# Patient Record
Sex: Male | Born: 1960 | Race: White | Hispanic: No | Smoking: Former smoker
Health system: Southern US, Community
[De-identification: ages and names within clinical notes are randomized; demographics above are authoritative.]

## PROBLEM LIST (undated history)

## (undated) DIAGNOSIS — G35 Multiple sclerosis: Secondary | ICD-10-CM

## (undated) DIAGNOSIS — G8250 Quadriplegia, unspecified: Secondary | ICD-10-CM

## (undated) DIAGNOSIS — F32A Depression, unspecified: Secondary | ICD-10-CM

## (undated) DIAGNOSIS — J189 Pneumonia, unspecified organism: Secondary | ICD-10-CM

## (undated) DIAGNOSIS — R339 Retention of urine, unspecified: Secondary | ICD-10-CM

## (undated) DIAGNOSIS — M549 Dorsalgia, unspecified: Secondary | ICD-10-CM

## (undated) DIAGNOSIS — R32 Unspecified urinary incontinence: Secondary | ICD-10-CM

## (undated) DIAGNOSIS — F329 Major depressive disorder, single episode, unspecified: Secondary | ICD-10-CM

## (undated) DIAGNOSIS — Z960 Presence of urogenital implants: Secondary | ICD-10-CM

## (undated) DIAGNOSIS — E785 Hyperlipidemia, unspecified: Secondary | ICD-10-CM

## (undated) DIAGNOSIS — Z87442 Personal history of urinary calculi: Secondary | ICD-10-CM

## (undated) DIAGNOSIS — R5382 Chronic fatigue, unspecified: Secondary | ICD-10-CM

## (undated) DIAGNOSIS — K592 Neurogenic bowel, not elsewhere classified: Secondary | ICD-10-CM

## (undated) DIAGNOSIS — B379 Candidiasis, unspecified: Secondary | ICD-10-CM

## (undated) DIAGNOSIS — F419 Anxiety disorder, unspecified: Secondary | ICD-10-CM

## (undated) DIAGNOSIS — E039 Hypothyroidism, unspecified: Secondary | ICD-10-CM

## (undated) DIAGNOSIS — I10 Essential (primary) hypertension: Secondary | ICD-10-CM

## (undated) DIAGNOSIS — Z978 Presence of other specified devices: Secondary | ICD-10-CM

## (undated) DIAGNOSIS — N210 Calculus in bladder: Secondary | ICD-10-CM

## (undated) DIAGNOSIS — R269 Unspecified abnormalities of gait and mobility: Secondary | ICD-10-CM

## (undated) DIAGNOSIS — K219 Gastro-esophageal reflux disease without esophagitis: Secondary | ICD-10-CM

## (undated) DIAGNOSIS — N319 Neuromuscular dysfunction of bladder, unspecified: Secondary | ICD-10-CM

## (undated) DIAGNOSIS — G8929 Other chronic pain: Secondary | ICD-10-CM

## (undated) HISTORY — PX: APPENDECTOMY: SHX54

## (undated) HISTORY — DX: Unspecified abnormalities of gait and mobility: R26.9

## (undated) HISTORY — DX: Gastro-esophageal reflux disease without esophagitis: K21.9

## (undated) HISTORY — DX: Multiple sclerosis: G35

## (undated) HISTORY — DX: Essential (primary) hypertension: I10

## (undated) HISTORY — DX: Quadriplegia, unspecified: G82.50

## (undated) HISTORY — DX: Hypothyroidism, unspecified: E03.9

## (undated) HISTORY — DX: Unspecified urinary incontinence: R32

---

## 1994-09-02 HISTORY — PX: NASAL SEPTUM SURGERY: SHX37

## 1994-09-02 HISTORY — PX: OTHER SURGICAL HISTORY: SHX169

## 2003-06-08 ENCOUNTER — Encounter: Payer: Self-pay | Admitting: Cardiology

## 2003-06-08 ENCOUNTER — Ambulatory Visit: Admission: RE | Admit: 2003-06-08 | Discharge: 2003-06-08 | Payer: Self-pay | Admitting: Neurology

## 2003-06-27 ENCOUNTER — Ambulatory Visit (HOSPITAL_COMMUNITY): Admission: RE | Admit: 2003-06-27 | Discharge: 2003-06-27 | Payer: Self-pay | Admitting: Neurology

## 2003-06-28 ENCOUNTER — Ambulatory Visit (HOSPITAL_COMMUNITY): Admission: RE | Admit: 2003-06-28 | Discharge: 2003-06-28 | Payer: Self-pay | Admitting: Neurology

## 2003-09-29 ENCOUNTER — Ambulatory Visit (HOSPITAL_COMMUNITY): Admission: RE | Admit: 2003-09-29 | Discharge: 2003-09-29 | Payer: Self-pay | Admitting: Neurology

## 2003-12-22 ENCOUNTER — Encounter (HOSPITAL_COMMUNITY): Admission: RE | Admit: 2003-12-22 | Discharge: 2004-03-21 | Payer: Self-pay | Admitting: Neurology

## 2004-01-19 ENCOUNTER — Encounter: Admission: RE | Admit: 2004-01-19 | Discharge: 2004-04-18 | Payer: Self-pay | Admitting: Neurology

## 2004-02-07 ENCOUNTER — Ambulatory Visit: Admission: RE | Admit: 2004-02-07 | Discharge: 2004-02-07 | Payer: Self-pay | Admitting: Neurology

## 2004-02-07 ENCOUNTER — Encounter (INDEPENDENT_AMBULATORY_CARE_PROVIDER_SITE_OTHER): Payer: Self-pay | Admitting: Cardiology

## 2004-02-07 HISTORY — PX: TRANSTHORACIC ECHOCARDIOGRAM: SHX275

## 2006-06-20 ENCOUNTER — Encounter: Admission: RE | Admit: 2006-06-20 | Discharge: 2006-06-20 | Payer: Self-pay | Admitting: Neurology

## 2006-06-21 ENCOUNTER — Encounter: Admission: RE | Admit: 2006-06-21 | Discharge: 2006-06-21 | Payer: Self-pay | Admitting: Neurology

## 2006-06-30 ENCOUNTER — Encounter (HOSPITAL_COMMUNITY): Admission: RE | Admit: 2006-06-30 | Discharge: 2006-09-28 | Payer: Self-pay | Admitting: Neurology

## 2006-10-16 ENCOUNTER — Encounter (HOSPITAL_COMMUNITY): Admission: RE | Admit: 2006-10-16 | Discharge: 2007-01-14 | Payer: Self-pay | Admitting: Neurology

## 2007-02-06 ENCOUNTER — Encounter (HOSPITAL_COMMUNITY): Admission: RE | Admit: 2007-02-06 | Discharge: 2007-05-07 | Payer: Self-pay | Admitting: Neurology

## 2007-06-02 ENCOUNTER — Encounter (HOSPITAL_COMMUNITY): Admission: RE | Admit: 2007-06-02 | Discharge: 2007-08-31 | Payer: Self-pay | Admitting: Neurology

## 2007-09-24 ENCOUNTER — Encounter (HOSPITAL_COMMUNITY): Admission: RE | Admit: 2007-09-24 | Discharge: 2007-12-23 | Payer: Self-pay | Admitting: Neurology

## 2008-01-15 ENCOUNTER — Encounter (HOSPITAL_COMMUNITY): Admission: RE | Admit: 2008-01-15 | Discharge: 2008-04-14 | Payer: Self-pay | Admitting: Neurology

## 2008-05-10 ENCOUNTER — Encounter (HOSPITAL_COMMUNITY): Admission: RE | Admit: 2008-05-10 | Discharge: 2008-08-08 | Payer: Self-pay | Admitting: Neurology

## 2008-08-09 ENCOUNTER — Encounter (HOSPITAL_COMMUNITY): Admission: RE | Admit: 2008-08-09 | Discharge: 2008-11-07 | Payer: Self-pay | Admitting: Neurology

## 2008-11-09 ENCOUNTER — Encounter (HOSPITAL_COMMUNITY): Admission: RE | Admit: 2008-11-09 | Discharge: 2009-02-07 | Payer: Self-pay | Admitting: Neurology

## 2008-11-16 ENCOUNTER — Encounter: Admission: RE | Admit: 2008-11-16 | Discharge: 2008-11-16 | Payer: Self-pay | Admitting: Neurology

## 2009-02-28 ENCOUNTER — Encounter (HOSPITAL_COMMUNITY): Admission: RE | Admit: 2009-02-28 | Discharge: 2009-05-29 | Payer: Self-pay | Admitting: Neurology

## 2009-06-20 ENCOUNTER — Encounter (HOSPITAL_COMMUNITY): Admission: RE | Admit: 2009-06-20 | Discharge: 2009-09-01 | Payer: Self-pay | Admitting: Neurology

## 2009-09-12 ENCOUNTER — Encounter (HOSPITAL_COMMUNITY): Admission: RE | Admit: 2009-09-12 | Discharge: 2009-12-11 | Payer: Self-pay | Admitting: Neurology

## 2009-11-20 ENCOUNTER — Encounter: Admission: RE | Admit: 2009-11-20 | Discharge: 2009-11-20 | Payer: Self-pay | Admitting: Neurology

## 2010-01-02 ENCOUNTER — Encounter (HOSPITAL_COMMUNITY): Admission: RE | Admit: 2010-01-02 | Discharge: 2010-03-27 | Payer: Self-pay | Admitting: Neurology

## 2010-03-27 ENCOUNTER — Encounter (HOSPITAL_COMMUNITY): Admission: RE | Admit: 2010-03-27 | Discharge: 2010-06-25 | Payer: Self-pay | Admitting: Neurology

## 2010-06-26 ENCOUNTER — Encounter (HOSPITAL_COMMUNITY)
Admission: RE | Admit: 2010-06-26 | Discharge: 2010-09-24 | Payer: Self-pay | Source: Home / Self Care | Attending: Neurology | Admitting: Neurology

## 2010-07-11 ENCOUNTER — Encounter: Admission: RE | Admit: 2010-07-11 | Discharge: 2010-07-11 | Payer: Self-pay | Admitting: Neurology

## 2010-10-17 ENCOUNTER — Ambulatory Visit (HOSPITAL_COMMUNITY)
Admission: RE | Admit: 2010-10-17 | Discharge: 2010-10-17 | Disposition: A | Discharge status: Home / Self Care | Payer: Medicare Other | Source: Ambulatory Visit | Attending: Neurology | Admitting: Neurology

## 2010-10-17 DIAGNOSIS — G35 Multiple sclerosis: Secondary | ICD-10-CM | POA: Insufficient documentation

## 2010-11-14 ENCOUNTER — Encounter (HOSPITAL_COMMUNITY)
Admission: RE | Admit: 2010-11-14 | Discharge: 2010-11-14 | Disposition: A | Discharge status: Home / Self Care | Payer: Medicare Other | Source: Ambulatory Visit | Attending: Neurology | Admitting: Neurology

## 2010-11-14 DIAGNOSIS — G35 Multiple sclerosis: Secondary | ICD-10-CM | POA: Insufficient documentation

## 2010-12-12 ENCOUNTER — Encounter (HOSPITAL_COMMUNITY): Payer: Medicare Other | Attending: Neurology

## 2010-12-12 DIAGNOSIS — G35 Multiple sclerosis: Secondary | ICD-10-CM | POA: Insufficient documentation

## 2011-01-09 ENCOUNTER — Encounter (HOSPITAL_COMMUNITY)
Admission: RE | Admit: 2011-01-09 | Discharge: 2011-01-09 | Disposition: A | Discharge status: Home / Self Care | Payer: Medicare Other | Source: Ambulatory Visit | Attending: Neurology | Admitting: Neurology

## 2011-01-09 DIAGNOSIS — G35 Multiple sclerosis: Secondary | ICD-10-CM | POA: Insufficient documentation

## 2011-02-06 ENCOUNTER — Encounter (HOSPITAL_COMMUNITY)
Admission: RE | Admit: 2011-02-06 | Discharge: 2011-02-06 | Disposition: A | Discharge status: Home / Self Care | Payer: Medicare Other | Source: Ambulatory Visit | Attending: Neurology | Admitting: Neurology

## 2011-02-06 DIAGNOSIS — G35 Multiple sclerosis: Secondary | ICD-10-CM | POA: Insufficient documentation

## 2011-03-07 ENCOUNTER — Encounter (HOSPITAL_COMMUNITY)
Admission: RE | Admit: 2011-03-07 | Discharge: 2011-03-07 | Disposition: A | Discharge status: Home / Self Care | Payer: Medicare Other | Source: Ambulatory Visit | Attending: Neurology | Admitting: Neurology

## 2011-03-07 DIAGNOSIS — G35 Multiple sclerosis: Secondary | ICD-10-CM | POA: Insufficient documentation

## 2011-04-10 ENCOUNTER — Encounter (HOSPITAL_COMMUNITY)
Admission: RE | Admit: 2011-04-10 | Discharge: 2011-04-10 | Disposition: A | Discharge status: Home / Self Care | Payer: Medicare Other | Source: Ambulatory Visit | Attending: Neurology | Admitting: Neurology

## 2011-04-10 DIAGNOSIS — G35 Multiple sclerosis: Secondary | ICD-10-CM | POA: Insufficient documentation

## 2011-05-08 ENCOUNTER — Encounter (HOSPITAL_COMMUNITY)
Admission: RE | Admit: 2011-05-08 | Discharge: 2011-05-08 | Disposition: A | Discharge status: Home / Self Care | Payer: Medicare Other | Source: Ambulatory Visit | Attending: Neurology | Admitting: Neurology

## 2011-05-08 DIAGNOSIS — G35 Multiple sclerosis: Secondary | ICD-10-CM | POA: Insufficient documentation

## 2011-06-05 ENCOUNTER — Encounter (HOSPITAL_COMMUNITY)
Admission: RE | Admit: 2011-06-05 | Discharge: 2011-06-05 | Disposition: A | Discharge status: Home / Self Care | Payer: Medicare Other | Source: Ambulatory Visit | Attending: Neurology | Admitting: Neurology

## 2011-06-05 DIAGNOSIS — G35 Multiple sclerosis: Secondary | ICD-10-CM | POA: Insufficient documentation

## 2011-07-03 ENCOUNTER — Encounter (HOSPITAL_COMMUNITY)
Admission: RE | Admit: 2011-07-03 | Discharge: 2011-07-03 | Payer: Medicare Other | Source: Ambulatory Visit | Attending: Neurology | Admitting: Neurology

## 2011-07-26 ENCOUNTER — Other Ambulatory Visit (HOSPITAL_COMMUNITY): Payer: Self-pay | Admitting: *Deleted

## 2011-07-26 MED ORDER — SODIUM CHLORIDE 0.9 % IV SOLN: INTRAVENOUS | Status: DC

## 2011-07-29 ENCOUNTER — Other Ambulatory Visit: Payer: Self-pay | Admitting: Neurology

## 2011-07-30 ENCOUNTER — Other Ambulatory Visit (HOSPITAL_COMMUNITY): Payer: Self-pay | Admitting: *Deleted

## 2011-07-30 ENCOUNTER — Encounter (HOSPITAL_COMMUNITY): Payer: Self-pay | Admitting: *Deleted

## 2011-07-30 MED ORDER — SODIUM CHLORIDE 0.9 % IV SOLN: 300.0000 mg | INTRAVENOUS | Status: DC

## 2011-07-30 MED ORDER — EPINEPHRINE 0.3 MG/0.3ML IJ DEVI: 0.3000 mg | INTRAMUSCULAR | Status: DC

## 2011-07-30 MED ORDER — ACETAMINOPHEN 500 MG PO TABS: 1000.0000 mg | ORAL_TABLET | ORAL | Status: DC

## 2011-07-30 MED ORDER — LORATADINE 10 MG PO TABS: 10.0000 mg | ORAL_TABLET | ORAL | Status: DC

## 2011-07-30 MED ORDER — SODIUM CHLORIDE 0.9 % IV SOLN: INTRAVENOUS | Status: DC

## 2011-07-30 MED ORDER — SODIUM CHLORIDE 0.9 % IV SOLN: 500.0000 mg | INTRAVENOUS | Status: DC

## 2011-07-31 ENCOUNTER — Encounter (HOSPITAL_COMMUNITY): Payer: Self-pay

## 2011-07-31 ENCOUNTER — Encounter (HOSPITAL_COMMUNITY)
Admission: RE | Admit: 2011-07-31 | Discharge: 2011-07-31 | Disposition: A | Discharge status: Home / Self Care | Payer: Medicare Other | Source: Ambulatory Visit | Attending: Neurology | Admitting: Neurology

## 2011-07-31 DIAGNOSIS — G35 Multiple sclerosis: Secondary | ICD-10-CM | POA: Insufficient documentation

## 2011-07-31 MED ORDER — ACETAMINOPHEN 500 MG PO TABS
1000.0000 mg | ORAL_TABLET | Freq: Once | ORAL | Status: AC
Start: 2011-07-31 — End: 2011-07-31
  Administered 2011-07-31: 1000 mg via ORAL

## 2011-07-31 MED ORDER — SODIUM CHLORIDE 0.9 % IV SOLN
INTRAVENOUS | Status: DC
  Administered 2011-07-31: 10:00:00 via INTRAVENOUS

## 2011-07-31 MED ORDER — SODIUM CHLORIDE 0.9 % IV SOLN
300.0000 mg | INTRAVENOUS | Status: DC
  Administered 2011-07-31: 300 mg via INTRAVENOUS
  Filled 2011-07-31: qty 15

## 2011-07-31 MED ORDER — LORATADINE 10 MG PO TABS
10.0000 mg | ORAL_TABLET | Freq: Once | ORAL | Status: AC
  Administered 2011-07-31: 10 mg via ORAL

## 2011-08-28 ENCOUNTER — Encounter (HOSPITAL_COMMUNITY): Admission: RE | Admit: 2011-08-28 | Payer: Medicare Other | Source: Ambulatory Visit

## 2011-08-30 ENCOUNTER — Encounter (HOSPITAL_COMMUNITY): Payer: Self-pay | Admitting: *Deleted

## 2011-08-30 ENCOUNTER — Other Ambulatory Visit (HOSPITAL_COMMUNITY): Payer: Self-pay | Admitting: *Deleted

## 2011-09-04 ENCOUNTER — Encounter (HOSPITAL_COMMUNITY)
Admission: RE | Admit: 2011-09-04 | Discharge: 2011-09-04 | Disposition: A | Discharge status: Home / Self Care | Payer: Medicare Other | Source: Ambulatory Visit | Attending: Neurology | Admitting: Neurology

## 2011-09-04 ENCOUNTER — Encounter (HOSPITAL_COMMUNITY): Payer: Self-pay

## 2011-09-04 DIAGNOSIS — G35 Multiple sclerosis: Secondary | ICD-10-CM | POA: Insufficient documentation

## 2011-09-04 MED ORDER — SODIUM CHLORIDE 0.9 % IV SOLN
300.0000 mg | INTRAVENOUS | Status: DC
  Administered 2011-09-04: 300 mg via INTRAVENOUS
  Filled 2011-09-04: qty 15

## 2011-09-04 MED ORDER — SODIUM CHLORIDE 0.9 % IV SOLN: INTRAVENOUS | Status: DC

## 2011-09-04 MED ORDER — LORATADINE 10 MG PO TABS
10.0000 mg | ORAL_TABLET | Freq: Once | ORAL | Status: AC
  Administered 2011-09-04: 10 mg via ORAL

## 2011-09-04 MED ORDER — ACETAMINOPHEN 500 MG PO TABS
1000.0000 mg | ORAL_TABLET | Freq: Once | ORAL | Status: AC
  Administered 2011-09-04: 1000 mg via ORAL
  Filled 2011-09-04: qty 2

## 2011-09-25 ENCOUNTER — Encounter (HOSPITAL_COMMUNITY): Payer: Medicare Other

## 2011-10-02 ENCOUNTER — Other Ambulatory Visit: Payer: Self-pay | Admitting: Neurology

## 2011-10-02 ENCOUNTER — Encounter (HOSPITAL_COMMUNITY)
Admission: RE | Admit: 2011-10-02 | Discharge: 2011-10-02 | Disposition: A | Discharge status: Home / Self Care | Payer: Medicare Other | Source: Ambulatory Visit | Attending: Neurology | Admitting: Neurology

## 2011-10-02 ENCOUNTER — Encounter (HOSPITAL_COMMUNITY): Payer: Self-pay

## 2011-10-02 MED ORDER — ACETAMINOPHEN 500 MG PO TABS: 1000.0000 mg | ORAL_TABLET | Freq: Once | ORAL | Status: AC

## 2011-10-02 MED ORDER — SODIUM CHLORIDE 0.9 % IV SOLN
300.0000 mg | INTRAVENOUS | Status: DC
  Administered 2011-10-02: 300 mg via INTRAVENOUS
  Filled 2011-10-02: qty 15

## 2011-10-02 MED ORDER — LORATADINE 10 MG PO TABS: 10.0000 mg | ORAL_TABLET | Freq: Once | ORAL | Status: DC

## 2011-10-02 MED ORDER — SODIUM CHLORIDE 0.9 % IV SOLN
INTRAVENOUS | Status: DC
  Administered 2011-10-02: 11:00:00 via INTRAVENOUS

## 2011-10-02 MED ORDER — LORATADINE 10 MG PO TABS
10.0000 mg | ORAL_TABLET | ORAL | Status: AC
  Administered 2011-10-02: 10 mg via ORAL

## 2011-10-02 MED ORDER — ACETAMINOPHEN 500 MG PO TABS
1000.0000 mg | ORAL_TABLET | ORAL | Status: AC
Start: 2011-10-02 — End: 2011-10-02
  Administered 2011-10-02: 1000 mg via ORAL

## 2011-10-23 ENCOUNTER — Encounter (HOSPITAL_COMMUNITY): Payer: Medicare Other

## 2011-10-28 ENCOUNTER — Other Ambulatory Visit: Payer: Self-pay | Admitting: Neurology

## 2011-10-28 MED ORDER — SODIUM CHLORIDE 0.9 % IV SOLN: 500.0000 mg | Freq: Once | INTRAVENOUS | Status: DC

## 2011-10-28 MED ORDER — EPINEPHRINE 0.3 MG/0.3ML IJ DEVI: 0.3000 mg | Freq: Once | INTRAMUSCULAR | Status: DC | PRN

## 2011-10-30 ENCOUNTER — Encounter (HOSPITAL_COMMUNITY)
Admission: RE | Admit: 2011-10-30 | Discharge: 2011-10-30 | Disposition: A | Discharge status: Home / Self Care | Payer: Medicare Other | Source: Ambulatory Visit | Attending: Neurology | Admitting: Neurology

## 2011-10-30 ENCOUNTER — Encounter (HOSPITAL_COMMUNITY): Payer: Self-pay

## 2011-10-30 DIAGNOSIS — G35 Multiple sclerosis: Secondary | ICD-10-CM | POA: Insufficient documentation

## 2011-10-30 MED ORDER — LORATADINE 10 MG PO TABS
10.0000 mg | ORAL_TABLET | ORAL | Status: DC
  Administered 2011-10-30: 10 mg via ORAL

## 2011-10-30 MED ORDER — ACETAMINOPHEN 500 MG PO TABS
1000.0000 mg | ORAL_TABLET | ORAL | Status: DC
  Administered 2011-10-30: 1000 mg via ORAL

## 2011-10-30 MED ORDER — SODIUM CHLORIDE 0.9 % IV SOLN
INTRAVENOUS | Status: DC
  Administered 2011-10-30: 250 mL via INTRAVENOUS

## 2011-10-30 MED ORDER — SODIUM CHLORIDE 0.9 % IV SOLN
300.0000 mg | INTRAVENOUS | Status: DC
  Administered 2011-10-30: 300 mg via INTRAVENOUS
  Filled 2011-10-30: qty 15

## 2011-11-20 ENCOUNTER — Encounter (HOSPITAL_COMMUNITY): Payer: Medicare Other

## 2011-11-26 ENCOUNTER — Other Ambulatory Visit: Payer: Self-pay | Admitting: Neurology

## 2011-11-27 ENCOUNTER — Encounter (HOSPITAL_COMMUNITY): Payer: Self-pay

## 2011-11-27 ENCOUNTER — Encounter (HOSPITAL_COMMUNITY)
Admission: RE | Admit: 2011-11-27 | Discharge: 2011-11-27 | Disposition: A | Discharge status: Home / Self Care | Payer: Medicare Other | Source: Ambulatory Visit | Attending: Neurology | Admitting: Neurology

## 2011-11-27 DIAGNOSIS — G35 Multiple sclerosis: Secondary | ICD-10-CM | POA: Insufficient documentation

## 2011-11-27 MED ORDER — LORATADINE 10 MG PO TABS
10.0000 mg | ORAL_TABLET | ORAL | Status: DC
  Administered 2011-11-27: 10 mg via ORAL

## 2011-11-27 MED ORDER — SODIUM CHLORIDE 0.9 % IV SOLN
INTRAVENOUS | Status: DC
  Administered 2011-11-27: 250 mL via INTRAVENOUS

## 2011-11-27 MED ORDER — ACETAMINOPHEN 500 MG PO TABS
1000.0000 mg | ORAL_TABLET | ORAL | Status: DC
  Administered 2011-11-27: 1000 mg via ORAL

## 2011-11-27 MED ORDER — SODIUM CHLORIDE 0.9 % IV SOLN
300.0000 mg | INTRAVENOUS | Status: DC
  Administered 2011-11-27: 300 mg via INTRAVENOUS
  Filled 2011-11-27: qty 15

## 2011-11-27 NOTE — Discharge Instructions (Signed)
Refer to printed paper for your next appointment. Short Stay Phone # [phone] °Refer to your Tysabri handbook and your MD for any questions or concerns °

## 2011-12-18 ENCOUNTER — Encounter (HOSPITAL_COMMUNITY): Payer: Medicare Other

## 2011-12-25 ENCOUNTER — Encounter (HOSPITAL_COMMUNITY): Payer: Self-pay

## 2011-12-25 ENCOUNTER — Encounter (HOSPITAL_COMMUNITY)
Admission: RE | Admit: 2011-12-25 | Discharge: 2011-12-25 | Disposition: A | Discharge status: Home / Self Care | Payer: Medicare Other | Source: Ambulatory Visit | Attending: Neurology | Admitting: Neurology

## 2011-12-25 DIAGNOSIS — G35 Multiple sclerosis: Secondary | ICD-10-CM | POA: Insufficient documentation

## 2011-12-25 MED ORDER — SODIUM CHLORIDE 0.9 % IV SOLN
INTRAVENOUS | Status: DC
  Administered 2011-12-25: 09:00:00 via INTRAVENOUS

## 2011-12-25 MED ORDER — ACETAMINOPHEN 500 MG PO TABS
1000.0000 mg | ORAL_TABLET | ORAL | Status: DC
  Administered 2011-12-25: 1000 mg via ORAL

## 2011-12-25 MED ORDER — LORATADINE 10 MG PO TABS
10.0000 mg | ORAL_TABLET | ORAL | Status: DC
Start: 2011-12-25 — End: 2011-12-26
  Administered 2011-12-25: 10 mg via ORAL

## 2011-12-25 MED ORDER — SODIUM CHLORIDE 0.9 % IV SOLN
300.0000 mg | INTRAVENOUS | Status: DC
  Administered 2011-12-25: 300 mg via INTRAVENOUS
  Filled 2011-12-25: qty 15

## 2011-12-25 MED ORDER — ACETAMINOPHEN 500 MG PO TABS
ORAL_TABLET | ORAL | Status: AC
  Administered 2011-12-25: 1000 mg via ORAL
  Filled 2011-12-25: qty 2

## 2011-12-25 MED ORDER — LORATADINE 10 MG PO TABS
ORAL_TABLET | ORAL | Status: AC
  Administered 2011-12-25: 10 mg via ORAL
  Filled 2011-12-25: qty 1

## 2011-12-25 NOTE — Discharge Instructions (Signed)
Natalizumab injection What is this medicine? NATALIZUMAB (na ta LIZ you mab) is used to treat relapsing multiple sclerosis. This drug is not a cure. It is also used to treat Crohn's disease. This medicine may be used for other purposes; ask your health care provider or pharmacist if you have questions. What should I tell my health care provider before I take this medicine? They need to know if you have any of these conditions: -immune system problems -progressive multifocal leukoencephalopathy (PML) -an unusual or allergic reaction to natalizumab, other medicines, foods, dyes, or preservatives -pregnant or trying to get pregnant -breast-feeding How should I use this medicine? This medicine is for infusion into a vein. It is given by a health care professional in a hospital or clinic setting. A special MedGuide will be given to you by the pharmacist with each prescription and refill. Be sure to read this information carefully each time. Talk to your pediatrician regarding the use of this medicine in children. This medicine is not approved for use in children. Overdosage: If you think you have taken too much of this medicine contact a poison control center or emergency room at once. NOTE: This medicine is only for you. Do not share this medicine with others. What if I miss a dose? It is important not to miss your dose. Call your doctor or health care professional if you are unable to keep an appointment. What may interact with this medicine? -azathioprine -cyclosporine -interferon -6-mercaptopurine -methotrexate -steroid medicines like prednisone or cortisone -TNF-alpha inhibitors like adalimumab, etanercept, and infliximab -vaccines This list may not describe all possible interactions. Give your health care provider a list of all the medicines, herbs, non-prescription drugs, or dietary supplements you use. Also tell them if you smoke, drink alcohol, or use illegal drugs. Some items may  interact with your medicine. What should I watch for while using this medicine? Your condition will be monitored carefully while you are receiving this medicine. Visit your doctor for regular check ups. Tell your doctor or healthcare professional if your symptoms do not start to get better or if they get worse. Stay away from people who are sick. Call your doctor or health care professional for advice if you get a fever, chills or sore throat, or other symptoms of a cold or flu. Do not treat yourself. In some patients, this medicine may cause a serious brain infection that may cause death. If you have any problems seeing, thinking, speaking, walking, or standing, tell your doctor right away. If you cannot reach your doctor, get urgent medical care. What side effects may I notice from receiving this medicine? Side effects that you should report to your doctor or health care professional as soon as possible: -allergic reactions like skin rash, itching or hives, swelling of the face, lips, or tongue -breathing problems -changes in vision -chest pain -dark urine -depression, feelings of sadness -dizziness -general ill feeling or flu-like symptoms -irregular, missed, or painful menstrual periods -light-colored stools -loss of appetite, nausea -muscle weakness -problems with balance, talking, or walking -right upper belly pain -unusually weak or tired -yellowing of the eyes or skin Side effects that usually do not require medical attention (report to your doctor or health care professional if they continue or are bothersome): -aches, pains -headache -stomach upset -tiredness This list may not describe all possible side effects. Call your doctor for medical advice about side effects. You may report side effects to FDA at 1-800-FDA-1088. Where should I keep my medicine? This drug   is given in a hospital or clinic and will not be stored at home. NOTE: This sheet is a summary. It may not cover  all possible information. If you have questions about this medicine, talk to your doctor, pharmacist, or health care provider.  2012, Elsevier/Gold Standard. (10/08/2008 1:33:21 PM) 

## 2012-01-22 ENCOUNTER — Encounter (HOSPITAL_COMMUNITY)
Admission: RE | Admit: 2012-01-22 | Discharge: 2012-01-22 | Disposition: A | Discharge status: Home / Self Care | Payer: Medicare Other | Source: Ambulatory Visit | Attending: Neurology | Admitting: Neurology

## 2012-01-22 ENCOUNTER — Encounter (HOSPITAL_COMMUNITY): Payer: Self-pay

## 2012-01-22 DIAGNOSIS — G35 Multiple sclerosis: Secondary | ICD-10-CM | POA: Insufficient documentation

## 2012-01-22 MED ORDER — LORATADINE 10 MG PO TABS
10.0000 mg | ORAL_TABLET | ORAL | Status: DC
  Administered 2012-01-22: 10 mg via ORAL

## 2012-01-22 MED ORDER — LORATADINE 10 MG PO TABS
ORAL_TABLET | ORAL | Status: AC
  Administered 2012-01-22: 10 mg via ORAL
  Filled 2012-01-22: qty 1

## 2012-01-22 MED ORDER — ACETAMINOPHEN 500 MG PO TABS
1000.0000 mg | ORAL_TABLET | ORAL | Status: DC
Start: 2012-01-22 — End: 2012-01-23
  Administered 2012-01-22: 975 mg via ORAL

## 2012-01-22 MED ORDER — ACETAMINOPHEN 325 MG PO TABS
ORAL_TABLET | ORAL | Status: AC
  Administered 2012-01-22: 975 mg via ORAL
  Filled 2012-01-22: qty 3

## 2012-01-22 MED ORDER — SODIUM CHLORIDE 0.9 % IV SOLN
INTRAVENOUS | Status: DC
  Administered 2012-01-22: 09:00:00 via INTRAVENOUS

## 2012-01-22 MED ORDER — SODIUM CHLORIDE 0.9 % IV SOLN
300.0000 mg | INTRAVENOUS | Status: DC
  Administered 2012-01-22: 300 mg via INTRAVENOUS
  Filled 2012-01-22: qty 15

## 2012-01-22 NOTE — Discharge Instructions (Signed)
Natalizumab injection What is this medicine? NATALIZUMAB (na ta LIZ you mab) is used to treat relapsing multiple sclerosis. This drug is not a cure. It is also used to treat Crohn's disease. This medicine may be used for other purposes; ask your health care provider or pharmacist if you have questions. What should I tell my health care provider before I take this medicine? They need to know if you have any of these conditions: -immune system problems -progressive multifocal leukoencephalopathy (PML) -an unusual or allergic reaction to natalizumab, other medicines, foods, dyes, or preservatives -pregnant or trying to get pregnant -breast-feeding How should I use this medicine? This medicine is for infusion into a vein. It is given by a health care professional in a hospital or clinic setting. A special MedGuide will be given to you by the pharmacist with each prescription and refill. Be sure to read this information carefully each time. Talk to your pediatrician regarding the use of this medicine in children. This medicine is not approved for use in children. Overdosage: If you think you have taken too much of this medicine contact a poison control center or emergency room at once. NOTE: This medicine is only for you. Do not share this medicine with others. What if I miss a dose? It is important not to miss your dose. Call your doctor or health care professional if you are unable to keep an appointment. What may interact with this medicine? -azathioprine -cyclosporine -interferon -6-mercaptopurine -methotrexate -steroid medicines like prednisone or cortisone -TNF-alpha inhibitors like adalimumab, etanercept, and infliximab -vaccines This list may not describe all possible interactions. Give your health care provider a list of all the medicines, herbs, non-prescription drugs, or dietary supplements you use. Also tell them if you smoke, drink alcohol, or use illegal drugs. Some items may  interact with your medicine. What should I watch for while using this medicine? Your condition will be monitored carefully while you are receiving this medicine. Visit your doctor for regular check ups. Tell your doctor or healthcare professional if your symptoms do not start to get better or if they get worse. Stay away from people who are sick. Call your doctor or health care professional for advice if you get a fever, chills or sore throat, or other symptoms of a cold or flu. Do not treat yourself. In some patients, this medicine may cause a serious brain infection that may cause death. If you have any problems seeing, thinking, speaking, walking, or standing, tell your doctor right away. If you cannot reach your doctor, get urgent medical care. What side effects may I notice from receiving this medicine? Side effects that you should report to your doctor or health care professional as soon as possible: -allergic reactions like skin rash, itching or hives, swelling of the face, lips, or tongue -breathing problems -changes in vision -chest pain -dark urine -depression, feelings of sadness -dizziness -general ill feeling or flu-like symptoms -irregular, missed, or painful menstrual periods -light-colored stools -loss of appetite, nausea -muscle weakness -problems with balance, talking, or walking -right upper belly pain -unusually weak or tired -yellowing of the eyes or skin Side effects that usually do not require medical attention (report to your doctor or health care professional if they continue or are bothersome): -aches, pains -headache -stomach upset -tiredness This list may not describe all possible side effects. Call your doctor for medical advice about side effects. You may report side effects to FDA at 1-800-FDA-1088. Where should I keep my medicine? This drug   is given in a hospital or clinic and will not be stored at home. NOTE: This sheet is a summary. It may not cover  all possible information. If you have questions about this medicine, talk to your doctor, pharmacist, or health care provider.  2012, Elsevier/Gold Standard. (10/08/2008 1:33:21 PM) 

## 2012-02-18 ENCOUNTER — Other Ambulatory Visit: Payer: Self-pay | Admitting: Neurology

## 2012-02-18 MED ORDER — EPINEPHRINE 0.3 MG/0.3ML IJ DEVI
0.3000 mg | Freq: Once | INTRAMUSCULAR | Status: DC | PRN
  Filled 2012-02-18: qty 0.6

## 2012-02-18 MED ORDER — SODIUM CHLORIDE 0.9 % IV SOLN: 500.0000 mg | Freq: Once | INTRAVENOUS | Status: DC | PRN

## 2012-02-19 ENCOUNTER — Encounter (HOSPITAL_COMMUNITY): Payer: Self-pay

## 2012-02-19 ENCOUNTER — Encounter (HOSPITAL_COMMUNITY)
Admission: RE | Admit: 2012-02-19 | Discharge: 2012-02-19 | Disposition: A | Discharge status: Home / Self Care | Payer: Medicare Other | Source: Ambulatory Visit | Attending: Neurology | Admitting: Neurology

## 2012-02-19 DIAGNOSIS — G35 Multiple sclerosis: Secondary | ICD-10-CM | POA: Insufficient documentation

## 2012-02-19 HISTORY — DX: Dorsalgia, unspecified: M54.9

## 2012-02-19 HISTORY — DX: Other chronic pain: G89.29

## 2012-02-19 MED ORDER — LORATADINE 10 MG PO TABS
10.0000 mg | ORAL_TABLET | ORAL | Status: DC
  Administered 2012-02-19: 10 mg via ORAL

## 2012-02-19 MED ORDER — NATALIZUMAB 300 MG/15ML IV CONC
300.0000 mg | INTRAVENOUS | Status: DC
  Administered 2012-02-19: 300 mg via INTRAVENOUS
  Filled 2012-02-19: qty 15

## 2012-02-19 MED ORDER — LORATADINE 10 MG PO TABS
ORAL_TABLET | ORAL | Status: AC
  Administered 2012-02-19: 10 mg via ORAL
  Filled 2012-02-19: qty 1

## 2012-02-19 MED ORDER — ACETAMINOPHEN 500 MG PO TABS
1000.0000 mg | ORAL_TABLET | ORAL | Status: DC
  Administered 2012-02-19: 1000 mg via ORAL

## 2012-02-19 MED ORDER — SODIUM CHLORIDE 0.9 % IV SOLN
INTRAVENOUS | Status: DC
  Administered 2012-02-19: 09:00:00 via INTRAVENOUS

## 2012-02-19 MED ORDER — ACETAMINOPHEN 500 MG PO TABS
ORAL_TABLET | ORAL | Status: AC
  Administered 2012-02-19: 1000 mg via ORAL
  Filled 2012-02-19: qty 2

## 2012-02-19 NOTE — Discharge Instructions (Signed)
Call MD for any problems 

## 2012-03-18 ENCOUNTER — Encounter (HOSPITAL_COMMUNITY): Payer: Self-pay

## 2012-03-18 ENCOUNTER — Encounter (HOSPITAL_COMMUNITY)
Admission: RE | Admit: 2012-03-18 | Discharge: 2012-03-18 | Disposition: A | Discharge status: Home / Self Care | Payer: Medicare Other | Source: Ambulatory Visit | Attending: Neurology | Admitting: Neurology

## 2012-03-18 DIAGNOSIS — G35 Multiple sclerosis: Secondary | ICD-10-CM | POA: Insufficient documentation

## 2012-03-18 MED ORDER — ACETAMINOPHEN 500 MG PO TABS
1000.0000 mg | ORAL_TABLET | ORAL | Status: DC
  Administered 2012-03-18: 1000 mg via ORAL

## 2012-03-18 MED ORDER — SODIUM CHLORIDE 0.9 % IV SOLN
INTRAVENOUS | Status: DC
  Administered 2012-03-18: 09:00:00 via INTRAVENOUS

## 2012-03-18 MED ORDER — ACETAMINOPHEN 500 MG PO TABS
ORAL_TABLET | ORAL | Status: AC
  Administered 2012-03-18: 1000 mg via ORAL
  Filled 2012-03-18: qty 2

## 2012-03-18 MED ORDER — LORATADINE 10 MG PO TABS
10.0000 mg | ORAL_TABLET | ORAL | Status: DC
  Administered 2012-03-18: 10 mg via ORAL

## 2012-03-18 MED ORDER — NATALIZUMAB 300 MG/15ML IV CONC
300.0000 mg | INTRAVENOUS | Status: DC
  Administered 2012-03-18: 300 mg via INTRAVENOUS
  Filled 2012-03-18: qty 15

## 2012-03-18 MED ORDER — LORATADINE 10 MG PO TABS
ORAL_TABLET | ORAL | Status: AC
  Administered 2012-03-18: 10 mg via ORAL
  Filled 2012-03-18: qty 1

## 2012-04-15 ENCOUNTER — Encounter (HOSPITAL_COMMUNITY): Payer: Self-pay

## 2012-04-15 ENCOUNTER — Encounter (HOSPITAL_COMMUNITY)
Admission: RE | Admit: 2012-04-15 | Discharge: 2012-04-15 | Disposition: A | Discharge status: Home / Self Care | Payer: Medicare Other | Source: Ambulatory Visit | Attending: Neurology | Admitting: Neurology

## 2012-04-15 DIAGNOSIS — G35 Multiple sclerosis: Secondary | ICD-10-CM | POA: Insufficient documentation

## 2012-04-15 MED ORDER — SODIUM CHLORIDE 0.9 % IV SOLN
300.0000 mg | INTRAVENOUS | Status: DC
  Administered 2012-04-15: 300 mg via INTRAVENOUS
  Filled 2012-04-15: qty 15

## 2012-04-15 MED ORDER — LORATADINE 10 MG PO TABS
10.0000 mg | ORAL_TABLET | ORAL | Status: AC
  Administered 2012-04-15: 10 mg via ORAL
  Filled 2012-04-15: qty 1

## 2012-04-15 MED ORDER — ACETAMINOPHEN 500 MG PO TABS
1000.0000 mg | ORAL_TABLET | ORAL | Status: AC
  Administered 2012-04-15: 1000 mg via ORAL
  Filled 2012-04-15: qty 2

## 2012-04-15 MED ORDER — SODIUM CHLORIDE 0.9 % IV SOLN
INTRAVENOUS | Status: DC
  Administered 2012-04-15: 250 mL via INTRAVENOUS

## 2012-05-13 ENCOUNTER — Encounter (HOSPITAL_COMMUNITY): Admission: RE | Admit: 2012-05-13 | Payer: Medicare Other | Source: Ambulatory Visit

## 2012-05-20 ENCOUNTER — Encounter (HOSPITAL_COMMUNITY)
Admission: RE | Admit: 2012-05-20 | Discharge: 2012-05-20 | Disposition: A | Discharge status: Home / Self Care | Payer: Medicare Other | Source: Ambulatory Visit | Attending: Neurology | Admitting: Neurology

## 2012-05-20 ENCOUNTER — Encounter (HOSPITAL_COMMUNITY): Payer: Self-pay

## 2012-05-20 DIAGNOSIS — G35 Multiple sclerosis: Secondary | ICD-10-CM | POA: Insufficient documentation

## 2012-05-20 MED ORDER — SODIUM CHLORIDE 0.9 % IV SOLN
INTRAVENOUS | Status: DC
  Administered 2012-05-20: 09:00:00 via INTRAVENOUS

## 2012-05-20 MED ORDER — LORATADINE 10 MG PO TABS
10.0000 mg | ORAL_TABLET | ORAL | Status: AC
  Administered 2012-05-20: 10 mg via ORAL
  Filled 2012-05-20: qty 1

## 2012-05-20 MED ORDER — SODIUM CHLORIDE 0.9 % IV SOLN
300.0000 mg | INTRAVENOUS | Status: DC
  Administered 2012-05-20: 300 mg via INTRAVENOUS
  Filled 2012-05-20: qty 15

## 2012-05-20 MED ORDER — ACETAMINOPHEN 500 MG PO TABS
1000.0000 mg | ORAL_TABLET | ORAL | Status: AC
  Administered 2012-05-20: 1000 mg via ORAL
  Filled 2012-05-20: qty 2

## 2012-06-10 ENCOUNTER — Encounter (HOSPITAL_COMMUNITY): Payer: Medicare Other

## 2012-06-16 ENCOUNTER — Other Ambulatory Visit: Payer: Self-pay | Admitting: Neurology

## 2012-06-17 ENCOUNTER — Encounter (HOSPITAL_COMMUNITY): Payer: PRIVATE HEALTH INSURANCE

## 2012-06-17 ENCOUNTER — Encounter (HOSPITAL_COMMUNITY)
Admission: RE | Admit: 2012-06-17 | Discharge: 2012-06-17 | Disposition: A | Discharge status: Home / Self Care | Payer: Medicare Other | Source: Ambulatory Visit | Attending: Neurology | Admitting: Neurology

## 2012-06-17 ENCOUNTER — Encounter (HOSPITAL_COMMUNITY): Payer: Self-pay

## 2012-06-17 DIAGNOSIS — G35 Multiple sclerosis: Secondary | ICD-10-CM | POA: Insufficient documentation

## 2012-06-17 MED ORDER — ACETAMINOPHEN 325 MG PO TABS
650.0000 mg | ORAL_TABLET | ORAL | Status: DC
  Administered 2012-06-17: 650 mg via ORAL
  Filled 2012-06-17: qty 2

## 2012-06-17 MED ORDER — SODIUM CHLORIDE 0.9 % IV SOLN
300.0000 mg | INTRAVENOUS | Status: DC
  Administered 2012-06-17: 300 mg via INTRAVENOUS
  Filled 2012-06-17: qty 15

## 2012-06-17 MED ORDER — SODIUM CHLORIDE 0.9 % IV SOLN
INTRAVENOUS | Status: DC
  Administered 2012-06-17: 09:00:00 via INTRAVENOUS

## 2012-06-17 MED ORDER — LORATADINE 10 MG PO TABS
10.0000 mg | ORAL_TABLET | ORAL | Status: DC
  Administered 2012-06-17: 10 mg via ORAL
  Filled 2012-06-17: qty 1

## 2012-07-14 ENCOUNTER — Other Ambulatory Visit: Payer: Self-pay | Admitting: Neurology

## 2012-07-15 ENCOUNTER — Encounter (HOSPITAL_COMMUNITY)
Admission: RE | Admit: 2012-07-15 | Discharge: 2012-07-15 | Disposition: A | Discharge status: Home / Self Care | Payer: Medicare Other | Source: Ambulatory Visit | Attending: Neurology | Admitting: Neurology

## 2012-07-15 DIAGNOSIS — G35 Multiple sclerosis: Secondary | ICD-10-CM | POA: Insufficient documentation

## 2012-07-15 MED ORDER — SODIUM CHLORIDE 0.9 % IV SOLN
INTRAVENOUS | Status: DC
  Administered 2012-07-15: 09:00:00 via INTRAVENOUS

## 2012-07-15 MED ORDER — SODIUM CHLORIDE 0.9 % IV SOLN
300.0000 mg | INTRAVENOUS | Status: DC
  Administered 2012-07-15: 300 mg via INTRAVENOUS
  Filled 2012-07-15: qty 15

## 2012-07-15 MED ORDER — ACETAMINOPHEN 325 MG PO TABS
650.0000 mg | ORAL_TABLET | ORAL | Status: DC
  Administered 2012-07-15: 650 mg via ORAL
  Filled 2012-07-15: qty 2

## 2012-07-15 MED ORDER — LORATADINE 10 MG PO TABS
10.0000 mg | ORAL_TABLET | ORAL | Status: DC
  Administered 2012-07-15: 10 mg via ORAL
  Filled 2012-07-15: qty 1

## 2012-08-12 ENCOUNTER — Encounter (HOSPITAL_COMMUNITY)
Admission: RE | Admit: 2012-08-12 | Discharge: 2012-08-12 | Disposition: A | Discharge status: Home / Self Care | Payer: Medicare Other | Source: Ambulatory Visit | Attending: Neurology | Admitting: Neurology

## 2012-08-12 ENCOUNTER — Encounter (HOSPITAL_COMMUNITY): Payer: Self-pay

## 2012-08-12 DIAGNOSIS — G35 Multiple sclerosis: Secondary | ICD-10-CM | POA: Insufficient documentation

## 2012-08-12 MED ORDER — ACETAMINOPHEN 325 MG PO TABS
650.0000 mg | ORAL_TABLET | ORAL | Status: DC
  Administered 2012-08-12: 650 mg via ORAL
  Filled 2012-08-12: qty 2

## 2012-08-12 MED ORDER — LORATADINE 10 MG PO TABS
10.0000 mg | ORAL_TABLET | ORAL | Status: DC
  Administered 2012-08-12: 10 mg via ORAL
  Filled 2012-08-12: qty 1

## 2012-08-12 MED ORDER — SODIUM CHLORIDE 0.9 % IV SOLN
300.0000 mg | INTRAVENOUS | Status: DC
  Administered 2012-08-12: 300 mg via INTRAVENOUS
  Filled 2012-08-12: qty 15

## 2012-08-12 MED ORDER — SODIUM CHLORIDE 0.9 % IV SOLN
INTRAVENOUS | Status: DC
  Administered 2012-08-12: 250 mL via INTRAVENOUS

## 2012-09-09 ENCOUNTER — Encounter (HOSPITAL_COMMUNITY)
Admission: RE | Admit: 2012-09-09 | Discharge: 2012-09-09 | Disposition: A | Discharge status: Home / Self Care | Payer: Medicare Other | Source: Ambulatory Visit | Attending: Neurology | Admitting: Neurology

## 2012-09-09 ENCOUNTER — Encounter (HOSPITAL_COMMUNITY): Payer: Self-pay

## 2012-09-09 DIAGNOSIS — G35 Multiple sclerosis: Secondary | ICD-10-CM | POA: Insufficient documentation

## 2012-09-09 MED ORDER — SODIUM CHLORIDE 0.9 % IV SOLN
300.0000 mg | INTRAVENOUS | Status: DC
  Administered 2012-09-09: 300 mg via INTRAVENOUS
  Filled 2012-09-09: qty 15

## 2012-09-09 MED ORDER — ACETAMINOPHEN 325 MG PO TABS
650.0000 mg | ORAL_TABLET | ORAL | Status: AC
  Administered 2012-09-09: 650 mg via ORAL
  Filled 2012-09-09: qty 2

## 2012-09-09 MED ORDER — SODIUM CHLORIDE 0.9 % IV SOLN
INTRAVENOUS | Status: DC
  Administered 2012-09-09: 250 mL via INTRAVENOUS

## 2012-09-09 MED ORDER — LORATADINE 10 MG PO TABS
10.0000 mg | ORAL_TABLET | ORAL | Status: AC
  Administered 2012-09-09: 10 mg via ORAL
  Filled 2012-09-09: qty 1

## 2012-10-07 ENCOUNTER — Encounter (HOSPITAL_COMMUNITY): Admission: RE | Admit: 2012-10-07 | Payer: Medicare Other | Source: Ambulatory Visit

## 2012-10-20 ENCOUNTER — Other Ambulatory Visit: Payer: Self-pay | Admitting: Neurology

## 2012-10-22 ENCOUNTER — Encounter (HOSPITAL_COMMUNITY)
Admission: RE | Admit: 2012-10-22 | Discharge: 2012-10-22 | Disposition: A | Discharge status: Home / Self Care | Payer: Medicare Other | Source: Ambulatory Visit | Attending: Neurology | Admitting: Neurology

## 2012-10-22 ENCOUNTER — Encounter (HOSPITAL_COMMUNITY): Payer: Self-pay

## 2012-10-22 DIAGNOSIS — G35 Multiple sclerosis: Secondary | ICD-10-CM | POA: Insufficient documentation

## 2012-10-22 MED ORDER — LORATADINE 10 MG PO TABS
10.0000 mg | ORAL_TABLET | ORAL | Status: DC
  Administered 2012-10-22: 10 mg via ORAL
  Filled 2012-10-22 (×2): qty 1

## 2012-10-22 MED ORDER — SODIUM CHLORIDE 0.9 % IV SOLN
INTRAVENOUS | Status: DC
  Administered 2012-10-22: 13:00:00 via INTRAVENOUS

## 2012-10-22 MED ORDER — SODIUM CHLORIDE 0.9 % IV SOLN
300.0000 mg | INTRAVENOUS | Status: DC
  Administered 2012-10-22: 300 mg via INTRAVENOUS
  Filled 2012-10-22: qty 15

## 2012-10-22 MED ORDER — ACETAMINOPHEN 325 MG PO TABS
650.0000 mg | ORAL_TABLET | ORAL | Status: DC
  Administered 2012-10-22: 650 mg via ORAL
  Filled 2012-10-22: qty 2

## 2012-10-22 NOTE — Progress Notes (Signed)
[name] called about pt's viral illness two weeks ago. Pt is afebrile. He just feels weaker now. Verbal order received to proceed with tysabri.

## 2012-11-04 ENCOUNTER — Encounter (HOSPITAL_COMMUNITY): Payer: Medicare Other

## 2012-11-19 ENCOUNTER — Telehealth: Payer: Self-pay | Admitting: *Deleted

## 2012-11-19 ENCOUNTER — Encounter (HOSPITAL_COMMUNITY): Admission: RE | Admit: 2012-11-19 | Payer: Medicare Other | Source: Ambulatory Visit

## 2012-11-19 DIAGNOSIS — R197 Diarrhea, unspecified: Secondary | ICD-10-CM

## 2012-11-19 MED ORDER — DIPHENOXYLATE-ATROPINE 2.5-0.025 MG PO TABS: 1.0000 | ORAL_TABLET | Freq: Four times a day (QID) | ORAL | Status: DC | PRN

## 2012-11-19 NOTE — Telephone Encounter (Signed)
Patient called stating he has been having a trouble problem with diarrhea and he can't take the medication that was prescribed because it causes reflux to the point he's vomiting. Patient stated he had to postpone his infusion today.

## 2012-11-19 NOTE — Telephone Encounter (Signed)
I Called the patient. He is unable to tolerate the Questran for the diarrhea. I'll try Lomotil to see if this works better. If not, other medication such as Bentyl might help.

## 2012-11-19 NOTE — Addendum Note (Signed)
Addended by: Stephanie Acre on: 11/19/2012 04:29 PM   Modules accepted: Orders

## 2012-11-27 ENCOUNTER — Encounter (HOSPITAL_COMMUNITY)
Admission: RE | Admit: 2012-11-27 | Discharge: 2012-11-27 | Disposition: A | Discharge status: Home / Self Care | Payer: Medicare Other | Source: Ambulatory Visit | Attending: Neurology | Admitting: Neurology

## 2012-11-27 ENCOUNTER — Encounter (HOSPITAL_COMMUNITY): Payer: Self-pay

## 2012-11-27 DIAGNOSIS — G35 Multiple sclerosis: Secondary | ICD-10-CM | POA: Insufficient documentation

## 2012-11-27 MED ORDER — SODIUM CHLORIDE 0.9 % IV SOLN
300.0000 mg | INTRAVENOUS | Status: DC
  Administered 2012-11-27: 300 mg via INTRAVENOUS
  Filled 2012-11-27: qty 15

## 2012-11-27 MED ORDER — ACETAMINOPHEN 325 MG PO TABS
650.0000 mg | ORAL_TABLET | ORAL | Status: DC
  Administered 2012-11-27: 650 mg via ORAL
  Filled 2012-11-27: qty 2

## 2012-11-27 MED ORDER — SODIUM CHLORIDE 0.9 % IV SOLN
INTRAVENOUS | Status: DC
  Administered 2012-11-27: 14:00:00 via INTRAVENOUS

## 2012-11-27 MED ORDER — LORATADINE 10 MG PO TABS
10.0000 mg | ORAL_TABLET | ORAL | Status: DC
  Administered 2012-11-27: 10 mg via ORAL
  Filled 2012-11-27: qty 1

## 2012-12-17 ENCOUNTER — Encounter (HOSPITAL_COMMUNITY): Payer: Medicare Other

## 2012-12-25 ENCOUNTER — Encounter (HOSPITAL_COMMUNITY)
Admission: RE | Admit: 2012-12-25 | Discharge: 2012-12-25 | Disposition: A | Discharge status: Home / Self Care | Payer: Medicare Other | Source: Ambulatory Visit | Attending: Neurology | Admitting: Neurology

## 2012-12-25 ENCOUNTER — Encounter (HOSPITAL_COMMUNITY): Payer: Self-pay

## 2012-12-25 DIAGNOSIS — G35 Multiple sclerosis: Secondary | ICD-10-CM | POA: Insufficient documentation

## 2012-12-25 MED ORDER — SODIUM CHLORIDE 0.9 % IV SOLN
INTRAVENOUS | Status: DC
  Administered 2012-12-25: 14:00:00 via INTRAVENOUS

## 2012-12-25 MED ORDER — LORATADINE 10 MG PO TABS
10.0000 mg | ORAL_TABLET | ORAL | Status: AC
  Administered 2012-12-25: 10 mg via ORAL
  Filled 2012-12-25: qty 1

## 2012-12-25 MED ORDER — SODIUM CHLORIDE 0.9 % IV SOLN
300.0000 mg | INTRAVENOUS | Status: DC
  Administered 2012-12-25: 300 mg via INTRAVENOUS
  Filled 2012-12-25: qty 15

## 2012-12-25 MED ORDER — ACETAMINOPHEN 325 MG PO TABS
650.0000 mg | ORAL_TABLET | ORAL | Status: AC
  Administered 2012-12-25: 650 mg via ORAL
  Filled 2012-12-25: qty 2

## 2013-01-04 ENCOUNTER — Other Ambulatory Visit: Payer: Self-pay | Admitting: Neurology

## 2013-01-20 ENCOUNTER — Other Ambulatory Visit: Payer: Self-pay | Admitting: Neurology

## 2013-01-20 ENCOUNTER — Telehealth: Payer: Self-pay | Admitting: *Deleted

## 2013-01-20 DIAGNOSIS — G35 Multiple sclerosis: Secondary | ICD-10-CM

## 2013-01-20 NOTE — Telephone Encounter (Signed)
I will place the orders

## 2013-01-20 NOTE — Telephone Encounter (Signed)
Pt will be in on the 01-22-13 for tysabri infusion.   Orders needed please. Thanks

## 2013-01-22 ENCOUNTER — Other Ambulatory Visit (HOSPITAL_COMMUNITY): Payer: Self-pay | Admitting: Neurology

## 2013-01-22 ENCOUNTER — Encounter (HOSPITAL_COMMUNITY)
Admission: RE | Admit: 2013-01-22 | Discharge: 2013-01-22 | Disposition: A | Discharge status: Home / Self Care | Payer: Medicare Other | Source: Ambulatory Visit | Attending: Neurology | Admitting: Neurology

## 2013-01-22 ENCOUNTER — Encounter (HOSPITAL_COMMUNITY): Payer: Self-pay

## 2013-01-22 VITALS — BP 68/47 | HR 86 | Temp 97.0°F | Resp 16

## 2013-01-22 DIAGNOSIS — G35 Multiple sclerosis: Secondary | ICD-10-CM | POA: Insufficient documentation

## 2013-01-22 MED ORDER — SODIUM CHLORIDE 0.9 % IV SOLN
300.0000 mg | INTRAVENOUS | Status: DC
  Administered 2013-01-22: 300 mg via INTRAVENOUS
  Filled 2013-01-22: qty 15

## 2013-01-22 MED ORDER — ACETAMINOPHEN 325 MG PO TABS
650.0000 mg | ORAL_TABLET | ORAL | Status: DC
  Administered 2013-01-22: 650 mg via ORAL
  Filled 2013-01-22: qty 2

## 2013-01-22 MED ORDER — LORATADINE 10 MG PO TABS
10.0000 mg | ORAL_TABLET | ORAL | Status: DC
  Administered 2013-01-22: 10 mg via ORAL
  Filled 2013-01-22: qty 1

## 2013-01-22 MED ORDER — SODIUM CHLORIDE 0.9 % IV SOLN
INTRAVENOUS | Status: DC
  Administered 2013-01-22: 15:00:00 via INTRAVENOUS

## 2013-01-22 NOTE — Progress Notes (Signed)
Patient states he took his 30 mg of valium  on his own for leg spasms. BP low but alert . VSS otherwise

## 2013-02-08 ENCOUNTER — Emergency Department (HOSPITAL_COMMUNITY)
Admission: EM | Admit: 2013-02-08 | Discharge: 2013-02-08 | Disposition: A | Discharge status: Home / Self Care | Payer: Medicare Other | Attending: Emergency Medicine | Admitting: Emergency Medicine

## 2013-02-08 ENCOUNTER — Emergency Department (HOSPITAL_COMMUNITY): Payer: Medicare Other

## 2013-02-08 ENCOUNTER — Encounter (HOSPITAL_COMMUNITY): Payer: Self-pay

## 2013-02-08 ENCOUNTER — Telehealth: Payer: Self-pay | Admitting: Neurology

## 2013-02-08 DIAGNOSIS — G35 Multiple sclerosis: Secondary | ICD-10-CM | POA: Insufficient documentation

## 2013-02-08 DIAGNOSIS — F411 Generalized anxiety disorder: Secondary | ICD-10-CM | POA: Insufficient documentation

## 2013-02-08 DIAGNOSIS — G8929 Other chronic pain: Secondary | ICD-10-CM | POA: Insufficient documentation

## 2013-02-08 DIAGNOSIS — I10 Essential (primary) hypertension: Secondary | ICD-10-CM | POA: Insufficient documentation

## 2013-02-08 DIAGNOSIS — K219 Gastro-esophageal reflux disease without esophagitis: Secondary | ICD-10-CM | POA: Insufficient documentation

## 2013-02-08 DIAGNOSIS — R32 Unspecified urinary incontinence: Secondary | ICD-10-CM | POA: Insufficient documentation

## 2013-02-08 DIAGNOSIS — E7800 Pure hypercholesterolemia, unspecified: Secondary | ICD-10-CM | POA: Insufficient documentation

## 2013-02-08 DIAGNOSIS — E559 Vitamin D deficiency, unspecified: Secondary | ICD-10-CM | POA: Insufficient documentation

## 2013-02-08 DIAGNOSIS — R209 Unspecified disturbances of skin sensation: Secondary | ICD-10-CM | POA: Insufficient documentation

## 2013-02-08 DIAGNOSIS — R4182 Altered mental status, unspecified: Secondary | ICD-10-CM | POA: Insufficient documentation

## 2013-02-08 DIAGNOSIS — R531 Weakness: Secondary | ICD-10-CM

## 2013-02-08 DIAGNOSIS — M549 Dorsalgia, unspecified: Secondary | ICD-10-CM | POA: Insufficient documentation

## 2013-02-08 DIAGNOSIS — Z87891 Personal history of nicotine dependence: Secondary | ICD-10-CM | POA: Insufficient documentation

## 2013-02-08 DIAGNOSIS — E039 Hypothyroidism, unspecified: Secondary | ICD-10-CM | POA: Insufficient documentation

## 2013-02-08 DIAGNOSIS — R630 Anorexia: Secondary | ICD-10-CM | POA: Insufficient documentation

## 2013-02-08 DIAGNOSIS — R4789 Other speech disturbances: Secondary | ICD-10-CM | POA: Insufficient documentation

## 2013-02-08 DIAGNOSIS — Z79899 Other long term (current) drug therapy: Secondary | ICD-10-CM | POA: Insufficient documentation

## 2013-02-08 LAB — CBC
HCT: 32.6 % — ABNORMAL LOW (ref 39.0–52.0)
Hemoglobin: 11.4 g/dL — ABNORMAL LOW (ref 13.0–17.0)
MCH: 30 pg (ref 26.0–34.0)
MCHC: 35 g/dL (ref 30.0–36.0)
MCV: 85.8 fL (ref 78.0–100.0)
Platelets: 193 10*3/uL (ref 150–400)
RBC: 3.8 MIL/uL — ABNORMAL LOW (ref 4.22–5.81)
RDW: 13.6 % (ref 11.5–15.5)
WBC: 10.8 10*3/uL — ABNORMAL HIGH (ref 4.0–10.5)

## 2013-02-08 LAB — COMPREHENSIVE METABOLIC PANEL
ALT: 25 U/L (ref 0–53)
AST: 37 U/L (ref 0–37)
Albumin: 3.9 g/dL (ref 3.5–5.2)
Alkaline Phosphatase: 97 U/L (ref 39–117)
BUN: 54 mg/dL — ABNORMAL HIGH (ref 6–23)
CO2: 25 mEq/L (ref 19–32)
Calcium: 9.7 mg/dL (ref 8.4–10.5)
Chloride: 95 mEq/L — ABNORMAL LOW (ref 96–112)
Creatinine, Ser: 1.88 mg/dL — ABNORMAL HIGH (ref 0.50–1.35)
GFR calc Af Amer: 46 mL/min — ABNORMAL LOW (ref >90)
GFR calc non Af Amer: 39 mL/min — ABNORMAL LOW (ref >90)
Glucose, Bld: 92 mg/dL (ref 70–99)
Potassium: 3.8 mEq/L (ref 3.5–5.1)
Sodium: 133 mEq/L — ABNORMAL LOW (ref 135–145)
Total Bilirubin: 0.3 mg/dL (ref 0.3–1.2)
Total Protein: 7.2 g/dL (ref 6.0–8.3)

## 2013-02-08 LAB — URINE MICROSCOPIC-ADD ON

## 2013-02-08 LAB — URINALYSIS, ROUTINE W REFLEX MICROSCOPIC
Bilirubin Urine: NEGATIVE
Glucose, UA: 250 mg/dL — AB
Ketones, ur: NEGATIVE mg/dL
Leukocytes, UA: NEGATIVE
Nitrite: NEGATIVE
Protein, ur: NEGATIVE mg/dL
Specific Gravity, Urine: 1.02 (ref 1.005–1.030)
Urobilinogen, UA: 0.2 mg/dL (ref 0.0–1.0)
pH: 6.5 (ref 5.0–8.0)

## 2013-02-08 MED ORDER — GADOBENATE DIMEGLUMINE 529 MG/ML IV SOLN
10.0000 mL | Freq: Once | INTRAVENOUS | Status: AC | PRN
  Administered 2013-02-08: 10 mL via INTRAVENOUS

## 2013-02-08 NOTE — ED Provider Notes (Addendum)
History     CSN: [id number]  Arrival date & time 02/08/13  1355   First MD Initiated Contact with Patient 02/08/13 1504      Chief Complaint  Patient presents with  . Weakness  . Anxiety    (Consider location/radiation/quality/duration/timing/severity/associated sxs/prior treatment) HPI Comments: 52 y/o male with a PMHx of MS, HTN, GERD, chronic back pain, hypercholesterolemia and hypothyroidism presents to the ED with his sister and mother with a list of 52 complaints that have been occuring over the past week and a half. Patient tried calling his neurologist today Dr. Anne Hahn, however he was unable to get in touch with him. Complaints include slurred speech, confusion and losing train of thought, unable to straighten his right arm, generalized weakness, decreased appetite, not sleeping well, increased anxiety, and leaving he went who fell even though he hasn't, worsening leg weakness, and sensation of his brain "sloshing around in his head" and "driving his family crazy". He has been unable to walk for the past 5 years. Family states he seems very anxious. If he goes. 4 hours without taking his "cramping pills" his legs curl to the left. States his last head scan was about one year ago, and last visit with the neurologist was in February.  Patient is a 52 y.o. male presenting with weakness and anxiety. The history is provided by the patient, a relative and a parent.  Weakness Associated symptoms include arthralgias, fatigue, headaches, myalgias, nausea and weakness. Pertinent negatives include no chest pain.  Anxiety Associated symptoms include arthralgias, fatigue, headaches, myalgias, nausea and weakness. Pertinent negatives include no chest pain.    Past Medical History  Diagnosis Date  . Multiple sclerosis     1993  . Hypertension   . GERD (gastroesophageal reflux disease)   . Hypercholesteremia   . Incontinence of urine   . Hypothyroidism   . Vitamin D deficiency   . Chronic  back pain   . Neuromuscular disorder     Multiple Scerlosis    Past Surgical History  Procedure Laterality Date  . Appendectomy      age 53    History reviewed. No pertinent family history.  History  Substance Use Topics  . Smoking status: Former Smoker -- 2.00 packs/day    Types: Cigarettes    Quit date: 06/17/1985  . Smokeless tobacco: Former Neurosurgeon    Quit date: 10/01/1984  . Alcohol Use: No      Review of Systems  Constitutional: Positive for appetite change and fatigue.  Respiratory: Negative for shortness of breath.   Cardiovascular: Negative for chest pain.  Gastrointestinal: Positive for nausea.  Genitourinary: Positive for enuresis.  Musculoskeletal: Positive for myalgias and arthralgias.  Neurological: Positive for speech difficulty, weakness and headaches.  Psychiatric/Behavioral: Positive for confusion. The patient is nervous/anxious.   All other systems reviewed and are negative.    Allergies  Review of patient's allergies indicates no known allergies.  Home Medications   Current Outpatient Rx  Name  Route  Sig  Dispense  Refill  . ALPRAZolam (XANAX) 0.5 MG tablet   Oral   Take 0.5 mg by mouth 3 (three) times daily.           . baclofen (LIORESAL) 20 MG tablet      take 2 tablets by mouth three times a day   540 tablet   1   . diazepam (VALIUM) 10 MG tablet   Oral   Take 10 mg by mouth at bedtime as needed.           Marland Kitchen  diphenoxylate-atropine (LOMOTIL) 2.5-0.025 MG per tablet      take 1 tablet four times a day if needed for diarrhea or LOOSE STOOLS.   120 tablet   5     Pharmacy Fax [phone]   . doxepin (SINEQUAN) 100 MG capsule   Oral   Take 100 mg by mouth at bedtime.           Marland Kitchen levothyroxine (SYNTHROID, LEVOTHROID) 100 MCG tablet   Oral   Take 100 mcg by mouth daily.           Marland Kitchen lisinopril-hydrochlorothiazide (PRINZIDE,ZESTORETIC) 10-12.5 MG per tablet   Oral   Take 1 tablet by mouth daily.           . mirabegron  ER (MYRBETRIQ) 25 MG TB24   Oral   Take 25 mg by mouth daily.         . naproxen sodium (ALEVE) 220 MG tablet   Oral   Take 220 mg by mouth as needed.         Marland Kitchen omeprazole (PRILOSEC) 20 MG capsule   Oral   Take 20 mg by mouth daily.           . sertraline (ZOLOFT) 100 MG tablet   Oral   Take 200 mg by mouth daily.          . Tamsulosin HCl (FLOMAX) 0.4 MG CAPS   Oral   Take 0.4 mg by mouth daily after supper.           . terazosin (HYTRIN) 10 MG capsule   Oral   Take 10 mg by mouth daily.           Marland Kitchen tiZANidine (ZANAFLEX) 4 MG tablet   Oral   Take 8 mg by mouth 3 (three) times daily.           . sodium chloride 0.9 % SOLN 100 mL with natalizumab 300 MG/15ML CONC 300 mg   Intravenous   Inject 300 mg into the vein every 28 (twenty-eight) days.           . Vitamin D, Ergocalciferol, (DRISDOL) 50000 UNITS CAPS   Oral   Take 50,000 Units by mouth every 7 (seven) days.             BP 113/52  Pulse 75  Temp(Src) 97.4 F (36.3 C) (Oral)  Resp 17  Ht 6\' 1"  (1.854 m)  Wt 190 lb (86.183 kg)  BMI 25.07 kg/m2  SpO2 96%  Physical Exam  Nursing note and vitals reviewed. Constitutional: He is oriented to person, place, and time. He appears well-developed and well-nourished. No distress.  HENT:  Head: Normocephalic and atraumatic.  Mouth/Throat: Oropharynx is clear and moist.  Eyes: Conjunctivae and EOM are normal. Pupils are equal, round, and reactive to light.  Neck: Normal range of motion. Neck supple.  Cardiovascular: Normal rate, regular rhythm, normal heart sounds and intact distal pulses.   Pulmonary/Chest: Effort normal and breath sounds normal. No respiratory distress.  Abdominal: Soft. Normal appearance and bowel sounds are normal. He exhibits no distension. There is no tenderness.  Musculoskeletal: He exhibits no edema.  Neurological: He is alert and oriented to person, place, and time. He displays no tremor. No cranial nerve deficit or sensory  deficit. Coordination normal.  Non-ambulatory, lower extremities turned towards left with knees bent.   Skin: Skin is warm and dry. He is not diaphoretic.  Psychiatric: His speech is normal and behavior is normal. Thought content normal. His  mood appears anxious. Cognition and memory are normal.    ED Course  Procedures (including critical care time)  Labs Reviewed  CBC - Abnormal; Notable for the following:    WBC 10.8 (*)    RBC 3.80 (*)    Hemoglobin 11.4 (*)    HCT 32.6 (*)    All other components within normal limits  COMPREHENSIVE METABOLIC PANEL - Abnormal; Notable for the following:    Sodium 133 (*)    Chloride 95 (*)    BUN 54 (*)    Creatinine, Ser 1.88 (*)    GFR calc non Af Amer 39 (*)    GFR calc Af Amer 46 (*)    All other components within normal limits  URINALYSIS, ROUTINE W REFLEX MICROSCOPIC - Abnormal; Notable for the following:    Glucose, UA 250 (*)    Hgb urine dipstick MODERATE (*)    All other components within normal limits  URINE MICROSCOPIC-ADD ON   Mr Laqueta Jean Wo Contrast  02/08/2013   *RADIOLOGY REPORT*  Clinical Data: Weakness, anxiety, hypertension, history of multiple sclerosis diagnosed in 1993.  Unable to ambulate.  MRI HEAD WITHOUT AND WITH CONTRAST  Technique:  Multiplanar, multiecho pulse sequences of the brain and surrounding structures were obtained according to standard protocol without and with intravenous contrast  Contrast: 10mL MULTIHANCE GADOBENATE DIMEGLUMINE 529 MG/ML IV SOLN  Comparison: Most recent comparison examination is from11/05/2010.  Findings: No acute infarct.  Similar appearance of white matter type changes probably related to patient's history of multiple sclerosis.  None of these areas demonstrate enhancement or restricted motion as may be seen with areas of active demyelination.  No intracranial hemorrhage.  No intracranial mass or abnormal enhancement.  No hydrocephalus.  Major intracranial vascular structures are patent.   Paranasal sinus mucosal thickening most notable right sphenoid sinus air cell.  Cervical medullary junction, pituitary region, pineal region and orbital structures unremarkable.  IMPRESSION: No acute infarct.  Similar appearance of white matter type changes probably related to patient's history of multiple sclerosis.  None of these areas demonstrate enhancement or restricted motion as may be seen with areas of active demyelination.  Paranasal sinus mucosal thickening most notable right sphenoid sinus air cell.   Original Report Authenticated By: Lacy Duverney, M.D.     1. Weakness   2. Multiple sclerosis       MDM  I spoke with Dr. Pearlean Brownie neurologist on-call for [facility] neurology because Dr. Anne Hahn is not in the office today and discussed the patient's symptoms. He advised an MRI with contrast and agrees to look for any enhancing lesions. If enhancing lesions are present, he suggests giving a dose of Solu-Medrol in the emergency department and he can followup in the office as an outpatient tomorrow. He would like a phone call back once the MRI results are complete if there are any enhancing lesions.  6:40 PM MRI without any enhancement concerning active demyelination. No acute changes. Patient will followup with Dr. Anne Hahn as an outpatient. Patient and family state their understanding of plan and are agreeable.    Trevor Mace, PA-C 02/08/13 1840  Trevor Mace, PA-C 02/21/13 1523

## 2013-02-08 NOTE — ED Notes (Signed)
Per EMS- patient has a history of MS and was unable to walk prior to today. Patient and patient's family wrote a list of complaints that includes 39 complaints or symptoms yesterday. Patient reports that he tried to call his neurologist, but they were at lunch. Patient asked EMS to stay with him until his physician called him back.

## 2013-02-08 NOTE — ED Notes (Signed)
Bed:WA24<BR> Expected date:<BR> Expected time:<BR> Means of arrival:<BR> Comments:<BR>

## 2013-02-08 NOTE — Telephone Encounter (Signed)
Patient is calling to tell us he thinks he's had a stroke x 2 wks ago.  He states he wants to speak with Dr. Anne Hahn asap.  He is at home [phone]

## 2013-02-09 MED ORDER — PREDNISONE 10 MG PO TABS: ORAL_TABLET | ORAL | Status: DC

## 2013-02-09 NOTE — Telephone Encounter (Signed)
Spoke to patient. Says he has been feeling "rough" for the past couple of weeks. Extreme weakness. Visited ED yesterday. [facility] on call Dr. Pearlean Brownie was contacted due to Dr. Anne Hahn being out of the office. See ED notes. Patient would like to know Dr. Anne Hahn next step.

## 2013-02-09 NOTE — Telephone Encounter (Signed)
I called patient. The patient has not felt well for least 2 weeks. The patient has been under stress as he has ordered a lift due to male, and it came damaged, and he cannot get his money back. The patient just recently had a overhead bar set up so we can now pull himself out of bed and get into the shower if he needs 2. I'll get a physical and occupational therapist out to the house. The patient went to the emergency room, and he had a MRI the brain that did not show any acute changes. The patient appears to have elevation in the BUN and creatinine, and the patient may be dehydrated. The patient is to force fluids. I'll get him on a prednisone Dosepak.

## 2013-02-10 ENCOUNTER — Telehealth: Payer: Self-pay | Admitting: *Deleted

## 2013-02-10 NOTE — Telephone Encounter (Signed)
Spoke to patient. Had questions about new steroids prescribed, home health referral, and dental questions. Answered questions accordingly. Pt agreed.

## 2013-02-11 NOTE — ED Provider Notes (Signed)
Medical screening examination/treatment/procedure(s) were performed by non-physician practitioner and as supervising physician I was immediately available for consultation/collaboration.  Tyleah Loh, MD 02/11/13 0102 

## 2013-02-12 ENCOUNTER — Telehealth: Payer: Self-pay | Admitting: Neurology

## 2013-02-12 NOTE — Telephone Encounter (Signed)
Sandy P in referrals taking care of this.

## 2013-02-15 ENCOUNTER — Telehealth: Payer: Self-pay | Admitting: Neurology

## 2013-02-15 NOTE — Telephone Encounter (Signed)
I would not delay the Tysabri because of the steroids.

## 2013-02-15 NOTE — Telephone Encounter (Signed)
Left message asking if it was all right to get his Tysabri infusion on Friday even though he is in the middle of taking steroids.  Does he have to delay infusion?

## 2013-02-19 ENCOUNTER — Encounter (HOSPITAL_COMMUNITY)
Admission: RE | Admit: 2013-02-19 | Discharge: 2013-02-19 | Disposition: A | Discharge status: Home / Self Care | Payer: Medicare Other | Source: Ambulatory Visit | Attending: Neurology | Admitting: Neurology

## 2013-02-19 ENCOUNTER — Encounter (HOSPITAL_COMMUNITY): Payer: Self-pay

## 2013-02-19 VITALS — BP 88/48 | HR 78 | Temp 97.8°F | Resp 20

## 2013-02-19 DIAGNOSIS — G35 Multiple sclerosis: Secondary | ICD-10-CM

## 2013-02-19 MED ORDER — LORATADINE 10 MG PO TABS
10.0000 mg | ORAL_TABLET | ORAL | Status: DC
  Administered 2013-02-19: 10 mg via ORAL
  Filled 2013-02-19: qty 1

## 2013-02-19 MED ORDER — SODIUM CHLORIDE 0.9 % IV SOLN
INTRAVENOUS | Status: DC
  Administered 2013-02-19: 20 mL/h via INTRAVENOUS

## 2013-02-19 MED ORDER — ACETAMINOPHEN 325 MG PO TABS
650.0000 mg | ORAL_TABLET | ORAL | Status: DC
  Administered 2013-02-19: 650 mg via ORAL
  Filled 2013-02-19: qty 2

## 2013-02-19 MED ORDER — SODIUM CHLORIDE 0.9 % IV SOLN
300.0000 mg | INTRAVENOUS | Status: DC
  Administered 2013-02-19: 300 mg via INTRAVENOUS
  Filled 2013-02-19: qty 15

## 2013-02-22 ENCOUNTER — Inpatient Hospital Stay (HOSPITAL_COMMUNITY): Admission: RE | Admit: 2013-02-22 | Payer: Medicare Other | Source: Ambulatory Visit

## 2013-02-25 NOTE — ED Provider Notes (Signed)
Medical screening examination/treatment/procedure(s) were performed by non-physician practitioner and as supervising physician I was immediately available for consultation/collaboration.  Sopheap Basic, MD 02/25/13 0657 

## 2013-02-26 ENCOUNTER — Telehealth: Payer: Self-pay | Admitting: Neurology

## 2013-02-26 DIAGNOSIS — G35 Multiple sclerosis: Secondary | ICD-10-CM

## 2013-02-26 DIAGNOSIS — G8250 Quadriplegia, unspecified: Secondary | ICD-10-CM

## 2013-02-26 NOTE — Telephone Encounter (Signed)
Please call.

## 2013-02-26 NOTE — Telephone Encounter (Signed)
I spoke to patient and he didn't know the name of the medicine that is giving him the diarrhea but said he stopped it and is taking more imodium to make diarrhea more predictable.  Also said the xanax is having an adverse reaction on him and he is waking up in the middle of the night.  I also received a request from his [facility] therapist asking for the status of the hospital bed and hoyer lift requested.  I explained that the doctor has been away it is probably waiting to be signed.

## 2013-02-28 NOTE — Telephone Encounter (Signed)
I called patient and I left a message. If the patient is still having diarrhea, and he is to contact our office. The patient will be given a prescription for the hospital bed and the [facility] lift.

## 2013-03-03 ENCOUNTER — Telehealth: Payer: Self-pay | Admitting: Neurology

## 2013-03-03 DIAGNOSIS — G8250 Quadriplegia, unspecified: Secondary | ICD-10-CM

## 2013-03-03 DIAGNOSIS — G35 Multiple sclerosis: Secondary | ICD-10-CM

## 2013-03-03 NOTE — Telephone Encounter (Signed)
I talked that I already wrote an order for the hospital bed, but I will be happy to rewrite this.

## 2013-03-03 NOTE — Telephone Encounter (Signed)
Can this patient have an order for a hospital bed?

## 2013-03-04 NOTE — Telephone Encounter (Signed)
Given to [facility]. In referrals.

## 2013-03-08 ENCOUNTER — Telehealth: Payer: Self-pay | Admitting: Neurology

## 2013-03-08 DIAGNOSIS — R197 Diarrhea, unspecified: Secondary | ICD-10-CM

## 2013-03-08 MED ORDER — CHOLESTYRAMINE 4 G PO PACK: 1.0000 | PACK | Freq: Two times a day (BID) | ORAL | Status: DC

## 2013-03-08 MED ORDER — CLONAZEPAM 1 MG PO TABS: 1.0000 mg | ORAL_TABLET | Freq: Two times a day (BID) | ORAL | Status: DC

## 2013-03-08 NOTE — Telephone Encounter (Signed)
I called patient. The patient has had several years of alternating constipation and diarrhea which has worsened. The patient has never seen a gastroenterologist for this, I will get this set up. The patient also is having increasing problems with anxiety in the evening. The patient is on alprazolam, but this actually made him feel worse at higher doses. The patient also takes diazepam. We will stop these medications, and use clonazepam. The patient is also on Zoloft.

## 2013-03-08 NOTE — Telephone Encounter (Signed)
Pt is having problems consistently managing his symptoms with current medication regimen.  He has been experiencing increased anxiety and leg cramps and has been unable to regulate by adjusting his medications: Stopped 6 Xanax per day and started taking only 1 or 2 and he was able to sleep for a night or 2 and then anxiety came back.  Having the same problem regulating his bowel movements with his current medication.

## 2013-03-19 ENCOUNTER — Encounter (HOSPITAL_COMMUNITY): Payer: Self-pay

## 2013-03-19 ENCOUNTER — Encounter (HOSPITAL_COMMUNITY)
Admission: RE | Admit: 2013-03-19 | Discharge: 2013-03-19 | Disposition: A | Discharge status: Home / Self Care | Payer: Medicare Other | Source: Ambulatory Visit | Attending: Neurology | Admitting: Neurology

## 2013-03-19 VITALS — BP 127/89 | HR 75 | Temp 97.4°F | Resp 18

## 2013-03-19 DIAGNOSIS — G35 Multiple sclerosis: Secondary | ICD-10-CM | POA: Insufficient documentation

## 2013-03-19 MED ORDER — SODIUM CHLORIDE 0.9 % IV SOLN
300.0000 mg | INTRAVENOUS | Status: DC
  Administered 2013-03-19: 300 mg via INTRAVENOUS
  Filled 2013-03-19: qty 15

## 2013-03-19 MED ORDER — ACETAMINOPHEN 325 MG PO TABS: 650.0000 mg | ORAL_TABLET | ORAL | Status: DC

## 2013-03-19 MED ORDER — LORATADINE 10 MG PO TABS: 10.0000 mg | ORAL_TABLET | ORAL | Status: DC

## 2013-03-19 MED ORDER — ACETAMINOPHEN 325 MG PO TABS
650.0000 mg | ORAL_TABLET | ORAL | Status: AC
  Administered 2013-03-19: 650 mg via ORAL
  Filled 2013-03-19: qty 2

## 2013-03-19 MED ORDER — SODIUM CHLORIDE 0.9 % IV SOLN
INTRAVENOUS | Status: DC
  Administered 2013-03-19: 14:00:00 via INTRAVENOUS

## 2013-03-19 MED ORDER — LORATADINE 10 MG PO TABS
10.0000 mg | ORAL_TABLET | ORAL | Status: AC
  Administered 2013-03-19: 10 mg via ORAL
  Filled 2013-03-19: qty 1

## 2013-04-07 ENCOUNTER — Other Ambulatory Visit: Payer: Self-pay | Admitting: Neurology

## 2013-04-08 NOTE — Telephone Encounter (Signed)
Rx signed and faxed.

## 2013-04-13 ENCOUNTER — Telehealth: Payer: Self-pay | Admitting: Neurology

## 2013-04-13 ENCOUNTER — Encounter: Payer: Self-pay | Admitting: Neurology

## 2013-04-13 NOTE — Telephone Encounter (Signed)
Lincoln Maxin OT with [facility] would like a verbal order for 2 more visits, to make up for 2 missed visits in July.  [phone]

## 2013-04-13 NOTE — Telephone Encounter (Signed)
I called the occupational therapist. Molli Knock for 2 more revisits with occupational therapy. I left a message.

## 2013-04-16 ENCOUNTER — Encounter (HOSPITAL_COMMUNITY): Payer: Self-pay

## 2013-04-16 ENCOUNTER — Encounter (HOSPITAL_COMMUNITY)
Admission: RE | Admit: 2013-04-16 | Discharge: 2013-04-16 | Disposition: A | Discharge status: Home / Self Care | Payer: Medicare Other | Source: Ambulatory Visit | Attending: Neurology | Admitting: Neurology

## 2013-04-16 VITALS — BP 111/64 | HR 56 | Temp 97.8°F | Resp 16

## 2013-04-16 DIAGNOSIS — G35 Multiple sclerosis: Secondary | ICD-10-CM | POA: Insufficient documentation

## 2013-04-16 MED ORDER — LORATADINE 10 MG PO TABS
10.0000 mg | ORAL_TABLET | ORAL | Status: AC
  Administered 2013-04-16: 10 mg via ORAL
  Filled 2013-04-16: qty 1

## 2013-04-16 MED ORDER — SODIUM CHLORIDE 0.9 % IV SOLN
300.0000 mg | INTRAVENOUS | Status: DC
  Administered 2013-04-16: 300 mg via INTRAVENOUS
  Filled 2013-04-16: qty 15

## 2013-04-16 MED ORDER — ACETAMINOPHEN 325 MG PO TABS
650.0000 mg | ORAL_TABLET | ORAL | Status: AC
  Administered 2013-04-16: 650 mg via ORAL
  Filled 2013-04-16: qty 2

## 2013-04-16 MED ORDER — SODIUM CHLORIDE 0.9 % IV SOLN
INTRAVENOUS | Status: AC
  Administered 2013-04-16: 14:00:00 via INTRAVENOUS

## 2013-04-19 ENCOUNTER — Ambulatory Visit (INDEPENDENT_AMBULATORY_CARE_PROVIDER_SITE_OTHER): Payer: Medicare Other | Admitting: Neurology

## 2013-04-19 ENCOUNTER — Encounter: Payer: Self-pay | Admitting: Neurology

## 2013-04-19 VITALS — BP 111/75 | HR 72

## 2013-04-19 DIAGNOSIS — G8114 Spastic hemiplegia affecting left nondominant side: Secondary | ICD-10-CM | POA: Insufficient documentation

## 2013-04-19 DIAGNOSIS — G35 Multiple sclerosis: Secondary | ICD-10-CM

## 2013-04-19 DIAGNOSIS — G8250 Quadriplegia, unspecified: Secondary | ICD-10-CM

## 2013-04-19 DIAGNOSIS — Z5181 Encounter for therapeutic drug level monitoring: Secondary | ICD-10-CM

## 2013-04-19 HISTORY — DX: Quadriplegia, unspecified: G82.50

## 2013-04-19 NOTE — Progress Notes (Signed)
Reason for visit: Multiple sclerosis  Samuel [name] is an 52 y.o. male  History of present illness:  Samuel [name] is a 52 year old right-handed white male with a history of multiple sclerosis with a spastic quadriparesis. The patient has done poorly since last seen. The patient is still living alone at home, but his needs for assistance are growing. The patient is having issues with urinary incontinence at nighttime. The patient is now wearing adult diapers. The patient still taking Tysabri treatments once a month, but it is not clear that this is helpful for him. On 02/05/2013, the patient had an episode where he got overheated outside. The patient has not felt that he has cognitively recovered from this event. The patient is slurring his speech, and he is having problems with cognitive processing. The patient did go to the hospital, and MRI evaluation showed chronic MS lesions, without any acute changes. The patient has had a JC virus antibody test in February 2014 that was negative. The patient is getting physical and occupational therapy in the home environment, and he is trying to get somebody to live in the house with him and help take care of him. The patient got a hospital bed had a crank lift, not an electric bed, and he could not use this. The patient has a lift at home. The patient returns for an evaluation.  Past Medical History  Diagnosis Date  . Multiple sclerosis     1993  . Hypertension   . GERD (gastroesophageal reflux disease)   . Hypercholesteremia   . Incontinence of urine   . Hypothyroidism   . Vitamin D deficiency   . Chronic back pain   . Neuromuscular disorder     Multiple Scerlosis  . Gait disorder   . 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 Appendectomy      age 80    Family History  Problem Relation Age of Onset  . Stroke Father   . Alcoholism Brother     Social history:  reports that he quit smoking  about 27 years ago. His smoking use included Cigarettes. He smoked 2.00 packs per day. He quit smokeless tobacco use about 28 years ago. He reports that he does not drink alcohol or use illicit drugs.  Allergies:  Allergies  Allergen Reactions  . Penicillins     Medications:  Current Outpatient Prescriptions on File Prior to Visit  Medication Sig Dispense Refill  . baclofen (LIORESAL) 20 MG tablet take 2 tablets by mouth three times a day  540 tablet  1  . clonazePAM (KLONOPIN) 1 MG tablet Take 1 tablet (1 mg total) by mouth 2 (two) times daily.  60 tablet  2  . diazepam (VALIUM) 10 MG tablet take 1 tablet by mouth AT NIGHT IF NEEDED  30 tablet  5  . diphenoxylate-atropine (LOMOTIL) 2.5-0.025 MG per tablet take 1 tablet four times a day if needed for diarrhea or LOOSE STOOLS.  120 tablet  5  . doxepin (SINEQUAN) 100 MG capsule Take 100 mg by mouth at bedtime.        Marland Kitchen levothyroxine (SYNTHROID, LEVOTHROID) 100 MCG tablet Take 100 mcg by mouth daily.        . mirabegron ER (MYRBETRIQ) 25 MG TB24 Take 25 mg by mouth daily.      . naproxen sodium (ALEVE) 220 MG tablet Take 220 mg by mouth as needed.      Marland Kitchen omeprazole (  PRILOSEC) 20 MG capsule Take 20 mg by mouth daily.        . sertraline (ZOLOFT) 100 MG tablet Take 200 mg by mouth daily.       . sodium chloride 0.9 % SOLN 100 mL with natalizumab 300 MG/15ML CONC 300 mg Inject 300 mg into the vein every 28 (twenty-eight) days.        . Tamsulosin HCl (FLOMAX) 0.4 MG CAPS Take 0.4 mg by mouth daily after supper.        . terazosin (HYTRIN) 10 MG capsule Take 10 mg by mouth daily.        Marland Kitchen tiZANidine (ZANAFLEX) 4 MG tablet Take 8 mg by mouth 3 (three) times daily.        . Vitamin D, Ergocalciferol, (DRISDOL) 50000 UNITS CAPS Take 50,000 Units by mouth every 7 (seven) days.         No current facility-administered medications on file prior to visit.    ROS:  Out of a complete 14 system review of symptoms, the patient complains only of the  following symptoms, and all other reviewed systems are negative.  Fatigue Swelling in the legs Blurred vision, loss of vision Shortness of breath, snoring Constipation Urinary problems, incontinence Muscle cramps, achy muscles Memory loss, confusion, numbness, weakness, slurred speech, dizziness, tremor Anxiety, depression, too much sleep, change in appetite, reduction of appetite   Blood pressure 111/75, pulse 72, weight 0 lb (0 kg).  Physical Exam  General: The patient is alert and cooperative at the time of the examination.  Skin: 1-2+ edema is noted in the ankles bilaterally.   Neurologic Exam  Cranial nerves: Facial symmetry is present. Speech is slightly dysarthric Extraocular movements are full, with the exception that he has bilateral INO's. Visual fields are full.  Motor: The patient has minimal use of the lower extremities. The patient has 4/5 strength in both upper extremities  Coordination: The patient has slight ataxia with finger-nose-finger bilaterally, the patient is unable to perform heel-to-shin.  Gait and station: The patient is nonambulatory, wheelchair-bound. Increased tone in the legs is noted.  Reflexes: Deep tendon reflexes are symmetric.   Assessment/Plan:  One. Multiple sclerosis  2. Gait disorder  3. Spastic quadriparesis  4. Anxiety and depression  The blood work done in June of 2014 revealed evidence of significant dehydration. We will need to recheck the blood work to ensure that his renal function is normal given the change in cognitive capacity. The patient is on Tysabri, but he likely has a progressive form of multiple sclerosis, and I am not clear that this medication is helping him at this time. We may consider cessation of treatment in the near future. I will try to get a case manager out to contact him concerning treatment plans for him in the future. I believe that the patient will need an extended care facility if he cannot get  somebody to live in the house with him to care for him. The patient will followup in 4-6 months.  Marlan Palau MD 04/19/2013 8:05 PM  [facility] [address] [place], [place] [address]  Phone [phone] Fax [phone]

## 2013-04-23 ENCOUNTER — Encounter: Payer: Self-pay | Admitting: Family Medicine

## 2013-05-12 ENCOUNTER — Other Ambulatory Visit: Payer: Self-pay | Admitting: Neurology

## 2013-05-12 ENCOUNTER — Telehealth: Payer: Self-pay | Admitting: *Deleted

## 2013-05-12 DIAGNOSIS — G35 Multiple sclerosis: Secondary | ICD-10-CM

## 2013-05-12 NOTE — Telephone Encounter (Signed)
Calling needing order for tysabri for pt. Thanks

## 2013-05-12 NOTE — Telephone Encounter (Signed)
I will put in orders for Tysabri.

## 2013-05-14 ENCOUNTER — Other Ambulatory Visit (HOSPITAL_COMMUNITY): Payer: Self-pay | Admitting: Neurology

## 2013-05-14 ENCOUNTER — Ambulatory Visit (HOSPITAL_COMMUNITY)
Admission: RE | Admit: 2013-05-14 | Discharge: 2013-05-14 | Disposition: A | Discharge status: Home / Self Care | Payer: Medicare Other | Source: Ambulatory Visit | Attending: Neurology | Admitting: Neurology

## 2013-05-14 ENCOUNTER — Encounter (HOSPITAL_COMMUNITY): Payer: Self-pay

## 2013-05-14 ENCOUNTER — Telehealth: Payer: Self-pay | Admitting: Neurology

## 2013-05-14 VITALS — BP 97/66 | HR 67 | Temp 97.7°F | Resp 19

## 2013-05-14 DIAGNOSIS — G35 Multiple sclerosis: Secondary | ICD-10-CM | POA: Insufficient documentation

## 2013-05-14 MED ORDER — DANTROLENE SODIUM 25 MG PO CAPS: ORAL_CAPSULE | ORAL | Status: DC

## 2013-05-14 MED ORDER — SODIUM CHLORIDE 0.9 % IV SOLN
Freq: Once | INTRAVENOUS | Status: AC
  Administered 2013-05-14: 15:00:00 via INTRAVENOUS

## 2013-05-14 MED ORDER — SODIUM CHLORIDE 0.9 % IV SOLN
300.0000 mg | INTRAVENOUS | Status: DC
  Administered 2013-05-14: 300 mg via INTRAVENOUS
  Filled 2013-05-14: qty 15

## 2013-05-14 MED ORDER — LORATADINE 10 MG PO TABS
10.0000 mg | ORAL_TABLET | ORAL | Status: DC
  Administered 2013-05-14: 10 mg via ORAL
  Filled 2013-05-14: qty 1

## 2013-05-14 MED ORDER — ACETAMINOPHEN 325 MG PO TABS
650.0000 mg | ORAL_TABLET | ORAL | Status: DC
  Administered 2013-05-14: 650 mg via ORAL
  Filled 2013-05-14: qty 2

## 2013-05-14 NOTE — Telephone Encounter (Signed)
I called patient. The patient is having increased cramps at night and during the day. The patient is on maximum doses of baclofen, and he is also taking diazepam, 10 mg at night, and clonazepam. The patient will be placed on dantrolene. We will need to check liver enzymes in 2 months. The patient does not wish to consider a baclofen pump. The patient will go up to 20 mg at night of the diazepam for now until he gets started on the dantrolene.

## 2013-05-17 ENCOUNTER — Other Ambulatory Visit: Payer: Self-pay | Admitting: Neurology

## 2013-05-21 ENCOUNTER — Telehealth: Payer: Self-pay | Admitting: Neurology

## 2013-05-21 NOTE — Telephone Encounter (Signed)
Patient has severe leg cramps. Dr. Anne Hahn has him on the max dose of oral Baclofen, Diazepam 20 and he just started Dantrolene. He says the cramps are still severe. Please advise.

## 2013-05-24 NOTE — Telephone Encounter (Signed)
[name] to address upon his return

## 2013-05-25 NOTE — Telephone Encounter (Signed)
Dr. Anne Hahn please address as this message was sent to Dr. Pearlean Brownie. He felt it could wait for you to address. Patient having severe leg cramps.

## 2013-05-27 ENCOUNTER — Telehealth: Payer: Self-pay | Admitting: Neurology

## 2013-05-27 NOTE — Telephone Encounter (Signed)
Please respond. Thank you.

## 2013-05-27 NOTE — Telephone Encounter (Signed)
I called the patient. The patient apparently still having some issues with the spasms in the legs. He should be going up to the 50 mg 3 times daily dantrolene dosing at this point. If he has not already done, he needs to do now. We will continue to push the dose up if needed. The patient will call me if this is the case.

## 2013-06-06 ENCOUNTER — Other Ambulatory Visit: Payer: Self-pay | Admitting: Neurology

## 2013-06-07 DIAGNOSIS — Z0289 Encounter for other administrative examinations: Secondary | ICD-10-CM

## 2013-06-10 ENCOUNTER — Telehealth: Payer: Self-pay | Admitting: Neurology

## 2013-06-11 ENCOUNTER — Telehealth: Payer: Self-pay | Admitting: Neurology

## 2013-06-11 ENCOUNTER — Ambulatory Visit (HOSPITAL_COMMUNITY)
Admission: RE | Admit: 2013-06-11 | Discharge: 2013-06-11 | Disposition: A | Discharge status: Home / Self Care | Payer: Medicare Other | Source: Ambulatory Visit | Attending: Neurology | Admitting: Neurology

## 2013-06-11 VITALS — BP 148/88 | HR 63 | Temp 97.6°F | Resp 18

## 2013-06-11 DIAGNOSIS — G35 Multiple sclerosis: Secondary | ICD-10-CM | POA: Insufficient documentation

## 2013-06-11 MED ORDER — SODIUM CHLORIDE 0.9 % IV SOLN
300.0000 mg | INTRAVENOUS | Status: DC
  Administered 2013-06-11: 300 mg via INTRAVENOUS
  Filled 2013-06-11: qty 15

## 2013-06-11 MED ORDER — ACETAMINOPHEN 325 MG PO TABS
650.0000 mg | ORAL_TABLET | ORAL | Status: DC
  Administered 2013-06-11: 650 mg via ORAL
  Filled 2013-06-11: qty 2

## 2013-06-11 MED ORDER — SODIUM CHLORIDE 0.9 % IV SOLN
Freq: Once | INTRAVENOUS | Status: AC
  Administered 2013-06-11: 13:00:00 via INTRAVENOUS

## 2013-06-11 MED ORDER — LORATADINE 10 MG PO TABS
10.0000 mg | ORAL_TABLET | ORAL | Status: DC
  Administered 2013-06-11: 10 mg via ORAL
  Filled 2013-06-11: qty 1

## 2013-06-11 NOTE — Progress Notes (Addendum)
Pt here today for his TYSABRI infusion. Pt states he is having a lot of muscle spasms in legs and has left messages at [name] office "for the past two days and I have called today and have not heard back". He is very frustrated about these muscle spasms and wants to discuss it with [name]. I placed a call to [facility] and left a voice mail on [name]' nurse Alverda Skeans ) phone about this and awaiting call back to speak with patient. ( called office at 1255 today)

## 2013-06-11 NOTE — Telephone Encounter (Signed)
Returned call. No answer.  

## 2013-06-11 NOTE — Progress Notes (Signed)
Explained to pt that is looks like someone called him from [facility] at 206pm but maybe it was his home phone # since there was no answer. Pt has finished  his infusion and is ready for discharge and states he will see if a message was left at home and try and call back to [facility] when he gets home. Discharged accompanied by Mom and borther inlaw

## 2013-06-11 NOTE — Telephone Encounter (Signed)
FYI

## 2013-06-12 MED ORDER — DANTROLENE SODIUM 100 MG PO CAPS: 100.0000 mg | ORAL_CAPSULE | Freq: Three times a day (TID) | ORAL | Status: DC

## 2013-06-12 NOTE — Telephone Encounter (Signed)
I called the patient. He is not getting an adequate response to the dantrium at the 50 mg tid dose. I will go to 100 mg tid.

## 2013-06-23 ENCOUNTER — Telehealth: Payer: Self-pay | Admitting: Neurology

## 2013-06-23 NOTE — Telephone Encounter (Signed)
I called the patient. The patient is on dantrolene at 300 mg daily, and he is now on the donut hole. The patient may be a will to get the medication she read another pharmacy. The patient does not believe that the dantrolene has made any difference with the spasticity. I have once again discussed the possibility of considering a baclofen pump, but this will require a hospitalization. The patient will think about this.

## 2013-06-23 NOTE — Telephone Encounter (Signed)
I called the patient to verify he was inquiring about Dantrolene.  He was, and said he is unable to afford the co-pay.  Unfortunately, this medication is not sampled.  By researching med costs online, the least expensive pharmacy for this med would be [facility].  They charge ~$75.  Patient says he is not able to afford that amount either and would like to know if a different med could be prescribed.  Please advise.  Thank you.

## 2013-07-05 ENCOUNTER — Other Ambulatory Visit: Payer: Self-pay | Admitting: Neurology

## 2013-07-09 ENCOUNTER — Encounter (HOSPITAL_COMMUNITY): Payer: Self-pay

## 2013-07-09 ENCOUNTER — Encounter (HOSPITAL_COMMUNITY)
Admission: RE | Admit: 2013-07-09 | Discharge: 2013-07-09 | Disposition: A | Discharge status: Home / Self Care | Payer: Medicare Other | Source: Ambulatory Visit | Attending: Neurology | Admitting: Neurology

## 2013-07-09 VITALS — BP 123/83 | HR 82 | Temp 97.9°F | Resp 18

## 2013-07-09 DIAGNOSIS — G35 Multiple sclerosis: Secondary | ICD-10-CM | POA: Insufficient documentation

## 2013-07-09 LAB — CBC WITH DIFFERENTIAL/PLATELET
Basophils Absolute: 0 10*3/uL (ref 0.0–0.1)
Basophils Relative: 0 % (ref 0–1)
Eosinophils Absolute: 0.2 10*3/uL (ref 0.0–0.7)
Eosinophils Relative: 2 % (ref 0–5)
HCT: 41.1 % (ref 39.0–52.0)
Hemoglobin: 13.9 g/dL (ref 13.0–17.0)
Lymphocytes Relative: 24 % (ref 12–46)
Lymphs Abs: 2.3 10*3/uL (ref 0.7–4.0)
MCH: 28.9 pg (ref 26.0–34.0)
MCHC: 33.8 g/dL (ref 30.0–36.0)
MCV: 85.4 fL (ref 78.0–100.0)
Monocytes Absolute: 0.8 10*3/uL (ref 0.1–1.0)
Monocytes Relative: 8 % (ref 3–12)
Neutro Abs: 6.4 10*3/uL (ref 1.7–7.7)
Neutrophils Relative %: 66 % (ref 43–77)
Platelets: 162 10*3/uL (ref 150–400)
RBC: 4.81 MIL/uL (ref 4.22–5.81)
RDW: 14.9 % (ref 11.5–15.5)
WBC: 9.7 10*3/uL (ref 4.0–10.5)

## 2013-07-09 LAB — COMPREHENSIVE METABOLIC PANEL
ALT: 11 U/L (ref 0–53)
AST: 14 U/L (ref 0–37)
Albumin: 3.8 g/dL (ref 3.5–5.2)
Alkaline Phosphatase: 92 U/L (ref 39–117)
BUN: 9 mg/dL (ref 6–23)
CO2: 28 mEq/L (ref 19–32)
Calcium: 9.9 mg/dL (ref 8.4–10.5)
Chloride: 101 mEq/L (ref 96–112)
Creatinine, Ser: 0.81 mg/dL (ref 0.50–1.35)
GFR calc Af Amer: >90 mL/min (ref >90)
GFR calc non Af Amer: >90 mL/min (ref >90)
Glucose, Bld: 102 mg/dL — ABNORMAL HIGH (ref 70–99)
Potassium: 4.1 mEq/L (ref 3.5–5.1)
Sodium: 138 mEq/L (ref 135–145)
Total Bilirubin: 0.2 mg/dL — ABNORMAL LOW (ref 0.3–1.2)
Total Protein: 6.8 g/dL (ref 6.0–8.3)

## 2013-07-09 MED ORDER — SODIUM CHLORIDE 0.9 % IV SOLN
INTRAVENOUS | Status: DC
  Administered 2013-07-09: 13:00:00 via INTRAVENOUS

## 2013-07-09 MED ORDER — ACETAMINOPHEN 325 MG PO TABS
650.0000 mg | ORAL_TABLET | ORAL | Status: AC
  Administered 2013-07-09: 650 mg via ORAL
  Filled 2013-07-09: qty 2

## 2013-07-09 MED ORDER — LORATADINE 10 MG PO TABS
10.0000 mg | ORAL_TABLET | ORAL | Status: AC
  Administered 2013-07-09: 10 mg via ORAL
  Filled 2013-07-09: qty 1

## 2013-07-09 MED ORDER — SODIUM CHLORIDE 0.9 % IV SOLN
300.0000 mg | INTRAVENOUS | Status: DC
  Administered 2013-07-09: 300 mg via INTRAVENOUS
  Filled 2013-07-09: qty 15

## 2013-07-09 NOTE — Progress Notes (Signed)
Quick Note:  Shared result findings with patient per [name], confirmed ______

## 2013-08-04 ENCOUNTER — Telehealth: Payer: Self-pay | Admitting: Neurology

## 2013-08-04 MED ORDER — AMITRIPTYLINE HCL 100 MG PO TABS: 100.0000 mg | ORAL_TABLET | Freq: Every day | ORAL | Status: DC

## 2013-08-04 NOTE — Telephone Encounter (Signed)
I called patient. The doxepin has become expensive, I will switch the patient over to amitriptyline.

## 2013-08-04 NOTE — Telephone Encounter (Signed)
Patient called stating that his insurance increased the copay for Doxzim from $8 to $95 and was offered 3 different alternatives. Patient would like to speak to someone about these different alternatives. Please advise.

## 2013-08-06 ENCOUNTER — Encounter (HOSPITAL_COMMUNITY): Admission: RE | Admit: 2013-08-06 | Payer: Medicare Other | Source: Ambulatory Visit

## 2013-08-12 ENCOUNTER — Encounter (HOSPITAL_COMMUNITY)
Admission: RE | Admit: 2013-08-12 | Discharge: 2013-08-12 | Disposition: A | Discharge status: Home / Self Care | Payer: Medicare Other | Source: Ambulatory Visit | Attending: Neurology | Admitting: Neurology

## 2013-08-12 ENCOUNTER — Encounter (HOSPITAL_COMMUNITY): Payer: Self-pay

## 2013-08-12 ENCOUNTER — Other Ambulatory Visit: Payer: Self-pay | Admitting: Neurology

## 2013-08-12 VITALS — BP 130/80 | HR 72 | Temp 98.4°F | Resp 16

## 2013-08-12 DIAGNOSIS — G35 Multiple sclerosis: Secondary | ICD-10-CM

## 2013-08-12 MED ORDER — ACETAMINOPHEN 325 MG PO TABS
650.0000 mg | ORAL_TABLET | ORAL | Status: AC
  Administered 2013-08-12: 650 mg via ORAL
  Filled 2013-08-12: qty 2

## 2013-08-12 MED ORDER — SODIUM CHLORIDE 0.9 % IV SOLN
300.0000 mg | INTRAVENOUS | Status: DC
  Administered 2013-08-12: 300 mg via INTRAVENOUS
  Filled 2013-08-12: qty 15

## 2013-08-12 MED ORDER — LORATADINE 10 MG PO TABS
10.0000 mg | ORAL_TABLET | ORAL | Status: AC
  Administered 2013-08-12: 10 mg via ORAL
  Filled 2013-08-12: qty 1

## 2013-08-12 MED ORDER — SODIUM CHLORIDE 0.9 % IV SOLN: INTRAVENOUS | Status: DC

## 2013-09-04 ENCOUNTER — Other Ambulatory Visit: Payer: Self-pay | Admitting: Neurology

## 2013-09-09 ENCOUNTER — Encounter (HOSPITAL_COMMUNITY)
Admission: RE | Admit: 2013-09-09 | Discharge: 2013-09-09 | Disposition: A | Discharge status: Home / Self Care | Payer: Medicare Other | Source: Ambulatory Visit | Attending: Neurology | Admitting: Neurology

## 2013-09-09 ENCOUNTER — Encounter (HOSPITAL_COMMUNITY): Payer: Self-pay

## 2013-09-09 VITALS — BP 124/79 | HR 79 | Temp 97.7°F | Resp 18 | Ht 73.0 in | Wt 190.0 lb

## 2013-09-09 DIAGNOSIS — G35 Multiple sclerosis: Secondary | ICD-10-CM | POA: Insufficient documentation

## 2013-09-09 MED ORDER — SODIUM CHLORIDE 0.9 % IV SOLN
300.0000 mg | INTRAVENOUS | Status: DC
  Administered 2013-09-09: 300 mg via INTRAVENOUS
  Filled 2013-09-09: qty 15

## 2013-09-09 MED ORDER — LORATADINE 10 MG PO TABS
10.0000 mg | ORAL_TABLET | ORAL | Status: DC
  Administered 2013-09-09: 10 mg via ORAL
  Filled 2013-09-09: qty 1

## 2013-09-09 MED ORDER — ACETAMINOPHEN 325 MG PO TABS
650.0000 mg | ORAL_TABLET | ORAL | Status: DC
  Administered 2013-09-09: 650 mg via ORAL
  Filled 2013-09-09: qty 2

## 2013-09-09 MED ORDER — SODIUM CHLORIDE 0.9 % IV SOLN
Freq: Once | INTRAVENOUS | Status: AC
  Administered 2013-09-09: 13:00:00 via INTRAVENOUS

## 2013-09-09 NOTE — Discharge Instructions (Signed)
Natalizumab injection °What is this medicine? °NATALIZUMAB (na ta LIZ you mab) is used to treat relapsing multiple sclerosis. This drug is not a cure. It is also used to treat Crohn's disease. °This medicine may be used for other purposes; ask your health care provider or pharmacist if you have questions. °COMMON BRAND NAME(S): Tysabri °What should I tell my health care provider before I take this medicine? °They need to know if you have any of these conditions: °-immune system problems °-progressive multifocal leukoencephalopathy (PML) °-an unusual or allergic reaction to natalizumab, other medicines, foods, dyes, or preservatives °-pregnant or trying to get pregnant °-breast-feeding °How should I use this medicine? °This medicine is for infusion into a vein. It is given by a health care professional in a hospital or clinic setting. °A special MedGuide will be given to you by the pharmacist with each prescription and refill. Be sure to read this information carefully each time. °Talk to your pediatrician regarding the use of this medicine in children. This medicine is not approved for use in children. °Overdosage: If you think you have taken too much of this medicine contact a poison control center or emergency room at once. °NOTE: This medicine is only for you. Do not share this medicine with others. °What if I miss a dose? °It is important not to miss your dose. Call your doctor or health care professional if you are unable to keep an appointment. °What may interact with this medicine? °-azathioprine °-cyclosporine °-interferon °-6-mercaptopurine °-methotrexate °-steroid medicines like prednisone or cortisone °-TNF-alpha inhibitors like adalimumab, etanercept, and infliximab °-vaccines °This list may not describe all possible interactions. Give your health care provider a list of all the medicines, herbs, non-prescription drugs, or dietary supplements you use. Also tell them if you smoke, drink alcohol, or use  illegal drugs. Some items may interact with your medicine. °What should I watch for while using this medicine? °Your condition will be monitored carefully while you are receiving this medicine. Visit your doctor for regular check ups. Tell your doctor or healthcare professional if your symptoms do not start to get better or if they get worse. °Stay away from people who are sick. Call your doctor or health care professional for advice if you get a fever, chills or sore throat, or other symptoms of a cold or flu. Do not treat yourself. °In some patients, this medicine may cause a serious brain infection that may cause death. If you have any problems seeing, thinking, speaking, walking, or standing, tell your doctor right away. If you cannot reach your doctor, get urgent medical care. °What side effects may I notice from receiving this medicine? °Side effects that you should report to your doctor or health care professional as soon as possible: °-allergic reactions like skin rash, itching or hives, swelling of the face, lips, or tongue °-breathing problems °-changes in vision °-chest pain °-dark urine °-depression, feelings of sadness °-dizziness °-general ill feeling or flu-like symptoms °-irregular, missed, or painful menstrual periods °-light-colored stools °-loss of appetite, nausea °-muscle weakness °-problems with balance, talking, or walking °-right upper belly pain °-unusually weak or tired °-yellowing of the eyes or skin °Side effects that usually do not require medical attention (report to your doctor or health care professional if they continue or are bothersome): °-aches, pains °-headache °-stomach upset °-tiredness °This list may not describe all possible side effects. Call your doctor for medical advice about side effects. You may report side effects to FDA at 1-800-FDA-1088. °Where should I keep   my medicine? °This drug is given in a hospital or clinic and will not be stored at home. °NOTE: This sheet is  a summary. It may not cover all possible information. If you have questions about this medicine, talk to your doctor, pharmacist, or health care provider. °© 2014, Elsevier/Gold Standard. (2008-10-08 13:33:21) ° °Multiple Sclerosis °Multiple sclerosis (MS) is a disease of the central nervous system. Its cause is unknown. It is more common in the northern states than in the southern states. There is a higher incidence of MS in women. There is a wide variation in the symptoms (problems) of MS. This is because of the many different ways it affects the central nervous system. It often comes on in episodes or attacks. These attacks may last weeks to months. There may be long periods of nearly no problems between attacks. The main symptoms include visual problems (associated with eye pain), numbness, weakness, and paralysis in extremities (arms/hands and legs/feet). There may also be tremors and problems with balance and walking. The age when MS starts is variable. Advances in medicine continue to improve the treatment of this illness. There is no known cure for MS but there are medications that help. MS is not an inherited illness, although your risk of getting this disease is higher if you have a relative with MS. The best radiologic (x-ray) study for MS is an MRI (magnetic resonance imaging). There are medications available to decrease the number and frequency of attacks. °SYMPTOMS  °The symptoms of MS are caused by loss of insulation (myelin) of the nerves of the brain. When this happens, brain signals do not get transmitted properly or may not get transmitted at all. Some of the problems caused by this include:  °· Numbness. °· Weakness. °· Paralysis in extremities. °· Visual problems, eye pain. °· Balance problems. °· Tremors. °DIAGNOSIS  °Your caregiver can do studies on you to make this diagnosis. This may include specialized X-rays and spinal fluid studies. °HOME CARE INSTRUCTIONS  °· Take medications as directed  by your caregiver. Baclofen® is a drug commonly used to reduce muscle spasticity. Steroids are often used for short term relief. °· Exercise as directed. °· Use physical and occupational therapy as directed by your caregiver. Careful attention to this medical care can help avoid depression. °· See your caregiver if you begin to have problems with depression. This is a common problem in MS. Patients often continue to work many years after the diagnosis of MS. °Document Released: 08/16/2000 Document Revised: 11/11/2011 Document Reviewed: 03/25/2007 °ExitCare® Patient Information ©2014 [employer]. ° ° ° °

## 2013-09-09 NOTE — Progress Notes (Signed)
Pt has decided to stay only 15 minutes of the suggested 1 hour observation post infusion time. Pt states he will contact his neurologist if he has any questions or concens

## 2013-10-03 ENCOUNTER — Other Ambulatory Visit: Payer: Self-pay | Admitting: Neurology

## 2013-10-04 NOTE — Telephone Encounter (Signed)
Rx signed and faxed.

## 2013-10-07 ENCOUNTER — Encounter (HOSPITAL_COMMUNITY): Payer: Medicare Other

## 2013-10-08 ENCOUNTER — Encounter (HOSPITAL_COMMUNITY)
Admission: RE | Admit: 2013-10-08 | Discharge: 2013-10-08 | Disposition: A | Discharge status: Home / Self Care | Payer: Medicare Other | Source: Ambulatory Visit | Attending: Neurology | Admitting: Neurology

## 2013-10-08 ENCOUNTER — Encounter (HOSPITAL_COMMUNITY): Payer: Self-pay

## 2013-10-08 VITALS — BP 156/94 | HR 87 | Temp 97.0°F | Resp 18

## 2013-10-08 DIAGNOSIS — G35 Multiple sclerosis: Secondary | ICD-10-CM

## 2013-10-08 MED ORDER — SODIUM CHLORIDE 0.9 % IV SOLN
INTRAVENOUS | Status: DC
  Administered 2013-10-08: 14:00:00 via INTRAVENOUS

## 2013-10-08 MED ORDER — LORATADINE 10 MG PO TABS
10.0000 mg | ORAL_TABLET | ORAL | Status: DC
  Administered 2013-10-08: 10 mg via ORAL
  Filled 2013-10-08: qty 1

## 2013-10-08 MED ORDER — ACETAMINOPHEN 325 MG PO TABS
650.0000 mg | ORAL_TABLET | ORAL | Status: DC
  Administered 2013-10-08: 650 mg via ORAL
  Filled 2013-10-08: qty 2

## 2013-10-08 MED ORDER — SODIUM CHLORIDE 0.9 % IV SOLN
300.0000 mg | INTRAVENOUS | Status: DC
  Administered 2013-10-08: 300 mg via INTRAVENOUS
  Filled 2013-10-08: qty 15

## 2013-10-08 NOTE — Progress Notes (Signed)
Patient stayed 15 minutes  (out of recommended 1 hour) post Tysabri infusion. Tolerated well and home with mom and sister.

## 2013-11-03 DIAGNOSIS — Z0289 Encounter for other administrative examinations: Secondary | ICD-10-CM

## 2013-11-05 ENCOUNTER — Other Ambulatory Visit: Payer: Self-pay | Admitting: Neurology

## 2013-11-08 ENCOUNTER — Encounter (INDEPENDENT_AMBULATORY_CARE_PROVIDER_SITE_OTHER): Payer: Self-pay

## 2013-11-08 ENCOUNTER — Ambulatory Visit (INDEPENDENT_AMBULATORY_CARE_PROVIDER_SITE_OTHER): Payer: Medicare Other | Admitting: Neurology

## 2013-11-08 ENCOUNTER — Encounter: Payer: Self-pay | Admitting: Neurology

## 2013-11-08 VITALS — BP 116/78 | HR 77

## 2013-11-08 DIAGNOSIS — G35 Multiple sclerosis: Secondary | ICD-10-CM

## 2013-11-08 DIAGNOSIS — G8250 Quadriplegia, unspecified: Secondary | ICD-10-CM

## 2013-11-08 LAB — CBC WITH DIFFERENTIAL
Basophils Absolute: 0 10*3/uL (ref 0.0–0.2)
Basos: 0 %
Eos: 1 %
Eosinophils Absolute: 0.1 10*3/uL (ref 0.0–0.4)
HCT: 39.9 % (ref 37.5–51.0)
Hemoglobin: 13.9 g/dL (ref 12.6–17.7)
Lymphocytes Absolute: 2.8 10*3/uL (ref 0.7–3.1)
Lymphs: 33 %
MCH: 30.8 pg (ref 26.6–33.0)
MCHC: 34.8 g/dL (ref 31.5–35.7)
MCV: 89 fL (ref 79–97)
Monocytes Absolute: 0.6 10*3/uL (ref 0.1–0.9)
Monocytes: 7 %
Neutrophils Absolute: 5.1 10*3/uL (ref 1.4–7.0)
Neutrophils Relative %: 59 %
Platelets: 179 10*3/uL (ref 150–379)
RBC: 4.51 x10E6/uL (ref 4.14–5.80)
RDW: 15.6 % — ABNORMAL HIGH (ref 12.3–15.4)
WBC: 8.6 10*3/uL (ref 3.4–10.8)

## 2013-11-08 LAB — COMPREHENSIVE METABOLIC PANEL
ALT: 21 IU/L (ref 0–44)
AST: 16 IU/L (ref 0–40)
Albumin/Globulin Ratio: 2 (ref 1.1–2.5)
Albumin: 4.6 g/dL (ref 3.5–5.5)
Alkaline Phosphatase: 105 IU/L (ref 39–117)
BUN/Creatinine Ratio: 14 (ref 9–20)
BUN: 10 mg/dL (ref 6–24)
CO2: 31 mmol/L — ABNORMAL HIGH (ref 18–29)
Calcium: 10 mg/dL (ref 8.7–10.2)
Chloride: 103 mmol/L (ref 96–108)
Creatinine, Ser: 0.72 mg/dL — ABNORMAL LOW (ref 0.76–1.27)
GFR calc Af Amer: 123 mL/min/{1.73_m2} (ref >59)
GFR calc non Af Amer: 107 mL/min/{1.73_m2} (ref >59)
Globulin, Total: 2.3 g/dL (ref 1.5–4.5)
Glucose: 88 mg/dL (ref 65–99)
Potassium: 3.8 mmol/L (ref 3.5–5.2)
Sodium: 142 mmol/L (ref 134–144)
Total Bilirubin: 0.2 mg/dL (ref 0.0–1.2)
Total Protein: 6.9 g/dL (ref 6.0–8.5)

## 2013-11-08 NOTE — Patient Instructions (Signed)
Multiple Sclerosis  Multiple sclerosis (MS) is a disease of the central nervous system. It leads to loss of the insulating covering of the nerves (myelin sheath) of your brain. When this happens, brain signals do not get transmitted properly or may not get transmitted at all. The symptoms of MS occur in episodes or attacks. These attacks may last weeks to months. There may be long periods of nearly no problems between attacks. The age of onset of MS varies.   CAUSES  The cause of MS is unknown. However, it is more common in the northern [place] than in the southern [place].  RISK FACTORS  There is a higher incidence of MS in women than in men. MS is not an inherited illness, although your risk of MS is higher if you have a relative with MS.  SIGNS AND SYMPTOMS   The symptoms of MS occur in episodes or attacks. These attacks may last weeks to months. There may be long periods of almost no symptoms between attacks.  The symptoms of MS vary. This is because of the many different ways it affects the central nervous system. The main symptoms of MS include:   Vision problems and eye pain.   Numbness.   Weakness.   Paralysis in your arms, hands, feet, and legs (extremities).   Balance problems.   Tremors.  DIAGNOSIS   Your health care provider can diagnose MS with the help of imaging exams and lab tests. These may include specialized X-ray exams and spinal fluid tests. The best imaging exam to confirm a diagnosis of MS is MRI.  TREATMENT   There is no known cure for MS, but there are medicines that can decrease the number and frequency of attacks. Steroids are often used for short-term relief. Physical and occupational therapy may also help.  HOME CARE INSTRUCTIONS    Take medicines as directed by your health care provider.   Exercise as directed by your health care provider.  SEEK MEDICAL CARE IF:  You begin to feel depressed.  SEEK IMMEDIATE MEDICAL CARE IF:   You develop paralysis.   You develop  problems with bladder, bowel, or sexual function.   You develop mental changes, such as forgetfulness or mood swings.   You have a seizure.  Document Released: 08/16/2000 Document Revised: 06/09/2013 Document Reviewed: 04/26/2013  ExitCare Patient Information 2014 [employer].

## 2013-11-08 NOTE — Progress Notes (Signed)
Reason for visit: Multiple sclerosis  Samuel [name] is an 53 y.o. male  History of present illness:  Mr. 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 The patient has developed a progressive spastic quadriparesis. The patient is no longer ambulatory. The patient has severe spasticity involving the legs that is quite painful, keeping him awake at night. The patient is on multiple medications including clonazepam, diazepam, tizanidine, baclofen, and Dantrium without full control of the spasticity. The patient denies any recent falls. The patient has not had any problems with choking with swallowing. The patient does have a caretaker that comes in and takes care of him during the day. The patient has had problems with frequent bladder infections, but he has done well on cranberry tablets. The patient returns for an evaluation. He remains on Tysabri. The patient indicates that he has a rash on the left neck and shoulder that gets better with the Tysabri, and then worsens right before his next dose. The rash is not anywhere else on the body.  Past Medical History  Diagnosis Date  . Hypertension   . GERD (gastroesophageal reflux disease)   . Hypercholesteremia   . Incontinence of urine   . Hypothyroidism   . Vitamin D deficiency   . Chronic back pain   . Neuromuscular disorder     Multiple Scerlosis  . Gait disorder   . 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 Multiple sclerosis     1993, JC virus negative 2/14    Past Surgical History  Procedure Laterality Date  . Appendectomy      age 53    Family History  Problem Relation Age of Onset  . Stroke Father   . Alcoholism Brother     Social history:  reports that he quit smoking about 28 years ago. His smoking use included Cigarettes. He smoked 2.00 packs per day. He quit smokeless tobacco use about 29 years ago. He reports that he does not drink alcohol or use  illicit drugs.    Allergies  Allergen Reactions  . Penicillins     Medications:  Current Outpatient Prescriptions on File Prior to Visit  Medication Sig Dispense Refill  . amitriptyline (ELAVIL) 100 MG tablet Take 1 tablet (100 mg total) by mouth at bedtime.  30 tablet  5  . baclofen (LIORESAL) 20 MG tablet take 2 tablets by mouth three times a day  540 tablet  1  . clonazePAM (KLONOPIN) 1 MG tablet take 1 tablet by mouth twice a day  60 tablet  2  . clotrimazole-betamethasone (LOTRISONE) cream Apply 0.5 application topically.      . dantrolene (DANTRIUM) 100 MG capsule take 1 capsule by mouth three times a day  90 capsule  5  . diazepam (VALIUM) 10 MG tablet take 1 tablet by mouth AT NIGHT IF NEEDED  30 tablet  5  . levothyroxine (SYNTHROID, LEVOTHROID) 100 MCG tablet Take 100 mcg by mouth daily.        . mirabegron ER (MYRBETRIQ) 25 MG TB24 Take 50 mg by mouth daily.       . naproxen sodium (ALEVE) 220 MG tablet Take 220 mg by mouth as needed.      . natalizumab (TYSABRI) 300 MG/15ML injection Inject 300 mg into the vein every 28 (twenty-eight) days.      Marland Kitchen. omeprazole (PRILOSEC) 20 MG capsule Take 20 mg by mouth daily.        .Marland Kitchen  sertraline (ZOLOFT) 100 MG tablet Take 200 mg by mouth at bedtime.       . sodium chloride 0.9 % SOLN 100 mL with natalizumab 300 MG/15ML CONC 300 mg Inject 300 mg into the vein every 28 (twenty-eight) days.        . Tamsulosin HCl (FLOMAX) 0.4 MG CAPS Take 0.4 mg by mouth daily after supper.        . terazosin (HYTRIN) 10 MG capsule Take 10 mg by mouth daily.        Marland Kitchen tiZANidine (ZANAFLEX) 4 MG tablet take 1 tablet by mouth every 4 hours if needed  180 tablet  6  . Vitamin D, Ergocalciferol, (DRISDOL) 50000 UNITS CAPS Take 50,000 Units by mouth every 7 (seven) days. Friday only      . ALPRAZolam (XANAX) 0.5 MG tablet Take 0.5 mg by mouth.      . diphenoxylate-atropine (LOMOTIL) 2.5-0.025 MG per tablet take 1 tablet by mouth four times a day if needed for  diarrhea or LOOSE STOOLS  120 tablet  2   No current facility-administered medications on file prior to visit.    ROS:  Out of a complete 14 system review of symptoms, the patient complains only of the following symptoms, and all other reviewed systems are negative.  Appetite change, chills, fever, fatigue Skin rash Eye itching Gait disorder Spasticity, muscle spasms  Blood pressure 116/78, pulse 77, weight 0 lb (0 kg).  Physical Exam  General: The patient is alert and cooperative at the time of the examination.  Skin: No significant peripheral edema is noted.   Neurologic Exam  Mental status: The patient is oriented x 3.  Cranial nerves: Facial symmetry is present. Speech is slightly dysarthric. Extraocular movements are full, with exception that the patient has bilateral INO, and some restriction of superior gaze. Visual fields are full. Pupils are equal, round, and reactive to light. Discs are flat bilaterally.  Motor: The patient has increased tone, minimal use of the lower extremities. With the upper extremities, the patient has 4/5 grip strength in the hands, and with proximal strength of the arms.  Sensory examination: Soft touch sensation is symmetric on the face, arms, and legs.  Coordination: The patient has ataxia with finger-nose-finger bilaterally, unable to perform heel-to-shin on either side.  Gait and station: The patient is nonambulatory, wheelchair-bound.  Reflexes: Deep tendon reflexes are symmetric, somewhat depressed.   Assessment/Plan:  1. Multiple sclerosis  2. Spastic quadriparesis  3. Gait disorder   The patient has severe spasticity. Once again, I have discussed the possibility of a baclofen pump placement, but the patient does not seem to be enthusiastic about this option. Multiple oral medications have not resulted in a significant benefit with the spasticity. The patient will be sent for blood work today, and this will include a JC virus  viral antibody titer. The patient will undergo MRI of the brain. The patient will followup in 6 months.  Marlan Palau MD 11/08/2013 6:54 PM  [facility] [address] [place], [place] [address]  Phone [phone] Fax [phone]

## 2013-11-09 ENCOUNTER — Encounter (HOSPITAL_COMMUNITY): Payer: Medicare Other

## 2013-11-09 ENCOUNTER — Telehealth: Payer: Self-pay | Admitting: Neurology

## 2013-11-09 ENCOUNTER — Other Ambulatory Visit: Payer: Self-pay | Admitting: Neurology

## 2013-11-09 DIAGNOSIS — G35 Multiple sclerosis: Secondary | ICD-10-CM

## 2013-11-09 NOTE — Telephone Encounter (Signed)
I'll put in orders for the Tysabri infusion.

## 2013-11-09 NOTE — Progress Notes (Signed)
Quick Note:  Shared unremarkable blood work with patient, he verbalized understanding ______ 

## 2013-11-09 NOTE — Telephone Encounter (Signed)
[facility] called and needs orders for patient's Tysabri.  His appointment is Thursday.

## 2013-11-11 ENCOUNTER — Encounter (HOSPITAL_COMMUNITY): Payer: Self-pay

## 2013-11-11 ENCOUNTER — Encounter (HOSPITAL_COMMUNITY)
Admission: RE | Admit: 2013-11-11 | Discharge: 2013-11-11 | Disposition: A | Discharge status: Home / Self Care | Payer: Medicare Other | Source: Ambulatory Visit | Attending: Neurology | Admitting: Neurology

## 2013-11-11 VITALS — BP 121/79 | HR 95 | Temp 97.9°F | Resp 18

## 2013-11-11 DIAGNOSIS — G35 Multiple sclerosis: Secondary | ICD-10-CM | POA: Insufficient documentation

## 2013-11-11 MED ORDER — SODIUM CHLORIDE 0.9 % IV SOLN
Freq: Once | INTRAVENOUS | Status: AC
  Administered 2013-11-11: 10:00:00 via INTRAVENOUS

## 2013-11-11 MED ORDER — ACETAMINOPHEN 325 MG PO TABS
650.0000 mg | ORAL_TABLET | ORAL | Status: DC
  Administered 2013-11-11: 650 mg via ORAL
  Filled 2013-11-11: qty 2

## 2013-11-11 MED ORDER — SODIUM CHLORIDE 0.9 % IV SOLN
300.0000 mg | INTRAVENOUS | Status: DC
  Administered 2013-11-11: 300 mg via INTRAVENOUS
  Filled 2013-11-11: qty 15

## 2013-11-11 MED ORDER — LORATADINE 10 MG PO TABS
10.0000 mg | ORAL_TABLET | ORAL | Status: DC
  Administered 2013-11-11: 10 mg via ORAL
  Filled 2013-11-11: qty 1

## 2013-11-11 NOTE — Discharge Instructions (Signed)

## 2013-11-15 ENCOUNTER — Telehealth: Payer: Self-pay | Admitting: Neurology

## 2013-11-15 NOTE — Telephone Encounter (Signed)
Called patient to inform him that he could bring the form to the office and drop it off with Medical records and that he may have to pay for it to get filled out and if could take up to 14 business days. Patient stated that he had an appt with Dr. Anne Hahn and forgot to bring it with him and that Dr. Anne Hahn usually fills it out for him and fax it back for him. I told the patient that if would send Dr. Anne Hahn a message concerning this matter. Patient verbalized understanding. Please advise

## 2013-11-15 NOTE — Telephone Encounter (Signed)
Pt called and stated that he received a form from his insurance company regarding his waiver of premium. He stated that he normally brings it with him when he see's Dr. Anne Hahn, but he forgot it when he was here last week.  He asked if it would be possible for him to bring the form in and have it completed by Dr. Anne Hahn and faxed to his insurance company before 11-22-13.  Please call to discuss.  Thank you

## 2013-11-15 NOTE — Telephone Encounter (Signed)
We will fill the form out once we get it, and it back to the patient. Apparently, this needs to be done before 11/22/2013.

## 2013-11-16 ENCOUNTER — Telehealth: Payer: Self-pay | Admitting: Neurology

## 2013-11-16 MED ORDER — ARIPIPRAZOLE 2 MG PO TABS: 2.0000 mg | ORAL_TABLET | Freq: Every day | ORAL | Status: DC

## 2013-11-16 NOTE — Telephone Encounter (Signed)
Patient is calling stating that he is taking these medications zoloft, diazepam and clonazepam and he seems to be depressed and would like Dr. Anne HahnWillis to give him a call back please. Patient next appt is 05/13/14 with Dr. Anne HahnWillis. Please advise

## 2013-11-16 NOTE — Telephone Encounter (Signed)
Patient says his Mom and sister thinks he seems to be getting depressed--patient would like to discuss medications he is taking--

## 2013-11-16 NOTE — Telephone Encounter (Signed)
I called patient. The patient is having increasing problems with depression in part related to his declining health. The patient is on maximum doses of Zoloft and high doses of amitriptyline. I'll add Abilify to his regimen. The patient is also having issues with impaction of bowel, the patient has stopped his Lomotil and his cholestyramine. The patient did have chronic issues with diarrhea. I have recommended MiraLax if he goes more than 2 days without a bowel movement. If this is not helpful, I will try Linzess for him. The patient will contact me if he is having ongoing issues.

## 2013-11-17 DIAGNOSIS — Z0289 Encounter for other administrative examinations: Secondary | ICD-10-CM

## 2013-11-18 ENCOUNTER — Telehealth: Payer: Self-pay | Admitting: Neurology

## 2013-11-18 MED ORDER — PERPHENAZINE 2 MG PO TABS: 2.0000 mg | ORAL_TABLET | Freq: Two times a day (BID) | ORAL | Status: DC

## 2013-11-18 NOTE — Telephone Encounter (Signed)
I called patient. Abilify is too expensive, we will change to perphenazine, taking 2 mg twice daily.

## 2013-11-18 NOTE — Telephone Encounter (Signed)
Patient is concerned over the cost of Abilify. He said he does not have $300 a month to pay for this medication. Patient is requesting samples or another medication. Please call to advise.

## 2013-11-19 ENCOUNTER — Telehealth: Payer: Self-pay | Admitting: Neurology

## 2013-11-19 NOTE — Telephone Encounter (Signed)
The JC virus antibody panel is negative. Okay to continue Tysabri.

## 2013-11-29 ENCOUNTER — Other Ambulatory Visit: Payer: Self-pay | Admitting: Neurology

## 2013-11-29 MED ORDER — DIPHENOXYLATE-ATROPINE 2.5-0.025 MG PO TABS: ORAL_TABLET | ORAL | Status: DC

## 2013-11-29 NOTE — Telephone Encounter (Signed)
Rx signed and faxed.

## 2013-12-02 ENCOUNTER — Ambulatory Visit
Admission: RE | Admit: 2013-12-02 | Discharge: 2013-12-02 | Disposition: A | Discharge status: Home / Self Care | Payer: Medicare Other | Source: Ambulatory Visit | Attending: Neurology | Admitting: Neurology

## 2013-12-02 DIAGNOSIS — G8250 Quadriplegia, unspecified: Secondary | ICD-10-CM

## 2013-12-02 DIAGNOSIS — G35 Multiple sclerosis: Secondary | ICD-10-CM

## 2013-12-02 MED ORDER — GADOBENATE DIMEGLUMINE 529 MG/ML IV SOLN
18.0000 mL | Freq: Once | INTRAVENOUS | Status: AC | PRN
  Administered 2013-12-02: 18 mL via INTRAVENOUS

## 2013-12-03 ENCOUNTER — Telehealth: Payer: Self-pay | Admitting: Neurology

## 2013-12-03 NOTE — Telephone Encounter (Signed)
I called the patient. MRI the brain done shows no changes from the one done in June 2014. No enhancing lesions.

## 2013-12-10 ENCOUNTER — Encounter (HOSPITAL_COMMUNITY): Payer: Self-pay

## 2013-12-10 ENCOUNTER — Encounter (HOSPITAL_COMMUNITY)
Admission: RE | Admit: 2013-12-10 | Discharge: 2013-12-10 | Disposition: A | Discharge status: Home / Self Care | Payer: Medicare Other | Source: Ambulatory Visit | Attending: Neurology | Admitting: Neurology

## 2013-12-10 VITALS — BP 106/77 | HR 87 | Temp 98.3°F | Resp 20

## 2013-12-10 DIAGNOSIS — G35 Multiple sclerosis: Secondary | ICD-10-CM | POA: Insufficient documentation

## 2013-12-10 MED ORDER — SODIUM CHLORIDE 0.9 % IV SOLN
300.0000 mg | INTRAVENOUS | Status: DC
  Administered 2013-12-10: 300 mg via INTRAVENOUS
  Filled 2013-12-10: qty 15

## 2013-12-10 MED ORDER — LORATADINE 10 MG PO TABS
10.0000 mg | ORAL_TABLET | ORAL | Status: DC
  Administered 2013-12-10: 10 mg via ORAL
  Filled 2013-12-10: qty 1

## 2013-12-10 MED ORDER — SODIUM CHLORIDE 0.9 % IV SOLN
Freq: Once | INTRAVENOUS | Status: AC
  Administered 2013-12-10: 12:00:00 via INTRAVENOUS

## 2013-12-10 MED ORDER — ACETAMINOPHEN 325 MG PO TABS
650.0000 mg | ORAL_TABLET | ORAL | Status: DC
  Administered 2013-12-10: 650 mg via ORAL
  Filled 2013-12-10: qty 2

## 2013-12-10 NOTE — Progress Notes (Signed)
Patient unable to stay for suggested 1 hour observation post Tysabri infusion. Instructed to call Dr. Anne Hahn' office for problems and concerns once discharged from Short Stay. Verbalizes understanding.

## 2013-12-10 NOTE — Discharge Instructions (Signed)

## 2013-12-30 ENCOUNTER — Other Ambulatory Visit: Payer: Self-pay | Admitting: Neurology

## 2014-01-07 ENCOUNTER — Encounter (HOSPITAL_COMMUNITY)
Admission: RE | Admit: 2014-01-07 | Discharge: 2014-01-07 | Disposition: A | Discharge status: Home / Self Care | Payer: Medicare Other | Source: Ambulatory Visit | Attending: Neurology | Admitting: Neurology

## 2014-01-07 ENCOUNTER — Encounter (HOSPITAL_COMMUNITY): Payer: Self-pay

## 2014-01-07 VITALS — BP 114/80 | HR 86 | Temp 96.7°F | Resp 18

## 2014-01-07 DIAGNOSIS — G35 Multiple sclerosis: Secondary | ICD-10-CM | POA: Insufficient documentation

## 2014-01-07 MED ORDER — LORATADINE 10 MG PO TABS
10.0000 mg | ORAL_TABLET | ORAL | Status: AC
  Administered 2014-01-07: 10 mg via ORAL
  Filled 2014-01-07: qty 1

## 2014-01-07 MED ORDER — ACETAMINOPHEN 325 MG PO TABS
650.0000 mg | ORAL_TABLET | ORAL | Status: AC
  Administered 2014-01-07: 650 mg via ORAL
  Filled 2014-01-07: qty 2

## 2014-01-07 MED ORDER — SODIUM CHLORIDE 0.9 % IV SOLN
300.0000 mg | INTRAVENOUS | Status: DC
  Administered 2014-01-07: 300 mg via INTRAVENOUS
  Filled 2014-01-07: qty 15

## 2014-01-07 MED ORDER — SODIUM CHLORIDE 0.9 % IV SOLN
INTRAVENOUS | Status: DC
  Administered 2014-01-07: 12:00:00 via INTRAVENOUS

## 2014-01-07 NOTE — Progress Notes (Signed)
Pt states today that he only stays in motor scooter at home for about 3 hours per day. Most of the time he is in bed at home due to "my sciatic leg pain"

## 2014-01-07 NOTE — Progress Notes (Signed)
After his TYSABRI infusion he only stayed 15 minutes of the suggested observation time because he was having back pain and wanted to go home and lie down. He will contact Dr Anne HahnWillis for any problems or concerns.

## 2014-01-25 ENCOUNTER — Other Ambulatory Visit: Payer: Self-pay | Admitting: Neurology

## 2014-02-04 ENCOUNTER — Encounter (HOSPITAL_COMMUNITY)
Admission: RE | Admit: 2014-02-04 | Discharge: 2014-02-04 | Disposition: A | Discharge status: Home / Self Care | Payer: Medicare Other | Source: Ambulatory Visit | Attending: Neurology | Admitting: Neurology

## 2014-02-04 VITALS — BP 120/79 | HR 106 | Temp 97.2°F | Resp 18

## 2014-02-04 DIAGNOSIS — G35 Multiple sclerosis: Secondary | ICD-10-CM | POA: Insufficient documentation

## 2014-02-04 MED ORDER — LORATADINE 10 MG PO TABS
10.0000 mg | ORAL_TABLET | ORAL | Status: AC
  Administered 2014-02-04: 10 mg via ORAL
  Filled 2014-02-04: qty 1

## 2014-02-04 MED ORDER — SODIUM CHLORIDE 0.9 % IV SOLN
INTRAVENOUS | Status: DC
  Administered 2014-02-04: 12:00:00 via INTRAVENOUS

## 2014-02-04 MED ORDER — ACETAMINOPHEN 325 MG PO TABS
650.0000 mg | ORAL_TABLET | ORAL | Status: AC
  Administered 2014-02-04: 650 mg via ORAL
  Filled 2014-02-04: qty 2

## 2014-02-04 MED ORDER — SODIUM CHLORIDE 0.9 % IV SOLN
300.0000 mg | INTRAVENOUS | Status: DC
  Administered 2014-02-04: 300 mg via INTRAVENOUS
  Filled 2014-02-04: qty 15

## 2014-02-04 NOTE — Progress Notes (Signed)
Pt has requested to leave, he states his bottom hurts and cannot sit in his chair any longer.  Pt's post saline infusion is complete and VSS.  Pt states he feels fine.  Pt dc'd home with family and friend.

## 2014-03-01 ENCOUNTER — Other Ambulatory Visit: Payer: Self-pay | Admitting: Neurology

## 2014-03-01 DIAGNOSIS — G35 Multiple sclerosis: Secondary | ICD-10-CM

## 2014-03-02 ENCOUNTER — Encounter (HOSPITAL_COMMUNITY): Payer: Self-pay | Admitting: Emergency Medicine

## 2014-03-02 ENCOUNTER — Emergency Department (HOSPITAL_COMMUNITY): Payer: Medicare Other

## 2014-03-02 ENCOUNTER — Observation Stay (HOSPITAL_COMMUNITY)
Admission: EM | Admit: 2014-03-02 | Discharge: 2014-03-03 | Disposition: A | Discharge status: Home / Self Care | Payer: Medicare Other | Attending: Internal Medicine | Admitting: Internal Medicine

## 2014-03-02 DIAGNOSIS — J189 Pneumonia, unspecified organism: Principal | ICD-10-CM | POA: Diagnosis present

## 2014-03-02 DIAGNOSIS — Z87891 Personal history of nicotine dependence: Secondary | ICD-10-CM | POA: Insufficient documentation

## 2014-03-02 DIAGNOSIS — R339 Retention of urine, unspecified: Secondary | ICD-10-CM | POA: Insufficient documentation

## 2014-03-02 DIAGNOSIS — Z79899 Other long term (current) drug therapy: Secondary | ICD-10-CM | POA: Insufficient documentation

## 2014-03-02 DIAGNOSIS — G8114 Spastic hemiplegia affecting left nondominant side: Secondary | ICD-10-CM | POA: Diagnosis present

## 2014-03-02 DIAGNOSIS — Z9089 Acquired absence of other organs: Secondary | ICD-10-CM | POA: Insufficient documentation

## 2014-03-02 DIAGNOSIS — E039 Hypothyroidism, unspecified: Secondary | ICD-10-CM | POA: Insufficient documentation

## 2014-03-02 DIAGNOSIS — G35 Multiple sclerosis: Secondary | ICD-10-CM | POA: Diagnosis present

## 2014-03-02 DIAGNOSIS — E7800 Pure hypercholesterolemia, unspecified: Secondary | ICD-10-CM | POA: Insufficient documentation

## 2014-03-02 DIAGNOSIS — K219 Gastro-esophageal reflux disease without esophagitis: Secondary | ICD-10-CM | POA: Insufficient documentation

## 2014-03-02 DIAGNOSIS — I10 Essential (primary) hypertension: Secondary | ICD-10-CM | POA: Insufficient documentation

## 2014-03-02 DIAGNOSIS — R338 Other retention of urine: Secondary | ICD-10-CM

## 2014-03-02 DIAGNOSIS — R109 Unspecified abdominal pain: Secondary | ICD-10-CM | POA: Insufficient documentation

## 2014-03-02 DIAGNOSIS — G8250 Quadriplegia, unspecified: Secondary | ICD-10-CM

## 2014-03-02 DIAGNOSIS — R509 Fever, unspecified: Secondary | ICD-10-CM | POA: Diagnosis present

## 2014-03-02 LAB — BASIC METABOLIC PANEL
Anion gap: 17 — ABNORMAL HIGH (ref 5–15)
BUN: 13 mg/dL (ref 6–23)
CO2: 22 mEq/L (ref 19–32)
Calcium: 9.9 mg/dL (ref 8.4–10.5)
Chloride: 95 mEq/L — ABNORMAL LOW (ref 96–112)
Creatinine, Ser: 0.59 mg/dL (ref 0.50–1.35)
GFR calc Af Amer: >90 mL/min (ref >90)
GFR calc non Af Amer: >90 mL/min (ref >90)
Glucose, Bld: 136 mg/dL — ABNORMAL HIGH (ref 70–99)
Potassium: 3.7 mEq/L (ref 3.7–5.3)
Sodium: 134 mEq/L — ABNORMAL LOW (ref 137–147)

## 2014-03-02 LAB — CBC WITH DIFFERENTIAL/PLATELET
Basophils Absolute: 0 10*3/uL (ref 0.0–0.1)
Basophils Relative: 0 % (ref 0–1)
Eosinophils Absolute: 0 10*3/uL (ref 0.0–0.7)
Eosinophils Relative: 0 % (ref 0–5)
HCT: 43.1 % (ref 39.0–52.0)
Hemoglobin: 14.7 g/dL (ref 13.0–17.0)
Lymphocytes Relative: 5 % — ABNORMAL LOW (ref 12–46)
Lymphs Abs: 0.9 10*3/uL (ref 0.7–4.0)
MCH: 29.9 pg (ref 26.0–34.0)
MCHC: 34.1 g/dL (ref 30.0–36.0)
MCV: 87.6 fL (ref 78.0–100.0)
Monocytes Absolute: 1.2 10*3/uL — ABNORMAL HIGH (ref 0.1–1.0)
Monocytes Relative: 6 % (ref 3–12)
Neutro Abs: 18.5 10*3/uL — ABNORMAL HIGH (ref 1.7–7.7)
Neutrophils Relative %: 89 % — ABNORMAL HIGH (ref 43–77)
Platelets: 166 10*3/uL (ref 150–400)
RBC: 4.92 MIL/uL (ref 4.22–5.81)
RDW: 13.7 % (ref 11.5–15.5)
WBC: 20.6 10*3/uL — ABNORMAL HIGH (ref 4.0–10.5)

## 2014-03-02 LAB — URINE MICROSCOPIC-ADD ON

## 2014-03-02 LAB — LACTIC ACID, PLASMA: Lactic Acid, Venous: 0.2 mmol/L — ABNORMAL LOW (ref 0.5–2.2)

## 2014-03-02 LAB — URINALYSIS, ROUTINE W REFLEX MICROSCOPIC
Bilirubin Urine: NEGATIVE
Glucose, UA: NEGATIVE mg/dL
Ketones, ur: NEGATIVE mg/dL
Leukocytes, UA: NEGATIVE
Nitrite: NEGATIVE
Protein, ur: NEGATIVE mg/dL
Specific Gravity, Urine: 1.016 (ref 1.005–1.030)
Urobilinogen, UA: 0.2 mg/dL (ref 0.0–1.0)
pH: 5.5 (ref 5.0–8.0)

## 2014-03-02 MED ORDER — TAMSULOSIN HCL 0.4 MG PO CAPS
0.4000 mg | ORAL_CAPSULE | Freq: Two times a day (BID) | ORAL | Status: DC
  Administered 2014-03-03: 0.4 mg via ORAL
  Filled 2014-03-02 (×3): qty 1

## 2014-03-02 MED ORDER — HYDROCHLOROTHIAZIDE 10 MG/ML ORAL SUSPENSION
6.2500 mg | Freq: Every day | ORAL | Status: DC
  Filled 2014-03-02 (×2): qty 1.25

## 2014-03-02 MED ORDER — SODIUM CHLORIDE 0.9 % IV SOLN: 300.0000 mg | INTRAVENOUS | Status: DC

## 2014-03-02 MED ORDER — DEXTROSE 5 % IV SOLN: 500.0000 mg | INTRAVENOUS | Status: DC

## 2014-03-02 MED ORDER — LISINOPRIL 5 MG PO TABS
5.0000 mg | ORAL_TABLET | Freq: Every day | ORAL | Status: DC
  Administered 2014-03-03: 5 mg via ORAL
  Filled 2014-03-02: qty 1

## 2014-03-02 MED ORDER — DEXTROSE 5 % IV SOLN
500.0000 mg | Freq: Once | INTRAVENOUS | Status: AC
  Administered 2014-03-02: 500 mg via INTRAVENOUS
  Filled 2014-03-02: qty 500

## 2014-03-02 MED ORDER — CLONAZEPAM 1 MG PO TABS
1.0000 mg | ORAL_TABLET | Freq: Two times a day (BID) | ORAL | Status: DC
  Administered 2014-03-03: 1 mg via ORAL
  Filled 2014-03-02: qty 1

## 2014-03-02 MED ORDER — SODIUM CHLORIDE 0.9 % IV SOLN
INTRAVENOUS | Status: AC
  Administered 2014-03-02: via INTRAVENOUS

## 2014-03-02 MED ORDER — DEXTROSE 5 % IV SOLN: 1.0000 g | INTRAVENOUS | Status: DC

## 2014-03-02 MED ORDER — LISINOPRIL-HYDROCHLOROTHIAZIDE 10-12.5 MG PO TABS: 0.5000 | ORAL_TABLET | Freq: Every day | ORAL | Status: DC

## 2014-03-02 MED ORDER — SERTRALINE HCL 100 MG PO TABS
200.0000 mg | ORAL_TABLET | Freq: Every day | ORAL | Status: DC
  Filled 2014-03-02 (×2): qty 2

## 2014-03-02 MED ORDER — LEVOTHYROXINE SODIUM 100 MCG PO TABS
100.0000 ug | ORAL_TABLET | Freq: Every day | ORAL | Status: DC
  Administered 2014-03-03: 100 ug via ORAL
  Filled 2014-03-02 (×2): qty 1

## 2014-03-02 MED ORDER — TERAZOSIN HCL 5 MG PO CAPS
10.0000 mg | ORAL_CAPSULE | Freq: Every day | ORAL | Status: DC
  Administered 2014-03-03: 10 mg via ORAL
  Filled 2014-03-02: qty 2

## 2014-03-02 MED ORDER — DANTROLENE SODIUM 100 MG PO CAPS
100.0000 mg | ORAL_CAPSULE | Freq: Three times a day (TID) | ORAL | Status: DC
  Administered 2014-03-03: 100 mg via ORAL
  Filled 2014-03-02 (×4): qty 1

## 2014-03-02 MED ORDER — DEXTROSE 5 % IV SOLN
1.0000 g | Freq: Once | INTRAVENOUS | Status: AC
  Administered 2014-03-02: 1 g via INTRAVENOUS
  Filled 2014-03-02: qty 10

## 2014-03-02 MED ORDER — VITAMIN D (ERGOCALCIFEROL) 1.25 MG (50000 UNIT) PO CAPS: 50000.0000 [IU] | ORAL_CAPSULE | ORAL | Status: DC

## 2014-03-02 MED ORDER — IBUPROFEN 200 MG PO TABS
200.0000 mg | ORAL_TABLET | ORAL | Status: DC | PRN
  Filled 2014-03-02: qty 1

## 2014-03-02 MED ORDER — PERPHENAZINE 2 MG PO TABS
2.0000 mg | ORAL_TABLET | Freq: Two times a day (BID) | ORAL | Status: DC
  Administered 2014-03-03: 2 mg via ORAL
  Filled 2014-03-02 (×3): qty 1

## 2014-03-02 NOTE — Progress Notes (Signed)
  CARE MANAGEMENT ED NOTE 03/02/2014  Patient:  Samuel DawsonEGRAM,Omran J   Account Number:  [id number]  Date Initiated:  03/02/2014  Documentation initiated by:  Radford PaxFERRERO,Jodeci Roarty  Subjective/Objective Assessment:   Patient presents to Ed with suprapubic pain and could not urinate     Subjective/Objective Assessment Detail:   Patient with pmhx of MS, quadripalegic     Action/Plan:   Action/Plan Detail:   Anticipated DC Date:       Status Recommendation to Physician:   Result of Recommendation:    Other ED Services  Consult Working Plan    DC Planning Services  CM consult  Other    Choice offered to / List presented to:            Status of service:  Completed, signed off  ED Comments:   ED Comments Detail:  [facility].  EDCM spoke to patient at bedside.  Patient reports he may have provlems paying for his medications towards the end of the year.  EDCM informed patient to contact his insurance company if he begins to have difficulty paying for his medications.  EDCM also provided patient with discount pharmacy list, and websites needymeds.org and Good https://figueroa.info/X.com for medication assistance. [facility] also informed patient that he may call the manufacturer of a prescription as they may have patient assistance programs for that particular drug.  Patient reports his neurologist doesnot give him free samples but his urologist and primary care doctor do.  Patient thankful for resources.  No further EDCM needs at this time.

## 2014-03-02 NOTE — ED Notes (Signed)
Patient transported to CT 

## 2014-03-02 NOTE — H&P (Signed)
PCP:   Delorse LekBURNETT,BRENT A, MD   Chief Complaint:  Cant pee  HPI: 53 yo male h/o multiple sclerosis, paraplegic comes in with suprapubic pain and could not urinate since this am.  Has had fevers/chills since yesterday.  No cough.  Some sob.  No swelling.  No rashes.  No dysuria.  No n/v/d.  He has had a catheter placed since arrival and his suprapubic pain is gone.  Not recently hospitalized.  On abx for uti frequently.    Review of Systems:  Positive and negative as per HPI otherwise all other systems are negative  Past Medical History: Past Medical History  Diagnosis Date  . Hypertension   . GERD (gastroesophageal reflux disease)   . Hypercholesteremia   . Incontinence of urine   . Hypothyroidism   . Vitamin D deficiency   . Chronic back pain   . Neuromuscular disorder     Multiple Scerlosis  . Gait disorder   . 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 Multiple sclerosis     1993, JC virus negative 2/14   Past Surgical History  Procedure Laterality Date  . Appendectomy      age 89    Medications: Prior to Admission medications   Medication Sig Start Date End Date Taking? Authorizing Provider  amitriptyline (ELAVIL) 100 MG tablet take 1 tablet by mouth at bedtime 01/25/14  Yes York Spanielharles K Willis, MD  baclofen (LIORESAL) 20 MG tablet take 2 tablets by mouth three times a day 12/30/13  Yes York Spanielharles K Willis, MD  clonazePAM (KLONOPIN) 1 MG tablet Take 1 mg by mouth 2 (two) times daily.   Yes Historical Provider, MD  Cranberry-Vitamin C-Probiotic (AZO CRANBERRY PO) Take by mouth 2 (two) times daily.   Yes Historical Provider, MD  dantrolene (DANTRIUM) 100 MG capsule Take 100 mg by mouth 3 (three) times daily.   Yes Historical Provider, MD  diazepam (VALIUM) 10 MG tablet take 1 tablet by mouth AT NIGHT IF NEEDED 10/03/13  Yes York Spanielharles K Willis, MD  ibuprofen (ADVIL,MOTRIN) 200 MG tablet Take 200 mg by mouth as needed (takes 3 tablets as needed).   Yes Historical Provider, MD  levothyroxine  (SYNTHROID, LEVOTHROID) 100 MCG tablet Take 100 mcg by mouth daily.     Yes Historical Provider, MD  lisinopril-hydrochlorothiazide (PRINZIDE,ZESTORETIC) 10-12.5 MG per tablet Take 0.5 tablets by mouth daily.   Yes Historical Provider, MD  omeprazole (PRILOSEC) 20 MG capsule Take 20 mg by mouth daily.     Yes Historical Provider, MD  perphenazine (TRILAFON) 2 MG tablet Take 1 tablet (2 mg total) by mouth 2 (two) times daily. 11/18/13  Yes York Spanielharles K Willis, MD  sertraline (ZOLOFT) 100 MG tablet Take 200 mg by mouth at bedtime.    Yes Historical Provider, MD  sodium chloride 0.9 % SOLN 100 mL with natalizumab 300 MG/15ML CONC 300 mg Inject 300 mg into the vein every 28 (twenty-eight) days.     Yes Historical Provider, MD  Tamsulosin HCl (FLOMAX) 0.4 MG CAPS Take 0.4 mg by mouth 2 (two) times daily.    Yes Historical Provider, MD  terazosin (HYTRIN) 10 MG capsule Take 10 mg by mouth daily.     Yes Historical Provider, MD  tiZANidine (ZANAFLEX) 4 MG tablet take 1 tablet by mouth every 4 hours if needed 11/05/13  Yes York Spanielharles K Willis, MD  Vitamin D, Ergocalciferol, (DRISDOL) 50000 UNITS CAPS Take 50,000 Units by mouth every 7 (seven) days. Friday only   Yes Historical Provider, MD  Allergies:   Allergies  Allergen Reactions  . Penicillins Other (See Comments)    40 years ago "unknown"    Social History:  reports that he quit smoking about 28 years ago. His smoking use included Cigarettes. He smoked 2.00 packs per day. He has never used smokeless tobacco. He reports that he does not drink alcohol or use illicit drugs.  Family History: Family History  Problem Relation Age of Onset  . Stroke Father   . Alcoholism Brother     Physical Exam: Filed Vitals:   03/02/14 1657 03/02/14 2024  BP: 162/86 95/63  Pulse: 127 92  Temp: 98.5 F (36.9 C)   TempSrc: Oral   Resp: 18   SpO2: 96% 96%   General appearance: alert, cooperative and no distress Head: Normocephalic, without obvious  abnormality, atraumatic Eyes: negative Nose: Nares normal. Septum midline. Mucosa normal. No drainage or sinus tenderness. Neck: no JVD and supple, symmetrical, trachea midline Lungs: clear to auscultation bilaterally Heart: regular rate and rhythm, S1, S2 normal, no murmur, click, rub or gallop Abdomen: soft, non-tender; bowel sounds normal; no masses,  no organomegaly Extremities: extremities normal, atraumatic, no cyanosis or edema Pulses: 2+ and symmetric Skin: Skin color, texture, turgor normal. No rashes or lesions Neurologic: Mental status: Alert, oriented, thought content appropriate Cranial nerves: normal paraplegic  Labs on Admission:   Recent Labs  03/02/14 1723  NA 134*  K 3.7  CL 95*  CO2 22  GLUCOSE 136*  BUN 13  CREATININE 0.59  CALCIUM 9.9    Recent Labs  03/02/14 1723  WBC 20.6*  NEUTROABS 18.5*  HGB 14.7  HCT 43.1  MCV 87.6  PLT 166   Radiological Exams on Admission: Ct Abdomen Pelvis Wo Contrast  03/02/2014   CLINICAL DATA:  Nausea and vomiting.  EXAM: CT ABDOMEN AND PELVIS WITHOUT CONTRAST  TECHNIQUE: Multidetector CT imaging of the abdomen and pelvis was performed following the standard protocol without IV contrast.  COMPARISON:  None.  FINDINGS: Dependent changes noted in the lung stress set there is atelectasis identified in both lung bases. No pleural will or pericardial effusion identified.  No focal liver abnormality identified. The gallbladder is normal. No biliary dilatation. Normal appearance of the pancreas. The spleen is enlarged measuring 13.7 cm.  The adrenal glands are both normal. No nephrolithiasis or hydronephrosis identified. No ureteral lithiasis or hydroureter. The urinary bladder is partially collapsed chronic Foley catheter balloon. Innumerable tiny stones are identified within the dependent portion of the urinary bladder. The prostate gland and seminal vesicles are unremarkable.  Normal caliber of the abdominal aorta. No aneurysm. No  upper abdominal adenopathy. There is no pelvic or inguinal adenopathy. No free fluid or fluid collections within the abdomen or pelvis.  The stomach is normal. The small bowel loops have a normal course and caliber. Normal appearance of the colon.  Review of the visualized osseous structures is significant for L5-S1 degenerative disc disease.  IMPRESSION: 1. Multiple stones are identified within the urinary bladder which may account for the patient's difficulty voiding. 2. Splenomegaly. 3. L5-S1 degenerative disc disease.   Electronically Signed   By: Signa Kell M.D.   On: 03/02/2014 19:41   Dg Chest 2 View  03/02/2014   CLINICAL DATA:  Fever.  EXAM: CHEST  2 VIEW  COMPARISON:  None  FINDINGS: Normal heart size. No pleural effusion or edema. Right lung is clear. There are patchy opacities within the left midlung and left base.  IMPRESSION: 1. Left mid  lung and left base opacities suggests pneumonia.   Electronically Signed   By: Signa Kell M.D.   On: 03/02/2014 19:04    Assessment/Plan  53 yo male with urinary retention, sob, fever/chills probable pna  Principal Problem:   PNA (pneumonia)- source probably lung.  Cover for pna.  Iv rocephin and azithromycin.  Vss.  Oxygen sats are normal.    Active Problems:  Stable o/w noted   Multiple sclerosis   Spastic quadriparesis   Fever   Urinary retention-  Foley has been placed.  ua appears clean.    obs on medical bed.  Full code.  DAVID,RACHAL A 03/02/2014, 8:37 PM

## 2014-03-02 NOTE — ED Notes (Signed)
Bladder irrigated. 300+cc output. Will continue to monitor.

## 2014-03-02 NOTE — ED Provider Notes (Signed)
CSN: [id number]     Arrival date & time 03/02/14  1640 History   First MD Initiated Contact with Patient 03/02/14 1714     Chief Complaint  Patient presents with  . Urinary Retention  . Fever     (Consider location/radiation/quality/duration/timing/severity/associated sxs/prior Treatment) HPI Comments: Pt with a history of MS and paraplegia of the lower extremities presents with urinary retention. He states that he hasn't been able to void since about 4:30 this morning. He feels distended in his abdomen is having lower abdominal pain. Is also had a temperature of 99 at home. He denies any cough or chest congestion. He denies any history of urinary retention although he has had recurrent UTIs. He's followed by Dr. [name] with [facility]. He had one episode of vomiting he has had ongoing nausea throughout the morning. His last urinary tract infection was treated about one month ago but is not sure what antibiotic use but none for that. He's been increasingly fatigued for the last 2 weeks and has been sleeping more.  Patient is a 53 y.o. male presenting with fever.  Fever Associated symptoms: nausea and vomiting   Associated symptoms: no chest pain, no chills, no congestion, no cough, no diarrhea, no headaches, no rash and no rhinorrhea     Past Medical History  Diagnosis Date  . Hypertension   . GERD (gastroesophageal reflux disease)   . Hypercholesteremia   . Incontinence of urine   . Hypothyroidism   . Vitamin D deficiency   . Chronic back pain   . Neuromuscular disorder     Multiple Scerlosis  . Gait disorder   . 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 Multiple sclerosis     1993, JC virus negative 2/14   Past Surgical History  Procedure Laterality Date  . Appendectomy      age 50   Family History  Problem Relation Age of Onset  . Stroke Father   . Alcoholism Brother    History  Substance Use Topics  . Smoking status: Former Smoker -- 2.00 packs/day    Types:  Cigarettes    Quit date: 06/17/1985  . Smokeless tobacco: Never Used  . Alcohol Use: No    Review of Systems  Constitutional: Positive for fever and fatigue. Negative for chills and diaphoresis.  HENT: Negative for congestion, rhinorrhea and sneezing.   Eyes: Negative.   Respiratory: Negative for cough, chest tightness and shortness of breath.   Cardiovascular: Negative for chest pain and leg swelling.  Gastrointestinal: Positive for nausea, vomiting and abdominal pain. Negative for diarrhea and blood in stool.  Genitourinary: Positive for difficulty urinating. Negative for frequency, hematuria and flank pain.  Musculoskeletal: Negative for arthralgias and back pain.  Skin: Negative for rash.  Neurological: Negative for dizziness, speech difficulty, weakness, numbness and headaches.      Allergies  Penicillins  Home Medications   Prior to Admission medications   Medication Sig Start Date End Date Taking? Authorizing Provider  amitriptyline (ELAVIL) 100 MG tablet take 1 tablet by mouth at bedtime 01/25/14  Yes York Spaniel, MD  baclofen (LIORESAL) 20 MG tablet take 2 tablets by mouth three times a day 12/30/13  Yes York Spaniel, MD  clonazePAM (KLONOPIN) 1 MG tablet Take 1 mg by mouth 2 (two) times daily.   Yes Historical Provider, MD  Cranberry-Vitamin C-Probiotic (AZO CRANBERRY PO) Take by mouth 2 (two) times daily.   Yes Historical Provider, MD  dantrolene (DANTRIUM) 100 MG capsule Take 100 mg  by mouth 3 (three) times daily.   Yes Historical Provider, MD  diazepam (VALIUM) 10 MG tablet take 1 tablet by mouth AT NIGHT IF NEEDED 10/03/13  Yes York Spaniel, MD  ibuprofen (ADVIL,MOTRIN) 200 MG tablet Take 200 mg by mouth as needed (takes 3 tablets as needed).   Yes Historical Provider, MD  levothyroxine (SYNTHROID, LEVOTHROID) 100 MCG tablet Take 100 mcg by mouth daily.     Yes Historical Provider, MD  lisinopril-hydrochlorothiazide (PRINZIDE,ZESTORETIC) 10-12.5 MG per  tablet Take 0.5 tablets by mouth daily.   Yes Historical Provider, MD  omeprazole (PRILOSEC) 20 MG capsule Take 20 mg by mouth daily.     Yes Historical Provider, MD  perphenazine (TRILAFON) 2 MG tablet Take 1 tablet (2 mg total) by mouth 2 (two) times daily. 11/18/13  Yes York Spaniel, MD  sertraline (ZOLOFT) 100 MG tablet Take 200 mg by mouth at bedtime.    Yes Historical Provider, MD  sodium chloride 0.9 % SOLN 100 mL with natalizumab 300 MG/15ML CONC 300 mg Inject 300 mg into the vein every 28 (twenty-eight) days.     Yes Historical Provider, MD  Tamsulosin HCl (FLOMAX) 0.4 MG CAPS Take 0.4 mg by mouth 2 (two) times daily.    Yes Historical Provider, MD  terazosin (HYTRIN) 10 MG capsule Take 10 mg by mouth daily.     Yes Historical Provider, MD  tiZANidine (ZANAFLEX) 4 MG tablet take 1 tablet by mouth every 4 hours if needed 11/05/13  Yes York Spaniel, MD  Vitamin D, Ergocalciferol, (DRISDOL) 50000 UNITS CAPS Take 50,000 Units by mouth every 7 (seven) days. Friday only   Yes Historical Provider, MD   BP 108/76  Pulse 98  Temp(Src) 98.5 F (36.9 C) (Oral)  Resp 21  SpO2 96% Physical Exam  Constitutional: He is oriented to person, place, and time. He appears well-developed and well-nourished.  HENT:  Head: Normocephalic and atraumatic.  Mouth/Throat: Oropharynx is clear and moist.  Eyes: Pupils are equal, round, and reactive to light.  Neck: Normal range of motion. Neck supple.  Cardiovascular: Normal rate, regular rhythm and normal heart sounds.   Pulmonary/Chest: Effort normal and breath sounds normal. No respiratory distress. He has no wheezes. He has no rales. He exhibits no tenderness.  Abdominal: Soft. Bowel sounds are normal. There is no tenderness. There is no rebound and no guarding.  Musculoskeletal: Normal range of motion. He exhibits no edema.  Lymphadenopathy:    He has no cervical adenopathy.  Neurological: He is alert and oriented to person, place, and time.   Paraplegia of lower extremities  Skin: Skin is warm and dry. No rash noted.  Psychiatric: He has a normal mood and affect.    ED Course  Procedures (including critical care time) Labs Review Results for orders placed during the hospital encounter of 03/02/14  URINALYSIS, ROUTINE W REFLEX MICROSCOPIC      Result Value Ref Range   Color, Urine YELLOW  YELLOW   APPearance CLEAR  CLEAR   Specific Gravity, Urine 1.016  1.005 - 1.030   pH 5.5  5.0 - 8.0   Glucose, UA NEGATIVE  NEGATIVE mg/dL   Hgb urine dipstick MODERATE (*) NEGATIVE   Bilirubin Urine NEGATIVE  NEGATIVE   Ketones, ur NEGATIVE  NEGATIVE mg/dL   Protein, ur NEGATIVE  NEGATIVE mg/dL   Urobilinogen, UA 0.2  0.0 - 1.0 mg/dL   Nitrite NEGATIVE  NEGATIVE   Leukocytes, UA NEGATIVE  NEGATIVE  CBC WITH  DIFFERENTIAL      Result Value Ref Range   WBC 20.6 (*) 4.0 - 10.5 K/uL   RBC 4.92  4.22 - 5.81 MIL/uL   Hemoglobin 14.7  13.0 - 17.0 g/dL   HCT 21.343.1  08.639.0 - 57.852.0 %   MCV 87.6  78.0 - 100.0 fL   MCH 29.9  26.0 - 34.0 pg   MCHC 34.1  30.0 - 36.0 g/dL   RDW 46.913.7  62.911.5 - 52.815.5 %   Platelets 166  150 - 400 K/uL   Neutrophils Relative % 89 (*) 43 - 77 %   Neutro Abs 18.5 (*) 1.7 - 7.7 K/uL   Lymphocytes Relative 5 (*) 12 - 46 %   Lymphs Abs 0.9  0.7 - 4.0 K/uL   Monocytes Relative 6  3 - 12 %   Monocytes Absolute 1.2 (*) 0.1 - 1.0 K/uL   Eosinophils Relative 0  0 - 5 %   Eosinophils Absolute 0.0  0.0 - 0.7 K/uL   Basophils Relative 0  0 - 1 %   Basophils Absolute 0.0  0.0 - 0.1 K/uL  BASIC METABOLIC PANEL      Result Value Ref Range   Sodium 134 (*) 137 - 147 mEq/L   Potassium 3.7  3.7 - 5.3 mEq/L   Chloride 95 (*) 96 - 112 mEq/L   CO2 22  19 - 32 mEq/L   Glucose, Bld 136 (*) 70 - 99 mg/dL   BUN 13  6 - 23 mg/dL   Creatinine, Ser 4.130.59  0.50 - 1.35 mg/dL   Calcium 9.9  8.4 - 24.410.5 mg/dL   GFR calc non Af Amer >90  >90 mL/min   GFR calc Af Amer >90  >90 mL/min   Anion gap 17 (*) 5 - 15  LACTIC ACID, PLASMA       Result Value Ref Range   Lactic Acid, Venous 0.2 (*) 0.5 - 2.2 mmol/L  URINE MICROSCOPIC-ADD ON      Result Value Ref Range   Squamous Epithelial / LPF RARE  RARE   WBC, UA 0-2  <3 WBC/hpf   RBC / HPF 7-10  <3 RBC/hpf   Crystals CA OXALATE CRYSTALS (*) NEGATIVE   Urine-Other MUCOUS PRESENT     Ct Abdomen Pelvis Wo Contrast  03/02/2014   CLINICAL DATA:  Nausea and vomiting.  EXAM: CT ABDOMEN AND PELVIS WITHOUT CONTRAST  TECHNIQUE: Multidetector CT imaging of the abdomen and pelvis was performed following the standard protocol without IV contrast.  COMPARISON:  None.  FINDINGS: Dependent changes noted in the lung stress set there is atelectasis identified in both lung bases. No pleural will or pericardial effusion identified.  No focal liver abnormality identified. The gallbladder is normal. No biliary dilatation. Normal appearance of the pancreas. The spleen is enlarged measuring 13.7 cm.  The adrenal glands are both normal. No nephrolithiasis or hydronephrosis identified. No ureteral lithiasis or hydroureter. The urinary bladder is partially collapsed chronic Foley catheter balloon. Innumerable tiny stones are identified within the dependent portion of the urinary bladder. The prostate gland and seminal vesicles are unremarkable.  Normal caliber of the abdominal aorta. No aneurysm. No upper abdominal adenopathy. There is no pelvic or inguinal adenopathy. No free fluid or fluid collections within the abdomen or pelvis.  The stomach is normal. The small bowel loops have a normal course and caliber. Normal appearance of the colon.  Review of the visualized osseous structures is significant for L5-S1 degenerative disc disease.  IMPRESSION: 1. Multiple stones are identified within the urinary bladder which may account for the patient's difficulty voiding. 2. Splenomegaly. 3. L5-S1 degenerative disc disease.   Electronically Signed   By: Signa Kell M.D.   On: 03/02/2014 19:41   Dg Chest 2 View  03/02/2014    CLINICAL DATA:  Fever.  EXAM: CHEST  2 VIEW  COMPARISON:  None  FINDINGS: Normal heart size. No pleural effusion or edema. Right lung is clear. There are patchy opacities within the left midlung and left base.  IMPRESSION: 1. Left mid lung and left base opacities suggests pneumonia.   Electronically Signed   By: Signa Kell M.D.   On: 03/02/2014 19:04      Imaging Review Ct Abdomen Pelvis Wo Contrast  03/02/2014   CLINICAL DATA:  Nausea and vomiting.  EXAM: CT ABDOMEN AND PELVIS WITHOUT CONTRAST  TECHNIQUE: Multidetector CT imaging of the abdomen and pelvis was performed following the standard protocol without IV contrast.  COMPARISON:  None.  FINDINGS: Dependent changes noted in the lung stress set there is atelectasis identified in both lung bases. No pleural will or pericardial effusion identified.  No focal liver abnormality identified. The gallbladder is normal. No biliary dilatation. Normal appearance of the pancreas. The spleen is enlarged measuring 13.7 cm.  The adrenal glands are both normal. No nephrolithiasis or hydronephrosis identified. No ureteral lithiasis or hydroureter. The urinary bladder is partially collapsed chronic Foley catheter balloon. Innumerable tiny stones are identified within the dependent portion of the urinary bladder. The prostate gland and seminal vesicles are unremarkable.  Normal caliber of the abdominal aorta. No aneurysm. No upper abdominal adenopathy. There is no pelvic or inguinal adenopathy. No free fluid or fluid collections within the abdomen or pelvis.  The stomach is normal. The small bowel loops have a normal course and caliber. Normal appearance of the colon.  Review of the visualized osseous structures is significant for L5-S1 degenerative disc disease.  IMPRESSION: 1. Multiple stones are identified within the urinary bladder which may account for the patient's difficulty voiding. 2. Splenomegaly. 3. L5-S1 degenerative disc disease.   Electronically Signed    By: Signa Kell M.D.   On: 03/02/2014 19:41   Dg Chest 2 View  03/02/2014   CLINICAL DATA:  Fever.  EXAM: CHEST  2 VIEW  COMPARISON:  None  FINDINGS: Normal heart size. No pleural effusion or edema. Right lung is clear. There are patchy opacities within the left midlung and left base.  IMPRESSION: 1. Left mid lung and left base opacities suggests pneumonia.   Electronically Signed   By: Signa Kell M.D.   On: 03/02/2014 19:04     EKG Interpretation None      MDM   Final diagnoses:  Community acquired pneumonia    Pt with increasing fatigue, chills, low grade fevers, urinary retention.  CXR shows pneumonia.  Will start rocephin, zithromax for CAP.  Pt says that he does not have a verified PCN allergy, has taken amoxil without problem.  Will also check CT abd/pelvis to assess for kidney stone given hematuria without overt signs of infection.  CT shows bladder stones that could be source for obstruction/retention.  U/a unremarkable.  Pt's BP soft, tachycardia which has improved with fluids.  Will admit to medicine    Rolan Bucco, MD 03/02/14 2217

## 2014-03-02 NOTE — ED Notes (Signed)
MD Belfi at bedside. 

## 2014-03-02 NOTE — ED Notes (Signed)
Pt has hx of MS. Pt sts that the last time he voided was 0430 this morning. Pt also c/o N/V. Pt vomited twice today. Pt still feels nauseous at this time. Family sts he had a low grade fever as well. Pt A&Ox4. Pt sts he usually urinates often.

## 2014-03-03 DIAGNOSIS — R338 Other retention of urine: Secondary | ICD-10-CM

## 2014-03-03 DIAGNOSIS — J189 Pneumonia, unspecified organism: Principal | ICD-10-CM

## 2014-03-03 DIAGNOSIS — G35 Multiple sclerosis: Secondary | ICD-10-CM

## 2014-03-03 LAB — URINE CULTURE
Colony Count: NO GROWTH
Culture: NO GROWTH

## 2014-03-03 LAB — LEGIONELLA ANTIGEN, URINE: Legionella Antigen, Urine: NEGATIVE

## 2014-03-03 LAB — CBC WITH DIFFERENTIAL/PLATELET
Basophils Absolute: 0 10*3/uL (ref 0.0–0.1)
Basophils Relative: 0 % (ref 0–1)
Eosinophils Absolute: 0.1 10*3/uL (ref 0.0–0.7)
Eosinophils Relative: 1 % (ref 0–5)
HCT: 37.5 % — ABNORMAL LOW (ref 39.0–52.0)
Hemoglobin: 12.5 g/dL — ABNORMAL LOW (ref 13.0–17.0)
Lymphocytes Relative: 16 % (ref 12–46)
Lymphs Abs: 1.9 10*3/uL (ref 0.7–4.0)
MCH: 29.7 pg (ref 26.0–34.0)
MCHC: 33.3 g/dL (ref 30.0–36.0)
MCV: 89.1 fL (ref 78.0–100.0)
Monocytes Absolute: 1 10*3/uL (ref 0.1–1.0)
Monocytes Relative: 8 % (ref 3–12)
Neutro Abs: 8.8 10*3/uL — ABNORMAL HIGH (ref 1.7–7.7)
Neutrophils Relative %: 75 % (ref 43–77)
Platelets: 149 10*3/uL — ABNORMAL LOW (ref 150–400)
RBC: 4.21 MIL/uL — ABNORMAL LOW (ref 4.22–5.81)
RDW: 13.8 % (ref 11.5–15.5)
WBC: 11.7 10*3/uL — ABNORMAL HIGH (ref 4.0–10.5)

## 2014-03-03 LAB — BASIC METABOLIC PANEL
Anion gap: 13 (ref 5–15)
BUN: 12 mg/dL (ref 6–23)
CO2: 24 mEq/L (ref 19–32)
Calcium: 9.4 mg/dL (ref 8.4–10.5)
Chloride: 99 mEq/L (ref 96–112)
Creatinine, Ser: 0.59 mg/dL (ref 0.50–1.35)
GFR calc Af Amer: >90 mL/min (ref >90)
GFR calc non Af Amer: >90 mL/min (ref >90)
Glucose, Bld: 110 mg/dL — ABNORMAL HIGH (ref 70–99)
Potassium: 3.5 mEq/L — ABNORMAL LOW (ref 3.7–5.3)
Sodium: 136 mEq/L — ABNORMAL LOW (ref 137–147)

## 2014-03-03 LAB — STREP PNEUMONIAE URINARY ANTIGEN: Strep Pneumo Urinary Antigen: NEGATIVE

## 2014-03-03 MED ORDER — DIAZEPAM 5 MG PO TABS: 10.0000 mg | ORAL_TABLET | Freq: Every evening | ORAL | Status: DC | PRN

## 2014-03-03 MED ORDER — BACLOFEN 20 MG PO TABS
40.0000 mg | ORAL_TABLET | Freq: Three times a day (TID) | ORAL | Status: DC
  Administered 2014-03-03: 40 mg via ORAL
  Filled 2014-03-03 (×2): qty 2

## 2014-03-03 MED ORDER — TIZANIDINE HCL 4 MG PO TABS
4.0000 mg | ORAL_TABLET | ORAL | Status: DC | PRN
  Administered 2014-03-03: 4 mg via ORAL
  Filled 2014-03-03: qty 1

## 2014-03-03 MED ORDER — LEVOFLOXACIN 750 MG PO TABS: 750.0000 mg | ORAL_TABLET | Freq: Every day | ORAL | Status: DC

## 2014-03-03 MED ORDER — AMITRIPTYLINE HCL 100 MG PO TABS
100.0000 mg | ORAL_TABLET | Freq: Every day | ORAL | Status: DC
  Filled 2014-03-03: qty 1

## 2014-03-03 NOTE — Discharge Summary (Addendum)
Physician Discharge Summary  Samuel [name] XBJ:[id number] DOB: Jan 20, 1961 DOA: 03/02/2014  PCP: [name], MD  Admit date: 03/02/2014 Discharge date: 03/03/2014  Time spent: 45 minutes  Recommendations for Outpatient Follow-up:  -Will be discharged home today. -To complete 7 days of levaquin. -Will need to see Dr. [name] in the office in 10 days.   Discharge Diagnoses:  Principal Problem:   PNA (pneumonia) Active Problems:   Multiple sclerosis   Spastic quadriparesis   Fever   Pneumonia   Acute retention of urine   Discharge Condition: Stable and improved  Filed Weights   03/02/14 2218  Weight: 77.9 kg (171 lb 11.8 oz)    History of present illness:  53 yo male h/o multiple sclerosis, paraplegic comes in with suprapubic pain and could not urinate since this am. Has had fevers/chills since yesterday. No cough. Some sob. No swelling. No rashes. No dysuria. No n/v/d. He has had a catheter placed since arrival and his suprapubic pain is gone. Not recently hospitalized. On abx for uti frequently. Hospitalist admission was requested.   Hospital Course:   Acute Urinary Retention -With multiple stones noted on CT scan in the bladder. -Discussed via phone with GU, Dr. Vernie Ammons: plan to keep foley in place and can follow up with Dr. [name] in the office in 10 days for further recommendations. -No evidence of UTI on UA (urine cx pending).  CAP -Radiographic finding only as he has had no symptoms. -Levaquin for 7 days.  Procedures:  None   Consultations:  GU  Discharge Instructions      Discharge Instructions   Continue foley catheter    Complete by:  As directed      Diet - low sodium heart healthy    Complete by:  As directed      Discontinue IV    Complete by:  As directed      Increase activity slowly    Complete by:  As directed             Medication List    STOP taking these medications       sodium chloride 0.9 % SOLN 100 mL with  natalizumab 300 MG/15ML CONC 300 mg      TAKE these medications       amitriptyline 100 MG tablet  Commonly known as:  ELAVIL  take 1 tablet by mouth at bedtime     AZO CRANBERRY PO  Take by mouth 2 (two) times daily.     baclofen 20 MG tablet  Commonly known as:  LIORESAL  take 2 tablets by mouth three times a day     clonazePAM 1 MG tablet  Commonly known as:  KLONOPIN  Take 1 mg by mouth 2 (two) times daily.     dantrolene 100 MG capsule  Commonly known as:  DANTRIUM  Take 100 mg by mouth 3 (three) times daily.     diazepam 10 MG tablet  Commonly known as:  VALIUM  take 1 tablet by mouth AT NIGHT IF NEEDED     ibuprofen 200 MG tablet  Commonly known as:  ADVIL,MOTRIN  Take 200 mg by mouth as needed (takes 3 tablets as needed).     levofloxacin 750 MG tablet  Commonly known as:  LEVAQUIN  Take 1 tablet (750 mg total) by mouth daily. For 7 days     levothyroxine 100 MCG tablet  Commonly known as:  SYNTHROID, LEVOTHROID  Take 100 mcg by mouth daily.  lisinopril-hydrochlorothiazide 10-12.5 MG per tablet  Commonly known as:  PRINZIDE,ZESTORETIC  Take 0.5 tablets by mouth daily.     omeprazole 20 MG capsule  Commonly known as:  PRILOSEC  Take 20 mg by mouth daily.     perphenazine 2 MG tablet  Commonly known as:  TRILAFON  Take 1 tablet (2 mg total) by mouth 2 (two) times daily.     sertraline 100 MG tablet  Commonly known as:  ZOLOFT  Take 200 mg by mouth at bedtime.     tamsulosin 0.4 MG Caps capsule  Commonly known as:  FLOMAX  Take 0.4 mg by mouth 2 (two) times daily.     terazosin 10 MG capsule  Commonly known as:  HYTRIN  Take 10 mg by mouth daily.     tiZANidine 4 MG tablet  Commonly known as:  ZANAFLEX  take 1 tablet by mouth every 4 hours if needed     Vitamin D (Ergocalciferol) 50000 UNITS Caps capsule  Commonly known as:  DRISDOL  Take 50,000 Units by mouth every 7 (seven) days. Friday only       Allergies  Allergen Reactions  .  Penicillins Other (See Comments)    40 years ago "unknown"   Follow-up Information   Follow up with BURNETT,BRENT A, MD. Schedule an appointment as soon as possible for a visit in 2 weeks.   Specialty:  Family Medicine   Contact information:   [address] [place] [address] [phone]       Follow up with [facility], MD. Schedule an appointment as soon as possible for a visit in 10 days.   Specialty:  Urology   Contact information:   [address] [place] [address] [phone]        The results of significant diagnostics from this hospitalization (including imaging, microbiology, ancillary and laboratory) are listed below for reference.    Significant Diagnostic Studies: Ct Abdomen Pelvis Wo Contrast  03/02/2014   CLINICAL DATA:  Nausea and vomiting.  EXAM: CT ABDOMEN AND PELVIS WITHOUT CONTRAST  TECHNIQUE: Multidetector CT imaging of the abdomen and pelvis was performed following the standard protocol without IV contrast.  COMPARISON:  None.  FINDINGS: Dependent changes noted in the lung stress set there is atelectasis identified in both lung bases. No pleural will or pericardial effusion identified.  No focal liver abnormality identified. The gallbladder is normal. No biliary dilatation. Normal appearance of the pancreas. The spleen is enlarged measuring 13.7 cm.  The adrenal glands are both normal. No nephrolithiasis or hydronephrosis identified. No ureteral lithiasis or hydroureter. The urinary bladder is partially collapsed chronic Foley catheter balloon. Innumerable tiny stones are identified within the dependent portion of the urinary bladder. The prostate gland and seminal vesicles are unremarkable.  Normal caliber of the abdominal aorta. No aneurysm. No upper abdominal adenopathy. There is no pelvic or inguinal adenopathy. No free fluid or fluid collections within the abdomen or pelvis.  The stomach is normal. The small bowel loops have a normal  course and caliber. Normal appearance of the colon.  Review of the visualized osseous structures is significant for L5-S1 degenerative disc disease.  IMPRESSION: 1. Multiple stones are identified within the urinary bladder which may account for the patient's difficulty voiding. 2. Splenomegaly. 3. L5-S1 degenerative disc disease.   Electronically Signed   By: Signa Kell M.D.   On: 03/02/2014 19:41   Dg Chest 2 View  03/02/2014   CLINICAL DATA:  Fever.  EXAM: CHEST  2 VIEW  COMPARISON:  None  FINDINGS: Normal heart size. No pleural effusion or edema. Right lung is clear. There are patchy opacities within the left midlung and left base.  IMPRESSION: 1. Left mid lung and left base opacities suggests pneumonia.   Electronically Signed   By: Signa Kellaylor  Stroud M.D.   On: 03/02/2014 19:04    Microbiology: Recent Results (from the past 240 hour(s))  CULTURE, BLOOD (ROUTINE X 2)     Status: None   Collection Time    03/02/14  5:45 PM      Result Value Ref Range Status   Specimen Description BLOOD LEFT ARM   Final   Special Requests BOTTLES DRAWN AEROBIC AND ANAEROBIC 3ML   Final   Culture  Setup Time     Final   Value: 03/02/2014 22:08     Performed at [facility]   Culture     Final   Value:        BLOOD CULTURE RECEIVED NO GROWTH TO DATE CULTURE WILL BE HELD FOR 5 DAYS BEFORE ISSUING A FINAL NEGATIVE REPORT     Performed at [facility]   Report Status PENDING   Incomplete     Labs: Basic Metabolic Panel:  Recent Labs Lab 03/02/14 1723 03/03/14 0500  NA 134* 136*  K 3.7 3.5*  CL 95* 99  CO2 22 24  GLUCOSE 136* 110*  BUN 13 12  CREATININE 0.59 0.59  CALCIUM 9.9 9.4   Liver Function Tests: No results found for this basename: AST, ALT, ALKPHOS, BILITOT, PROT, ALBUMIN,  in the last 168 hours No results found for this basename: LIPASE, AMYLASE,  in the last 168 hours No results found for this basename: AMMONIA,  in the last 168 hours CBC:  Recent Labs Lab  03/02/14 1723 03/03/14 0500  WBC 20.6* 11.7*  NEUTROABS 18.5* 8.8*  HGB 14.7 12.5*  HCT 43.1 37.5*  MCV 87.6 89.1  PLT 166 149*   Cardiac Enzymes: No results found for this basename: CKTOTAL, CKMB, CKMBINDEX, TROPONINI,  in the last 168 hours BNP: BNP (last 3 results) No results found for this basename: PROBNP,  in the last 8760 hours CBG: No results found for this basename: GLUCAP,  in the last 168 hours     Signed:  Chaya JanHERNANDEZ ACOSTA,ESTELA  [facility] Pager: [phone] 03/03/2014, 1:35 PM

## 2014-03-07 ENCOUNTER — Encounter (HOSPITAL_COMMUNITY)
Admission: RE | Admit: 2014-03-07 | Payer: Medicare Other | Source: Ambulatory Visit | Attending: Neurology | Admitting: Neurology

## 2014-03-07 ENCOUNTER — Other Ambulatory Visit (HOSPITAL_COMMUNITY): Payer: Self-pay | Admitting: Neurology

## 2014-03-07 ENCOUNTER — Telehealth: Payer: Self-pay | Admitting: *Deleted

## 2014-03-07 NOTE — Telephone Encounter (Signed)
Samuel [name] with [facility] called.   Pt cancelled tysabri today due to infection and on abx per Dr. [name] (urology).  Has f/u with Dr. [name] on 03-17-14.  Will need tysabri orders when cleared.

## 2014-03-08 LAB — CULTURE, BLOOD (ROUTINE X 2): Culture: NO GROWTH

## 2014-03-09 LAB — CULTURE, BLOOD (ROUTINE X 2): Culture: NO GROWTH

## 2014-03-09 NOTE — Telephone Encounter (Signed)
I called patient. The patient recently has had urinary obstruction with a bladder infection, possible bladder or kidney stones. He also had a left-sided pneumonia, and he has been on IV and oral antibiotics. He is to contact our office once he has been felt to be fully treated for these infections. We will get the Tysabri started at that time.

## 2014-03-16 ENCOUNTER — Encounter (HOSPITAL_BASED_OUTPATIENT_CLINIC_OR_DEPARTMENT_OTHER): Payer: Self-pay | Admitting: *Deleted

## 2014-03-16 ENCOUNTER — Other Ambulatory Visit: Payer: Self-pay | Admitting: Urology

## 2014-03-16 NOTE — Progress Notes (Signed)
NPO AFTER MN. ARRIVE AT 5726. CURRENT LAB RESULTS IN CHART AND EPIC. WILL AM MEDS W/ SIPS OF WATER.

## 2014-03-18 NOTE — H&P (Signed)
History of Present Illness   Samuel [name] presents today to discuss some ongoing issues with urinary retention. I last saw him in April and have been looking after him for 15+ years with progressive neurogenic bladder. He developed inability to void and had a Foley catheter inserted. CT scan showed substantial amount of small bladder calculi. His Foley catheter has been draining reasonably well. He has passed a number of stones through the tube. The exact etiology of his urinary retention is not clear. His urine culture was negative. There was no definitive evidence of prostatitis. It did not appear to be medication related. It is possible that some of the stones were obstructing but, again, that is not entirely clear. He tolerated the catheter reasonably well. This has simplified his life at night where he had to use urinals constantly, voiding about 2 ounces at a time. His overall voiding status has worsened. He is here to discuss what we do from here. He has had the indwelling Foley catheter now for about 9 days.      Past Medical History Problems  1. History of Anxiety (Symptom) (799.2) 2. History of depression (V11.8) 3. History of esophageal reflux (V12.79) 4. History of hypercholesterolemia (V12.29) 5. History of hypertension (V12.59) 6. History of Hyperthyroidism (242.90) 7. History of Murmur (785.2)  Current Meds 1. Amitriptyline HCl - 100 MG Oral Tablet;  Therapy: 03Dec2014 to Recorded 2. Baclofen 20 MG Oral Tablet; 2 tablets 3 x daily;  Therapy: (Recorded:19Jul2012) to Recorded 3. Cholestyramine 4 GM Oral Packet;  Therapy: 07Jul2014 to Recorded 4. ClonazePAM 1 MG Oral Tablet;  Therapy: 08Jul2014 to Recorded 5. Dantrolene Sodium 100 MG Oral Capsule;  Therapy: 11Oct2014 to Recorded 6. Diazepam 10 MG Oral Tablet; TAKE 1 TABLET AT BEDTIME AS NEEDED;  Therapy: (Recorded:19Jan2012) to Recorded 7. Diphenoxylate-Atropine 2.5-0.025 MG Oral Tablet;  Therapy: 05May2014 to Recorded 8. Doxepin  HCl - 100 MG Oral Capsule; TAKE 1 CAPSULE AT BEDTIME;  Therapy: (Recorded:19Jan2012) to Recorded 9. Myrbetriq 50 MG Oral Tablet Extended Release 24 Hour;  Therapy: (Recorded:19Dec2014) to Recorded 10. PriLOSEC 20 MG Oral Capsule Delayed Release; TAKE 1 CAPSULE DAILY;   Therapy: (Recorded:19Jan2012) to Recorded 11. Pyridium 200 MG Oral Tablet; 1TIDPRN - TAKE ONE TABLET BY MOUTH THREE TIMES   DAILY AS NEEDED BURNING;   Therapy: 27Feb2015 to (Last Rx:27Feb2015)  Requested for: 01Mar2015 Ordered 12. Sertraline HCl TABS; 100mg  1 tablet twice daily;   Therapy: (Recorded:19Jul2012) to Recorded 13. Tamsulosin HCl - 0.4 MG Oral Capsule; Take 1 capsule twice daily  Requested for:   08Jan2015; Last Rx:08Jan2015 Ordered 14. Terazosin HCl TABS; 10 mg; TAKE 1 TABLET DAILY;   Therapy: (Recorded:19Jul2012) to Recorded 15. TiZANidine HCl - 4 MG Oral Tablet;   Therapy: 06Sep2011 to Recorded 16. TraMADol HCl - 50 MG Oral Tablet; TAKE 1 TABLET Every 6 hours PRN;   Therapy: 27Feb2015 to (Last Rx:27Feb2015) Ordered 17. Tysabri 300 MG/15ML Intravenous Concentrate; intravenous every 28  days;   Therapy: (Recorded:19Jul2012) to Recorded 18. Vitamin D 16109 UNIT CAPS; TAKE 1 CAPSULE WEEKLY;   Therapy: (Recorded:19Jan2012) to Recorded  Allergies Medication  1. No Known Drug Allergies  Family History Problems  1. Family history of Death of family member : Father   80/ massive stroke  Social History Problems  1. Denied: Alcohol Use 2. Caffeine Use   6 per day 3. Former smoker (V15.82) 4. Marital History - Single 5. Occupation:   disablility 6. Tobacco Use (V15.82)   1 pack per day for 6 years, quit  25 years ago  Review of Systems Genitourinary, constitutional, skin, eye, otolaryngeal, hematologic/lymphatic, cardiovascular, pulmonary, endocrine, musculoskeletal, gastrointestinal, neurological and psychiatric system(s) were reviewed and pertinent findings if present are noted.  Genitourinary:  urinary frequency, feelings of urinary urgency, nocturia, weak urinary stream, urinary stream starts and stops, incomplete emptying of bladder and penile pain, but no hematuria.  Gastrointestinal: constipation.  Constitutional: night sweats and feeling tired (fatigue).  Eyes: blurred vision and diplopia.  Respiratory: shortness of breath.  Endocrine: muscle weakness and feelings of weakness.  Musculoskeletal: back pain and joint stiffness.  Neurological: dizziness, limb weakness and difficulty walking.  Psychiatric: no depression.    Vitals Vital Signs [Data Includes: Last 1 Day]  Recorded: 10Jul2015 09:56AM  Blood Pressure: 112 / 79 Temperature: 98.3 F Heart Rate: 120  Physical Exam Constitutional: Well nourished and well developed . No acute distress.  ENT:. The ears and nose are normal in appearance.  Pulmonary: No respiratory distress and normal respiratory rhythm and effort.  Cardiovascular: Heart rate and rhythm are normal . No peripheral edema.  Abdomen: The abdomen is soft and nontender. No masses are palpated. No CVA tenderness. No hernias are palpable. No hepatosplenomegaly noted.  Genitourinary: Examination of the penis demonstrates an indwelling catheter.  The patient is parapalegic.    Assessment Assessed  1. Incomplete bladder emptying (788.21) 2. Neurogenic bladder (596.54) 3. Bladder calculus (594.1)  Discussion/Summary   Again, the etiology for Samuel [name]'s urinary retention is not entirely clear. What we do know is that he had has progressive voiding issues now for the last several years. He has a number of small bladder calculi, which certainly suggests that he has had increasing difficulty emptying his bladder to completion. I certainly would be hesitant to take his Foley catheter out at this point. It would feel that there would be a high likelihood he would develop some recurrent urinary retention and/or ongoing significant voiding issues. Ultimately, given his  progressive MS and overall decline in voiding function, I think at this point he may be much better off having placement of a suprapubic tube. I would propose outpatient procedure with removal of his urethral catheter with cystoscopy and removal of the bladder stones. Most of these should flush with irrigation but any larger stones could be blasted with a laser and then removed from his bladder. I think ultimately he will do better with a suprapubic tube and it will be more comfortable for him. That can then be changed on an every 4-6 week basis either through home health or potentially in our office, depending on what kind of coverage we can arrange for him.   Signatures Electronically signed by : Barron Alvineavid Joseline Mccampbell, M.D.; Mar 11 2014 12:14PM EST

## 2014-03-21 ENCOUNTER — Encounter (HOSPITAL_BASED_OUTPATIENT_CLINIC_OR_DEPARTMENT_OTHER)
Admission: RE | Disposition: A | Discharge status: Home / Self Care | Payer: Self-pay | Source: Ambulatory Visit | Attending: Urology

## 2014-03-21 ENCOUNTER — Encounter (HOSPITAL_BASED_OUTPATIENT_CLINIC_OR_DEPARTMENT_OTHER): Payer: Self-pay | Admitting: Anesthesiology

## 2014-03-21 ENCOUNTER — Ambulatory Visit (HOSPITAL_BASED_OUTPATIENT_CLINIC_OR_DEPARTMENT_OTHER)
Admission: RE | Admit: 2014-03-21 | Discharge: 2014-03-21 | Disposition: A | Discharge status: Home / Self Care | Payer: Medicare Other | Source: Ambulatory Visit | Attending: Urology | Admitting: Urology

## 2014-03-21 ENCOUNTER — Ambulatory Visit (HOSPITAL_BASED_OUTPATIENT_CLINIC_OR_DEPARTMENT_OTHER): Payer: Medicare Other | Admitting: Anesthesiology

## 2014-03-21 ENCOUNTER — Encounter (HOSPITAL_BASED_OUTPATIENT_CLINIC_OR_DEPARTMENT_OTHER): Payer: Medicare Other | Admitting: Anesthesiology

## 2014-03-21 DIAGNOSIS — N319 Neuromuscular dysfunction of bladder, unspecified: Secondary | ICD-10-CM | POA: Diagnosis not present

## 2014-03-21 DIAGNOSIS — N210 Calculus in bladder: Secondary | ICD-10-CM | POA: Diagnosis not present

## 2014-03-21 DIAGNOSIS — F411 Generalized anxiety disorder: Secondary | ICD-10-CM | POA: Diagnosis not present

## 2014-03-21 DIAGNOSIS — Z79899 Other long term (current) drug therapy: Secondary | ICD-10-CM | POA: Insufficient documentation

## 2014-03-21 DIAGNOSIS — K219 Gastro-esophageal reflux disease without esophagitis: Secondary | ICD-10-CM | POA: Diagnosis not present

## 2014-03-21 DIAGNOSIS — R339 Retention of urine, unspecified: Secondary | ICD-10-CM | POA: Insufficient documentation

## 2014-03-21 DIAGNOSIS — E039 Hypothyroidism, unspecified: Secondary | ICD-10-CM | POA: Diagnosis not present

## 2014-03-21 DIAGNOSIS — F3289 Other specified depressive episodes: Secondary | ICD-10-CM | POA: Insufficient documentation

## 2014-03-21 DIAGNOSIS — Z87891 Personal history of nicotine dependence: Secondary | ICD-10-CM | POA: Diagnosis not present

## 2014-03-21 DIAGNOSIS — E7800 Pure hypercholesterolemia, unspecified: Secondary | ICD-10-CM | POA: Diagnosis not present

## 2014-03-21 DIAGNOSIS — F329 Major depressive disorder, single episode, unspecified: Secondary | ICD-10-CM | POA: Insufficient documentation

## 2014-03-21 DIAGNOSIS — I10 Essential (primary) hypertension: Secondary | ICD-10-CM | POA: Diagnosis not present

## 2014-03-21 HISTORY — PX: INSERTION OF SUPRAPUBIC CATHETER: SHX5870

## 2014-03-21 HISTORY — DX: Candidiasis, unspecified: B37.9

## 2014-03-21 HISTORY — DX: Presence of urogenital implants: Z96.0

## 2014-03-21 HISTORY — DX: Neurogenic bowel, not elsewhere classified: K59.2

## 2014-03-21 HISTORY — DX: Hyperlipidemia, unspecified: E78.5

## 2014-03-21 HISTORY — DX: Pneumonia, unspecified organism: J18.9

## 2014-03-21 HISTORY — DX: Neuromuscular dysfunction of bladder, unspecified: N31.9

## 2014-03-21 HISTORY — DX: Calculus in bladder: N21.0

## 2014-03-21 HISTORY — DX: Presence of other specified devices: Z97.8

## 2014-03-21 HISTORY — DX: Retention of urine, unspecified: R33.9

## 2014-03-21 HISTORY — PX: CYSTOSCOPY: SHX5120

## 2014-03-21 HISTORY — DX: Chronic fatigue, unspecified: R53.82

## 2014-03-21 LAB — POCT I-STAT, CHEM 8
BUN: 13 mg/dL (ref 6–23)
Calcium, Ion: 1.28 mmol/L — ABNORMAL HIGH (ref 1.12–1.23)
Chloride: 96 mEq/L (ref 96–112)
Creatinine, Ser: 0.8 mg/dL (ref 0.50–1.35)
Glucose, Bld: 115 mg/dL — ABNORMAL HIGH (ref 70–99)
HCT: 47 % (ref 39.0–52.0)
Hemoglobin: 16 g/dL (ref 13.0–17.0)
Potassium: 3.9 mEq/L (ref 3.7–5.3)
Sodium: 140 mEq/L (ref 137–147)
TCO2: 29 mmol/L (ref 0–100)

## 2014-03-21 SURGERY — INSERTION, SUPRAPUBIC CATHETER
Anesthesia: General | Site: Bladder

## 2014-03-21 MED ORDER — CEFAZOLIN SODIUM-DEXTROSE 2-3 GM-% IV SOLR
2.0000 g | Freq: Once | INTRAVENOUS | Status: AC
  Administered 2014-03-21: 2 g via INTRAVENOUS
  Filled 2014-03-21: qty 50

## 2014-03-21 MED ORDER — MIDAZOLAM HCL 2 MG/2ML IJ SOLN
INTRAMUSCULAR | Status: AC
  Filled 2014-03-21: qty 2

## 2014-03-21 MED ORDER — HYDROCODONE-ACETAMINOPHEN 5-325 MG PO TABS
ORAL_TABLET | ORAL | Status: AC
  Filled 2014-03-21: qty 1

## 2014-03-21 MED ORDER — ONDANSETRON HCL 4 MG/2ML IJ SOLN
INTRAMUSCULAR | Status: DC | PRN
  Administered 2014-03-21: 4 mg via INTRAVENOUS

## 2014-03-21 MED ORDER — HYDROCODONE-ACETAMINOPHEN 5-325 MG PO TABS: 1.0000 | ORAL_TABLET | Freq: Four times a day (QID) | ORAL | Status: DC | PRN

## 2014-03-21 MED ORDER — MIDAZOLAM HCL 5 MG/5ML IJ SOLN
INTRAMUSCULAR | Status: DC | PRN
  Administered 2014-03-21: 1 mg via INTRAVENOUS

## 2014-03-21 MED ORDER — FENTANYL CITRATE 0.05 MG/ML IJ SOLN
INTRAMUSCULAR | Status: AC
  Filled 2014-03-21: qty 4

## 2014-03-21 MED ORDER — HYDROCODONE-ACETAMINOPHEN 5-325 MG PO TABS
1.0000 | ORAL_TABLET | Freq: Four times a day (QID) | ORAL | Status: AC | PRN
  Administered 2014-03-21: 1 via ORAL
  Filled 2014-03-21: qty 2

## 2014-03-21 MED ORDER — LACTATED RINGERS IV SOLN
INTRAVENOUS | Status: DC | PRN
  Administered 2014-03-21: 10:00:00 via INTRAVENOUS

## 2014-03-21 MED ORDER — BUPIVACAINE HCL (PF) 0.25 % IJ SOLN
INTRAMUSCULAR | Status: DC | PRN
  Administered 2014-03-21: 10 mL

## 2014-03-21 MED ORDER — PHENYLEPHRINE HCL 10 MG/ML IJ SOLN
INTRAMUSCULAR | Status: DC | PRN
  Administered 2014-03-21 (×2): 100 ug via INTRAVENOUS

## 2014-03-21 MED ORDER — FENTANYL CITRATE 0.05 MG/ML IJ SOLN
25.0000 ug | INTRAMUSCULAR | Status: DC | PRN
  Filled 2014-03-21: qty 1

## 2014-03-21 MED ORDER — STERILE WATER FOR IRRIGATION IR SOLN
Status: DC | PRN
  Administered 2014-03-21: 3000 mL

## 2014-03-21 MED ORDER — FENTANYL CITRATE 0.05 MG/ML IJ SOLN
INTRAMUSCULAR | Status: DC | PRN
  Administered 2014-03-21: 25 ug via INTRAVENOUS
  Administered 2014-03-21: 50 ug via INTRAVENOUS
  Administered 2014-03-21: 25 ug via INTRAVENOUS

## 2014-03-21 MED ORDER — LIDOCAINE HCL (CARDIAC) 20 MG/ML IV SOLN
INTRAVENOUS | Status: DC | PRN
  Administered 2014-03-21: 50 mg via INTRAVENOUS

## 2014-03-21 MED ORDER — DEXAMETHASONE SODIUM PHOSPHATE 4 MG/ML IJ SOLN
INTRAMUSCULAR | Status: DC | PRN
  Administered 2014-03-21 (×2): 5 mg via INTRAVENOUS

## 2014-03-21 MED ORDER — PROPOFOL 10 MG/ML IV BOLUS
INTRAVENOUS | Status: DC | PRN
  Administered 2014-03-21: 120 mg via INTRAVENOUS

## 2014-03-21 MED ORDER — PROMETHAZINE HCL 25 MG/ML IJ SOLN
6.2500 mg | INTRAMUSCULAR | Status: DC | PRN
  Filled 2014-03-21: qty 1

## 2014-03-21 MED ORDER — GENTAMICIN SULFATE 40 MG/ML IJ SOLN
5.0000 mg/kg | INTRAVENOUS | Status: AC
  Administered 2014-03-21: 400 mg via INTRAVENOUS
  Filled 2014-03-21: qty 9.75

## 2014-03-21 MED ORDER — LACTATED RINGERS IV SOLN
INTRAVENOUS | Status: DC
  Administered 2014-03-21: 10:00:00 via INTRAVENOUS
  Filled 2014-03-21: qty 1000

## 2014-03-21 SURGICAL SUPPLY — 38 items
BAG DRAIN URO-CYSTO SKYTR STRL (DRAIN) ×3 IMPLANT
BAG DRN UROCATH (DRAIN) ×2
BAG URINE DRAINAGE (UROLOGICAL SUPPLIES) ×3 IMPLANT
BAG URINE LEG 500ML (DRAIN) ×3 IMPLANT
BLADE SURG 15 STRL LF DISP TIS (BLADE) ×2 IMPLANT
BLADE SURG 15 STRL SS (BLADE) ×3
CANISTER SUCT LVC 12 LTR MEDI- (MISCELLANEOUS) IMPLANT
CATH FOLEY 2WAY SLVR  5CC 20FR (CATHETERS) ×1
CATH FOLEY 2WAY SLVR 5CC 20FR (CATHETERS) ×1 IMPLANT
CATH ROBINSON RED A/P 14FR (CATHETERS) IMPLANT
CATH ROBINSON RED A/P 16FR (CATHETERS) IMPLANT
CLOTH BEACON ORANGE TIMEOUT ST (SAFETY) ×3 IMPLANT
DRAPE CAMERA CLOSED 9X96 (DRAPES) ×3 IMPLANT
ELECT REM PT RETURN 9FT ADLT (ELECTROSURGICAL)
ELECTRODE REM PT RTRN 9FT ADLT (ELECTROSURGICAL) ×1 IMPLANT
GLOVE BIO SURGEON STRL SZ7.5 (GLOVE) ×3 IMPLANT
GOWN STRL REIN XL XLG (GOWN DISPOSABLE) ×1 IMPLANT
GOWN STRL REUS W/ TWL LRG LVL3 (GOWN DISPOSABLE) ×1 IMPLANT
GOWN STRL REUS W/ TWL XL LVL3 (GOWN DISPOSABLE) ×1 IMPLANT
GOWN STRL REUS W/TWL LRG LVL3 (GOWN DISPOSABLE) ×3
GOWN STRL REUS W/TWL XL LVL3 (GOWN DISPOSABLE) ×5 IMPLANT
KIT SUPRAPUBIC CATH (MISCELLANEOUS) ×1 IMPLANT
NDL SAFETY ECLIPSE 18X1.5 (NEEDLE) IMPLANT
NEEDLE HYPO 18GX1.5 SHARP (NEEDLE)
NEEDLE HYPO 22GX1.5 SAFETY (NEEDLE) IMPLANT
NS IRRIG 500ML POUR BTL (IV SOLUTION) IMPLANT
PACK CYSTO (CUSTOM PROCEDURE TRAY) ×3 IMPLANT
PACK CYSTOSCOPY (CUSTOM PROCEDURE TRAY) ×3 IMPLANT
PENCIL BUTTON HOLSTER BLD 10FT (ELECTRODE) IMPLANT
PLUG CATH AND CAP STER (CATHETERS) IMPLANT
SPONGE GAUZE 4X4 12PLY STER LF (GAUZE/BANDAGES/DRESSINGS) ×2 IMPLANT
SUT ETHILON 3 0 FSL (SUTURE) ×3 IMPLANT
SYR 20CC LL (SYRINGE) ×2 IMPLANT
SYR BULB IRRIGATION 50ML (SYRINGE) IMPLANT
TAPE PAPER 3X10 WHT MICROPORE (GAUZE/BANDAGES/DRESSINGS) ×2 IMPLANT
TOWEL OR 17X24 6PK STRL BLUE (TOWEL DISPOSABLE) ×3 IMPLANT
WATER STERILE IRR 3000ML UROMA (IV SOLUTION) ×3 IMPLANT
WATER STERILE IRR 500ML POUR (IV SOLUTION) ×3 IMPLANT

## 2014-03-21 NOTE — Interval H&P Note (Signed)
History and Physical Interval Note:  03/21/2014 9:53 AM  Samuel [name]  has presented today for surgery, with the diagnosis of URINARY RETENTION, BLADDER CALCULI, NEUROGENIC BLADDER  The various methods of treatment have been discussed with the patient and family. After consideration of risks, benefits and other options for treatment, the patient has consented to  Procedure(s) with comments: INSERTION OF SUPRAPUBIC CATHETER (N/A) -  [id number]  [phone] [employer] MEDICARE-[id number] CYSTOSCOPY TREATMAENT OF BLADDER CALCULI (N/A) HOLMIUM LASER APPLICATION (N/A) as a surgical intervention .  The patient's history has been reviewed, patient examined, no change in status, stable for surgery.  I have reviewed the patient's chart and labs.  Questions were answered to the patient's satisfaction.     Lakina Mcintire S

## 2014-03-21 NOTE — Anesthesia Preprocedure Evaluation (Addendum)
Anesthesia Evaluation  Patient identified by MRN, date of birth, ID band Patient awake    Reviewed: Allergy & Precautions, H&P , NPO status , Patient's Chart, lab work & pertinent test results  Airway Mallampati: II TM Distance: >3 FB Neck ROM: Limited    Dental no notable dental hx.    Pulmonary neg pulmonary ROS, former smoker,  breath sounds clear to auscultation  Pulmonary exam normal       Cardiovascular hypertension, Pt. on medications Rhythm:Regular Rate:Normal     Neuro/Psych Multiple sclerosis    Spastic quadriparesis   negative neurological ROS  negative psych ROS   GI/Hepatic Neg liver ROS, GERD-  Medicated,  Endo/Other  Hypothyroidism   Renal/GU negative Renal ROS  negative genitourinary   Musculoskeletal negative musculoskeletal ROS (+)   Abdominal   Peds negative pediatric ROS (+)  Hematology negative hematology ROS (+)   Anesthesia Other Findings   Reproductive/Obstetrics negative OB ROS                          Anesthesia Physical Anesthesia Plan  ASA: III  Anesthesia Plan: General   Post-op Pain Management:    Induction: Intravenous  Airway Management Planned: LMA  Additional Equipment:   Intra-op Plan:   Post-operative Plan:   Informed Consent: I have reviewed the patients History and Physical, chart, labs and discussed the procedure including the risks, benefits and alternatives for the proposed anesthesia with the patient or authorized representative who has indicated his/her understanding and acceptance.   Dental advisory given  Plan Discussed with: CRNA and Surgeon  Anesthesia Plan Comments:         Anesthesia Quick Evaluation

## 2014-03-21 NOTE — Anesthesia Procedure Notes (Signed)
Procedure Name: LMA Insertion Date/Time: 03/21/2014 10:07 AM Performed by: Norva Pavlov Pre-anesthesia Checklist: Patient identified, Emergency Drugs available, Suction available and Patient being monitored Patient Re-evaluated:Patient Re-evaluated prior to inductionOxygen Delivery Method: Circle System Utilized Preoxygenation: Pre-oxygenation with 100% oxygen Intubation Type: IV induction Ventilation: Mask ventilation without difficulty LMA: LMA inserted LMA Size: 4.0 Number of attempts: 1 Airway Equipment and Method: bite block Placement Confirmation: positive ETCO2 Tube secured with: Tape Dental Injury: Teeth and Oropharynx as per pre-operative assessment

## 2014-03-21 NOTE — Anesthesia Postprocedure Evaluation (Signed)
  Anesthesia Post-op Note  Patient: Samuel [name]  Procedure(s) Performed: Procedure(s) (LRB): INSERTION OF SUPRAPUBIC CATHETER (N/A) CYSTOSCOPY TREATMENT OF BLADDER CALCULI (N/A)  Patient Location: PACU  Anesthesia Type: General  Level of Consciousness: awake and alert   Airway and Oxygen Therapy: Patient Spontanous Breathing  Post-op Pain: mild  Post-op Assessment: Post-op Vital signs reviewed, Patient's Cardiovascular Status Stable, Respiratory Function Stable, Patent Airway and No signs of Nausea or vomiting  Last Vitals:  Filed Vitals:   03/21/14 1130  BP: 94/69  Pulse: 95  Temp:   Resp: 7    Post-op Vital Signs: stable   Complications: No apparent anesthesia complications

## 2014-03-21 NOTE — Op Note (Signed)
Preoperative diagnosis: Neurogenic bladder, acute urinary retention, bladder calculi Postoperative diagnosis: Same  Procedure: Cystoscopy, removal of bladder stones, insertion of suprapubic tube 20 French   Surgeon: [name] M.D.  Anesthesia: Gen.  Indications: Cierra has been a patient of mine for approximately 20 years with progressive voiding dysfunction/neurogenic bladder. He has a long-standing history of multiple sclerosis. He recently developed urinary retention. Recent CT scan showed multiple small bladder calculi. We talked about some longer-term strategies and thought he would probably be better off having a suprapubic tube placed. He is here now for placement of suprapubic tube with removal of bladder calculi.     Technique and findings: Patient was brought the operating room where he has successful induction of general anesthesia. She was lithotomy position prepped and draped usual manner. Appropriate surgical timeout was performed. The patient received perioperative antibiotics. Cystoscopy revealed an unremarkable anterior and prostatic urethra. No significant visual obstruction noted. The bladder showed generalized urothelium and edema consistent with some chronic cystitis and Foley catheter use. Within the bladder there was a tremendous amount of sand-like debris and also a number of small bladder stones nontender than about 5-6 mm. Irrigation was used to remove all of the stones.   The cystoscope was removed and a Lowsley retractor was used to tent the anterior wall the bladder. An incision was made overlying the Lowsley retractor and a 20 French suprapubic tube was then placed. Positioning was confirmed with cystoscopy. The tube was secured to the anterior abdominal wall. No obvious complications occurred he was brought to recovery room in stable condition.

## 2014-03-21 NOTE — Transfer of Care (Addendum)
Immediate Anesthesia Transfer of Care Note  Patient: Samuel [name]  Procedure(s) Performed: Procedure(s) (LRB): INSERTION OF SUPRAPUBIC CATHETER (N/A) CYSTOSCOPY TREATMENT OF BLADDER CALCULI (N/A)  Patient Location: PACU  Anesthesia Type: General  Level of Consciousness:sleepy, oral airway remains  Airway & Oxygen Therapy: Patient Spontanous Breathing and Patient connected to face mask oxygen  Post-op Assessment: Report given to PACU RN and Post -op Vital signs reviewed and stable  Post vital signs: Reviewed and stable  Complications: No apparent anesthesia complications

## 2014-03-21 NOTE — Discharge Instructions (Signed)
Cystoscopy patient instructions  Following a cystoscopy, a catheter (a flexible rubber tube) is sometimes left in place to empty the bladder. This may cause some discomfort or a feeling that you need to urinate. Your doctor determines the period of time that the catheter will be left in place. You may have bloody urine for two to three days (Call your doctor if the amount of bleeding increases or does not subside).  You may pass blood clots in your urine, especially if you had a biopsy. It is not unusual to pass small blood clots and have some bloody urine a couple of weeks after your cystoscopy. Again, call your doctor if the bleeding does not subside. You may have: Dysuria (painful urination) Frequency (urinating often) Urgency (strong desire to urinate)  These symptoms are common especially if medicine is instilled into the bladder or a ureteral stent is placed. Avoiding alcohol and caffeine, such as coffee, tea, and chocolate, may help relieve these symptoms. Drink plenty of water, unless otherwise instructed. Your doctor may also prescribe an antibiotic or other medicine to reduce these symptoms.  Cystoscopy results are available soon after the procedure; biopsy results usually take two to four days. Your doctor will discuss the results of your exam with you. Before you go home, you will be given specific instructions for follow-up care. Special Instructions:  1 If you are going home with a catheter in place do not take a tub bath until removed by your doctor.  2 You may resume your normal activities.  3 Do not drive or operate machinery if you are taking narcotic pain medicine.  4 Be sure to keep all follow-up appointments with your doctor.   5 Call Your Doctor If: The catheter is not draining  You have severe pain  You are unable to urinate  You have a fever over 101  You have severe bleeding           Post Anesthesia Home Care Instructions  Activity: Get plenty of rest for  the remainder of the day. A responsible adult should stay with you for 24 hours following the procedure.  For the next 24 hours, DO NOT: -Drive a car -Advertising copywriterperate machinery -Drink alcoholic beverages -Take any medication unless instructed by your physician -Make any legal decisions or sign important papers.  Meals: Start with liquid foods such as gelatin or soup. Progress to regular foods as tolerated. Avoid greasy, spicy, heavy foods. If nausea and/or vomiting occur, drink only clear liquids until the nausea and/or vomiting subsides. Call your physician if vomiting continues.  Special Instructions/Symptoms: Your throat may feel dry or sore from the anesthesia or the breathing tube placed in your throat during surgery. If this causes discomfort, gargle with warm salt water. The discomfort should disappear within 24 hours.    Suprapubic Catheter Replacement Care After Refer to this sheet in the next few weeks. These instructions provide you with information on caring for yourself after changing your catheter. Your caregiver may also give you more specific instructions. Call your caregiver if you have any problems or questions after you change your catheter. HOME CARE INSTRUCTIONS   Take all medicines prescribed by your caregiver. Follow the directions carefully.  Drink 8 glasses of water every day. This produces good urine flow.  Check the skin around your catheter a few times every day. Watch for redness and swelling. Look for any fluids coming out of the opening.  Do not use powder or cream around the catheter opening.  Do not take tub baths or use pools or hot tubs.  Keep all follow-up appointments. SEEK MEDICAL CARE IF:   You leak urine.  Your skin around the catheter becomes red or sore.  Your urine flow slows down.  Your urine gets cloudy or smelly. SEEK IMMEDIATE MEDICAL CARE IF:   You have chills, nausea, or back pain.  You have trouble changing your catheter.  Your  catheter comes out.  You have blood in your urine.  You have no urine flow for 1 hour.  You have a fever. Document Released: 05/07/2011 Document Revised: 11/11/2011 Document Reviewed: 05/07/2011 [facility] Patient Information 2015 [place], [place]. This information is not intended to replace advice given to you by your health care provider. Make sure you discuss any questions you have with your health care provider.

## 2014-03-21 NOTE — Addendum Note (Signed)
Addendum created 03/21/14 1225 by Norva Pavlov, CRNA   Modules edited: Anesthesia Flowsheet

## 2014-03-22 ENCOUNTER — Encounter (HOSPITAL_BASED_OUTPATIENT_CLINIC_OR_DEPARTMENT_OTHER): Payer: Self-pay | Admitting: Urology

## 2014-03-23 ENCOUNTER — Other Ambulatory Visit: Payer: Self-pay | Admitting: Neurology

## 2014-03-24 NOTE — Telephone Encounter (Signed)
Rx signed and faxed.

## 2014-03-31 ENCOUNTER — Telehealth: Payer: Self-pay | Admitting: *Deleted

## 2014-03-31 NOTE — Telephone Encounter (Signed)
Patient had  bladder surgery done and stones where removed. He now has cath and will follow up with hs urologist on the 20th. He is concerned about continuing to wait for his next Tysabri infusion. Please call to discuss.

## 2014-03-31 NOTE — Telephone Encounter (Signed)
I called patient. He currently has a catheter in place, but the bladder stones been removed, and he is waiting for another revisit on August 20 from the urology doctor. It has been 7 weeks since his last infusion of Tysabri. He has not run any recent fevers. I see no reason to wait on the Tysabri. I will have this reinstituted.

## 2014-04-01 ENCOUNTER — Other Ambulatory Visit: Payer: Self-pay | Admitting: Neurology

## 2014-04-01 DIAGNOSIS — G35 Multiple sclerosis: Secondary | ICD-10-CM

## 2014-04-01 NOTE — Telephone Encounter (Signed)
I called [facility] but they need tysabri orders.

## 2014-04-01 NOTE — Telephone Encounter (Signed)
I will put orders in for the Tysabri.

## 2014-04-04 ENCOUNTER — Telehealth: Payer: Self-pay | Admitting: Neurology

## 2014-04-04 ENCOUNTER — Other Ambulatory Visit: Payer: Self-pay | Admitting: Neurology

## 2014-04-04 NOTE — Telephone Encounter (Signed)
Patient calling to state that no one has called him to schedule his Tysabri infusion yet, please return call to patient and advise.

## 2014-04-04 NOTE — Telephone Encounter (Signed)
(  [name]) from the touch program calling and is wanting to know the status of the Tysabri for the patient, please call her at [phone], press ext. [phone]

## 2014-04-04 NOTE — Telephone Encounter (Signed)
Currently dose per phone note on 06/11/2013

## 2014-04-05 ENCOUNTER — Encounter (HOSPITAL_COMMUNITY)
Admission: RE | Admit: 2014-04-05 | Discharge: 2014-04-05 | Disposition: A | Discharge status: Home / Self Care | Payer: Medicare Other | Source: Ambulatory Visit | Attending: Neurology | Admitting: Neurology

## 2014-04-05 ENCOUNTER — Encounter (HOSPITAL_COMMUNITY): Payer: Self-pay

## 2014-04-05 VITALS — BP 101/70 | HR 84 | Temp 97.6°F | Resp 18

## 2014-04-05 DIAGNOSIS — G35 Multiple sclerosis: Secondary | ICD-10-CM | POA: Diagnosis present

## 2014-04-05 MED ORDER — LORATADINE 10 MG PO TABS
10.0000 mg | ORAL_TABLET | ORAL | Status: DC
  Administered 2014-04-05: 10 mg via ORAL
  Filled 2014-04-05: qty 1

## 2014-04-05 MED ORDER — SODIUM CHLORIDE 0.9 % IV SOLN
INTRAVENOUS | Status: DC
  Administered 2014-04-05: 11:00:00 via INTRAVENOUS

## 2014-04-05 MED ORDER — ACETAMINOPHEN 325 MG PO TABS
650.0000 mg | ORAL_TABLET | ORAL | Status: DC
  Administered 2014-04-05: 650 mg via ORAL
  Filled 2014-04-05: qty 2

## 2014-04-05 MED ORDER — SODIUM CHLORIDE 0.9 % IV SOLN
300.0000 mg | INTRAVENOUS | Status: DC
  Administered 2014-04-05: 300 mg via INTRAVENOUS
  Filled 2014-04-05: qty 15

## 2014-04-05 NOTE — Telephone Encounter (Signed)
Pt scheduled for today 04-05-14.

## 2014-04-05 NOTE — Discharge Instructions (Signed)

## 2014-04-05 NOTE — Progress Notes (Signed)
Post-Tysabri Infusion, pt. Stayed 35 minutes of infusion, pt. To follow up with doctor with any problems.

## 2014-04-08 ENCOUNTER — Encounter (HOSPITAL_COMMUNITY): Payer: Medicare Other

## 2014-04-12 ENCOUNTER — Inpatient Hospital Stay (HOSPITAL_COMMUNITY): Admission: RE | Admit: 2014-04-12 | Payer: Medicare Other | Source: Ambulatory Visit

## 2014-04-27 ENCOUNTER — Other Ambulatory Visit: Payer: Self-pay | Admitting: Neurology

## 2014-05-03 ENCOUNTER — Encounter (HOSPITAL_COMMUNITY)
Admission: RE | Admit: 2014-05-03 | Discharge: 2014-05-03 | Disposition: A | Discharge status: Home / Self Care | Payer: Medicare Other | Source: Ambulatory Visit | Attending: Neurology | Admitting: Neurology

## 2014-05-03 ENCOUNTER — Encounter (HOSPITAL_COMMUNITY): Payer: Self-pay

## 2014-05-03 VITALS — BP 112/66 | HR 84 | Temp 98.5°F | Resp 18

## 2014-05-03 DIAGNOSIS — G35 Multiple sclerosis: Secondary | ICD-10-CM | POA: Insufficient documentation

## 2014-05-03 MED ORDER — SODIUM CHLORIDE 0.9 % IV SOLN
INTRAVENOUS | Status: DC
  Administered 2014-05-03: 250 mL via INTRAVENOUS

## 2014-05-03 MED ORDER — SODIUM CHLORIDE 0.9 % IV SOLN
300.0000 mg | INTRAVENOUS | Status: DC
  Administered 2014-05-03: 300 mg via INTRAVENOUS
  Filled 2014-05-03: qty 15

## 2014-05-03 MED ORDER — LORATADINE 10 MG PO TABS
10.0000 mg | ORAL_TABLET | ORAL | Status: DC
  Administered 2014-05-03: 10 mg via ORAL
  Filled 2014-05-03: qty 1

## 2014-05-03 MED ORDER — ACETAMINOPHEN 325 MG PO TABS
650.0000 mg | ORAL_TABLET | ORAL | Status: DC
  Administered 2014-05-03: 650 mg via ORAL
  Filled 2014-05-03: qty 2

## 2014-05-03 NOTE — Discharge Instructions (Signed)
° ° ° °ATTENTION: ° °If you are going to be 15 or more minutes late for your appointment, please call [phone] to make other arrangements for your treatment. ° °If you arrive early for your schedule appointment, you may have to wait until your scheduled time. ° °Natalizumab injection °What is this medicine? °NATALIZUMAB (na ta LIZ you mab) is used to treat relapsing multiple sclerosis. This drug is not a cure. It is also used to treat Crohn's disease. °This medicine may be used for other purposes; ask your health care provider or pharmacist if you have questions. °COMMON BRAND NAME(S): Tysabri °What should I tell my health care provider before I take this medicine? °They need to know if you have any of these conditions: °-immune system problems °-progressive multifocal leukoencephalopathy (PML) °-an unusual or allergic reaction to natalizumab, other medicines, foods, dyes, or preservatives °-pregnant or trying to get pregnant °-breast-feeding °How should I use this medicine? °This medicine is for infusion into a vein. It is given by a health care professional in a hospital or clinic setting. °A special MedGuide will be given to you by the pharmacist with each prescription and refill. Be sure to read this information carefully each time. °Talk to your pediatrician regarding the use of this medicine in children. This medicine is not approved for use in children. °Overdosage: If you think you have taken too much of this medicine contact a poison control center or emergency room at once. °NOTE: This medicine is only for you. Do not share this medicine with others. °What if I miss a dose? °It is important not to miss your dose. Call your doctor or health care professional if you are unable to keep an appointment. °What may interact with this medicine? °-azathioprine °-cyclosporine °-interferon °-6-mercaptopurine °-methotrexate °-steroid medicines like prednisone or cortisone °-TNF-alpha inhibitors like adalimumab,  etanercept, and infliximab °-vaccines °This list may not describe all possible interactions. Give your health care provider a list of all the medicines, herbs, non-prescription drugs, or dietary supplements you use. Also tell them if you smoke, drink alcohol, or use illegal drugs. Some items may interact with your medicine. °What should I watch for while using this medicine? °Your condition will be monitored carefully while you are receiving this medicine. Visit your doctor for regular check ups. Tell your doctor or healthcare professional if your symptoms do not start to get better or if they get worse. °Stay away from people who are sick. Call your doctor or health care professional for advice if you get a fever, chills or sore throat, or other symptoms of a cold or flu. Do not treat yourself. °In some patients, this medicine may cause a serious brain infection that may cause death. If you have any problems seeing, thinking, speaking, walking, or standing, tell your doctor right away. If you cannot reach your doctor, get urgent medical care. °What side effects may I notice from receiving this medicine? °Side effects that you should report to your doctor or health care professional as soon as possible: °-allergic reactions like skin rash, itching or hives, swelling of the face, lips, or tongue °-breathing problems °-changes in vision °-chest pain °-dark urine °-depression, feelings of sadness °-dizziness °-general ill feeling or flu-like symptoms °-irregular, missed, or painful menstrual periods °-light-colored stools °-loss of appetite, nausea °-muscle weakness °-problems with balance, talking, or walking °-right upper belly pain °-unusually weak or tired °-yellowing of the eyes or skin °Side effects that usually do not require medical attention (report to your   doctor or health care professional if they continue or are bothersome): °-aches, pains °-headache °-stomach upset °-tiredness °This list may not describe  all possible side effects. Call your doctor for medical advice about side effects. You may report side effects to FDA at 1-800-FDA-1088. °Where should I keep my medicine? °This drug is given in a hospital or clinic and will not be stored at home. °NOTE: This sheet is a summary. It may not cover all possible information. If you have questions about this medicine, talk to your doctor, pharmacist, or health care provider. °© 2015, [employer]. (2008-10-08 13:33:21) ° °

## 2014-05-06 ENCOUNTER — Encounter (HOSPITAL_COMMUNITY): Payer: Medicare Other

## 2014-05-13 ENCOUNTER — Other Ambulatory Visit: Payer: Self-pay | Admitting: Neurology

## 2014-05-13 ENCOUNTER — Ambulatory Visit (INDEPENDENT_AMBULATORY_CARE_PROVIDER_SITE_OTHER): Payer: Medicare Other | Admitting: Neurology

## 2014-05-13 ENCOUNTER — Encounter: Payer: Self-pay | Admitting: Neurology

## 2014-05-13 DIAGNOSIS — G8250 Quadriplegia, unspecified: Secondary | ICD-10-CM

## 2014-05-13 DIAGNOSIS — G35 Multiple sclerosis: Secondary | ICD-10-CM

## 2014-05-13 DIAGNOSIS — Z5181 Encounter for therapeutic drug level monitoring: Secondary | ICD-10-CM

## 2014-05-13 MED ORDER — TRAMADOL HCL 50 MG PO TABS: 50.0000 mg | ORAL_TABLET | Freq: Four times a day (QID) | ORAL | Status: DC | PRN

## 2014-05-13 MED ORDER — HYDROCODONE-ACETAMINOPHEN 5-325 MG PO TABS: 1.0000 | ORAL_TABLET | Freq: Four times a day (QID) | ORAL | Status: DC | PRN

## 2014-05-13 NOTE — Patient Instructions (Signed)

## 2014-05-13 NOTE — Progress Notes (Signed)
Reason for visit: Multiple sclerosis  Samuel [name] is an 53 y.o. male  History of present illness:  Samuel [name] is a 53 year old right-handed white male with a history of multiple sclerosis with a spastic quadriparesis. The patient has had issues with bladder stones, and he has had a catheter placed at this point. He is followed through urology. He has gotten back on Tysabri. He believes that the Tysabri does help his cognitive issues, and it helps some of his body symptoms. The patient feels poorly if he misses a dose of the medication. The patient has had some episodes of profuse sweating in the lower half of his body, feeling cold on the upper half. He continues to have significant spasticity, and he is gradually losing the use of his arms, right greater than left. The patient has a flexion contracture at the elbow on the right. He has noted for some time now that it hurts to sit, and he has to take hydrocodone or Ultram if he is going to be sitting in his wheelchair for more than an hour and a half. The patient continues to have issues with bowel function. He denies any significant changes in his vision. He returns to this office for an evaluation.  Past Medical History  Diagnosis Date  . Hypertension   . GERD (gastroesophageal reflux disease)   . Hypothyroidism   . Vitamin D deficiency   . Chronic back pain   . Gait disorder   . 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 Multiple sclerosis NEUROLOGIST-- [name]    dx 1993, JC virus negative 2/14---  CURRENTLY RECEIVING TYSABRI IV TREATMENT  . Pneumonia     dx  03-03-2014--  admitted for iv and oral antibiotics  . Urinary retention   . Neurogenic bladder   . Foley catheter in place   . Incontinence of urine   . Hyperlipidemia   . Bladder calculi   . Shortness of breath     secondary to fatique  . Chronic fatigue   . Neurogenic bowel     intermittant constipation and diarrhea  . Yeast infection     genital area    Past  Surgical History  Procedure Laterality Date  . Appendectomy  age 39  . Nasal septum surgery  1996  . Negative sleep study  1996  . Transthoracic echocardiogram  02-07-2004    MILD LVH/  EF 55-65%  . Insertion of suprapubic catheter N/A 03/21/2014    Procedure: INSERTION OF SUPRAPUBIC CATHETER;  Surgeon: [name], MD;  Location: [facility];  Service: Urology;  Laterality: N/A;  . Cystoscopy N/A 03/21/2014    Procedure: CYSTOSCOPY TREATMENT OF BLADDER CALCULI;  Surgeon: [name], MD;  Location: [facility];  Service: Urology;  Laterality: N/A;    Family History  Problem Relation Age of Onset  . Stroke Father   . Alcoholism Brother     Social history:  reports that he quit smoking about 28 years ago. His smoking use included Cigarettes. He has a 16 pack-year smoking history. He has never used smokeless tobacco. He reports that he does not drink alcohol or use illicit drugs.    Allergies  Allergen Reactions  . Penicillins Other (See Comments)    Unknown childhoood reaction (takes amoxicillian with reaction)    Medications:  Current Outpatient Prescriptions on File Prior to Visit  Medication Sig Dispense Refill  . amitriptyline (ELAVIL) 100 MG tablet take 1 tablet by mouth  at bedtime  90 tablet  1  . baclofen (LIORESAL) 20 MG tablet take 2 tablets by mouth three times a day  540 tablet  1  . clotrimazole-betamethasone (LOTRISONE) cream Apply 1 application topically 2 (two) times daily as needed.      . Cranberry-Vitamin C-Probiotic (AZO CRANBERRY PO) Take 1 capsule by mouth 2 (two) times daily.       . dantrolene (DANTRIUM) 100 MG capsule take 1 capsule by mouth three times a day  90 capsule  5  . diazepam (VALIUM) 10 MG tablet take 1 tablet by mouth AT NIGHT IF NEEDED  30 tablet  5  . levothyroxine (SYNTHROID, LEVOTHROID) 100 MCG tablet Take 100 mcg by mouth daily before breakfast.       . lisinopril-hydrochlorothiazide (PRINZIDE,ZESTORETIC)  10-12.5 MG per tablet Take 0.5 tablets by mouth every morning.       Marland Kitchen omeprazole (PRILOSEC) 20 MG capsule Take 20 mg by mouth every morning.       Marland Kitchen perphenazine (TRILAFON) 2 MG tablet take 1 tablet by mouth twice a day  60 tablet  0  . sertraline (ZOLOFT) 100 MG tablet Take 200 mg by mouth at bedtime.       Marland Kitchen tiZANidine (ZANAFLEX) 4 MG tablet take 1 tablet by mouth every 4 hours if needed---   pt takes x2  tablets tid      . Vitamin D, Ergocalciferol, (DRISDOL) 50000 UNITS CAPS Take 50,000 Units by mouth every 7 (seven) days. Friday only      . ibuprofen (ADVIL,MOTRIN) 200 MG tablet Take 200 mg by mouth as needed (takes 3 tablets as needed).      . terazosin (HYTRIN) 10 MG capsule Take 10 mg by mouth at bedtime.        No current facility-administered medications on file prior to visit.    ROS:  Out of a complete 14 system review of symptoms, the patient complains only of the following symptoms, and all other reviewed systems are negative.  Chills, fatigue, excessive sweating Ringing in the ears Eye discharge, light sensitivity, double vision, loss of vision, blurred vision Shortness of breath Foot swelling Cold intolerance, heat intolerance Constipation, nausea, rectal pain, incontinence of bowels Daytime sleepiness, snoring Frequent bladder infections Blood in the urine Joint pain, back pain, achy muscles, muscle cramps, neck stiffness Moles Memory loss, dizziness, headache, numbness, speech difficulty, weakness, tremors Agitation, confusion, decreased concentration, depression, anxiety  There were no vitals taken for this visit.  Physical Exam  General: The patient is alert and cooperative at the time of the examination.  Skin: 2+ edema of ankles is noted bilaterally.   Neurologic Exam  Mental status: The patient is oriented x 3.  Cranial nerves: Facial symmetry is present. Speech is normal, no aphasia or dysarthria is noted. Extraocular movements are full, but there  is divergence of gaze, the left eye is inferior to the right. Visual fields are full. Pupils are equal, round, and reactive to light. Discs are flat bilaterally.  Motor: The patient has essentially no voluntary movement of the lower extremities. The patient has a flexure contraction of the right elbow, minimal grip with the right hand, unable to extend the fingers on the right hand, 4/5 strength proximally in the right arm. The left arm, the patient has 4/5 strength with grip, has difficulties extending the fingers, has 4+/5 strength proximally in the left arm. The patient can extend the left elbow completely. Increased motor tone is noted in both legs.  Sensory examination: Soft touch sensation is symmetric on the face, arms, and legs.  Coordination: The patient has the ability to perform finger-nose-finger with the left arm, unable to perform on the right. The patient cannot perform heel-to-shin on either side.  Gait and station: The patient is nonambulatory, in a wheelchair.  Reflexes: Deep tendon reflexes are symmetric.   Assessment/Plan:  One. Multiple sclerosis  2. Spastic quadriparesis  The patient is gradually losing his ability to function, he now requires aid and assistance in his home, he is unable to roll over in bed by himself. He requires a lift to get him out of bed. He will continue Tysabri for now, and he will have blood work done today. He will followup in about 6 months.  Marlan Palau MD 05/15/2014 9:21 AM  [facility] [address] [place], [place] [address]  Phone [phone] Fax [phone]

## 2014-05-13 NOTE — Telephone Encounter (Signed)
Rx signed and faxed.

## 2014-05-14 LAB — CBC WITH DIFFERENTIAL
Basophils Absolute: 0.1 10*3/uL (ref 0.0–0.2)
Basos: 1 %
Eos: 1 %
Eosinophils Absolute: 0.2 10*3/uL (ref 0.0–0.4)
HCT: 45.1 % (ref 37.5–51.0)
Hemoglobin: 15.7 g/dL (ref 12.6–17.7)
Immature Grans (Abs): 0.2 10*3/uL — ABNORMAL HIGH (ref 0.0–0.1)
Immature Granulocytes: 1 %
Lymphocytes Absolute: 2.4 10*3/uL (ref 0.7–3.1)
Lymphs: 15 %
MCH: 30.1 pg (ref 26.6–33.0)
MCHC: 34.8 g/dL (ref 31.5–35.7)
MCV: 86 fL (ref 79–97)
Monocytes Absolute: 0.8 10*3/uL (ref 0.1–0.9)
Monocytes: 5 %
Neutrophils Absolute: 12.3 10*3/uL — ABNORMAL HIGH (ref 1.4–7.0)
Neutrophils Relative %: 77 %
Platelets: 250 10*3/uL (ref 150–379)
RBC: 5.22 x10E6/uL (ref 4.14–5.80)
RDW: 14.6 % (ref 12.3–15.4)
WBC: 15.8 10*3/uL — ABNORMAL HIGH (ref 3.4–10.8)

## 2014-05-14 LAB — COMPREHENSIVE METABOLIC PANEL
ALT: 15 IU/L (ref 0–44)
AST: 14 IU/L (ref 0–40)
Albumin/Globulin Ratio: 2.2 (ref 1.1–2.5)
Albumin: 4.9 g/dL (ref 3.5–5.5)
Alkaline Phosphatase: 103 IU/L (ref 39–117)
BUN/Creatinine Ratio: 20 (ref 9–20)
BUN: 12 mg/dL (ref 6–24)
CO2: 20 mmol/L (ref 18–29)
Calcium: 9.9 mg/dL (ref 8.7–10.2)
Chloride: 95 mmol/L — ABNORMAL LOW (ref 97–108)
Creatinine, Ser: 0.59 mg/dL — ABNORMAL LOW (ref 0.76–1.27)
GFR calc Af Amer: 134 mL/min/{1.73_m2} (ref >59)
GFR calc non Af Amer: 116 mL/min/{1.73_m2} (ref >59)
Globulin, Total: 2.2 g/dL (ref 1.5–4.5)
Glucose: 112 mg/dL — ABNORMAL HIGH (ref 65–99)
Potassium: 3.8 mmol/L (ref 3.5–5.2)
Sodium: 137 mmol/L (ref 134–144)
Total Bilirubin: 0.3 mg/dL (ref 0.0–1.2)
Total Protein: 7.1 g/dL (ref 6.0–8.5)

## 2014-05-20 ENCOUNTER — Telehealth: Payer: Self-pay | Admitting: Neurology

## 2014-05-20 ENCOUNTER — Other Ambulatory Visit: Payer: Self-pay | Admitting: Neurology

## 2014-05-20 NOTE — Telephone Encounter (Signed)
The JC virus antibodies were negative. The patient is to continue the Tysabri.

## 2014-05-30 ENCOUNTER — Other Ambulatory Visit: Payer: Self-pay | Admitting: Neurology

## 2014-05-31 ENCOUNTER — Encounter (HOSPITAL_COMMUNITY)
Admission: RE | Admit: 2014-05-31 | Discharge: 2014-05-31 | Disposition: A | Discharge status: Home / Self Care | Payer: Medicare Other | Source: Ambulatory Visit | Attending: Neurology | Admitting: Neurology

## 2014-05-31 ENCOUNTER — Encounter (HOSPITAL_COMMUNITY): Payer: Self-pay

## 2014-05-31 VITALS — BP 118/84 | HR 83 | Temp 97.4°F | Resp 16

## 2014-05-31 DIAGNOSIS — G35 Multiple sclerosis: Secondary | ICD-10-CM | POA: Diagnosis not present

## 2014-05-31 MED ORDER — LORATADINE 10 MG PO TABS
10.0000 mg | ORAL_TABLET | ORAL | Status: AC
  Administered 2014-05-31: 10 mg via ORAL
  Filled 2014-05-31: qty 1

## 2014-05-31 MED ORDER — ACETAMINOPHEN 325 MG PO TABS
650.0000 mg | ORAL_TABLET | ORAL | Status: AC
  Administered 2014-05-31: 650 mg via ORAL
  Filled 2014-05-31: qty 2

## 2014-05-31 MED ORDER — NATALIZUMAB 300 MG/15ML IV CONC
300.0000 mg | INTRAVENOUS | Status: DC
  Administered 2014-05-31: 300 mg via INTRAVENOUS
  Filled 2014-05-31: qty 15

## 2014-05-31 MED ORDER — SODIUM CHLORIDE 0.9 % IV SOLN
INTRAVENOUS | Status: DC
  Administered 2014-05-31: 10:00:00 via INTRAVENOUS

## 2014-05-31 NOTE — Telephone Encounter (Signed)
Per note from 11/18/13

## 2014-05-31 NOTE — Progress Notes (Signed)
Patient transferred to stretcher for Tysabri infusion via a hoyer lift. He states it is painful to stay in his w/c for too long. Infusion completed. Patient stayed for out of one hour recommended observation time post tysabri. He stated he needs to leave and will call MD if any problem. Family member and caregiver at bedside.

## 2014-06-21 ENCOUNTER — Other Ambulatory Visit: Payer: Self-pay | Admitting: Neurology

## 2014-06-28 ENCOUNTER — Encounter (HOSPITAL_COMMUNITY)
Admission: RE | Admit: 2014-06-28 | Discharge: 2014-06-28 | Disposition: A | Discharge status: Home / Self Care | Payer: Medicare Other | Source: Ambulatory Visit | Attending: Neurology | Admitting: Neurology

## 2014-06-28 VITALS — BP 129/80 | HR 84 | Temp 98.7°F | Resp 16 | Ht 73.0 in | Wt 174.0 lb

## 2014-06-28 DIAGNOSIS — G35 Multiple sclerosis: Secondary | ICD-10-CM | POA: Diagnosis present

## 2014-06-28 MED ORDER — SODIUM CHLORIDE 0.9 % IV SOLN
300.0000 mg | INTRAVENOUS | Status: DC
  Administered 2014-06-28: 300 mg via INTRAVENOUS
  Filled 2014-06-28: qty 15

## 2014-06-28 MED ORDER — LORATADINE 10 MG PO TABS
10.0000 mg | ORAL_TABLET | ORAL | Status: AC
  Administered 2014-06-28: 10 mg via ORAL
  Filled 2014-06-28: qty 1

## 2014-06-28 MED ORDER — ACETAMINOPHEN 325 MG PO TABS
650.0000 mg | ORAL_TABLET | ORAL | Status: AC
  Administered 2014-06-28: 650 mg via ORAL
  Filled 2014-06-28: qty 2

## 2014-06-28 MED ORDER — SODIUM CHLORIDE 0.9 % IV SOLN
INTRAVENOUS | Status: AC
  Administered 2014-06-28: 13:00:00 via INTRAVENOUS

## 2014-06-28 NOTE — Progress Notes (Signed)
Patient tolerated Tysabri treatment well. Used hoyer lift to transfer him from his electric wheelchair to stretcher for treatment. Patient stayed 20 minutes post infusion and declined to stay full hour for observation. Home with caregiver and instructed to call MD for any problems.

## 2014-07-10 ENCOUNTER — Emergency Department (HOSPITAL_COMMUNITY): Payer: Medicare Other

## 2014-07-10 ENCOUNTER — Encounter (HOSPITAL_COMMUNITY): Payer: Self-pay | Admitting: *Deleted

## 2014-07-10 ENCOUNTER — Inpatient Hospital Stay (HOSPITAL_COMMUNITY)
Admission: EM | Admit: 2014-07-10 | Discharge: 2014-07-14 | DRG: 871 | Disposition: A | Discharge status: Home Health | Payer: Medicare Other | Attending: Internal Medicine | Admitting: Internal Medicine

## 2014-07-10 ENCOUNTER — Inpatient Hospital Stay (HOSPITAL_COMMUNITY): Payer: Medicare Other

## 2014-07-10 DIAGNOSIS — G8114 Spastic hemiplegia affecting left nondominant side: Secondary | ICD-10-CM | POA: Diagnosis present

## 2014-07-10 DIAGNOSIS — Z87891 Personal history of nicotine dependence: Secondary | ICD-10-CM | POA: Diagnosis not present

## 2014-07-10 DIAGNOSIS — F419 Anxiety disorder, unspecified: Secondary | ICD-10-CM | POA: Diagnosis present

## 2014-07-10 DIAGNOSIS — Z880 Allergy status to penicillin: Secondary | ICD-10-CM

## 2014-07-10 DIAGNOSIS — Z8619 Personal history of other infectious and parasitic diseases: Secondary | ICD-10-CM | POA: Diagnosis present

## 2014-07-10 DIAGNOSIS — K219 Gastro-esophageal reflux disease without esophagitis: Secondary | ICD-10-CM | POA: Diagnosis present

## 2014-07-10 DIAGNOSIS — I10 Essential (primary) hypertension: Secondary | ICD-10-CM | POA: Diagnosis present

## 2014-07-10 DIAGNOSIS — E876 Hypokalemia: Secondary | ICD-10-CM | POA: Diagnosis present

## 2014-07-10 DIAGNOSIS — Z79899 Other long term (current) drug therapy: Secondary | ICD-10-CM | POA: Diagnosis not present

## 2014-07-10 DIAGNOSIS — N319 Neuromuscular dysfunction of bladder, unspecified: Secondary | ICD-10-CM | POA: Diagnosis present

## 2014-07-10 DIAGNOSIS — B961 Klebsiella pneumoniae [K. pneumoniae] as the cause of diseases classified elsewhere: Secondary | ICD-10-CM | POA: Diagnosis present

## 2014-07-10 DIAGNOSIS — E860 Dehydration: Secondary | ICD-10-CM | POA: Diagnosis present

## 2014-07-10 DIAGNOSIS — E039 Hypothyroidism, unspecified: Secondary | ICD-10-CM | POA: Diagnosis present

## 2014-07-10 DIAGNOSIS — R059 Cough, unspecified: Secondary | ICD-10-CM

## 2014-07-10 DIAGNOSIS — Z79891 Long term (current) use of opiate analgesic: Secondary | ICD-10-CM

## 2014-07-10 DIAGNOSIS — Z791 Long term (current) use of non-steroidal anti-inflammatories (NSAID): Secondary | ICD-10-CM

## 2014-07-10 DIAGNOSIS — E440 Moderate protein-calorie malnutrition: Secondary | ICD-10-CM | POA: Insufficient documentation

## 2014-07-10 DIAGNOSIS — Z87442 Personal history of urinary calculi: Secondary | ICD-10-CM | POA: Diagnosis not present

## 2014-07-10 DIAGNOSIS — Z7401 Bed confinement status: Secondary | ICD-10-CM

## 2014-07-10 DIAGNOSIS — F329 Major depressive disorder, single episode, unspecified: Secondary | ICD-10-CM | POA: Diagnosis present

## 2014-07-10 DIAGNOSIS — Z823 Family history of stroke: Secondary | ICD-10-CM

## 2014-07-10 DIAGNOSIS — E785 Hyperlipidemia, unspecified: Secondary | ICD-10-CM | POA: Diagnosis present

## 2014-07-10 DIAGNOSIS — Z993 Dependence on wheelchair: Secondary | ICD-10-CM | POA: Diagnosis not present

## 2014-07-10 DIAGNOSIS — N3289 Other specified disorders of bladder: Secondary | ICD-10-CM | POA: Diagnosis not present

## 2014-07-10 DIAGNOSIS — R05 Cough: Secondary | ICD-10-CM | POA: Diagnosis present

## 2014-07-10 DIAGNOSIS — A4159 Other Gram-negative sepsis: Principal | ICD-10-CM | POA: Diagnosis present

## 2014-07-10 DIAGNOSIS — G35 Multiple sclerosis: Secondary | ICD-10-CM | POA: Diagnosis present

## 2014-07-10 DIAGNOSIS — Z6827 Body mass index (BMI) 27.0-27.9, adult: Secondary | ICD-10-CM

## 2014-07-10 DIAGNOSIS — R112 Nausea with vomiting, unspecified: Secondary | ICD-10-CM | POA: Diagnosis present

## 2014-07-10 DIAGNOSIS — R1084 Generalized abdominal pain: Secondary | ICD-10-CM

## 2014-07-10 DIAGNOSIS — N390 Urinary tract infection, site not specified: Secondary | ICD-10-CM | POA: Diagnosis present

## 2014-07-10 DIAGNOSIS — G8250 Quadriplegia, unspecified: Secondary | ICD-10-CM | POA: Diagnosis present

## 2014-07-10 DIAGNOSIS — N210 Calculus in bladder: Secondary | ICD-10-CM

## 2014-07-10 DIAGNOSIS — R300 Dysuria: Secondary | ICD-10-CM

## 2014-07-10 DIAGNOSIS — R109 Unspecified abdominal pain: Secondary | ICD-10-CM

## 2014-07-10 DIAGNOSIS — A419 Sepsis, unspecified organism: Secondary | ICD-10-CM

## 2014-07-10 DIAGNOSIS — L0889 Other specified local infections of the skin and subcutaneous tissue: Secondary | ICD-10-CM | POA: Diagnosis present

## 2014-07-10 DIAGNOSIS — N3000 Acute cystitis without hematuria: Secondary | ICD-10-CM

## 2014-07-10 LAB — URINALYSIS, ROUTINE W REFLEX MICROSCOPIC
Bilirubin Urine: NEGATIVE
Glucose, UA: NEGATIVE mg/dL
Ketones, ur: NEGATIVE mg/dL
Nitrite: POSITIVE — AB
Protein, ur: 100 mg/dL — AB
Specific Gravity, Urine: 1.02 (ref 1.005–1.030)
Urobilinogen, UA: 1 mg/dL (ref 0.0–1.0)
pH: 6 (ref 5.0–8.0)

## 2014-07-10 LAB — BASIC METABOLIC PANEL
Anion gap: 15 (ref 5–15)
BUN: 10 mg/dL (ref 6–23)
CO2: 25 mEq/L (ref 19–32)
Calcium: 9.7 mg/dL (ref 8.4–10.5)
Chloride: 95 mEq/L — ABNORMAL LOW (ref 96–112)
Creatinine, Ser: 0.51 mg/dL (ref 0.50–1.35)
GFR calc Af Amer: >90 mL/min (ref >90)
GFR calc non Af Amer: >90 mL/min (ref >90)
Glucose, Bld: 121 mg/dL — ABNORMAL HIGH (ref 70–99)
Potassium: 3.4 mEq/L — ABNORMAL LOW (ref 3.7–5.3)
Sodium: 135 mEq/L — ABNORMAL LOW (ref 137–147)

## 2014-07-10 LAB — CBC WITH DIFFERENTIAL/PLATELET
Basophils Absolute: 0 10*3/uL (ref 0.0–0.1)
Basophils Relative: 0 % (ref 0–1)
Eosinophils Absolute: 0.1 10*3/uL (ref 0.0–0.7)
Eosinophils Relative: 1 % (ref 0–5)
HCT: 40.7 % (ref 39.0–52.0)
Hemoglobin: 13.6 g/dL (ref 13.0–17.0)
Lymphocytes Relative: 10 % — ABNORMAL LOW (ref 12–46)
Lymphs Abs: 1.2 10*3/uL (ref 0.7–4.0)
MCH: 29.9 pg (ref 26.0–34.0)
MCHC: 33.4 g/dL (ref 30.0–36.0)
MCV: 89.5 fL (ref 78.0–100.0)
Monocytes Absolute: 1.3 10*3/uL — ABNORMAL HIGH (ref 0.1–1.0)
Monocytes Relative: 11 % (ref 3–12)
Neutro Abs: 9.9 10*3/uL — ABNORMAL HIGH (ref 1.7–7.7)
Neutrophils Relative %: 78 % — ABNORMAL HIGH (ref 43–77)
Platelets: 180 10*3/uL (ref 150–400)
RBC: 4.55 MIL/uL (ref 4.22–5.81)
RDW: 14.1 % (ref 11.5–15.5)
WBC: 12.5 10*3/uL — ABNORMAL HIGH (ref 4.0–10.5)

## 2014-07-10 LAB — URINE MICROSCOPIC-ADD ON

## 2014-07-10 LAB — I-STAT CG4 LACTIC ACID, ED: Lactic Acid, Venous: 1.29 mmol/L (ref 0.5–2.2)

## 2014-07-10 MED ORDER — DEXTROSE 5 % IV SOLN: 1.0000 g | Freq: Three times a day (TID) | INTRAVENOUS | Status: DC

## 2014-07-10 MED ORDER — SERTRALINE HCL 100 MG PO TABS
200.0000 mg | ORAL_TABLET | Freq: Every day | ORAL | Status: DC
  Administered 2014-07-10 – 2014-07-13 (×4): 200 mg via ORAL
  Filled 2014-07-10 (×5): qty 2

## 2014-07-10 MED ORDER — ENOXAPARIN SODIUM 40 MG/0.4ML ~~LOC~~ SOLN
40.0000 mg | SUBCUTANEOUS | Status: DC
  Administered 2014-07-10 – 2014-07-14 (×5): 40 mg via SUBCUTANEOUS
  Filled 2014-07-10 (×5): qty 0.4

## 2014-07-10 MED ORDER — POTASSIUM CHLORIDE IN NACL 20-0.9 MEQ/L-% IV SOLN
INTRAVENOUS | Status: AC
  Administered 2014-07-10 – 2014-07-12 (×4): via INTRAVENOUS
  Filled 2014-07-10 (×4): qty 1000

## 2014-07-10 MED ORDER — ACETAMINOPHEN 325 MG PO TABS
650.0000 mg | ORAL_TABLET | Freq: Four times a day (QID) | ORAL | Status: DC | PRN
  Administered 2014-07-10 – 2014-07-12 (×2): 650 mg via ORAL
  Filled 2014-07-10 (×2): qty 2

## 2014-07-10 MED ORDER — PERPHENAZINE 2 MG PO TABS
2.0000 mg | ORAL_TABLET | Freq: Two times a day (BID) | ORAL | Status: DC
  Administered 2014-07-10 – 2014-07-14 (×8): 2 mg via ORAL
  Filled 2014-07-10 (×10): qty 1

## 2014-07-10 MED ORDER — OXYCODONE HCL 5 MG PO TABS
5.0000 mg | ORAL_TABLET | ORAL | Status: DC | PRN
  Administered 2014-07-10 – 2014-07-11 (×2): 5 mg via ORAL
  Filled 2014-07-10 (×2): qty 1

## 2014-07-10 MED ORDER — BACLOFEN 20 MG PO TABS
40.0000 mg | ORAL_TABLET | Freq: Three times a day (TID) | ORAL | Status: DC
  Administered 2014-07-10 – 2014-07-14 (×11): 40 mg via ORAL
  Filled 2014-07-10 (×14): qty 2

## 2014-07-10 MED ORDER — SODIUM CHLORIDE 0.9 % IV BOLUS (SEPSIS)
1000.0000 mL | Freq: Once | INTRAVENOUS | Status: AC
  Administered 2014-07-10: 1000 mL via INTRAVENOUS

## 2014-07-10 MED ORDER — DOCUSATE SODIUM 100 MG PO CAPS
100.0000 mg | ORAL_CAPSULE | Freq: Three times a day (TID) | ORAL | Status: DC
  Administered 2014-07-10 – 2014-07-14 (×13): 100 mg via ORAL
  Filled 2014-07-10 (×16): qty 1

## 2014-07-10 MED ORDER — LEVOTHYROXINE SODIUM 100 MCG PO TABS
100.0000 ug | ORAL_TABLET | Freq: Every day | ORAL | Status: DC
  Administered 2014-07-11 – 2014-07-14 (×4): 100 ug via ORAL
  Filled 2014-07-10 (×5): qty 1

## 2014-07-10 MED ORDER — ZOLPIDEM TARTRATE 5 MG PO TABS
5.0000 mg | ORAL_TABLET | Freq: Once | ORAL | Status: AC
  Administered 2014-07-10: 5 mg via ORAL
  Filled 2014-07-10: qty 1

## 2014-07-10 MED ORDER — ONDANSETRON HCL 4 MG PO TABS
4.0000 mg | ORAL_TABLET | Freq: Four times a day (QID) | ORAL | Status: DC | PRN
  Administered 2014-07-11: 4 mg via ORAL
  Filled 2014-07-10: qty 1

## 2014-07-10 MED ORDER — DANTROLENE SODIUM 100 MG PO CAPS
100.0000 mg | ORAL_CAPSULE | Freq: Three times a day (TID) | ORAL | Status: DC
  Administered 2014-07-10 – 2014-07-14 (×13): 100 mg via ORAL
  Filled 2014-07-10 (×15): qty 1

## 2014-07-10 MED ORDER — MIRABEGRON ER 50 MG PO TB24
50.0000 mg | ORAL_TABLET | Freq: Every day | ORAL | Status: DC
  Administered 2014-07-10 – 2014-07-14 (×5): 50 mg via ORAL
  Filled 2014-07-10 (×5): qty 1

## 2014-07-10 MED ORDER — MORPHINE SULFATE 2 MG/ML IJ SOLN
1.0000 mg | INTRAMUSCULAR | Status: DC | PRN
  Administered 2014-07-11 – 2014-07-14 (×4): 1 mg via INTRAVENOUS
  Filled 2014-07-10 (×4): qty 1

## 2014-07-10 MED ORDER — ACETAMINOPHEN 650 MG RE SUPP
650.0000 mg | RECTAL | Status: DC | PRN
  Administered 2014-07-10: 650 mg via RECTAL
  Filled 2014-07-10: qty 1

## 2014-07-10 MED ORDER — AMITRIPTYLINE HCL 100 MG PO TABS
100.0000 mg | ORAL_TABLET | Freq: Every day | ORAL | Status: DC
  Administered 2014-07-10 – 2014-07-13 (×4): 100 mg via ORAL
  Filled 2014-07-10 (×5): qty 1

## 2014-07-10 MED ORDER — VANCOMYCIN HCL IN DEXTROSE 1-5 GM/200ML-% IV SOLN
1000.0000 mg | Freq: Two times a day (BID) | INTRAVENOUS | Status: DC
  Filled 2014-07-10: qty 200

## 2014-07-10 MED ORDER — TIZANIDINE HCL 4 MG PO TABS
8.0000 mg | ORAL_TABLET | Freq: Three times a day (TID) | ORAL | Status: DC
  Administered 2014-07-10 – 2014-07-14 (×11): 8 mg via ORAL
  Filled 2014-07-10 (×14): qty 2

## 2014-07-10 MED ORDER — PROMETHAZINE HCL 25 MG/ML IJ SOLN
12.5000 mg | Freq: Once | INTRAMUSCULAR | Status: AC
  Administered 2014-07-10: 12.5 mg via INTRAVENOUS
  Filled 2014-07-10: qty 1

## 2014-07-10 MED ORDER — TIZANIDINE HCL 4 MG PO CAPS: 8.0000 mg | ORAL_CAPSULE | Freq: Three times a day (TID) | ORAL | Status: DC

## 2014-07-10 MED ORDER — DIAZEPAM 5 MG PO TABS: 10.0000 mg | ORAL_TABLET | Freq: Every evening | ORAL | Status: DC | PRN

## 2014-07-10 MED ORDER — DIAZEPAM 5 MG PO TABS
10.0000 mg | ORAL_TABLET | Freq: Every evening | ORAL | Status: DC | PRN
  Administered 2014-07-10 – 2014-07-13 (×4): 10 mg via ORAL
  Filled 2014-07-10 (×4): qty 2

## 2014-07-10 MED ORDER — CLONAZEPAM 1 MG PO TABS
1.0000 mg | ORAL_TABLET | Freq: Two times a day (BID) | ORAL | Status: DC
  Administered 2014-07-10 – 2014-07-14 (×8): 1 mg via ORAL
  Filled 2014-07-10 (×8): qty 1

## 2014-07-10 MED ORDER — ACETAMINOPHEN 650 MG RE SUPP: 650.0000 mg | Freq: Four times a day (QID) | RECTAL | Status: DC | PRN

## 2014-07-10 MED ORDER — VANCOMYCIN HCL IN DEXTROSE 1-5 GM/200ML-% IV SOLN
1000.0000 mg | INTRAVENOUS | Status: AC
  Administered 2014-07-10: 1000 mg via INTRAVENOUS
  Filled 2014-07-10: qty 200

## 2014-07-10 MED ORDER — DEXTROSE 5 % IV SOLN
1.0000 g | INTRAVENOUS | Status: DC
  Administered 2014-07-10 – 2014-07-13 (×4): 1 g via INTRAVENOUS
  Filled 2014-07-10 (×4): qty 10

## 2014-07-10 MED ORDER — PANTOPRAZOLE SODIUM 40 MG PO TBEC
40.0000 mg | DELAYED_RELEASE_TABLET | Freq: Every day | ORAL | Status: DC
  Administered 2014-07-10 – 2014-07-14 (×5): 40 mg via ORAL
  Filled 2014-07-10 (×6): qty 1

## 2014-07-10 MED ORDER — ONDANSETRON 4 MG PO TBDP
4.0000 mg | ORAL_TABLET | Freq: Once | ORAL | Status: AC
  Administered 2014-07-10: 4 mg via ORAL
  Filled 2014-07-10: qty 1

## 2014-07-10 MED ORDER — ONDANSETRON HCL 4 MG/2ML IJ SOLN
4.0000 mg | Freq: Four times a day (QID) | INTRAMUSCULAR | Status: DC | PRN
  Administered 2014-07-10 – 2014-07-11 (×3): 4 mg via INTRAVENOUS
  Filled 2014-07-10 (×3): qty 2

## 2014-07-10 MED ORDER — BACITRACIN-NEOMYCIN-POLYMYXIN OINTMENT TUBE
TOPICAL_OINTMENT | Freq: Three times a day (TID) | CUTANEOUS | Status: DC
  Administered 2014-07-10 – 2014-07-14 (×11): via TOPICAL
  Filled 2014-07-10 (×2): qty 15

## 2014-07-10 MED ORDER — CEFEPIME HCL 2 G IJ SOLR
2.0000 g | Freq: Once | INTRAMUSCULAR | Status: AC
  Administered 2014-07-10: 2 g via INTRAVENOUS
  Filled 2014-07-10: qty 2

## 2014-07-10 NOTE — ED Notes (Signed)
Bed: HW86 Expected date:  Expected time:  Means of arrival:  Comments: EMS 53yo M, fever, chills N/V, ? UTI indwelling cath

## 2014-07-10 NOTE — H&P (Signed)
[facility] History and Physical  ASMAR BROZEK WVP:[id number] DOB: 1961/06/23 DOA: 07/10/2014   PCP: Stephens Shire, MD  Specialists: Dr. Risa Grill is his urologist  Chief Complaint: fever, chills, nausea and vomiting since this morning  HPI: Samuel [name] is a 53 y.o. 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 He also has a history of hypertension and hypothyroidism. He was in his usual state of health about 2-3 days ago when he had his suprapubic catheter replaced. He has been experiencing some bladder spasms and incontinence as a result of which he started using a condom catheter since Saturday. And then he started noticing that his urine was turning dark. Denies any blood in the urine. This morning he woke up at about 4:30 AM with chills and sweating episode. He had a fever of 102F. He had 3 episodes of bilious, nonbloody vomiting. Denies any abdominal pain per se. Denies any diarrhea. He subsequently decided to come into the hospital. Has had generalized weakness.  Home Medications: Prior to Admission medications   Medication Sig Start Date End Date Taking? Authorizing Provider  amitriptyline (ELAVIL) 100 MG tablet Take 100 mg by mouth at bedtime.   Yes Historical Provider, MD  baclofen (LIORESAL) 20 MG tablet Take 40 mg by mouth 3 (three) times daily.   Yes Historical Provider, MD  clonazePAM (KLONOPIN) 1 MG tablet Take 1 mg by mouth 2 (two) times daily.   Yes Historical Provider, MD  clotrimazole-betamethasone (LOTRISONE) cream Apply 1 application topically 2 (two) times daily as needed (for skin irritations).    Yes Historical Provider, MD  Cranberry-Vitamin C-Probiotic (AZO CRANBERRY PO) Take 1 capsule by mouth 2 (two) times daily.    Yes Historical Provider, MD  dantrolene (DANTRIUM) 100 MG capsule Take 100 mg by mouth 3 (three) times  daily.   Yes Historical Provider, MD  diazepam (VALIUM) 10 MG tablet Take 10 mg by mouth at bedtime as needed for anxiety.   Yes Historical Provider, MD  docusate sodium (COLACE) 100 MG capsule Take 100 mg by mouth 3 (three) times daily.   Yes Historical Provider, MD  ibuprofen (ADVIL,MOTRIN) 200 MG tablet Take 600 mg by mouth as needed for fever or moderate pain.    Yes Historical Provider, MD  levothyroxine (SYNTHROID, LEVOTHROID) 100 MCG tablet Take 100 mcg by mouth daily before breakfast.    Yes Historical Provider, MD  lisinopril-hydrochlorothiazide (PRINZIDE,ZESTORETIC) 10-12.5 MG per tablet Take 0.5 tablets by mouth every morning.    Yes Historical Provider, MD  mirabegron ER (MYRBETRIQ) 50 MG TB24 tablet Take 50 mg by mouth daily.   Yes Historical Provider, MD  omeprazole (PRILOSEC) 20 MG capsule Take 20 mg by mouth every morning.    Yes Historical Provider, MD  perphenazine (TRILAFON) 2 MG tablet Take 2 mg by mouth 2 (two) times daily.   Yes Historical Provider, MD  sertraline (ZOLOFT) 100 MG tablet Take 200 mg by mouth at bedtime.    Yes Historical Provider, MD  solifenacin (VESICARE) 10 MG tablet Take 10 mg by mouth daily.   Yes Historical Provider, MD  tiZANidine (ZANAFLEX) 4 MG capsule Take 8 mg by mouth 3 (three) times daily.   Yes Historical Provider, MD  traMADol (ULTRAM) 50 MG tablet Take 1 tablet (50 mg total) by mouth every 6 (six) hours as needed. Patient taking differently: Take 50 mg by mouth every 6 (six)  hours as needed for moderate pain.  05/13/14  Yes Kathrynn Ducking, MD  Vitamin D, Ergocalciferol, (DRISDOL) 50000 UNITS CAPS Take 50,000 Units by mouth every 7 (seven) days. Friday only   Yes Historical Provider, MD  amitriptyline (ELAVIL) 100 MG tablet take 1 tablet by mouth at bedtime Patient not taking: Reported on 07/10/2014 01/25/14   Kathrynn Ducking, MD  baclofen (LIORESAL) 20 MG tablet take 2 tablets by mouth three times a day Patient not taking: Reported on 07/10/2014  06/21/14   Kathrynn Ducking, MD  clonazePAM Bobbye [place]) 1 MG tablet take 1 tablet by mouth twice a day Patient not taking: Reported on 07/10/2014 05/13/14   Kathrynn Ducking, MD  dantrolene (DANTRIUM) 100 MG capsule take 1 capsule by mouth three times a day Patient not taking: Reported on 07/10/2014 04/04/14   Kathrynn Ducking, MD  diazepam (VALIUM) 10 MG tablet take 1 tablet by mouth AT NIGHT IF NEEDED Patient not taking: Reported on 07/10/2014 03/23/14   Kathrynn Ducking, MD  HYDROcodone-acetaminophen (NORCO/VICODIN) 5-325 MG per tablet Take 1-2 tablets by mouth every 6 (six) hours as needed. 05/13/14   Kathrynn Ducking, MD  perphenazine (TRILAFON) 2 MG tablet take 1 tablet by mouth twice a day Patient not taking: Reported on 07/10/2014 05/31/14   Kathrynn Ducking, MD  terazosin (HYTRIN) 10 MG capsule Take 10 mg by mouth at bedtime.     Historical Provider, MD  tiZANidine (ZANAFLEX) 4 MG tablet take 1 tablet by mouth every 4 hours if needed Patient not taking: Reported on 07/10/2014 05/20/14   Kathrynn Ducking, MD    Allergies:  Allergies  Allergen Reactions  . Penicillins Other (See Comments)    Unknown childhoood reaction (takes amoxicillian with reaction)    Past Medical History: Past Medical History  Diagnosis Date  . Hypertension   . GERD (gastroesophageal reflux disease)   . Hypothyroidism   . Vitamin D deficiency   . Chronic back pain   . Gait disorder   . 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 Multiple sclerosis NEUROLOGIST-- DR Jannifer Franklin    dx 1993, JC virus negative 2/14---  CURRENTLY RECEIVING TYSABRI IV TREATMENT  . Pneumonia     dx  03-03-2014--  admitted for iv and oral antibiotics  . Urinary retention   . Neurogenic bladder   . Foley catheter in place   . Incontinence of urine   . Hyperlipidemia   . Bladder calculi   . Shortness of breath     secondary to fatique  . Chronic fatigue   . Neurogenic bowel     intermittant constipation and diarrhea  . Yeast infection      genital area    Past Surgical History  Procedure Laterality Date  . Appendectomy  age 57  . Nasal septum surgery  1996  . Negative sleep study  1996  . Transthoracic echocardiogram  02-07-2004    MILD LVH/  EF 55-65%  . Insertion of suprapubic catheter N/A 03/21/2014    Procedure: INSERTION OF SUPRAPUBIC CATHETER;  Surgeon: Bernestine Amass, MD;  Location: [facility];  Service: Urology;  Laterality: N/A;  . Cystoscopy N/A 03/21/2014    Procedure: CYSTOSCOPY TREATMENT OF BLADDER CALCULI;  Surgeon: Bernestine Amass, MD;  Location: [facility];  Service: Urology;  Laterality: N/A;    Social History: he lives in [place] by himself. However, his family takes care of him, including his mother, sister, brother. He also has a  caregiver. He requires assistance with feeding. No smoking use. No alcohol use and denies any illicit drug use. Gets around in a wheelchair.  Family History:  Family History  Problem Relation Age of Onset  . Stroke Father   . Alcoholism Brother      Review of Systems - History obtained from the patient General ROS: positive for  - chills, fatigue and fever Psychological ROS: negative Ophthalmic ROS: negative ENT ROS: negative Allergy and Immunology ROS: negative Hematological and Lymphatic ROS: negative Endocrine ROS: negative Respiratory ROS: no cough, shortness of breath, or wheezing Cardiovascular ROS: no chest pain or dyspnea on exertion Gastrointestinal ROS: no abdominal pain, change in bowel habits, or black or bloody stools Genito-Urinary ROS: as in hpi Musculoskeletal ROS: negative Neurological ROS: chronic MS related complications Dermatological ROS: negative  Physical Examination  Filed Vitals:   07/10/14 0747 07/10/14 0915 07/10/14 0933 07/10/14 1137  BP:   120/72 107/70  Pulse:   100 105  Temp: 101.9 F (38.8 C) 101.1 F (38.4 C) 99.6 F (37.6 C) 98.5 F (36.9 C)  TempSrc: Rectal Rectal Oral Oral  Resp:   16 17   SpO2:   95% 98%    BP 107/70 mmHg  Pulse 105  Temp(Src) 98.5 F (36.9 C) (Oral)  Resp 17  SpO2 98%  General appearance: alert, cooperative, appears stated age and no distress Head: Normocephalic, without obvious abnormality, atraumatic Eyes: conjunctivae/corneas clear. PERRL, EOM's intact.  Throat: lips, mucosa, and tongue normal; teeth and gums normal Resp: clear to auscultation bilaterally Cardio: regular rate and rhythm, S1, S2 normal, no murmur, click, rub or gallop GI: abdomen is soft. Minimal tenderness in the suprapubic area without any rebound, rigidity or guarding. Suprapubic catheter is noted. No masses or organomegaly. There is a yellowish should discharge from around the suprapubic catheter site. No blood. Extremities: he is noted to have some mild edema and erythematous discoloration of his lower extremities which patient says is chronic for him. His upper extremities are somewhat contracted. He is able to extend his left arm completely, but not his left hand. He has contracture of his right arm at the elbow. Pulses: 2+ and symmetric Skin: discharge from around the suprapubic catheter site as discussed above. Neurologic: he is alert, oriented 3. Spastic quadriparesis.  Laboratory Data: Results for orders placed or performed during the hospital encounter of 07/10/14 (from the past 48 hour(s))  Urinalysis, Routine w reflex microscopic     Status: Abnormal   Collection Time: 07/10/14  7:35 AM  Result Value Ref Range   Color, Urine ORANGE (A) YELLOW    Comment: BIOCHEMICALS MAY BE AFFECTED BY COLOR   APPearance TURBID (A) CLEAR   Specific Gravity, Urine 1.020 1.005 - 1.030   pH 6.0 5.0 - 8.0   Glucose, UA NEGATIVE NEGATIVE mg/dL   Hgb urine dipstick SMALL (A) NEGATIVE   Bilirubin Urine NEGATIVE NEGATIVE   Ketones, ur NEGATIVE NEGATIVE mg/dL   Protein, ur 100 (A) NEGATIVE mg/dL   Urobilinogen, UA 1.0 0.0 - 1.0 mg/dL   Nitrite POSITIVE (A) NEGATIVE   Leukocytes, UA  MODERATE (A) NEGATIVE  Urine microscopic-add on     Status: Abnormal   Collection Time: 07/10/14  7:35 AM  Result Value Ref Range   WBC, UA 21-50 <3 WBC/hpf   RBC / HPF 0-2 <3 RBC/hpf   Bacteria, UA MANY (A) RARE   Casts GRANULAR CAST (A) NEGATIVE   Crystals CA OXALATE CRYSTALS (A) NEGATIVE  CBC with Differential  Status: Abnormal   Collection Time: 07/10/14  8:52 AM  Result Value Ref Range   WBC 12.5 (H) 4.0 - 10.5 K/uL   RBC 4.55 4.22 - 5.81 MIL/uL   Hemoglobin 13.6 13.0 - 17.0 g/dL   HCT 40.7 39.0 - 52.0 %   MCV 89.5 78.0 - 100.0 fL   MCH 29.9 26.0 - 34.0 pg   MCHC 33.4 30.0 - 36.0 g/dL   RDW 14.1 11.5 - 15.5 %   Platelets 180 150 - 400 K/uL   Neutrophils Relative % 78 (H) 43 - 77 %   Neutro Abs 9.9 (H) 1.7 - 7.7 K/uL   Lymphocytes Relative 10 (L) 12 - 46 %   Lymphs Abs 1.2 0.7 - 4.0 K/uL   Monocytes Relative 11 3 - 12 %   Monocytes Absolute 1.3 (H) 0.1 - 1.0 K/uL   Eosinophils Relative 1 0 - 5 %   Eosinophils Absolute 0.1 0.0 - 0.7 K/uL   Basophils Relative 0 0 - 1 %   Basophils Absolute 0.0 0.0 - 0.1 K/uL  Basic metabolic panel     Status: Abnormal   Collection Time: 07/10/14  8:52 AM  Result Value Ref Range   Sodium 135 (L) 137 - 147 mEq/L   Potassium 3.4 (L) 3.7 - 5.3 mEq/L   Chloride 95 (L) 96 - 112 mEq/L   CO2 25 19 - 32 mEq/L   Glucose, Bld 121 (H) 70 - 99 mg/dL   BUN 10 6 - 23 mg/dL   Creatinine, Ser 0.51 0.50 - 1.35 mg/dL   Calcium 9.7 8.4 - 10.5 mg/dL   GFR calc non Af Amer >90 >90 mL/min   GFR calc Af Amer >90 >90 mL/min    Comment: (NOTE) The eGFR has been calculated using the CKD EPI equation. This calculation has not been validated in all clinical situations. eGFR's persistently <90 mL/min signify possible Chronic Kidney Disease.    Anion gap 15 5 - 15  I-Stat CG4 Lactic Acid, ED     Status: None   Collection Time: 07/10/14  9:06 AM  Result Value Ref Range   Lactic Acid, Venous 1.29 0.5 - 2.2 mmol/L    Radiology Reports: Dg Chest 2  View  07/10/2014   CLINICAL DATA:  53 year old male with 2 day history of cough and shortness of breath and 1 day history of fever. Medical history of multiple sclerosis which limits ability of patient's to raise arms for the lateral view.  EXAM: CHEST  2 VIEW  COMPARISON:  Prior chest x-ray and chest CT 03/02/2014  FINDINGS: Cardiac and mediastinal contours remain within normal limits. Inspiratory volumes are low but unchanged compared to prior imaging. No focal airspace consolidation. Interval resolution of airspace consolidation in the left mid lung and base compared to 03/02/2014. No pneumothorax or pleural effusion. Central bronchitic changes and mild interstitial prominence remain unchanged. No acute osseous abnormality.  IMPRESSION: No acute cardiopulmonary process.  Chronically low inspiratory volumes and pulmonary parenchymal disease.   Electronically Signed   By: Jacqulynn Cadet M.D.   On: 07/10/2014 08:18    Problem List  Principal Problem:   UTI (urinary tract infection) Active Problems:   Multiple sclerosis   Spastic quadriparesis   Neurogenic bladder   Sepsis   Assessment: this is of 53 year old Caucasian male with a history of MS who presents with the fever, chills, nausea and vomiting. His UA is abnormal. He was febrile and tachycardic when he presented to the ED. He has  urinary tract infection with sepsis. He has a suprapubic catheter due to history of bladder stones and neurogenic bladder. This is a complicated UTI.  Plan: #1 Urinary tract infection with sepsis: He has been given vancomycin and cefepime. However, I do not think he requires such broad-spectrum coverage. He'll be placed on ceftriaxone for now. Urine cultures will be followed up on. Blood cultures have also been obtained. We will get ultrasound of his renal system.  #2 Possible infected wound around the suprapubic catheter site: Antibiotic ointment will be prescribed.  #3 history of multiple sclerosis: He is  followed by Dr. Floyde Parkins with neurology. He gets Tysabri infusions and received one just a few days ago. He is experiencing contractures of his upper extremity. He also experiences a lot of spasms all over his body. He'll be continued on his home medication regimen.  #4 history of hypertension: We will hold his antihypertensive agent at least for now. This can be reevaluated in the morning.  #5 history of hypothyroidism: Continue with his home medications.  #6 history of anxiety and depression: Continue with his home medications.  #7 Mild dehydration and hypokalemia: He'll be given IV fluids with potassium. Repeat labs in the morning.   DVT Prophylaxis: Lovenox Code Status: this was discussed with the patient. He does not want indefinite life-support will but will be okay with temporary measures. So he is a Full Code Family Communication: discussed with the patient. No family at bedside.  Disposition Plan: admit to MedSurg   Further management decisions will depend on results of further testing and patient's response to treatment.   [facility]  [facility] Pager [phone]  If 7PM-7AM, please contact night-coverage [email or web address] Password [email or web address]  07/10/2014, 11:45 AM

## 2014-07-10 NOTE — Progress Notes (Signed)
ANTIBIOTIC CONSULT NOTE - INITIAL  Pharmacy Consult for vancomycin/cefepime Indication: rule out sepsis  Allergies  Allergen Reactions  . Penicillins Other (See Comments)    Unknown childhoood reaction (takes amoxicillian with reaction)    Patient Measurements:   Adjusted Body Weight:   Vital Signs: Temp: 101.9 F (38.8 C) (11/08 0747) Temp Source: Rectal (11/08 0747) BP: 124/81 mmHg (11/08 0724) Pulse Rate: 108 (11/08 0724) Intake/Output from previous day:   Intake/Output from this shift:    Labs: No results for input(s): WBC, HGB, PLT, LABCREA, CREATININE in the last 72 hours. Estimated Creatinine Clearance: 119.2 mL/min (by C-G formula based on Cr of 0.59). No results for input(s): VANCOTROUGH, VANCOPEAK, VANCORANDOM, GENTTROUGH, GENTPEAK, GENTRANDOM, TOBRATROUGH, TOBRAPEAK, TOBRARND, AMIKACINPEAK, AMIKACINTROU, AMIKACIN in the last 72 hours.   Microbiology: No results found for this or any previous visit (from the past 720 hour(s)).  Medical History: Past Medical History  Diagnosis Date  . Hypertension   . GERD (gastroesophageal reflux disease)   . Hypothyroidism   . Vitamin D deficiency   . Chronic back pain   . Gait disorder   . 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 Multiple sclerosis NEUROLOGIST-- [name]    dx 1993, JC virus negative 2/14---  CURRENTLY RECEIVING TYSABRI IV TREATMENT  . Pneumonia     dx  03-03-2014--  admitted for iv and oral antibiotics  . Urinary retention   . Neurogenic bladder   . Foley catheter in place   . Incontinence of urine   . Hyperlipidemia   . Bladder calculi   . Shortness of breath     secondary to fatique  . Chronic fatigue   . Neurogenic bowel     intermittant constipation and diarrhea  . Yeast infection     genital area   Assessment: 5 YOM presents to ED with fever, chills, and N/V.  He has h/o neurogenic bladder with indwelling catheter.  Starting broad spectrum antibiotics for possible sepsis, possible urinary  tract infection. History of PCN allergy w/ unknown rxn to amoxicillin when child - previously tolerated ceftriaxone.   11/8 >> vancomycin  >> 11/8 >>cefepiime  >>    Tmax: 101 WBCs: slightly elevated Renal:  SCr = 0.51 for Normalized CrCl >135ml/min . Suspect SCr may over-estimate renal function d/t chronic conditions (MS, spastic quadriparesis - only able to move LUE)  / blood: 11/8 urine: (UA c/w UTI)  Goal of Therapy:  Vancomycin trough level 15-20 mcg/ml  Plan:   Vancomycin 1gm IV q12h - dose on conservative side d/t chronic conditions that may over predict actual CrCl  Check vancomycin trough at steady state  Monitor renal function and culture data - f/u ability to narrow antibiotics  Cefepime 2gm VI x 1 then 1gm IV q8h  Juliette Alcide, PharmD, BCPS.   Pager: [phone]  07/10/2014,8:04 AM

## 2014-07-10 NOTE — ED Notes (Signed)
Pt in from home by ems. Fever chills since 0400 today. N/v x1. Dark urine last night. Foley in place. Foley changed 2 days ago. Temp 102 that he tried to take tylenol for but vomited shortly after taking it.

## 2014-07-10 NOTE — ED Notes (Addendum)
Delay in gaining IV access due to pt being a difficult stick. Ultrasound guided IV obtained with one set of blood cultures. Bolus and first abx started without getting second set of blood cultures. Informed Ward, MD. Sts to still obtain second set of cultures. RN at bedside attempting second ultrasound guided IV.

## 2014-07-10 NOTE — Plan of Care (Signed)
Problem: Phase I Progression Outcomes Goal: Pain controlled with appropriate interventions Outcome: Completed/Met Date Met:  07/10/14 Goal: Vital Signs stable- temperature less than 102 Outcome: Completed/Met Date Met:  07/10/14

## 2014-07-10 NOTE — ED Provider Notes (Signed)
TIME SEEN: 7:15 AM  CHIEF COMPLAINT: fever, vomiting, dark urine  HPI: Pt is a 53 y.o. M with history of hypertension, multiple sclerosis receiving Tysabri infusions who is wheelchair-bound, neurogenic bladder with a suprapubic catheter who presents to the emergency department with fevers, nausea and vomiting that started early this morning. He noticed dark urine last night. His Foley catheter was changed 2 days ago. Fever was as high as 102. Denies any cough. No rash. No headache, neck pain or neck stiffness. No chest pain or shortness of breath. No diarrhea that he is aware of. States he lives at home alone.  Reports that he does not have muchmovement of his right upper extremity, lower extremities but does have ability to move his left upper extremity.  PCP is Dr. Doristine Counter at [facility])  ROS: See HPI Constitutional:  fever  Eyes: no drainage  ENT: no runny nose   Cardiovascular:  no chest pain  Resp: no SOB  GI:  vomiting GU: no dysuria Integumentary: no rash  Allergy: no hives  Musculoskeletal: no leg swelling  Neurological: no slurred speech ROS otherwise negative  PAST MEDICAL HISTORY/PAST SURGICAL HISTORY:  Past Medical History  Diagnosis Date  . Hypertension   . GERD (gastroesophageal reflux disease)   . Hypothyroidism   . Vitamin D deficiency   . Chronic back pain   . Gait disorder   . 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 Multiple sclerosis NEUROLOGIST-- [name]    dx 1993, JC virus negative 2/14---  CURRENTLY RECEIVING TYSABRI IV TREATMENT  . Pneumonia     dx  03-03-2014--  admitted for iv and oral antibiotics  . Urinary retention   . Neurogenic bladder   . Foley catheter in place   . Incontinence of urine   . Hyperlipidemia   . Bladder calculi   . Shortness of breath     secondary to fatique  . Chronic fatigue   . Neurogenic bowel     intermittant constipation and diarrhea  . Yeast infection     genital area    MEDICATIONS:  Prior to  Admission medications   Medication Sig Start Date End Date Taking? Authorizing Provider  amitriptyline (ELAVIL) 100 MG tablet take 1 tablet by mouth at bedtime 01/25/14   York Spaniel, MD  baclofen (LIORESAL) 20 MG tablet take 2 tablets by mouth three times a day 06/21/14   York Spaniel, MD  clonazePAM Scarlette Calico) 1 MG tablet take 1 tablet by mouth twice a day 05/13/14   York Spaniel, MD  clotrimazole-betamethasone (LOTRISONE) cream Apply 1 application topically 2 (two) times daily as needed.    Historical Provider, MD  Cranberry-Vitamin C-Probiotic (AZO CRANBERRY PO) Take 1 capsule by mouth 2 (two) times daily.     Historical Provider, MD  dantrolene (DANTRIUM) 100 MG capsule take 1 capsule by mouth three times a day 04/04/14   York Spaniel, MD  diazepam (VALIUM) 10 MG tablet take 1 tablet by mouth AT NIGHT IF NEEDED 03/23/14   York Spaniel, MD  HYDROcodone-acetaminophen (NORCO/VICODIN) 5-325 MG per tablet Take 1-2 tablets by mouth every 6 (six) hours as needed. 05/13/14   York Spaniel, MD  ibuprofen (ADVIL,MOTRIN) 200 MG tablet Take 200 mg by mouth as needed (takes 3 tablets as needed).    Historical Provider, MD  levothyroxine (SYNTHROID, LEVOTHROID) 100 MCG tablet Take 100 mcg by mouth daily before breakfast.     Historical Provider, MD  lisinopril-hydrochlorothiazide (PRINZIDE,ZESTORETIC) 10-12.5 MG per  tablet Take 0.5 tablets by mouth every morning.     Historical Provider, MD  omeprazole (PRILOSEC) 20 MG capsule Take 20 mg by mouth every morning.     Historical Provider, MD  perphenazine (TRILAFON) 2 MG tablet take 1 tablet by mouth twice a day 05/31/14   York Spanielharles K Willis, MD  sertraline (ZOLOFT) 100 MG tablet Take 200 mg by mouth at bedtime.     Historical Provider, MD  terazosin (HYTRIN) 10 MG capsule Take 10 mg by mouth at bedtime.     Historical Provider, MD  tiZANidine (ZANAFLEX) 4 MG tablet take 1 tablet by mouth every 4 hours if needed 05/20/14   York Spanielharles K Willis, MD   traMADol (ULTRAM) 50 MG tablet Take 1 tablet (50 mg total) by mouth every 6 (six) hours as needed. 05/13/14   York Spanielharles K Willis, MD  Vitamin D, Ergocalciferol, (DRISDOL) 50000 UNITS CAPS Take 50,000 Units by mouth every 7 (seven) days. Friday only    Historical Provider, MD    ALLERGIES:  Allergies  Allergen Reactions  . Penicillins Other (See Comments)    Unknown childhoood reaction (takes amoxicillian with reaction)    SOCIAL HISTORY:  History  Substance Use Topics  . Smoking status: Former Smoker -- 2.00 packs/day for 8 years    Types: Cigarettes    Quit date: 06/17/1985  . Smokeless tobacco: Never Used  . Alcohol Use: No    FAMILY HISTORY: Family History  Problem Relation Age of Onset  . Stroke Father   . Alcoholism Brother     EXAM: BP 124/81 mmHg  Pulse 108  Temp(Src) 99.4 F (37.4 C) (Oral)  Resp 18  SpO2 97% CONSTITUTIONAL: Alert and oriented and responds appropriately to questions. Cachectic, chronically ill-appearing, in no distress HEAD: Normocephalic EYES: Conjunctivae clear, PERRL ENT: normal nose; no rhinorrhea; moist mucous membranes; pharynx without lesions noted NECK: Supple, no meningismus, no LAD  CARD: regular and tachycardic; S1 and S2 appreciated; no murmurs, no clicks, no rubs, no gallops RESP: Normal chest excursion without splinting or tachypnea; breath sounds clear and equal bilaterally; no wheezes, no rhonchi, no rales, no hypoxia ABD/GI: Normal bowel sounds; non-distended; soft, non-tender, no rebound, no guarding; suprapubic catheter in place with a minimal amount of yellow crusted discharge around the catheter BACK:  The back appears normal and is non-tender to palpation, there is no CVA tenderness EXT: no joint effusion, non-tender to palpation; no edema; normal capillary refill; no cyanosis    SKIN: Normal color for age and race; warm NEURO: atrophied bilateral lower extremities and right upper extremity, right upper extremity is  contracted, fingers and left upper extremity are contracted, cranial nerves II through XII intact PSYCH: The patient's mood and manner are appropriate. Grooming and personal hygiene are appropriate.  MEDICAL DECISION MAKING: Pt here with Fever, nausea and vomiting, dark urine. He is febrile, tachycardic. Septic workup initiated. We'll give broad-spectrum antibiotics, obtain labs, cultures, chest x-ray. Patient will need admission.  ED PROGRESS: Pt's labs show leukocytosis with left shift. He does have a nitrite positive urinary tract infection. Lactate normal. Chest x-ray clear. Cultures pending. He has received broad-spectrum antibiotics. Vital signs stable. Discussed with hospitalist for admission to medical bed, inpatient.      CRITICAL CARE Performed by: Raelyn NumberWARD, Aviona Martenson N   Total critical care time: 45 minutes  Critical care time was exclusive of separately billable procedures and treating other patients.  Critical care was necessary to treat or prevent imminent or life-threatening deterioration.  Critical  care was time spent personally by me on the following activities: development of treatment plan with patient and/or surrogate as well as nursing, discussions with consultants, evaluation of patient's response to treatment, examination of patient, obtaining history from patient or surrogate, ordering and performing treatments and interventions, ordering and review of laboratory studies, ordering and review of radiographic studies, pulse oximetry and re-evaluation of patient's condition.   Layla Maw Antowan Samford, DO 07/10/14 1120

## 2014-07-11 ENCOUNTER — Inpatient Hospital Stay (HOSPITAL_COMMUNITY): Payer: Medicare Other

## 2014-07-11 ENCOUNTER — Encounter (HOSPITAL_COMMUNITY): Payer: Self-pay | Admitting: Radiology

## 2014-07-11 LAB — CBC
HCT: 36 % — ABNORMAL LOW (ref 39.0–52.0)
Hemoglobin: 11.9 g/dL — ABNORMAL LOW (ref 13.0–17.0)
MCH: 30 pg (ref 26.0–34.0)
MCHC: 33.1 g/dL (ref 30.0–36.0)
MCV: 90.7 fL (ref 78.0–100.0)
Platelets: 118 10*3/uL — ABNORMAL LOW (ref 150–400)
RBC: 3.97 MIL/uL — ABNORMAL LOW (ref 4.22–5.81)
RDW: 14 % (ref 11.5–15.5)
WBC: 10.3 10*3/uL (ref 4.0–10.5)

## 2014-07-11 LAB — COMPREHENSIVE METABOLIC PANEL
ALT: 12 U/L (ref 0–53)
AST: 15 U/L (ref 0–37)
Albumin: 3.3 g/dL — ABNORMAL LOW (ref 3.5–5.2)
Alkaline Phosphatase: 74 U/L (ref 39–117)
Anion gap: 14 (ref 5–15)
BUN: 8 mg/dL (ref 6–23)
CO2: 25 mEq/L (ref 19–32)
Calcium: 9.3 mg/dL (ref 8.4–10.5)
Chloride: 100 mEq/L (ref 96–112)
Creatinine, Ser: 0.54 mg/dL (ref 0.50–1.35)
GFR calc Af Amer: >90 mL/min (ref >90)
GFR calc non Af Amer: >90 mL/min (ref >90)
Glucose, Bld: 99 mg/dL (ref 70–99)
Potassium: 4 mEq/L (ref 3.7–5.3)
Sodium: 139 mEq/L (ref 137–147)
Total Bilirubin: 0.4 mg/dL (ref 0.3–1.2)
Total Protein: 6.4 g/dL (ref 6.0–8.3)

## 2014-07-11 MED ORDER — IOHEXOL 300 MG/ML  SOLN
25.0000 mL | INTRAMUSCULAR | Status: AC
  Administered 2014-07-11 (×2): 25 mL via ORAL

## 2014-07-11 MED ORDER — IOHEXOL 300 MG/ML  SOLN
100.0000 mL | Freq: Once | INTRAMUSCULAR | Status: AC | PRN
  Administered 2014-07-11: 100 mL via INTRAVENOUS

## 2014-07-11 NOTE — Progress Notes (Signed)
[facility] PROGRESS NOTE  ATZIN BUCHTA ZOX:[id number] DOB: Sep 24, 1960 DOA: 07/10/2014  PCP: [name], MD  Brief HPI: Samuel [name] is a 53 y.o. 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 He also has a history of hypertension and hypothyroidism. He was in his usual state of health till about 2-3 days prior to admission when he had his suprapubic catheter replaced. He had been experiencing some bladder spasms and incontinence as a result of which he started using a condom catheter since Saturday. And then he started noticing that his urine was turning dark. Denied any blood in the urine. He woke up at about 4:30 AM on day of admission with chills and sweating episodes. He had a fever of 102F. He had 3 episodes of bilious, nonbloody vomiting. He subsequently decided to come into the hospital. He was diagnosed with a UTI.  Past medical history:  Past Medical History  Diagnosis Date  . Hypertension   . GERD (gastroesophageal reflux disease)   . Hypothyroidism   . Vitamin D deficiency   . Chronic back pain   . Gait disorder   . 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 Multiple sclerosis NEUROLOGIST-- [name]    dx 1993, JC virus negative 2/14---  CURRENTLY RECEIVING TYSABRI IV TREATMENT  . Pneumonia     dx  03-03-2014--  admitted for iv and oral antibiotics  . Urinary retention   . Neurogenic bladder   . Foley catheter in place   . Incontinence of urine   . Hyperlipidemia   . Bladder calculi   . Shortness of breath     secondary to fatique  . Chronic fatigue   . Neurogenic bowel     intermittant constipation and diarrhea  . Yeast infection     genital area    Consultants: None  Procedures: None  Antibiotics: Ceftriaxone 11/8-->  Subjective: Patient had an episode of vomiting yesterday. None since then. Denies any  abdominal pain today.  Objective: Vital Signs  Filed Vitals:   07/10/14 1222 07/10/14 1310 07/10/14 2050 07/11/14 0504  BP: 115/82 130/79 143/72 128/69  Pulse: 104 109 107 100  Temp: 99.2 F (37.3 C) 99.7 F (37.6 C) 99.1 F (37.3 C) 98.2 F (36.8 C)  TempSrc: Rectal Axillary Oral Oral  Resp: 18 18 20 16   Height:  6\' 1"  (1.854 m)    SpO2: 95% 95% 95% 98%    Intake/Output Summary (Last 24 hours) at 07/11/14 1052 Last data filed at 07/11/14 0600  Gross per 24 hour  Intake 1266.25 ml  Output   1500 ml  Net -233.75 ml   Filed Weights    General appearance: alert, cooperative, appears stated age and no distress Resp: clear to auscultation bilaterally Cardio: regular rate and rhythm, S1, S2 normal, no murmur, click, rub or gallop GI: soft, tender around suprapubic catheter site. no rebound rigidity or guarding. no masses or organomegaly. BS present. Extremities: chronic skin changes and mild edema which are chronic per patient. Neurologic: Spastic quadriparesis due to MS> Able to move left arm more than right.  Lab Results:  Basic Metabolic Panel:  Recent Labs Lab 07/10/14 0852 07/11/14 0700  NA 135* 139  K 3.4* 4.0  CL 95* 100  CO2 25 25  GLUCOSE 121* 99  BUN 10 8  CREATININE 0.51 0.54  CALCIUM 9.7 9.3   Liver Function Tests:  Recent Labs Lab 07/11/14 0700  AST 15  ALT 12  ALKPHOS 74  BILITOT 0.4  PROT 6.4  ALBUMIN 3.3*   CBC:  Recent Labs Lab 07/10/14 0852 07/11/14 0700  WBC 12.5* 10.3  NEUTROABS 9.9*  --   HGB 13.6 11.9*  HCT 40.7 36.0*  MCV 89.5 90.7  PLT 180 118*     Recent Results (from the past 240 hour(s))  Blood culture (routine x 2)     Status: None (Preliminary result)   Collection Time: 07/10/14  8:52 AM  Result Value Ref Range Status   Specimen Description BLOOD LEFT ARM  Final   Special Requests BOTTLES DRAWN AEROBIC AND ANAEROBIC [facility] EACH  Final   Culture  Setup Time   Final    07/10/2014 18:48 Performed at [facility]    Culture   Final           BLOOD CULTURE RECEIVED NO GROWTH TO DATE CULTURE WILL BE HELD FOR 5 DAYS BEFORE ISSUING A FINAL NEGATIVE REPORT Performed at [facility]    Report Status PENDING  Incomplete  Blood culture (routine x 2)     Status: None (Preliminary result)   Collection Time: 07/10/14 10:40 AM  Result Value Ref Range Status   Specimen Description BLOOD LEFT ANTECUBITAL  Final   Special Requests BOTTLES DRAWN AEROBIC AND ANAEROBIC 5CC EACH  Final   Culture  Setup Time   Final    07/10/2014 18:48 Performed at [facility]    Culture   Final           BLOOD CULTURE RECEIVED NO GROWTH TO DATE CULTURE WILL BE HELD FOR 5 DAYS BEFORE ISSUING A FINAL NEGATIVE REPORT Performed at [facility]    Report Status PENDING  Incomplete      Studies/Results: Dg Chest 2 View  07/10/2014   CLINICAL DATA:  53 year old male with 2 day history of cough and shortness of breath and 1 day history of fever. Medical history of multiple sclerosis which limits ability of patient's to raise arms for the lateral view.  EXAM: CHEST  2 VIEW  COMPARISON:  Prior chest x-ray and chest CT 03/02/2014  FINDINGS: Cardiac and mediastinal contours remain within normal limits. Inspiratory volumes are low but unchanged compared to prior imaging. No focal airspace consolidation. Interval resolution of airspace consolidation in the left mid lung and base compared to 03/02/2014. No pneumothorax or pleural effusion. Central bronchitic changes and mild interstitial prominence remain unchanged. No acute osseous abnormality.  IMPRESSION: No acute cardiopulmonary process.  Chronically low inspiratory volumes and pulmonary parenchymal disease.   Electronically Signed   By: Malachy Moan M.D.   On: 07/10/2014 08:18    Medications:  Scheduled: . amitriptyline  100 mg Oral QHS  . baclofen  40 mg Oral TID  . cefTRIAXone (ROCEPHIN)  IV  1 g Intravenous Q24H  . clonazePAM  1 mg Oral BID  .  dantrolene  100 mg Oral TID  . docusate sodium  100 mg Oral TID  . enoxaparin (LOVENOX) injection  40 mg Subcutaneous Q24H  . levothyroxine  100 mcg Oral QAC breakfast  . mirabegron ER  50 mg Oral Daily  . neomycin-bacitracin-polymyxin   Topical TID  . pantoprazole  40 mg Oral Daily  . perphenazine  2 mg Oral BID  . sertraline  200 mg Oral QHS  . tiZANidine  8 mg Oral TID   Continuous: . 0.9 % NaCl with KCl 20  mEq / L 75 mL/hr at 07/11/14 0430   ZOX:[id number]:acetaminophen **OR** acetaminophen, diazepam, morphine injection, ondansetron **OR** ondansetron (ZOFRAN) IV, oxyCODONE  Assessment/Plan:  Principal Problem:   UTI (urinary tract infection) Active Problems:   Multiple sclerosis   Spastic quadriparesis   Neurogenic bladder   Sepsis    Complicated Urinary tract infection with sepsis Remains stable. Urine culture is pending. Has suprapubic tenderness and had some pus drainage from that site. Will proceed with CT instead of US. Continue ceftriaxone.   Possible infected wound around the suprapubic catheter site Continue Antibiotic ointment.  History of multiple sclerosis He is followed by Dr. Lesia SagoKeith Willis with neurology. He gets Tysabri infusions every few weeks and received one just a few days ago. He is experiencing contractures of his upper extremity. He also experiences a lot of spasms all over his body. He'll be continued on his home medication regimen. He is bedbound.  History of hypertension We are holding his antihypertensive agents for now. Will continue to monitor.   History of hypothyroidism Continue with his home medications.  History of anxiety and depression Continue with his home medications.  Mild dehydration and hypokalemia Improved with IV fluids. Continue for another 24 hrs.    DVT Prophylaxis: Lovenox Code Status: Full Code Family Communication: discussed with the patient. No family at bedside.  Disposition Plan: Not ready for discharge. Comes from  home.      LOS: 1 day   North Shore Endoscopy CenterKRISHNAN,Jill Stopka  [facility] Pager [phone] 07/11/2014, 10:52 AM  If 8PM-8AM, please contact night-coverage at [email or web address], password [email or web address]

## 2014-07-12 LAB — BASIC METABOLIC PANEL
Anion gap: 13 (ref 5–15)
BUN: 5 mg/dL — ABNORMAL LOW (ref 6–23)
CO2: 23 mEq/L (ref 19–32)
Calcium: 8.9 mg/dL (ref 8.4–10.5)
Chloride: 100 mEq/L (ref 96–112)
Creatinine, Ser: 0.47 mg/dL — ABNORMAL LOW (ref 0.50–1.35)
GFR calc Af Amer: >90 mL/min (ref >90)
GFR calc non Af Amer: >90 mL/min (ref >90)
Glucose, Bld: 101 mg/dL — ABNORMAL HIGH (ref 70–99)
Potassium: 3.3 mEq/L — ABNORMAL LOW (ref 3.7–5.3)
Sodium: 136 mEq/L — ABNORMAL LOW (ref 137–147)

## 2014-07-12 LAB — CBC
HCT: 30.5 % — ABNORMAL LOW (ref 39.0–52.0)
Hemoglobin: 10.4 g/dL — ABNORMAL LOW (ref 13.0–17.0)
MCH: 30.1 pg (ref 26.0–34.0)
MCHC: 34.1 g/dL (ref 30.0–36.0)
MCV: 88.2 fL (ref 78.0–100.0)
Platelets: 122 10*3/uL — ABNORMAL LOW (ref 150–400)
RBC: 3.46 MIL/uL — ABNORMAL LOW (ref 4.22–5.81)
RDW: 13.9 % (ref 11.5–15.5)
WBC: 5.8 10*3/uL (ref 4.0–10.5)

## 2014-07-12 MED ORDER — POTASSIUM CHLORIDE CRYS ER 20 MEQ PO TBCR
40.0000 meq | EXTENDED_RELEASE_TABLET | Freq: Once | ORAL | Status: AC
  Administered 2014-07-12: 40 meq via ORAL
  Filled 2014-07-12 (×2): qty 2

## 2014-07-12 NOTE — Care Management Note (Signed)
CARE MANAGEMENT NOTE 07/12/2014  Patient:  Samuel [name], Samuel [name]   Account Number:  [id number]  Date Initiated:  07/12/2014  Documentation initiated by:  Marney Doctor  Subjective/Objective Assessment:   53 yo admitted with UTI. Hx of Hypertension      . GERD (gastroesophageal reflux disease)      . Hypothyroidism      . Vitamin D deficiency      . Chronic back pain      . Gait disorder      . Spastic quadriparesis     . Multiple sclerosis       Action/Plan:   From home alone.  Has private caregiver and also receives help from mother, brother and sister that live nearby.   Anticipated DC Date:  07/15/2014   Anticipated DC Plan:  [place]  CM consult      Choice offered to / List presented to:             Status of service:  In process, will continue to follow Medicare Important Message given?   (If response is "NO", the following Medicare IM given date fields will be blank) Date Medicare IM given:   Medicare IM given by:   Date Additional Medicare IM given:   Additional Medicare IM given by:    Discharge Disposition:    Per UR Regulation:  Reviewed for med. necessity/level of care/duration of stay  If discussed at [place] of Stay Meetings, dates discussed:    Comments:  07/12/14  Marney Doctor RN,BSN,NCM [phone] Met with pt and mom about DC plan.  Pt states he has everything he needs at home.  He states he has a ceiling sling to help him get to the bathroom. His family is very supportive and his caregiver is with him several hours every day.  CM will continue to follow for DC needs.

## 2014-07-12 NOTE — Consult Note (Signed)
Reason for Consult: Compex UTI, Uroltihiasis, Neurogenic Bladder  Referring Physician: Sandi Mariscal MD  Samuel [name] is an 53 y.o. male.   HPI:  1 - Compex UTI - Pt currently admitted to hospitalist service for fevers, malaise, bacteruria concerning for complex UTI. CT this admission without hydro, fluid collecitons, or obstructing stones. SPT in good position. Most recent positve CX at our office with Klebsiella sens to Rocephin.   2 -  Urolithiasis - Small dependant calcified debris in uriary bladder incicental on CT this admission. No large stones or upper tract stones.   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 Does get periodic urine per urethra with spasms. On baclofen and tizanadine for this and other spastic muscle issues. Was previosly on vesicare, myrbetriq, but w/o sig change.   Today Samuel [name] is seen in consultation for above. He usually follows with Dr. Risa [name] in our practice.  Past Medical History  Diagnosis Date  . Hypertension   . GERD (gastroesophageal reflux disease)   . Hypothyroidism   . Vitamin D deficiency   . Chronic back pain   . Gait disorder   . 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 Multiple sclerosis NEUROLOGIST-- DR Samuel [name]    dx 1993, JC virus negative 2/14---  CURRENTLY RECEIVING TYSABRI IV TREATMENT  . Pneumonia     dx  03-03-2014--  admitted for iv and oral antibiotics  . Urinary retention   . Neurogenic bladder   . Foley catheter in place   . Incontinence of urine   . Hyperlipidemia   . Bladder calculi   . Shortness of breath     secondary to fatique  . Chronic fatigue   . Neurogenic bowel     intermittant constipation and diarrhea  . Yeast infection     genital area    Past Surgical History  Procedure Laterality Date  . Appendectomy  age 36  . Nasal septum surgery  1996  . Negative sleep study  1996  . Transthoracic  echocardiogram  02-07-2004    MILD LVH/  EF 55-65%  . Insertion of suprapubic catheter N/A 03/21/2014    Procedure: INSERTION OF SUPRAPUBIC CATHETER;  Surgeon: Samuel Amass, MD;  Location: [facility];  Service: Urology;  Laterality: N/A;  . Cystoscopy N/A 03/21/2014    Procedure: CYSTOSCOPY TREATMENT OF BLADDER CALCULI;  Surgeon: Samuel Amass, MD;  Location: [facility];  Service: Urology;  Laterality: N/A;    Family History  Problem Relation Age of Onset  . Stroke Father   . Alcoholism Brother     Social History:  reports that he quit smoking about 29 years ago. His smoking use included Cigarettes. He has a 16 pack-year smoking history. He has never used smokeless tobacco. He reports that he does not drink alcohol or use illicit drugs.  Allergies:  Allergies  Allergen Reactions  . Penicillins Other (See Comments)    Unknown childhoood reaction (takes amoxicillian with reaction)    Medications: I have reviewed the patient's current medications.  Results for orders placed or performed during the hospital encounter of 07/10/14 (from the past 48 hour(s))  Comprehensive metabolic panel     Status: Abnormal   Collection Time: 07/11/14  7:00 AM  Result Value Ref Range   Sodium 139 137 - 147 mEq/L   Potassium 4.0 3.7 - 5.3 mEq/L   Chloride 100 96 -  112 mEq/L   CO2 25 19 - 32 mEq/L   Glucose, Bld 99 70 - 99 mg/dL   BUN 8 6 - 23 mg/dL   Creatinine, Ser 0.54 0.50 - 1.35 mg/dL   Calcium 9.3 8.4 - 10.5 mg/dL   Total Protein 6.4 6.0 - 8.3 g/dL   Albumin 3.3 (L) 3.5 - 5.2 g/dL   AST 15 0 - 37 U/L   ALT 12 0 - 53 U/L   Alkaline Phosphatase 74 39 - 117 U/L   Total Bilirubin 0.4 0.3 - 1.2 mg/dL   GFR calc non Af Amer >90 >90 mL/min   GFR calc Af Amer >90 >90 mL/min    Comment: (NOTE) The eGFR has been calculated using the CKD EPI equation. This calculation has not been validated in all clinical situations. eGFR's persistently <90 mL/min signify possible  Chronic Kidney Disease.    Anion gap 14 5 - 15  CBC     Status: Abnormal   Collection Time: 07/11/14  7:00 AM  Result Value Ref Range   WBC 10.3 4.0 - 10.5 K/uL   RBC 3.97 (L) 4.22 - 5.81 MIL/uL   Hemoglobin 11.9 (L) 13.0 - 17.0 g/dL   HCT 36.0 (L) 39.0 - 52.0 %   MCV 90.7 78.0 - 100.0 fL   MCH 30.0 26.0 - 34.0 pg   MCHC 33.1 30.0 - 36.0 g/dL   RDW 14.0 11.5 - 15.5 %   Platelets 118 (L) 150 - 400 K/uL    Comment: SPECIMEN CHECKED FOR CLOTS REPEATED TO VERIFY PLATELET COUNT CONFIRMED BY SMEAR DELTA CHECK NOTED   CBC     Status: Abnormal   Collection Time: 07/12/14  3:47 AM  Result Value Ref Range   WBC 5.8 4.0 - 10.5 K/uL   RBC 3.46 (L) 4.22 - 5.81 MIL/uL   Hemoglobin 10.4 (L) 13.0 - 17.0 g/dL   HCT 30.5 (L) 39.0 - 52.0 %   MCV 88.2 78.0 - 100.0 fL   MCH 30.1 26.0 - 34.0 pg   MCHC 34.1 30.0 - 36.0 g/dL   RDW 13.9 11.5 - 15.5 %   Platelets 122 (L) 150 - 400 K/uL  Basic metabolic panel     Status: Abnormal   Collection Time: 07/12/14  3:47 AM  Result Value Ref Range   Sodium 136 (L) 137 - 147 mEq/L   Potassium 3.3 (L) 3.7 - 5.3 mEq/L    Comment: DELTA CHECK NOTED REPEATED TO VERIFY    Chloride 100 96 - 112 mEq/L   CO2 23 19 - 32 mEq/L   Glucose, Bld 101 (H) 70 - 99 mg/dL   BUN 5 (L) 6 - 23 mg/dL   Creatinine, Ser 0.47 (L) 0.50 - 1.35 mg/dL   Calcium 8.9 8.4 - 10.5 mg/dL   GFR calc non Af Amer >90 >90 mL/min   GFR calc Af Amer >90 >90 mL/min    Comment: (NOTE) The eGFR has been calculated using the CKD EPI equation. This calculation has not been validated in all clinical situations. eGFR's persistently <90 mL/min signify possible Chronic Kidney Disease.    Anion gap 13 5 - 15    Ct Abdomen Pelvis W Wo Contrast  07/11/2014   CLINICAL DATA:  Generalized abdominal pain. Neurogenic bladder. Urolithiasis. Urinary tract infection. Vomiting.  EXAM: CT ABDOMEN AND PELVIS WITHOUT AND WITH CONTRAST  TECHNIQUE: Multidetector CT imaging of the abdomen and pelvis was  performed following the standard protocol before and following the bolus administration of  intravenous contrast.  CONTRAST:  178m OMNIPAQUE IOHEXOL 300 MG/ML  SOLN  COMPARISON:  None.  FINDINGS: Lower chest:  Dependent bibasilar atelectasis or scarring noted.  Hepatobiliary: No masses or other significant abnormality identified. Gallbladder is unremarkable.  Pancreas: No cystic or solid masses identified. No peripancreatic inflammatory changes or fluid collections.  Spleen:  Within normal limits in size and appearance.  Adrenal Glands:  No masses identified.  Kidneys/Urinary Tract: No evidence of nephrolithiasis or hydronephrosis. No complex cystic or solid renal masses identified. No masses seen involving lower urinary tract.  Several small less than 1 cm calculi are seen the urinary bladder. Suprapubic bladder catheter is seen in place. Diffuse bladder wall thickening is seen, consistent with cystitis. No evidence of focal bladder mass.  Stomach/Bowel/Peritoneum: No evidence of wall thickening, mass, or obstruction.  Vascular/Lymphatic: No pathologically enlarged lymph nodes identified. No other significant abnormality identified.  Reproductive:  No mass or other significant abnormality identified.  Other:  None.  Musculoskeletal:  No suspicious bone lesions identified.  IMPRESSION: Diffuse bladder wall thickening again seen, consistent with chronic cystitis.  Several small sub-cm calculi within the urinary bladder. No evidence of ureteral calculi or hydronephrosis.   Electronically Signed   By: JEarle GellM.D.   On: 07/11/2014 14:36    Review of Systems  Constitutional: Positive for fever and malaise/fatigue.  HENT: Negative.   Eyes: Negative.   Respiratory: Negative.   Cardiovascular: Negative.   Gastrointestinal: Positive for nausea.  Genitourinary: Negative.  Negative for hematuria and flank pain.  Musculoskeletal: Negative.   Skin: Negative.   Neurological: Negative.   Endo/Heme/Allergies:  Negative.   Psychiatric/Behavioral: Negative.    Blood pressure 120/76, pulse 87, temperature 97.7 F (36.5 C), temperature source Oral, resp. rate 20, height _0  (1.854 m), weight 94.4 kg (208 lb 1.8 oz), SpO2 96 %. Physical Exam  Constitutional:  Stigmata of advanced MS, very pleasant. Mother also in hospital room.   HENT:  Head: Normocephalic.  Eyes: Pupils are equal, round, and reactive to light.  Neck: Normal range of motion.  Cardiovascular: Normal rate.   Respiratory: Effort normal.  GI: Soft.  Genitourinary:  SPT c/d/i with clear urine  Musculoskeletal:  Stigmata of advanced MS  Neurological: He is alert.  Skin: Skin is warm. No erythema.  Psychiatric: He has a normal mood and affect. His behavior is normal.    Assessment/Plan:  1 - Compex UTI - Agree with current ABX based on prior recent cultures at our office. No indication for any procedural intervention as no hydro or drainable collecitons.   2 -  Urolithiasis - very small volume dependant bladder stone not huge concern in inpateint setting as all passable / flushable size per urethra.   3 - Neurogenic Bladder - up to date with recent SPT tube change that appears in good position, continue.  4 - Pt has GU follow-up arranged and pending, please call anytime with questions.     Quintella Mura 07/12/2014, 12:01 PM

## 2014-07-12 NOTE — Clinical Documentation Improvement (Signed)
    MD's, NP's, and PA's   Per notes patient recently had "suprapubic catheter replaced"   now presents with "Sepsis due to UTI"  If the "suprapubic catheter" reinsertion is a possible cause for the UTI and Sepsis please document in progress notes and d/c summary. Thank you   . Body system   Urinary  . Type of device  Suprapubic catheter   . Specific complication   Infection due to suprapubic catheter   Infection of skin surrounding suprapubic catheter    Hemorrhage   Pain   Other       Thank  You,    Raymond Gurney, RN, BSN,CCDS [facility] Department [phone]

## 2014-07-12 NOTE — Plan of Care (Signed)
Problem: Phase I Progression Outcomes Goal: OOB as tolerated unless otherwise ordered Outcome: Not Applicable Date Met:  33/12/50 Goal: Voiding-avoid urinary catheter unless indicated Outcome: Not Applicable Date Met:  87/19/94 Goal: Adequate I & O Outcome: Progressing

## 2014-07-12 NOTE — Progress Notes (Signed)
[facility] PROGRESS NOTE  Samuel DawsonGrant J Murren JWJ:[id number] DOB: [birth year] DOA: 07/10/2014  PCP: Delorse LekBURNETT,BRENT A, MD  Brief HPI: Samuel [name] is a 53 y.o. 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 He also has a history of hypertension and hypothyroidism. He was in his usual state of health till about 2-3 days prior to admission when he had his suprapubic catheter replaced. He had been experiencing some bladder spasms and incontinence as a result of which he started using a condom catheter since Saturday. And then he started noticing that his urine was turning dark. Denied any blood in the urine. He woke up at about 4:30 AM on day of admission with chills and sweating episodes. He had a fever of 102F. He had 3 episodes of bilious, nonbloody vomiting. He subsequently decided to come into the hospital. He was diagnosed with a UTI.  Past medical history:  Past Medical History  Diagnosis Date  . Hypertension   . GERD (gastroesophageal reflux disease)   . Hypothyroidism   . Vitamin D deficiency   . Chronic back pain   . Gait disorder   . 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 Multiple sclerosis NEUROLOGIST-- DR Anne HahnWILLIS    dx 1993, JC virus negative 2/14---  CURRENTLY RECEIVING TYSABRI IV TREATMENT  . Pneumonia     dx  03-03-2014--  admitted for iv and oral antibiotics  . Urinary retention   . Neurogenic bladder   . Foley catheter in place   . Incontinence of urine   . Hyperlipidemia   . Bladder calculi   . Shortness of breath     secondary to fatique  . Chronic fatigue   . Neurogenic bowel     intermittant constipation and diarrhea  . Yeast infection     genital area    Consultants: Discussed with Dr. Berneice HeinrichManny with Urology  Procedures: None  Antibiotics: Ceftriaxone 11/8-->  Subjective: Patient had nausea yesterday but no vomiting.  Feels better. Denies any abdominal pain today.  Objective: Vital Signs  Filed Vitals:   07/11/14 0504 07/11/14 1347 07/11/14 2126 07/12/14 0730  BP: 128/69 112/67 127/82 120/76  Pulse: 100 97 92 87  Temp: 98.2 F (36.8 C) 98.3 F (36.8 C) 98.9 F (37.2 C) 97.7 F (36.5 C)  TempSrc: Oral Oral Oral Oral  Resp: 16 18 20 20   Height:      Weight:    94.4 kg (208 lb 1.8 oz)  SpO2: 98% 97% 99% 96%    Intake/Output Summary (Last 24 hours) at 07/12/14 [id number] Last data filed at 07/12/14 0600  Gross per 24 hour  Intake   2330 ml  Output   1525 ml  Net    805 ml   Filed Weights   07/12/14 0730  Weight: 94.4 kg (208 lb 1.8 oz)    General appearance: alert, cooperative, appears stated age and no distress Resp: clear to auscultation bilaterally Cardio: regular rate and rhythm, S1, S2 normal, no murmur, click, rub or gallop GI: soft, tender around suprapubic catheter site. no rebound rigidity or guarding. no masses or organomegaly. BS present. Extremities: chronic skin changes and mild edema which are chronic per patient. Neurologic: Spastic quadriparesis due to MS> Able to move left arm more than right. Skin: Improved skin around suprapubic catheter site. Very minimal yellow drainage.  Lab Results:  Basic Metabolic Panel:  Recent Labs Lab 07/10/14 [id number]  07/11/14 0700 07/12/14 0347  NA 135* 139 136*  K 3.4* 4.0 3.3*  CL 95* 100 100  CO2 25 25 23   GLUCOSE 121* 99 101*  BUN 10 8 5*  CREATININE 0.51 0.54 0.47*  CALCIUM 9.7 9.3 8.9   Liver Function Tests:  Recent Labs Lab 07/11/14 0700  AST 15  ALT 12  ALKPHOS 74  BILITOT 0.4  PROT 6.4  ALBUMIN 3.3*   CBC:  Recent Labs Lab 07/10/14 0852 07/11/14 0700 07/12/14 0347  WBC 12.5* 10.3 5.8  NEUTROABS 9.9*  --   --   HGB 13.6 11.9* 10.4*  HCT 40.7 36.0* 30.5*  MCV 89.5 90.7 88.2  PLT 180 118* 122*     Recent Results (from the past 240 hour(s))  Urine culture     Status: None (Preliminary result)   Collection  Time: 07/10/14  7:35 AM  Result Value Ref Range Status   Specimen Description URINE, CATHETERIZED  Final   Special Requests NONE  Final   Culture  Setup Time   Final    07/10/2014 19:03 Performed at [facility] Count PENDING  Incomplete   Culture   Final    Culture reincubated for better growth Performed at [facility]    Report Status PENDING  Incomplete  Blood culture (routine x 2)     Status: None (Preliminary result)   Collection Time: 07/10/14  8:52 AM  Result Value Ref Range Status   Specimen Description BLOOD LEFT ARM  Final   Special Requests BOTTLES DRAWN AEROBIC AND ANAEROBIC [facility] EACH  Final   Culture  Setup Time   Final    07/10/2014 18:48 Performed at [facility]    Culture   Final           BLOOD CULTURE RECEIVED NO GROWTH TO DATE CULTURE WILL BE HELD FOR 5 DAYS BEFORE ISSUING A FINAL NEGATIVE REPORT Performed at [facility]    Report Status PENDING  Incomplete  Blood culture (routine x 2)     Status: None (Preliminary result)   Collection Time: 07/10/14 10:40 AM  Result Value Ref Range Status   Specimen Description BLOOD LEFT ANTECUBITAL  Final   Special Requests BOTTLES DRAWN AEROBIC AND ANAEROBIC 5CC EACH  Final   Culture  Setup Time   Final    07/10/2014 18:48 Performed at [facility]    Culture   Final           BLOOD CULTURE RECEIVED NO GROWTH TO DATE CULTURE WILL BE HELD FOR 5 DAYS BEFORE ISSUING A FINAL NEGATIVE REPORT Performed at [facility]    Report Status PENDING  Incomplete      Studies/Results: Ct Abdomen Pelvis W Wo Contrast  07/11/2014   CLINICAL DATA:  Generalized abdominal pain. Neurogenic bladder. Urolithiasis. Urinary tract infection. Vomiting.  EXAM: CT ABDOMEN AND PELVIS WITHOUT AND WITH CONTRAST  TECHNIQUE: Multidetector CT imaging of the abdomen and pelvis was performed following the standard protocol before and following the bolus administration of intravenous  contrast.  CONTRAST:  OMNIPAQUE IOHEXOL 300 MG/ML  SOLN  COMPARISON:  None.  FINDINGS: Lower chest:  Dependent bibasilar atelectasis or scarring noted.  Hepatobiliary: No masses or other significant abnormality identified. Gallbladder is unremarkable.  Pancreas: No cystic or solid masses identified. No peripancreatic inflammatory changes or fluid collections.  Spleen:  Within normal limits in size and appearance.  Adrenal Glands:  No masses identified.  Kidneys/Urinary Tract:  No evidence of nephrolithiasis or hydronephrosis. No complex cystic or solid renal masses identified. No masses seen involving lower urinary tract.  Several small less than 1 cm calculi are seen the urinary bladder. Suprapubic bladder catheter is seen in place. Diffuse bladder wall thickening is seen, consistent with cystitis. No evidence of focal bladder mass.  Stomach/Bowel/Peritoneum: No evidence of wall thickening, mass, or obstruction.  Vascular/Lymphatic: No pathologically enlarged lymph nodes identified. No other significant abnormality identified.  Reproductive:  No mass or other significant abnormality identified.  Other:  None.  Musculoskeletal:  No suspicious bone lesions identified.  IMPRESSION: Diffuse bladder wall thickening again seen, consistent with chronic cystitis.  Several small sub-cm calculi within the urinary bladder. No evidence of ureteral calculi or hydronephrosis.   Electronically Signed   By: Myles Rosenthal M.D.   On: 07/11/2014 14:36    Medications:  Scheduled: . amitriptyline  100 mg Oral QHS  . baclofen  40 mg Oral TID  . cefTRIAXone (ROCEPHIN)  IV  1 g Intravenous Q24H  . clonazePAM  1 mg Oral BID  . dantrolene  100 mg Oral TID  . docusate sodium  100 mg Oral TID  . enoxaparin (LOVENOX) injection  40 mg Subcutaneous Q24H  . levothyroxine  100 mcg Oral QAC breakfast  . mirabegron ER  50 mg Oral Daily  . neomycin-bacitracin-polymyxin   Topical TID  . pantoprazole  40 mg Oral Daily  . perphenazine   2 mg Oral BID  . sertraline  200 mg Oral QHS  . tiZANidine  8 mg Oral TID   Continuous: . 0.9 % NaCl with KCl 20 mEq / L 75 mL/hr at 07/12/14 5750   [id number] **OR** acetaminophen, diazepam, morphine injection, ondansetron **OR** ondansetron (ZOFRAN) IV, oxyCODONE  Assessment/Plan:  Principal Problem:   UTI (urinary tract infection) Active Problems:   Multiple sclerosis   Spastic quadriparesis   Neurogenic bladder   Sepsis    Complicated Urinary tract infection with sepsis Remains stable. Urine culture is pending. Has suprapubic tenderness and had some pus drainage from that site which is better. Unclear if replacement of suprapubic catheter played a role. CT abdomen as above. Discussed with Dr. Berneice Heinrich with Urology. He recommends continuing current management. Continue Ceftriaxone. Await culture data.  Possible infected wound around the suprapubic catheter site Improved. Continue Antibiotic ointment.  History of multiple sclerosis He is followed by Dr. Lesia Sago with neurology. He gets Tysabri infusions every few weeks and received one just a few days ago. He is experiencing contractures of his upper extremity. He also experiences a lot of spasms all over his body. He'll be continued on his home medication regimen. He is bedbound.  History of hypertension We are holding his antihypertensive agents for now. Will continue to monitor.   History of hypothyroidism Continue with his home medications.  History of anxiety and depression Continue with his home medications.  Mild dehydration and hypokalemia Improved with IV fluids. Will decrease rate. Replace K.    DVT Prophylaxis: Lovenox Code Status: Full Code Family Communication: discussed with the patient. No family at bedside.  Disposition Plan: Not ready for discharge. Comes from home.      LOS: 2 days   [facility]  [facility] Pager [phone] 07/12/2014, 8:26 AM  If 8PM-8AM, please contact  night-coverage at [email or web address], password [facility]

## 2014-07-13 DIAGNOSIS — N319 Neuromuscular dysfunction of bladder, unspecified: Secondary | ICD-10-CM

## 2014-07-13 LAB — CBC
HCT: 29.8 % — ABNORMAL LOW (ref 39.0–52.0)
Hemoglobin: 10.1 g/dL — ABNORMAL LOW (ref 13.0–17.0)
MCH: 29.6 pg (ref 26.0–34.0)
MCHC: 33.9 g/dL (ref 30.0–36.0)
MCV: 87.4 fL (ref 78.0–100.0)
Platelets: 134 10*3/uL — ABNORMAL LOW (ref 150–400)
RBC: 3.41 MIL/uL — ABNORMAL LOW (ref 4.22–5.81)
RDW: 13.7 % (ref 11.5–15.5)
WBC: 5.4 10*3/uL (ref 4.0–10.5)

## 2014-07-13 LAB — URINE CULTURE: Colony Count: >=100000

## 2014-07-13 LAB — BASIC METABOLIC PANEL
Anion gap: 12 (ref 5–15)
BUN: 5 mg/dL — ABNORMAL LOW (ref 6–23)
CO2: 25 mEq/L (ref 19–32)
Calcium: 9 mg/dL (ref 8.4–10.5)
Chloride: 107 mEq/L (ref 96–112)
Creatinine, Ser: 0.51 mg/dL (ref 0.50–1.35)
GFR calc Af Amer: >90 mL/min (ref >90)
GFR calc non Af Amer: >90 mL/min (ref >90)
Glucose, Bld: 88 mg/dL (ref 70–99)
Potassium: 3.5 mEq/L — ABNORMAL LOW (ref 3.7–5.3)
Sodium: 144 mEq/L (ref 137–147)

## 2014-07-13 MED ORDER — ENSURE COMPLETE PO LIQD
237.0000 mL | ORAL | Status: DC
  Administered 2014-07-13 – 2014-07-14 (×2): 237 mL via ORAL

## 2014-07-13 NOTE — Progress Notes (Signed)
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 He notices that his urine was turning dark denies any blood woke up at 4 AM on the day of admission with sweating and a temperature 102 and started having intractable nausea and vomiting came to the ED and was diagnosed with a urinary tract infection.   Assessment/Plan: UTI (urinary tract infection)/Sepsis - urine cultures shows gram-negative rodssuprapubic catheters continue to have purulent drainage. - Started on empiric antibiotics.  History of multiple sclerosis - He is followed by Dr. Lesia SagoKeith Willis with neurology. - He is bed bound.  History of hypertension - Hold Bp meds. - Will continue to monitor.   History of hypothyroidism Continue with his home medications.  History of anxiety and depression Continue with his home medications.  Mild dehydration and hypokalemia - Improved with IV fluids.    DVT Prophylaxis: Lovenox Code Status: Full Code Family Communication: discussed with the patient. No family at bedside.  Disposition Plan: Not ready for discharge. Comes from home.    Consultants:  none  Procedures:  none  Antibiotics:  Rocephin  HPI/Subjective: Fevers and chills improved, feels better  Objective: Filed Vitals:   07/12/14 0744 07/12/14 1325 07/12/14 2102 07/13/14 0427  BP: 120/76 124/82 125/75 119/87  Pulse: 87 84 83 97  Temp: 97.7 F (36.5 C) 97.9 F (36.6 C) 98.6 F (37 C) 98.5 F (36.9 C)  TempSrc: Oral Oral Oral Oral  Resp: 20 20 20 18   Height:      Weight:      SpO2: 96% 98% 99% 95%    Intake/Output Summary (Last 24 hours) at 07/13/14 1035 Last data filed at 07/13/14 0600  Gross per 24 hour  Intake 1439.25 ml  Output   2200 ml  Net -760.75 ml   Filed Weights   07/12/14 0730  Weight: 94.4 kg  (208 lb 1.8 oz)    Exam:  General: Alert, awake, oriented x3. HEENT: No bruits, no goiter.  Heart: Regular rate and rhythm. Lungs: Good air movement, clear Abdomen: Soft, nontender, nondistended, positive bowel sounds.   Data Reviewed: Basic Metabolic Panel:  Recent Labs Lab 07/10/14 0852 07/11/14 0700 07/12/14 0347 07/13/14 0419  NA 135* 139 136* 144  K 3.4* 4.0 3.3* 3.5*  CL 95* 100 100 107  CO2 25 25 23 25   GLUCOSE 121* 99 101* 88  BUN 10 8 5* 5*  CREATININE 0.51 0.54 0.47* 0.51  CALCIUM 9.7 9.3 8.9 9.0   Liver Function Tests:  Recent Labs Lab 07/11/14 0700  AST 15  ALT 12  ALKPHOS 74  BILITOT 0.4  PROT 6.4  ALBUMIN 3.3*   No results for input(s): LIPASE, AMYLASE in the last 168 hours. No results for input(s): AMMONIA in the last 168 hours. CBC:  Recent Labs Lab 07/10/14 0852 07/11/14 0700 07/12/14 0347 07/13/14 0419  WBC 12.5* 10.3 5.8 5.4  NEUTROABS 9.9*  --   --   --   HGB 13.6 11.9* 10.4* 10.1*  HCT 40.7 36.0* 30.5* 29.8*  MCV 89.5 90.7 88.2 87.4  PLT 180 118* 122* 134*   Cardiac Enzymes: No results for input(s): CKTOTAL, CKMB, CKMBINDEX, TROPONINI in the last 168 hours. BNP (last 3 results) No results for input(s): PROBNP in the last 8760 hours. CBG: No results for input(s): GLUCAP in the last 168 hours.  Recent Results (from the past 240 hour(s))  Urine culture     Status: None (Preliminary result)   Collection Time: 07/10/14  7:35 AM  Result Value Ref Range Status   Specimen Description URINE, CATHETERIZED  Final   Special Requests NONE  Final   Culture  Setup Time   Final    07/10/2014 19:03 Performed at [facility] Count   Final    >=100,000 COLONIES/ML Performed at [facility]    Culture   Final    GRAM NEGATIVE RODS Performed at [facility]    Report Status PENDING  Incomplete  Blood culture (routine x 2)     Status: None (Preliminary result)   Collection Time: 07/10/14  8:52 AM    Result Value Ref Range Status   Specimen Description BLOOD LEFT ARM  Final   Special Requests BOTTLES DRAWN AEROBIC AND ANAEROBIC [facility]  Final   Culture  Setup Time   Final    07/10/2014 18:48 Performed at [facility]    Culture   Final           BLOOD CULTURE RECEIVED NO GROWTH TO DATE CULTURE WILL BE HELD FOR 5 DAYS BEFORE ISSUING A FINAL NEGATIVE REPORT Performed at [facility]    Report Status PENDING  Incomplete  Blood culture (routine x 2)     Status: None (Preliminary result)   Collection Time: 07/10/14 10:40 AM  Result Value Ref Range Status   Specimen Description BLOOD LEFT ANTECUBITAL  Final   Special Requests BOTTLES DRAWN AEROBIC AND ANAEROBIC 5CC EACH  Final   Culture  Setup Time   Final    07/10/2014 18:48 Performed at [facility]    Culture   Final           BLOOD CULTURE RECEIVED NO GROWTH TO DATE CULTURE WILL BE HELD FOR 5 DAYS BEFORE ISSUING A FINAL NEGATIVE REPORT Performed at [facility]    Report Status PENDING  Incomplete     Studies: Ct Abdomen Pelvis W Wo Contrast  07/11/2014   CLINICAL DATA:  Generalized abdominal pain. Neurogenic bladder. Urolithiasis. Urinary tract infection. Vomiting.  EXAM: CT ABDOMEN AND PELVIS WITHOUT AND WITH CONTRAST  TECHNIQUE: Multidetector CT imaging of the abdomen and pelvis was performed following the standard protocol before and following the bolus administration of intravenous contrast.  CONTRAST:  OMNIPAQUE IOHEXOL 300 MG/ML  SOLN  COMPARISON:  None.  FINDINGS: Lower chest:  Dependent bibasilar atelectasis or scarring noted.  Hepatobiliary: No masses or other significant abnormality identified. Gallbladder is unremarkable.  Pancreas: No cystic or solid masses identified. No peripancreatic inflammatory changes or fluid collections.  Spleen:  Within normal limits in size and appearance.  Adrenal Glands:  No masses identified.  Kidneys/Urinary Tract: No evidence of nephrolithiasis or  hydronephrosis. No complex cystic or solid renal masses identified. No masses seen involving lower urinary tract.  Several small less than 1 cm calculi are seen the urinary bladder. Suprapubic bladder catheter is seen in place. Diffuse bladder wall thickening is seen, consistent with cystitis. No evidence of focal bladder mass.  Stomach/Bowel/Peritoneum: No evidence of wall thickening, mass, or obstruction.  Vascular/Lymphatic: No pathologically enlarged lymph nodes identified. No other significant abnormality identified.  Reproductive:  No mass or other significant abnormality identified.  Other:  None.  Musculoskeletal:  No suspicious bone lesions identified.  IMPRESSION: Diffuse bladder wall thickening again seen, consistent with chronic cystitis.  Several small sub-cm calculi within the urinary bladder. No  evidence of ureteral calculi or hydronephrosis.   Electronically Signed   By: Myles Rosenthal M.D.   On: 07/11/2014 14:36    Scheduled Meds: . amitriptyline  100 mg Oral QHS  . baclofen  40 mg Oral TID  . cefTRIAXone (ROCEPHIN)  IV  1 g Intravenous Q24H  . clonazePAM  1 mg Oral BID  . dantrolene  100 mg Oral TID  . docusate sodium  100 mg Oral TID  . enoxaparin (LOVENOX) injection  40 mg Subcutaneous Q24H  . levothyroxine  100 mcg Oral QAC breakfast  . mirabegron ER  50 mg Oral Daily  . neomycin-bacitracin-polymyxin   Topical TID  . pantoprazole  40 mg Oral Daily  . perphenazine  2 mg Oral BID  . sertraline  200 mg Oral QHS  . tiZANidine  8 mg Oral TID   Continuous Infusions:    Marinda Elk  [facility] Pager [phone]. If 8PM-8AM, please contact night-coverage at [email or web address], password [facility] 07/13/2014, 10:35 AM  LOS: 3 days

## 2014-07-13 NOTE — Progress Notes (Addendum)
INITIAL NUTRITION ASSESSMENT  DOCUMENTATION CODES Per approved criteria  -Non-severe (moderate) malnutrition in the context of chronic illness  Pt meets criteria for moderate MALNUTRITION in the context of chronic illness as evidenced by moderate muscle wasting/fat loss in clavicle and upper arm region, PO intake < 75% for > one month   INTERVENTION: -Recommend Ensure Complete once daily -Encouraged intake of balanced meals and snack intake -RD to continue to monitor  NUTRITION DIAGNOSIS: Inadequate oral intake related to decreased appetite as evidenced by poor PO intake, hx of unintentional wt loss.   Goal: Pt to meet >/= 90% of their estimated nutrition needs    Monitor:  Total protein/kcal intake, labs, weights, supplement acceptance  Reason for Assessment: Low Braden Score (<12)  53 y.o. male  Admitting Dx: UTI (urinary tract infection)  ASSESSMENT: 53 y.o.male this morning he woke up at about 4:30 AM with chills and sweating episode. He had a fever of 102F. He had 3 episodes of bilious, nonbloody vomiting. Denies any abdominal pain per se. Denies any diarrhea. He subsequently decided to come into the hospital. Has had generalized weakness.  -Pt reported an unintentional wt loss of 20 lbs in past two years d/t decreased intake, weakness, and progression of chronic diseases. Is unsure of usual body weight d/t bedbound status -Diet recall indicates pt consuming one meal daily with nighttime snack of beef jerky and candies. Has caregiver that assists with meals; usually provides pt with fast foods or take out. Enjoys salads, stir-fry, and chicken or steak dishes -Has occasional nausea; however this may be related to night time snack. Does not significantly impact PO intake -Has had poor PO intake during admit -Was willing to consume nutrition supplement once daily. Encouraged intake of balanced meals and snacks w/incorporation of heart healthy proteins to assist in recovery and  muscle maintenance -Moderate muscle wasting noted in clavicle region  Height: Ht Readings from Last 1 Encounters:  07/10/14 6\' 1"  (1.854 m)    Weight: Wt Readings from Last 1 Encounters:  07/12/14 208 lb 1.8 oz (94.4 kg)    Ideal Body Weight: 184 lb  % Ideal Body Weight: 113%  Wt Readings from Last 10 Encounters:  07/12/14 208 lb 1.8 oz (94.4 kg)  06/28/14 174 lb (78.926 kg)  03/21/14 175 lb (79.379 kg)  03/02/14 171 lb 11.8 oz (77.9 kg)  09/09/13 190 lb (86.183 kg)  10/20/12 200 lb (90.719 kg)  02/08/13 190 lb (86.183 kg)  12/25/12 195 lb (88.451 kg)  11/27/12 195 lb (88.451 kg)  09/09/12 195 lb (88.451 kg)    Usual Body Weight: pt unsure  % Usual Body Weight: n/a  BMI:  Body mass index is 27.46 kg/(m^2).  Estimated Nutritional Needs: Kcal: 1800-2000 Protein: 95-105 gram Fluid: >/= 1800 ml daily  Skin: + 1 RLE, +1 LLE edemas  Diet Order: Diet regular  EDUCATION NEEDS: -No education needs identified at this time   Intake/Output Summary (Last 24 hours) at 07/13/14 1610 Last data filed at 07/13/14 0600  Gross per 24 hour  Intake    720 ml  Output   1700 ml  Net   -980 ml    Last BM: 11/07   Labs:   Recent Labs Lab 07/11/14 0700 07/12/14 0347 07/13/14 0419  NA 139 136* 144  K 4.0 3.3* 3.5*  CL 100 100 107  CO2 25 23 25   BUN 8 5* 5*  CREATININE 0.54 0.47* 0.51  CALCIUM 9.3 8.9 9.0  GLUCOSE 99 101* 88  CBG (last 3)  No results for input(s): GLUCAP in the last 72 hours.  Scheduled Meds: . amitriptyline  100 mg Oral QHS  . baclofen  40 mg Oral TID  . cefTRIAXone (ROCEPHIN)  IV  1 g Intravenous Q24H  . clonazePAM  1 mg Oral BID  . dantrolene  100 mg Oral TID  . docusate sodium  100 mg Oral TID  . enoxaparin (LOVENOX) injection  40 mg Subcutaneous Q24H  . levothyroxine  100 mcg Oral QAC breakfast  . mirabegron ER  50 mg Oral Daily  . neomycin-bacitracin-polymyxin   Topical TID  . pantoprazole  40 mg Oral Daily  . perphenazine  2 mg  Oral BID  . sertraline  200 mg Oral QHS  . tiZANidine  8 mg Oral TID    Continuous Infusions:   Past Medical History  Diagnosis Date  . Hypertension   . GERD (gastroesophageal reflux disease)   . Hypothyroidism   . Vitamin D deficiency   . Chronic back pain   . Gait disorder   . 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 Multiple sclerosis NEUROLOGIST-- DR Anne HahnWILLIS    dx 1993, JC virus negative 2/14---  CURRENTLY RECEIVING TYSABRI IV TREATMENT  . Pneumonia     dx  03-03-2014--  admitted for iv and oral antibiotics  . Urinary retention   . Neurogenic bladder   . Foley catheter in place   . Incontinence of urine   . Hyperlipidemia   . Bladder calculi   . Shortness of breath     secondary to fatique  . Chronic fatigue   . Neurogenic bowel     intermittant constipation and diarrhea  . Yeast infection     genital area    Past Surgical History  Procedure Laterality Date  . Appendectomy  age 53  . Nasal septum surgery  1996  . Negative sleep study  1996  . Transthoracic echocardiogram  02-07-2004    MILD LVH/  EF 55-65%  . Insertion of suprapubic catheter N/A 03/21/2014    Procedure: INSERTION OF SUPRAPUBIC CATHETER;  Surgeon: Valetta Fulleravid S Grapey, MD;  [facility];  Service: Urology;  Laterality: N/A;  . Cystoscopy N/A 03/21/2014    Procedure: CYSTOSCOPY TREATMENT OF BLADDER CALCULI;  Surgeon: Valetta Fulleravid S Grapey, MD;  [facility];  Service: Urology;  Laterality: N/A;    Lloyd HugerSarah F Trung Wenzl MS RD LDN Clinical Dietitian Pager:[phone]

## 2014-07-13 NOTE — Plan of Care (Signed)
Problem: Phase I Progression Outcomes Goal: Initial discharge plan identified Outcome: Progressing Goal: Adequate I & O Outcome: Completed/Met Date Met:  07/13/14 Goal: Tolerating diet Outcome: Completed/Met Date Met:  07/13/14 Goal: Hemodynamically stable Outcome: Completed/Met Date Met:  07/13/14

## 2014-07-14 DIAGNOSIS — Z8619 Personal history of other infectious and parasitic diseases: Secondary | ICD-10-CM | POA: Diagnosis present

## 2014-07-14 DIAGNOSIS — E440 Moderate protein-calorie malnutrition: Secondary | ICD-10-CM | POA: Insufficient documentation

## 2014-07-14 DIAGNOSIS — G8389 Other specified paralytic syndromes: Secondary | ICD-10-CM

## 2014-07-14 MED ORDER — SODIUM CHLORIDE 0.9 % IJ SOLN: 10.0000 mL | INTRAMUSCULAR | Status: DC | PRN

## 2014-07-14 MED ORDER — SODIUM CHLORIDE 0.9 % IV SOLN
1.0000 g | INTRAVENOUS | Status: DC
  Administered 2014-07-14: 1 g via INTRAVENOUS
  Filled 2014-07-14: qty 1

## 2014-07-14 MED ORDER — SODIUM CHLORIDE 0.9 % IJ SOLN
10.0000 mL | Freq: Two times a day (BID) | INTRAMUSCULAR | Status: DC
  Administered 2014-07-14: 20 mL

## 2014-07-14 MED ORDER — TIZANIDINE HCL 4 MG PO TABS
8.0000 mg | ORAL_TABLET | Freq: Four times a day (QID) | ORAL | Status: DC
  Administered 2014-07-14 (×3): 8 mg via ORAL
  Filled 2014-07-14 (×4): qty 2

## 2014-07-14 MED ORDER — SODIUM CHLORIDE 0.9 % IV SOLN
500.0000 mg | Freq: Four times a day (QID) | INTRAVENOUS | Status: DC
  Administered 2014-07-14 (×2): 500 mg via INTRAVENOUS
  Filled 2014-07-14 (×4): qty 500

## 2014-07-14 MED ORDER — SODIUM CHLORIDE 0.9 % IV SOLN: 1.0000 g | INTRAVENOUS | Status: DC

## 2014-07-14 MED ORDER — BACLOFEN 20 MG PO TABS
40.0000 mg | ORAL_TABLET | Freq: Four times a day (QID) | ORAL | Status: DC
  Administered 2014-07-14 (×3): 40 mg via ORAL
  Filled 2014-07-14 (×4): qty 2

## 2014-07-14 NOTE — Progress Notes (Signed)
Peripherally Inserted Central Catheter/Midline Placement  The IV Nurse has discussed with the patient and/or persons authorized to consent for the patient, the purpose of this procedure and the potential benefits and risks involved with this procedure.  The benefits include less needle sticks, lab draws from the catheter and patient may be discharged home with the catheter.  Risks include, but not limited to, infection, bleeding, blood clot (thrombus formation), and puncture of an artery; nerve damage and irregular heat beat.  Alternatives to this procedure were also discussed.  PICC/Midline Placement Documentation        Lisabeth Devoid 07/14/2014, 2:41 PM Consent obtained by Stacie Glaze, RN

## 2014-07-14 NOTE — Discharge Summary (Signed)
Physician Discharge Summary  Samuel [name] CXK:[id number] DOB: [birth year] DOA: 07/10/2014  PCP: [name], MD  Admit date: 07/10/2014 Discharge date: 07/14/2014  Time spent:  Recommendations for Outpatient Follow-up:  1. Follow up with PCP in 2 weeks  Discharge Diagnoses:  Principal Problem:   UTI (urinary tract infection) Active Problems:   Multiple sclerosis   Spastic quadriparesis   Neurogenic bladder   Sepsis   Malnutrition of moderate degree   History of ESBL Klebsiella pneumoniae infection   Discharge Condition: stable  Diet recommendation: regular  Filed Weights   07/12/14 0730  Weight: 94.4 kg (208 lb 1.8 oz)    History of present illness:  53 y.o. 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 He also has a history of hypertension and hypothyroidism. He was in his usual state of health about 2-3 days ago when he had his suprapubic catheter replaced. He has been experiencing some bladder spasms and incontinence as a result of which he started using a condom catheter since Saturday. And then he started noticing that his urine was turning dark. Denies any blood in the urine. This morning he woke up at about 4:30 AM with chills and sweating episode. He had a fever of 102F. He had 3 episodes of bilious, nonbloody vomiting. Denies any abdominal pain per se. Denies any diarrhea. He subsequently decided to come into the hospital. Has had generalized weakness.  Hospital Course:  UTI (urinary tract infection)/Sepsis: - started on IV fluid and Vanc and cefepime. - urine cultures shows ESBL klebsiella. - Antibiotic Invanz changed on 11.12.2015. - Place PICC line 11.12.2015. - PICC to be D/c by home health once antiobioyic regimen completed in 7 days.  History of multiple sclerosis - He is followed by Dr. Lesia Sago with  neurology. - He is bed bound.  History of hypertension - Hold Bp meds. - Will continue to monitor.   History of hypothyroidism Continue with his home medications.  History of anxiety and depression Continue with his home medications.  Mild dehydration and hypokalemia - KVO IV fluids. - resolced.  Procedures:  none  Consultations:  none  Discharge Exam: Filed Vitals:   07/14/14 0454  BP: 131/82  Pulse: 72  Temp: 98.4 F (36.9 C)  Resp: 16    General: A&O x3 Cardiovascular: RRR Respiratory: good air movement CTA B/L  Discharge Instructions You were cared for by a hospitalist during your hospital stay. If you have any questions about your discharge medications or the care you received while you were in the hospital after you are discharged, you can call the unit and asked to speak with the hospitalist on call if the hospitalist that took care of you is not available. Once you are discharged, your primary care physician will handle any further medical issues. Please note that NO REFILLS for any discharge medications will be authorized once you are discharged, as it is imperative that you return to your primary care physician (or establish a relationship with a primary care physician if you do not have one) for your aftercare needs so that they can reassess your need for medications and monitor your lab values.  Discharge Instructions    Diet - low sodium heart healthy    Complete by:  As directed      Face-to-face encounter (required for Medicare/Medicaid patients)    Complete by:  As directed  I Marinda ElkFELIZ ORTIZ, Rowan Blaker certify that this patient is under my care and that I, or a nurse practitioner or physician's assistant working with me, had a face-to-face encounter that meets the physician face-to-face encounter requirements with this patient on 07/14/2014. The encounter with the patient was in whole, or in part for the following medical condition(s) which is the primary  reason for home health care (List medical condition): ESBL UTI  The encounter with the patient was in whole, or in part, for the following medical condition, which is the primary reason for home health care:  ESBL UTI  I certify that, based on my findings, the following services are medically necessary home health services:  Nursing  Reason for Medically Necessary Home Health Services:  Other See Comments  My clinical findings support the need for the above services:  OTHER SEE COMMENTS  Further, I certify that my clinical findings support that this patient is homebound due to:  Can transfer bed to chair only     Home Health    Complete by:  As directed   To provide the following care/treatments:   PTOTRN       Increase activity slowly    Complete by:  As directed           Current Discharge Medication List    START taking these medications   Details  ertapenem 1 g in sodium chloride 0.9 % 50 mL Inject 1 g into the vein daily. Qty: 7 Bottle, Refills: 0      CONTINUE these medications which have NOT CHANGED   Details  !! amitriptyline (ELAVIL) 100 MG tablet Take 100 mg by mouth at bedtime.    !! baclofen (LIORESAL) 20 MG tablet Take 40 mg by mouth 3 (three) times daily.    !! clonazePAM (KLONOPIN) 1 MG tablet Take 1 mg by mouth 2 (two) times daily.    clotrimazole-betamethasone (LOTRISONE) cream Apply 1 application topically 2 (two) times daily as needed (for skin irritations).     Cranberry-Vitamin C-Probiotic (AZO CRANBERRY PO) Take 1 capsule by mouth 2 (two) times daily.     !! dantrolene (DANTRIUM) 100 MG capsule Take 100 mg by mouth 3 (three) times daily.    !! diazepam (VALIUM) 10 MG tablet Take 10 mg by mouth at bedtime as needed for anxiety.    docusate sodium (COLACE) 100 MG capsule Take 100 mg by mouth 3 (three) times daily.    ibuprofen (ADVIL,MOTRIN) 200 MG tablet Take 600 mg by mouth as needed for fever or moderate pain.     levothyroxine (SYNTHROID,  LEVOTHROID) 100 MCG tablet Take 100 mcg by mouth daily before breakfast.     lisinopril-hydrochlorothiazide (PRINZIDE,ZESTORETIC) 10-12.5 MG per tablet Take 0.5 tablets by mouth every morning.     mirabegron ER (MYRBETRIQ) 50 MG TB24 tablet Take 50 mg by mouth daily.    omeprazole (PRILOSEC) 20 MG capsule Take 20 mg by mouth every morning.     !! perphenazine (TRILAFON) 2 MG tablet Take 2 mg by mouth 2 (two) times daily.    sertraline (ZOLOFT) 100 MG tablet Take 200 mg by mouth at bedtime.     solifenacin (VESICARE) 10 MG tablet Take 10 mg by mouth daily.    tiZANidine (ZANAFLEX) 4 MG capsule Take 8 mg by mouth 3 (three) times daily.    traMADol (ULTRAM) 50 MG tablet Take 1 tablet (50 mg total) by mouth every 6 (six) hours as needed. Qty: 60 tablet, Refills: 3  Vitamin D, Ergocalciferol, (DRISDOL) 50000 UNITS CAPS Take 50,000 Units by mouth every 7 (seven) days. Friday only    !! amitriptyline (ELAVIL) 100 MG tablet take 1 tablet by mouth at bedtime Qty: 90 tablet, Refills: 1    !! baclofen (LIORESAL) 20 MG tablet take 2 tablets by mouth three times a day Qty: 540 tablet, Refills: 1    !! clonazePAM (KLONOPIN) 1 MG tablet take 1 tablet by mouth twice a day Qty: 60 tablet, Refills: 5    !! dantrolene (DANTRIUM) 100 MG capsule take 1 capsule by mouth three times a day Qty: 90 capsule, Refills: 5    !! diazepam (VALIUM) 10 MG tablet take 1 tablet by mouth AT NIGHT IF NEEDED Qty: 30 tablet, Refills: 5    HYDROcodone-acetaminophen (NORCO/VICODIN) 5-325 MG per tablet Take 1-2 tablets by mouth every 6 (six) hours as needed. Qty: 60 tablet, Refills: 0    !! perphenazine (TRILAFON) 2 MG tablet take 1 tablet by mouth twice a day Qty: 60 tablet, Refills: 6    terazosin (HYTRIN) 10 MG capsule Take 10 mg by mouth at bedtime.     tiZANidine (ZANAFLEX) 4 MG tablet take 1 tablet by mouth every 4 hours if needed Qty: 180 tablet, Refills: 6     !! - Potential duplicate medications  found. Please discuss with provider.     Allergies  Allergen Reactions  . Penicillins Other (See Comments)    Unknown childhoood reaction (takes amoxicillian with reaction) Has tolerated ceftriaxone, cefepime, and Primaxin during inpatient admissions      The results of significant diagnostics from this hospitalization (including imaging, microbiology, ancillary and laboratory) are listed below for reference.    Significant Diagnostic Studies: Ct Abdomen Pelvis W Wo Contrast  07/11/2014   CLINICAL DATA:  Generalized abdominal pain. Neurogenic bladder. Urolithiasis. Urinary tract infection. Vomiting.  EXAM: CT ABDOMEN AND PELVIS WITHOUT AND WITH CONTRAST  TECHNIQUE: Multidetector CT imaging of the abdomen and pelvis was performed following the standard protocol before and following the bolus administration of intravenous contrast.  CONTRAST:  OMNIPAQUE IOHEXOL 300 MG/ML  SOLN  COMPARISON:  None.  FINDINGS: Lower chest:  Dependent bibasilar atelectasis or scarring noted.  Hepatobiliary: No masses or other significant abnormality identified. Gallbladder is unremarkable.  Pancreas: No cystic or solid masses identified. No peripancreatic inflammatory changes or fluid collections.  Spleen:  Within normal limits in size and appearance.  Adrenal Glands:  No masses identified.  Kidneys/Urinary Tract: No evidence of nephrolithiasis or hydronephrosis. No complex cystic or solid renal masses identified. No masses seen involving lower urinary tract.  Several small less than 1 cm calculi are seen the urinary bladder. Suprapubic bladder catheter is seen in place. Diffuse bladder wall thickening is seen, consistent with cystitis. No evidence of focal bladder mass.  Stomach/Bowel/Peritoneum: No evidence of wall thickening, mass, or obstruction.  Vascular/Lymphatic: No pathologically enlarged lymph nodes identified. No other significant abnormality identified.  Reproductive:  No mass or other significant  abnormality identified.  Other:  None.  Musculoskeletal:  No suspicious bone lesions identified.  IMPRESSION: Diffuse bladder wall thickening again seen, consistent with chronic cystitis.  Several small sub-cm calculi within the urinary bladder. No evidence of ureteral calculi or hydronephrosis.   Electronically Signed   By: Myles Rosenthal M.D.   On: 07/11/2014 14:36   Dg Chest 2 View  07/10/2014   CLINICAL DATA:  53 year old male with 2 day history of cough and shortness of breath and 1 day history of  fever. Medical history of multiple sclerosis which limits ability of patient's to raise arms for the lateral view.  EXAM: CHEST  2 VIEW  COMPARISON:  Prior chest x-ray and chest CT 03/02/2014  FINDINGS: Cardiac and mediastinal contours remain within normal limits. Inspiratory volumes are low but unchanged compared to prior imaging. No focal airspace consolidation. Interval resolution of airspace consolidation in the left mid lung and base compared to 03/02/2014. No pneumothorax or pleural effusion. Central bronchitic changes and mild interstitial prominence remain unchanged. No acute osseous abnormality.  IMPRESSION: No acute cardiopulmonary process.  Chronically low inspiratory volumes and pulmonary parenchymal disease.   Electronically Signed   By: Malachy Moan M.D.   On: 07/10/2014 08:18    Microbiology: Recent Results (from the past 240 hour(s))  Urine culture     Status: None   Collection Time: 07/10/14  7:35 AM  Result Value Ref Range Status   Specimen Description URINE, CATHETERIZED  Final   Special Requests NONE  Final   Culture  Setup Time   Final    07/10/2014 19:03 Performed at [facility]   Final    >=100,000 COLONIES/ML Performed at [facility]    Culture   Final    KLEBSIELLA PNEUMONIAE Note: Confirmed Extended Spectrum Beta-Lactamase Producer (ESBL) CITROBACTER KOSERI Note: CRITICAL RESULT CALLED TO, READ BACK BY AND VERIFIED WITH: NADINE  ACHEA 07/13/14 AT 2343 RIDK Performed at [facility]    Report Status 07/13/2014 FINAL  Final   Organism ID, Bacteria KLEBSIELLA PNEUMONIAE  Final   Organism ID, Bacteria CITROBACTER KOSERI  Final      Susceptibility   Citrobacter koseri - MIC*    CEFAZOLIN <=4 SENSITIVE Sensitive     CEFTRIAXONE <=1 SENSITIVE Sensitive     CIPROFLOXACIN <=0.25 SENSITIVE Sensitive     GENTAMICIN <=1 SENSITIVE Sensitive     LEVOFLOXACIN <=0.12 SENSITIVE Sensitive     NITROFURANTOIN <=16 SENSITIVE Sensitive     TOBRAMYCIN <=1 SENSITIVE Sensitive     TRIMETH/SULFA <=20 SENSITIVE Sensitive     PIP/TAZO <=4 SENSITIVE Sensitive     * CITROBACTER KOSERI   Klebsiella pneumoniae - MIC*    AMPICILLIN >=32 RESISTANT Resistant     CEFAZOLIN >=64 RESISTANT Resistant     CEFTRIAXONE RESISTANT      CIPROFLOXACIN >=4 RESISTANT Resistant     GENTAMICIN >=16 RESISTANT Resistant     LEVOFLOXACIN >=8 RESISTANT Resistant     NITROFURANTOIN 256 RESISTANT Resistant     TOBRAMYCIN 8 INTERMEDIATE Intermediate     TRIMETH/SULFA >=320 RESISTANT Resistant     IMIPENEM 0.5 SENSITIVE Sensitive     PIP/TAZO* 32 INTERMEDIATE Intermediate      * SET UP TIME:  [id number]    * KLEBSIELLA PNEUMONIAE  Blood culture (routine x 2)     Status: None (Preliminary result)   Collection Time: 07/10/14  8:52 AM  Result Value Ref Range Status   Specimen Description BLOOD LEFT ARM  Final   Special Requests BOTTLES DRAWN AEROBIC AND ANAEROBIC [facility] EACH  Final   Culture  Setup Time   Final    07/10/2014 18:48 Performed at [facility]    Culture   Final           BLOOD CULTURE RECEIVED NO GROWTH TO DATE CULTURE WILL BE HELD FOR 5 DAYS BEFORE ISSUING A FINAL NEGATIVE REPORT Performed at [facility]    Report Status PENDING  Incomplete  Blood culture (  routine x 2)     Status: None (Preliminary result)   Collection Time: 07/10/14 10:40 AM  Result Value Ref Range Status   Specimen Description BLOOD LEFT  ANTECUBITAL  Final   Special Requests BOTTLES DRAWN AEROBIC AND ANAEROBIC 5CC EACH  Final   Culture  Setup Time   Final    07/10/2014 18:48 Performed at [facility]    Culture   Final           BLOOD CULTURE RECEIVED NO GROWTH TO DATE CULTURE WILL BE HELD FOR 5 DAYS BEFORE ISSUING A FINAL NEGATIVE REPORT Performed at [facility]    Report Status PENDING  Incomplete     Labs: Basic Metabolic Panel:  Recent Labs Lab 07/10/14 0852 07/11/14 0700 07/12/14 0347 07/13/14 0419  NA 135* 139 136* 144  K 3.4* 4.0 3.3* 3.5*  CL 95* 100 100 107  CO2 25 25 23 25   GLUCOSE 121* 99 101* 88  BUN 10 8 5* 5*  CREATININE 0.51 0.54 0.47* 0.51  CALCIUM 9.7 9.3 8.9 9.0   Liver Function Tests:  Recent Labs Lab 07/11/14 0700  AST 15  ALT 12  ALKPHOS 74  BILITOT 0.4  PROT 6.4  ALBUMIN 3.3*   No results for input(s): LIPASE, AMYLASE in the last 168 hours. No results for input(s): AMMONIA in the last 168 hours. CBC:  Recent Labs Lab 07/10/14 0852 07/11/14 0700 07/12/14 0347 07/13/14 0419  WBC 12.5* 10.3 5.8 5.4  NEUTROABS 9.9*  --   --   --   HGB 13.6 11.9* 10.4* 10.1*  HCT 40.7 36.0* 30.5* 29.8*  MCV 89.5 90.7 88.2 87.4  PLT 180 118* 122* 134*   Cardiac Enzymes: No results for input(s): CKTOTAL, CKMB, CKMBINDEX, TROPONINI in the last 168 hours. BNP: BNP (last 3 results) No results for input(s): PROBNP in the last 8760 hours. CBG: No results for input(s): GLUCAP in the last 168 hours.     Signed:  Marinda Elk  [facility] 07/14/2014, 11:46 AM

## 2014-07-14 NOTE — Progress Notes (Addendum)
Pt has a critical urine culture positive for Klebsiella Pneumonia and positive for ESBL. Md notified. And pt placed on contact isolation.

## 2014-07-14 NOTE — Care Management Note (Signed)
Page 1 of 2   07/14/2014     2:02:06 PM CARE MANAGEMENT NOTE 07/14/2014  Patient:  [name],[name]   Account Number:  [id number]  Date Initiated:  07/12/2014  Documentation initiated by:  CLEMENTS,NORA  Subjective/Objective Assessment:   53 yo admitted with UTI. Hx of Hypertension       GERD (gastroesophageal reflux disease)       Hypothyroidism       Vitamin D deficiency       Chronic back pain       Gait disorder       Spastic quadriparesis      Multiple sclerosis       Action/Plan:   From home alone.  Has private caregiver and also receives help from mother, brother and sister that live nearby.   Anticipated DC Date:  07/15/2014   Anticipated DC Plan:  HOME W HOME HEALTH SERVICES      DC Planning Services  CM consult      PAC Choice  HOME HEALTH   Choice offered to / List presented to:  C-1 Patient   DME arranged  IV PUMP/EQUIPMENT      DME agency  [facility]     HH arranged  HH-1 RN  HH-2 PT  HH-3 OT  IV Antibiotics      HH agency  [facility]   Status of service:  Completed, signed off Medicare Important Message given?  YES (If response is "NO", the following Medicare IM given date fields will be blank) Date Medicare IM given:  07/14/2014 Medicare IM given by:   Date Additional Medicare IM given:   Additional Medicare IM given by:    Discharge Disposition:  HOME W HOME HEALTH SERVICES  Per UR Regulation:  Reviewed for med. necessity/level of care/duration of stay  If discussed at Long Length of Stay Meetings, dates discussed:    Comments:  07/14/14 11:00 CM met with pt in room to offer choice of HHRN/PT/OT.  Pt is to receive home IV ABX.  Pt chooses [facility] to render these services.  CM spoke with Kristen who states Pam Chandler will speak to pt.  CM spoke with IV Team and PICC will be placed today.  Pt states he has a caretaker, Karl, who is an EMT and will be able to be taught to run home IV ABX.  CM spoke with RN to run  today's dose of INVANZ before discharge (preferably thru PICC.  [facility] rep, Kristen aware and making final arrangement.  No other CM needs were communicated.   , BSN, CM [phone].  07/12/14  Nora Clements RN,BSN,NCM [phone] Met with pt and mom about DC plan.  Pt states he has everything he needs at home.  He states he has a ceiling sling to help him get to the bathroom. His family is very supportive and his caregiver is with him several hours every day.  CM will continue to follow for DC needs.   

## 2014-07-14 NOTE — Progress Notes (Signed)
ANTIBIOTIC CONSULT NOTE - INITIAL  Pharmacy Consult for ertapenem Indication: ESBL UTI  Allergies  Allergen Reactions  . Penicillins Other (See Comments)    Unknown childhoood reaction (takes amoxicillian with reaction)    Patient Measurements: Height: 6\' 1"  (185.4 cm) Weight: 208 lb 1.8 oz (94.4 kg) IBW/kg (Calculated) : 79.9  Medical History: Past Medical History  Diagnosis Date  . Hypertension   . GERD (gastroesophageal reflux disease)   . Hypothyroidism   . Vitamin D deficiency   . Chronic back pain   . Gait disorder   . 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 Multiple sclerosis NEUROLOGIST-- [name]    dx 1993, JC virus negative 2/14---  CURRENTLY RECEIVING TYSABRI IV TREATMENT  . Pneumonia     dx  03-03-2014--  admitted for iv and oral antibiotics  . Urinary retention   . Neurogenic bladder   . Foley catheter in place   . Incontinence of urine   . Hyperlipidemia   . Bladder calculi   . Shortness of breath     secondary to fatique  . Chronic fatigue   . Neurogenic bowel     intermittant constipation and diarrhea  . Yeast infection     genital area    Assessment: 75 YOM presents to ED with fever, chills, and N/V on 11/8. He has h/o neurogenic bladder with indwelling catheter (changed 2 days PTA), quadriplegic. Originally started on cefepime and vancomycin per Pharmacy for for possible sepsis, possible urinary tract infection.   Antibiotics narrowed to ceftriaxone on admission by MD with many bacteria and WBC seen on UA, no history of ESBL organisms in Epic.   Urine culture now shows ESBL organism. Pt started on Primaxin per Pharmacy overnight 11/12, now being changed to ertapenem for ease of dosing, Primaxin national shortage, and no Pseudomonas coverage needed.   Noted history of PCN allergy w/ unknown rxn to amoxicillin when child - has tolerated ceftriaxone, cefepime, and Primaxin this admission  Antiinfectives  Note- today is D#1 of adequate coverage of  ESBL organism 11/8 >> vancomycin >>11/8 11/8 >> cefepime >> 11/8 11/8 >> ceftriaxone (MD) >> 11/11 11/12 >> primaxin >> 11/12 11/12 >> ertapenem >>  Labs / vitals Tmax: afebrile WBCs: WNL Renal: SCr = 0.5- stable, CrCl > 100 ml/min CG/N. Suspect SCr may over-estimate renal function d/t chronic conditions (MS, spastic quadriparesis)  Microbiology 11/8 blood x2: ngtd 11/8 urine: >100k K.pneumo- ESBL (S=imipenem ONLY)               Citrobacter koseri (pansensitive)  Goal of Therapy:  ertapenem per indication, eradication of infection  Plan:  - stop Primaxin - start ertapenem 1g IV q24h today at 1200 (when next Primaxin dose is due) - follow-up clinical course, renal function - follow-up antibiotic length of therapy  Thank you for the consult.  Ross Ludwig, PharmD, BCPS Pager: [phone] Pharmacy: [phone] 07/14/2014 10:41 AM

## 2014-07-14 NOTE — Progress Notes (Signed)
Patient was stable at time of discharge. Patient had PICC placed before discharge. I reviewed discharge education with patient's family. They verbalized understanding of advanced home care resources. I premedicated patient before we transferred him by maxi move lift from bed to wheelchair.

## 2014-07-14 NOTE — Progress Notes (Signed)
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 He notices that his urine was turning dark denies any blood woke up at 4 AM on the day of admission with sweating and a temperature 102 and started having intractable nausea and vomiting came to the ED and was diagnosed with a urinary tract infection.   Assessment/Plan: UTI (urinary tract infection)/Sepsis - urine cultures shows gram-negative rodssuprapubic catheters continue to have purulent drainage. - Started on empiric antibiotics on admisison. Changed to Invanz. - Place PICC line 11.12.2015.  History of multiple sclerosis - He is followed by Dr. Lesia Sago with neurology. - He is bed bound.  History of hypertension - Hold Bp meds. - Will continue to monitor.   History of hypothyroidism Continue with his home medications.  History of anxiety and depression Continue with his home medications.  Mild dehydration and hypokalemia - KVO IV fluids. - resolced.   DVT Prophylaxis: Lovenox Code Status: Full Code Family Communication: discussed with the patient. No family at bedside.  Disposition Plan: Not ready for discharge. Comes from home.    Consultants:  none  Procedures:  none  Antibiotics:  Rocephin  HPI/Subjective: No complains  Objective: Filed Vitals:   07/13/14 0427 07/13/14 1408 07/13/14 1900 07/14/14 0454  BP: 119/87 122/75 122/80 131/82  Pulse: 97 83 79 72  Temp: 98.5 F (36.9 C) 98.3 F (36.8 C) 98.4 F (36.9 C) 98.4 F (36.9 C)  TempSrc: Oral Oral Oral Oral  Resp: 18 16 18 16   Height:      Weight:      SpO2: 95% 99% 96% 98%    Intake/Output Summary (Last 24 hours) at 07/14/14 1133 Last data filed at 07/14/14 1030  Gross per 24 hour  Intake    490 ml  Output   1325 ml  Net   -835 ml   Filed  Weights   07/12/14 0730  Weight: 94.4 kg (208 lb 1.8 oz)    Exam:  General: Alert, awake, oriented x3. HEENT: No bruits, no goiter.  Heart: Regular rate and rhythm. Lungs: Good air movement, clear Abdomen: Soft, nontender, nondistended, positive bowel sounds.   Data Reviewed: Basic Metabolic Panel:  Recent Labs Lab 07/10/14 0852 07/11/14 0700 07/12/14 0347 07/13/14 0419  NA 135* 139 136* 144  K 3.4* 4.0 3.3* 3.5*  CL 95* 100 100 107  CO2 25 25 23 25   GLUCOSE 121* 99 101* 88  BUN 10 8 5* 5*  CREATININE 0.51 0.54 0.47* 0.51  CALCIUM 9.7 9.3 8.9 9.0   Liver Function Tests:  Recent Labs Lab 07/11/14 0700  AST 15  ALT 12  ALKPHOS 74  BILITOT 0.4  PROT 6.4  ALBUMIN 3.3*   No results for input(s): LIPASE, AMYLASE in the last 168 hours. No results for input(s): AMMONIA in the last 168 hours. CBC:  Recent Labs Lab 07/10/14 0852 07/11/14 0700 07/12/14 0347 07/13/14 0419  WBC 12.5* 10.3 5.8 5.4  NEUTROABS 9.9*  --   --   --   HGB 13.6 11.9* 10.4* 10.1*  HCT 40.7 36.0* 30.5* 29.8*  MCV 89.5 90.7 88.2 87.4  PLT 180 118* 122* 134*   Cardiac Enzymes: No results for input(s): CKTOTAL, CKMB, CKMBINDEX, TROPONINI in the last 168 hours. BNP (last 3 results) No results for input(s): PROBNP in the last 8760 hours. CBG: No results for input(s): GLUCAP in the  last 168 hours.  Recent Results (from the past 240 hour(s))  Urine culture     Status: None   Collection Time: 07/10/14  7:35 AM  Result Value Ref Range Status   Specimen Description URINE, CATHETERIZED  Final   Special Requests NONE  Final   Culture  Setup Time   Final    07/10/2014 19:03 Performed at [facility] Count   Final    >=100,000 COLONIES/ML Performed at [facility]    Culture   Final    KLEBSIELLA PNEUMONIAE Note: Confirmed Extended Spectrum Beta-Lactamase Producer (ESBL) CITROBACTER KOSERI Note: CRITICAL RESULT CALLED TO, READ BACK BY AND VERIFIED WITH: NADINE  ACHEA 07/13/14 AT 2343 RIDK Performed at [facility]    Report Status 07/13/2014 FINAL  Final   Organism ID, Bacteria KLEBSIELLA PNEUMONIAE  Final   Organism ID, Bacteria CITROBACTER KOSERI  Final      Susceptibility   Citrobacter koseri - MIC*    CEFAZOLIN <=4 SENSITIVE Sensitive     CEFTRIAXONE <=1 SENSITIVE Sensitive     CIPROFLOXACIN <=0.25 SENSITIVE Sensitive     GENTAMICIN <=1 SENSITIVE Sensitive     LEVOFLOXACIN <=0.12 SENSITIVE Sensitive     NITROFURANTOIN <=16 SENSITIVE Sensitive     TOBRAMYCIN <=1 SENSITIVE Sensitive     TRIMETH/SULFA <=20 SENSITIVE Sensitive     PIP/TAZO <=4 SENSITIVE Sensitive     * CITROBACTER KOSERI   Klebsiella pneumoniae - MIC*    AMPICILLIN >=32 RESISTANT Resistant     CEFAZOLIN >=64 RESISTANT Resistant     CEFTRIAXONE RESISTANT      CIPROFLOXACIN >=4 RESISTANT Resistant     GENTAMICIN >=16 RESISTANT Resistant     LEVOFLOXACIN >=8 RESISTANT Resistant     NITROFURANTOIN 256 RESISTANT Resistant     TOBRAMYCIN 8 INTERMEDIATE Intermediate     TRIMETH/SULFA >=320 RESISTANT Resistant     IMIPENEM 0.5 SENSITIVE Sensitive     PIP/TAZO* 32 INTERMEDIATE Intermediate      * SET UP TIME:  [id number]    * KLEBSIELLA PNEUMONIAE  Blood culture (routine x 2)     Status: None (Preliminary result)   Collection Time: 07/10/14  8:52 AM  Result Value Ref Range Status   Specimen Description BLOOD LEFT ARM  Final   Special Requests BOTTLES DRAWN AEROBIC AND ANAEROBIC [employer] EACH  Final   Culture  Setup Time   Final    07/10/2014 18:48 Performed at [facility]    Culture   Final           BLOOD CULTURE RECEIVED NO GROWTH TO DATE CULTURE WILL BE HELD FOR 5 DAYS BEFORE ISSUING A FINAL NEGATIVE REPORT Performed at [facility]    Report Status PENDING  Incomplete  Blood culture (routine x 2)     Status: None (Preliminary result)   Collection Time: 07/10/14 10:40 AM  Result Value Ref Range Status   Specimen Description BLOOD LEFT  ANTECUBITAL  Final   Special Requests BOTTLES DRAWN AEROBIC AND ANAEROBIC 5CC EACH  Final   Culture  Setup Time   Final    07/10/2014 18:48 Performed at [facility]    Culture   Final           BLOOD CULTURE RECEIVED NO GROWTH TO DATE CULTURE WILL BE HELD FOR 5 DAYS BEFORE ISSUING A FINAL NEGATIVE REPORT Performed at [facility]    Report Status PENDING  Incomplete     Studies: No  results found.  Scheduled Meds: . amitriptyline  100 mg Oral QHS  . baclofen  40 mg Oral QID  . clonazePAM  1 mg Oral BID  . dantrolene  100 mg Oral TID  . docusate sodium  100 mg Oral TID  . enoxaparin (LOVENOX) injection  40 mg Subcutaneous Q24H  . ertapenem  1 g Intravenous Q24H  . feeding supplement (ENSURE COMPLETE)  237 mL Oral Q24H  . levothyroxine  100 mcg Oral QAC breakfast  . mirabegron ER  50 mg Oral Daily  . neomycin-bacitracin-polymyxin   Topical TID  . pantoprazole  40 mg Oral Daily  . perphenazine  2 mg Oral BID  . sertraline  200 mg Oral QHS  . tiZANidine  8 mg Oral QID   Continuous Infusions:    Marinda ElkFELIZ ORTIZ, ABRAHAM  [facility] Pager [phone]. If 8PM-8AM, please contact night-coverage at [email or web address], password [email or web address] 07/14/2014, 11:33 AM  LOS: 4 days

## 2014-07-16 LAB — CULTURE, BLOOD (ROUTINE X 2)
Culture: NO GROWTH
Culture: NO GROWTH

## 2014-07-20 ENCOUNTER — Other Ambulatory Visit: Payer: Self-pay | Admitting: Neurology

## 2014-07-20 DIAGNOSIS — G35 Multiple sclerosis: Secondary | ICD-10-CM

## 2014-07-22 ENCOUNTER — Encounter: Payer: Self-pay | Admitting: *Deleted

## 2014-07-22 NOTE — Progress Notes (Signed)
This encounter was created in error - please disregard.

## 2014-07-24 ENCOUNTER — Telehealth: Payer: Self-pay | Admitting: Neurology

## 2014-07-25 ENCOUNTER — Other Ambulatory Visit: Payer: Self-pay

## 2014-07-25 ENCOUNTER — Other Ambulatory Visit: Payer: Self-pay | Admitting: Neurology

## 2014-07-25 ENCOUNTER — Telehealth: Payer: Self-pay | Admitting: Neurology

## 2014-07-25 DIAGNOSIS — G35 Multiple sclerosis: Secondary | ICD-10-CM

## 2014-07-25 MED ORDER — HYDROCODONE-ACETAMINOPHEN 5-325 MG PO TABS: 1.0000 | ORAL_TABLET | Freq: Four times a day (QID) | ORAL | Status: DC | PRN

## 2014-07-25 NOTE — Telephone Encounter (Signed)
I called the patient and I had to leave a message to let them know their Rx for Hydrocodone was ready for pickup. Patient was instructed to bring Photo ID.

## 2014-07-25 NOTE — Telephone Encounter (Signed)
Patient calling to state that his caregiver Dyke Maes will be coming to pick up his prescription, advised Samuel [name] to have his ID with him to verify his identity.

## 2014-07-25 NOTE — Telephone Encounter (Signed)
[facility] requesting a new order for Tysabri be entered into [facility].  They relayed that since he was recently hospitalized his current orders that were up-to-date became voided.  His infustion is scheduled for tomorrow 07-26-14.

## 2014-07-25 NOTE — Telephone Encounter (Signed)
I will put in the orders for Tysabri.

## 2014-07-25 NOTE — Telephone Encounter (Signed)
07/25/14:  I called the patient and I had to leave a message to let them know their Rx for Hydrocodone was ready for pickup. Patient was instructed to bring Photo ID.

## 2014-07-25 NOTE — Telephone Encounter (Signed)
Per Phone note 12/04

## 2014-07-26 ENCOUNTER — Encounter (HOSPITAL_COMMUNITY)
Admission: RE | Admit: 2014-07-26 | Discharge: 2014-07-26 | Disposition: A | Discharge status: Home / Self Care | Payer: Medicare Other | Source: Ambulatory Visit | Attending: Neurology | Admitting: Neurology

## 2014-07-26 ENCOUNTER — Other Ambulatory Visit (HOSPITAL_COMMUNITY): Payer: Self-pay

## 2014-07-26 ENCOUNTER — Encounter (HOSPITAL_COMMUNITY): Payer: Self-pay

## 2014-07-26 ENCOUNTER — Other Ambulatory Visit: Payer: Self-pay | Admitting: Neurology

## 2014-07-26 VITALS — BP 113/73 | HR 82 | Temp 97.6°F | Resp 20

## 2014-07-26 DIAGNOSIS — G35 Multiple sclerosis: Secondary | ICD-10-CM | POA: Diagnosis not present

## 2014-07-26 MED ORDER — HEPARIN SOD (PORK) LOCK FLUSH 100 UNIT/ML IV SOLN
500.0000 [IU] | INTRAVENOUS | Status: DC
  Administered 2014-07-26: 500 [IU] via INTRAVENOUS
  Filled 2014-07-26: qty 5

## 2014-07-26 MED ORDER — LORATADINE 10 MG PO TABS
10.0000 mg | ORAL_TABLET | ORAL | Status: DC
  Administered 2014-07-26: 10 mg via ORAL
  Filled 2014-07-26: qty 1

## 2014-07-26 MED ORDER — SODIUM CHLORIDE 0.9 % IV SOLN
INTRAVENOUS | Status: DC
  Administered 2014-07-26: 11:00:00 via INTRAVENOUS

## 2014-07-26 MED ORDER — SODIUM CHLORIDE 0.9 % IV SOLN
300.0000 mg | INTRAVENOUS | Status: DC
  Administered 2014-07-26: 300 mg via INTRAVENOUS
  Filled 2014-07-26: qty 15

## 2014-07-26 MED ORDER — ACETAMINOPHEN 325 MG PO TABS
650.0000 mg | ORAL_TABLET | ORAL | Status: DC
  Administered 2014-07-26: 650 mg via ORAL
  Filled 2014-07-26: qty 2

## 2014-07-26 NOTE — Progress Notes (Signed)
Post- Infusion Tysabri, pt. Tolerated well, pt. Stayed 30 minutes post-infusion, pt. To follow-up with MD with any problems.

## 2014-07-26 NOTE — Discharge Instructions (Signed)

## 2014-07-26 NOTE — Progress Notes (Signed)
Pt. Here for Tysabri infusion, Pt. Stated "I just finished my antibiotics on 11/19 for my UTI", Dr. Anne HahnWillis notified, VSS,  Ok to infuse Tysabri per Dr. Anne HahnWillis.

## 2014-08-23 ENCOUNTER — Encounter (HOSPITAL_COMMUNITY)
Admission: RE | Admit: 2014-08-23 | Discharge: 2014-08-23 | Disposition: A | Discharge status: Home / Self Care | Payer: Medicare Other | Source: Ambulatory Visit | Attending: Neurology | Admitting: Neurology

## 2014-08-23 ENCOUNTER — Encounter (HOSPITAL_COMMUNITY): Payer: Self-pay

## 2014-08-23 VITALS — BP 118/74 | HR 80 | Temp 97.7°F | Resp 20 | Ht 73.0 in

## 2014-08-23 DIAGNOSIS — G35 Multiple sclerosis: Secondary | ICD-10-CM | POA: Insufficient documentation

## 2014-08-23 MED ORDER — SODIUM CHLORIDE 0.9 % IV SOLN
INTRAVENOUS | Status: DC
  Administered 2014-08-23: 13:00:00 via INTRAVENOUS

## 2014-08-23 MED ORDER — LORATADINE 10 MG PO TABS
10.0000 mg | ORAL_TABLET | ORAL | Status: DC
  Administered 2014-08-23: 10 mg via ORAL
  Filled 2014-08-23: qty 1

## 2014-08-23 MED ORDER — ACETAMINOPHEN 325 MG PO TABS
650.0000 mg | ORAL_TABLET | ORAL | Status: DC
  Administered 2014-08-23: 650 mg via ORAL
  Filled 2014-08-23: qty 2

## 2014-08-23 MED ORDER — SODIUM CHLORIDE 0.9 % IV SOLN
300.0000 mg | INTRAVENOUS | Status: DC
  Administered 2014-08-23: 300 mg via INTRAVENOUS
  Filled 2014-08-23: qty 15

## 2014-08-23 NOTE — Discharge Instructions (Signed)

## 2014-08-26 ENCOUNTER — Other Ambulatory Visit: Payer: Self-pay | Admitting: Neurology

## 2014-08-29 ENCOUNTER — Other Ambulatory Visit: Payer: Self-pay

## 2014-08-29 MED ORDER — DIAZEPAM 10 MG PO TABS: 10.0000 mg | ORAL_TABLET | Freq: Every evening | ORAL | Status: DC | PRN

## 2014-08-29 NOTE — Telephone Encounter (Signed)
Rx signed and faxed.

## 2014-08-30 ENCOUNTER — Other Ambulatory Visit: Payer: Self-pay | Admitting: Neurology

## 2014-09-05 ENCOUNTER — Telehealth: Payer: Self-pay | Admitting: Neurology

## 2014-09-05 DIAGNOSIS — G8250 Quadriplegia, unspecified: Secondary | ICD-10-CM

## 2014-09-05 DIAGNOSIS — G35 Multiple sclerosis: Secondary | ICD-10-CM

## 2014-09-05 NOTE — Telephone Encounter (Signed)
I called the mother. The patient needs assistance at home with bathing. The mother is 54 years old, she has difficulty managing him, he requires a hoyer lift to get a minimum now the bathtub. I will try to get somebody out at least 3 times a week.

## 2014-09-05 NOTE — Telephone Encounter (Signed)
Left message for patient's mother relaying that if she needs assistance with bathing and not skilled nursing insurance will probably not cover.  I will for to doctor for his recommendation.

## 2014-09-05 NOTE — Telephone Encounter (Signed)
Pt's mother is calling wanting to know how she can get some assistance in someone coming out and help bathe the patient.  She states that Dr. Anne Hahn had mentioned this to her before.  Please call and advise.

## 2014-09-12 ENCOUNTER — Telehealth: Payer: Self-pay | Admitting: Neurology

## 2014-09-12 NOTE — Telephone Encounter (Signed)
I called the patient. The patient indicates that over the last 3 injections of Tysabri, he is felt worse. He has lost use of his left hand suddenly during a Tysabri infusion. He has chronic progressive illness, I'm not sure that coming off of the Tysabri will adversely affect his disease process. We will plan on stopping the Tysabri this time.

## 2014-09-12 NOTE — Telephone Encounter (Signed)
Pt is calling to see if it is still ok for him to stop the Tysabri. He states that Dr. Anne Hahn had suggested this to him and now he feels that it is causing him some mobility problems. Please call and advise.

## 2014-09-13 NOTE — Telephone Encounter (Signed)
Spoke to [facility] and relayed that the patient will no longer be receiving Tysabri.  They asked that I let the Touch program know also.

## 2014-09-20 ENCOUNTER — Encounter (HOSPITAL_COMMUNITY): Payer: Medicare Other

## 2014-09-30 NOTE — Telephone Encounter (Addendum)
Spoke to [employer] with [employer], Touch program and relayed the doctor has discontinued Tysabri for the patient.  She will forward the necessary paperwork.  Also spoke to patient and relayed the above information.

## 2014-10-03 ENCOUNTER — Telehealth: Payer: Self-pay | Admitting: *Deleted

## 2014-10-03 NOTE — Telephone Encounter (Signed)
Daughter was just calling wanting to know what to do with patient disability forms that need to be completed. Patient will drop them off to the office.

## 2014-10-04 ENCOUNTER — Telehealth: Payer: Self-pay | Admitting: *Deleted

## 2014-10-04 NOTE — Telephone Encounter (Signed)
Form,[employer] Parking Placard to Nurse [name] and [name] to be completed 10-04-14.

## 2014-10-04 NOTE — Telephone Encounter (Signed)
Form,[employer] to Nurse [name] and [name] to be completed 10-04-14.

## 2014-10-05 DIAGNOSIS — Z0289 Encounter for other administrative examinations: Secondary | ICD-10-CM

## 2014-10-06 ENCOUNTER — Other Ambulatory Visit: Payer: Self-pay | Admitting: Neurology

## 2014-10-06 ENCOUNTER — Other Ambulatory Visit: Payer: Self-pay | Admitting: *Deleted

## 2014-10-06 MED ORDER — HYDROCODONE-ACETAMINOPHEN 5-325 MG PO TABS: 1.0000 | ORAL_TABLET | Freq: Four times a day (QID) | ORAL | Status: DC | PRN

## 2014-10-06 NOTE — Telephone Encounter (Deleted)
Care taker calling for hydrocodone refill.   Mother, sister or caretaker to pick up.

## 2014-10-06 NOTE — Telephone Encounter (Signed)
I will complete the form when I receive it 

## 2014-10-06 NOTE — Telephone Encounter (Signed)
Request entered, forwarded to provider for approval.  

## 2014-10-06 NOTE — Telephone Encounter (Signed)
Pt needing refill on hydrocodone

## 2014-10-07 ENCOUNTER — Telehealth: Payer: Self-pay

## 2014-10-07 NOTE — Telephone Encounter (Signed)
Called patient and informed Rx ready for pick up at front desk. Patient verbalized understanding.  

## 2014-10-08 ENCOUNTER — Other Ambulatory Visit: Payer: Self-pay | Admitting: Neurology

## 2014-10-11 ENCOUNTER — Telehealth: Payer: Self-pay | Admitting: Neurology

## 2014-10-11 DIAGNOSIS — G35 Multiple sclerosis: Secondary | ICD-10-CM

## 2014-10-11 NOTE — Telephone Encounter (Signed)
I called the patient. He has had problems with flexion of the right arm, and difficulty opening up both hands, he may be a candidate for Botox injections. 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

## 2014-10-11 NOTE — Telephone Encounter (Signed)
Pt is calling stating that he is interested in starting Botox.  Please call and advise what needs to be done to get set up.

## 2014-10-12 ENCOUNTER — Other Ambulatory Visit: Payer: Self-pay | Admitting: Neurology

## 2014-10-18 ENCOUNTER — Encounter (HOSPITAL_COMMUNITY): Payer: Medicare Other

## 2014-10-20 ENCOUNTER — Telehealth: Payer: Self-pay | Admitting: *Deleted

## 2014-10-20 NOTE — Telephone Encounter (Signed)
Form,[employer] received,completed by [name] and Nurse [name] faxed,mailed copy to patient 10-20-14.

## 2014-10-20 NOTE — Telephone Encounter (Signed)
Form,[employer] Parking Placard received,completed by [name] and Nurse [name] mailed to patient 10-20-14.

## 2014-10-20 NOTE — Telephone Encounter (Signed)
Form,[employer] received,completed by [name] and Nurse [name] faxed and mailed copy to patient 10-20-14.

## 2014-10-23 ENCOUNTER — Other Ambulatory Visit: Payer: Self-pay | Admitting: Neurology

## 2014-10-25 ENCOUNTER — Other Ambulatory Visit: Payer: Self-pay | Admitting: Neurology

## 2014-10-25 ENCOUNTER — Other Ambulatory Visit: Payer: Self-pay

## 2014-10-25 MED ORDER — CLONAZEPAM 1 MG PO TABS: 1.0000 mg | ORAL_TABLET | Freq: Two times a day (BID) | ORAL | Status: DC

## 2014-10-25 NOTE — Telephone Encounter (Signed)
Rx signed and faxed.

## 2014-10-31 ENCOUNTER — Encounter: Payer: Self-pay | Admitting: Neurology

## 2014-10-31 ENCOUNTER — Ambulatory Visit (INDEPENDENT_AMBULATORY_CARE_PROVIDER_SITE_OTHER): Payer: Medicare Other | Admitting: Neurology

## 2014-10-31 VITALS — BP 152/94 | HR 94

## 2014-10-31 DIAGNOSIS — G8389 Other specified paralytic syndromes: Secondary | ICD-10-CM

## 2014-10-31 DIAGNOSIS — G8250 Quadriplegia, unspecified: Secondary | ICD-10-CM

## 2014-10-31 DIAGNOSIS — G35 Multiple sclerosis: Secondary | ICD-10-CM | POA: Diagnosis not present

## 2014-10-31 NOTE — Progress Notes (Signed)
PATIENT: Samuel [name] DOB: 28-Oct-1960  HISTORICAL  Samuel [name] is 54 yo RH male is accompanied by his care giver Samuel [name], referred by his neurologist Dr. Anne Hahn for evaluation of potential EMG guided Botox injection for his spastic bilateral upper extremity  He was under the care of Dr. Anne Hahn for few years, with a diagnosis of secondary progressive multiple sclerosis, he was previously treated with Silvestre Moment, last infusion was December 2015, he complains of increased left upper extremity after IV infusion through his left arm, there was no worsening of his symptoms after stopping Tysarbri,  Diagnosis of MS was in 1993, he presented with right optic neuritis in 1986, only had partial recovery, later on, he had relapsing episode, presented with progressive working difficulty, urinary urgency, retention, rapid worsening since 2002, eventually become wheelchair bound in 2014, he used to operate his wheelchair using his right hand, which has gradual worsening spasticity, loosing function, now he has to reach over with his left hand, to operate his wheelchair, which is getting more difficulty, because increased left hand muscle spasm, and weakness,  He had supraorbital catheter since November 2015, but sometimes, he get severe bladder muscle spasm, urinary incontinence still, using condom catheter, he needs his caregiver to get in and out of wheelchair, chronic constipation, no use of bilateral lower extremity.  He is on polypharmacy treatment for his muscle spasticity, this includes Dantrolene, clonazepam, baclofen, Valium, tizanidine, tramadol,  He hoped to preserve the use of his left hand, which he used to feed himself, and operate his wheelchair   REVIEW OF SYSTEMS: Full 14 system review of systems performed and notable only for as above  ALLERGIES: Allergies  Allergen Reactions  . Penicillins Other (See Comments)    Unknown childhoood reaction (takes amoxicillian with  reaction) Has tolerated ceftriaxone, cefepime, and Primaxin during inpatient admissions    HOME MEDICATIONS: Current Outpatient Prescriptions  Medication Sig Dispense Refill  . amitriptyline (ELAVIL) 100 MG tablet take 1 tablet by mouth at bedtime 90 tablet 1  . baclofen (LIORESAL) 20 MG tablet take 2 tablets by mouth three times a day 540 tablet 1  . clonazePAM (KLONOPIN) 1 MG tablet Take 1 tablet (1 mg total) by mouth 2 (two) times daily. 60 tablet 3  . clotrimazole-betamethasone (LOTRISONE) cream Apply 1 application topically 2 (two) times daily as needed (for skin irritations).     . Cranberry-Vitamin C-Probiotic (AZO CRANBERRY PO) Take 1 capsule by mouth 2 (two) times daily.     . dantrolene (DANTRIUM) 100 MG capsule Take 100 mg by mouth 3 (three) times daily.    . dantrolene (DANTRIUM) 100 MG capsule take 1 capsule by mouth three times a day 90 capsule 3  . dantrolene (DANTRIUM) 100 MG capsule take 1 capsule by mouth three times a day 90 capsule 3  . diazepam (VALIUM) 10 MG tablet Take 1 tablet (10 mg total) by mouth at bedtime as needed. 30 tablet 5  . docusate sodium (COLACE) 100 MG capsule Take 100 mg by mouth 3 (three) times daily.    . ertapenem 1 g in sodium chloride 0.9 % 50 mL Inject 1 g into the vein daily. 7 Bottle 0  . HYDROcodone-acetaminophen (NORCO/VICODIN) 5-325 MG per tablet Take 1-2 tablets by mouth every 6 (six) hours as needed. 60 tablet 0  . ibuprofen (ADVIL,MOTRIN) 200 MG tablet Take 600 mg by mouth as needed for fever or moderate pain.     Marland Kitchen levothyroxine (SYNTHROID, LEVOTHROID) 100 MCG  tablet Take 100 mcg by mouth daily before breakfast.     . lisinopril-hydrochlorothiazide (PRINZIDE,ZESTORETIC) 10-12.5 MG per tablet Take 0.5 tablets by mouth every morning.     . mirabegron ER (MYRBETRIQ) 50 MG TB24 tablet Take 50 mg by mouth daily.    Marland Kitchen omeprazole (PRILOSEC) 20 MG capsule Take 20 mg by mouth every morning.     Marland Kitchen perphenazine (TRILAFON) 2 MG tablet take 1 tablet  by mouth twice a day (Patient not taking: Reported on 07/10/2014) 60 tablet 6  . perphenazine (TRILAFON) 2 MG tablet Take 2 mg by mouth 2 (two) times daily.    . sertraline (ZOLOFT) 100 MG tablet Take 200 mg by mouth at bedtime.     . solifenacin (VESICARE) 10 MG tablet Take 10 mg by mouth daily.    Marland Kitchen terazosin (HYTRIN) 10 MG capsule Take 10 mg by mouth at bedtime.     Marland Kitchen tiZANidine (ZANAFLEX) 4 MG capsule Take 8 mg by mouth 3 (three) times daily.    Marland Kitchen tiZANidine (ZANAFLEX) 4 MG tablet take 1 tablet by mouth every 4 hours if needed (Patient not taking: Reported on 07/10/2014) 180 tablet 6  . traMADol (ULTRAM) 50 MG tablet Take 1 tablet (50 mg total) by mouth every 6 (six) hours as needed. (Patient taking differently: Take 50 mg by mouth every 6 (six) hours as needed for moderate pain. ) 60 tablet 3  . Vitamin D, Ergocalciferol, (DRISDOL) 50000 UNITS CAPS Take 50,000 Units by mouth every 7 (seven) days. Friday only       PAST MEDICAL HISTORY: Past Medical History  Diagnosis Date  . Hypertension   . GERD (gastroesophageal reflux disease)   . Hypothyroidism   . Vitamin D deficiency   . Chronic back pain   . Gait disorder   . 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 Multiple sclerosis NEUROLOGIST-- [name]    dx 1993, JC virus negative 2/14---  CURRENTLY RECEIVING TYSABRI IV TREATMENT  . Pneumonia     dx  03-03-2014--  admitted for iv and oral antibiotics  . Urinary retention   . Neurogenic bladder   . Foley catheter in place   . Incontinence of urine   . Hyperlipidemia   . Bladder calculi   . Shortness of breath     secondary to fatique  . Chronic fatigue   . Neurogenic bowel     intermittant constipation and diarrhea  . Yeast infection     genital area    PAST SURGICAL HISTORY: Past Surgical History  Procedure Laterality Date  . Appendectomy  age 73  . Nasal septum surgery  1996  . Negative sleep study  1996  . Transthoracic echocardiogram  02-07-2004    MILD LVH/  EF 55-65%   . Insertion of suprapubic catheter N/A 03/21/2014    Procedure: INSERTION OF SUPRAPUBIC CATHETER;  Surgeon: [name], MD;  Location: [facility];  Service: Urology;  Laterality: N/A;  . Cystoscopy N/A 03/21/2014    Procedure: CYSTOSCOPY TREATMENT OF BLADDER CALCULI;  Surgeon: [name], MD;  Location: [facility];  Service: Urology;  Laterality: N/A;    FAMILY HISTORY: Family History  Problem Relation Age of Onset  . Stroke Father   . Alcoholism Brother     SOCIAL HISTORY:  History   Social History  . Marital Status: Divorced    Spouse Name: N/A  . Number of Children: 0  . Years of Education: college   Occupational History  .  Curator     disability   Social History Main Topics  . Smoking status: Former Smoker -- 2.00 packs/day for 8 years    Types: Cigarettes    Quit date: 06/17/1985  . Smokeless tobacco: Never Used  . Alcohol Use: No  . Drug Use: No  . Sexual Activity: Not on file   Other Topics Concern  . Not on file   Social History Narrative     PHYSICAL EXAM   Filed Vitals:   10/31/14 0923  BP: 152/94  Pulse: 94    Not recorded      Cannot calculate BMI with a height equal to zero.  PHYSICAL EXAMNIATION:  Gen: NAD, conversant, well nourised, obese, well groomed                     Cardiovascular: Regular rate rhythm, no peripheral edema, warm, nontender. Eyes: Conjunctivae clear without exudates or hemorrhage Neck: Supple, no carotid bruise. Pulmonary: Clear to auscultation bilaterally   NEUROLOGICAL EXAM:  MENTAL STATUS: Speech: Sitting in wheelchair, mild slow spastic speech, provide good history  Cognition:    The patient is oriented to person, place, and time;     recent and remote memory intact;     language fluent;     normal attention, concentration,     fund of knowledge.  CRANIAL NERVES: CN II: Pupils were equal round reactive to light  CN III, IV, ZO:[id number] INO,  CN V: Facial  sensation is intact to pinprick in all 3 divisions bilaterally. Corneal responses are intact.  CN VII: Face is symmetric with normal eye closure and smile. CN VIII: Hearing is normal to rubbing fingers CN IX, X: Palate elevates symmetrically. Phonation is normal. CN XI: Head turning and shoulder shrug are intact CN XII: Tongue is midline with normal movements and no atrophy.  MOTOR: He has no significant movement of bilateral lower extremity, fixed knee flexion, significant right upper extremity spasticity, antigravity movement of right shoulder, elbow pronation flexion, wrist flexion, finger flexion, weak grip 2 out of 5, He has mild spasticity of left upper extremity, left elbow abduction 4, external rotation 4, elbow flexion 4, extension 4 minus, wrist flexion 4, wrist extension 3, grip 3 plus   REFLEXES: Reflexes are 2+ and symmetric at the biceps, triceps, knees, and ankles  SENSORY: Decreased vibratory, pinprick at bilateral lower extremity,  COORDINATION: Dysmetric of left upper extremity  GAIT/STANCE:Deferred  DIAGNOSTIC DATA (LABS, IMAGING, TESTING) - I reviewed patient records, labs, notes, testing and imaging myself where available.  Lab Results  Component Value Date   WBC 5.4 07/13/2014   HGB 10.1* 07/13/2014   HCT 29.8* 07/13/2014   MCV 87.4 07/13/2014   PLT 134* 07/13/2014      Component Value Date/Time   NA 144 07/13/2014 0419   NA 137 05/13/2014 1532   K 3.5* 07/13/2014 0419   CL 107 07/13/2014 0419   CO2 25 07/13/2014 0419   GLUCOSE 88 07/13/2014 0419   GLUCOSE 112* 05/13/2014 1532   BUN 5* 07/13/2014 0419   BUN 12 05/13/2014 1532   CREATININE 0.51 07/13/2014 0419   CALCIUM 9.0 07/13/2014 0419   PROT 6.4 07/11/2014 0700   PROT 7.1 05/13/2014 1532   ALBUMIN 3.3* 07/11/2014 0700   AST 15 07/11/2014 0700   ALT 12 07/11/2014 0700   ALKPHOS 74 07/11/2014 0700   BILITOT 0.4 07/11/2014 0700   GFRNONAA >90 07/13/2014 0419   GFRAA >90 07/13/2014 0419  ASSESSMENT AND PLAN  Samuel [name] is a 54 y.o. male with secondary progressive MS, wheelchair bound, spastic quadriplegia, no movement of bilateral lower extremity, severe spasticity of right upper extremity, functional left hand, but increased spasticity, weakness, hope to receive EMG guided Botox injection to preserve his left hand function,  Preauthorization process   Return in about 1 month (around 11/29/2014). Levert Feinstein, M.D. Ph.D.  [facility] [address] [place], [place] [address] Ph: [phone] Fax: [phone]

## 2014-11-09 ENCOUNTER — Telehealth: Payer: Self-pay | Admitting: *Deleted

## 2014-11-09 ENCOUNTER — Telehealth: Payer: Self-pay | Admitting: Neurology

## 2014-11-09 NOTE — Telephone Encounter (Signed)
I called the patient. I will see him next Monday.

## 2014-11-09 NOTE — Telephone Encounter (Signed)
I tried to call the patient, the home phone has been disconnected, unable to leave a message on the cell phone. I will try calling back later. I have not seen him since September 2015, I would want to see him tomorrow.

## 2014-11-09 NOTE — Telephone Encounter (Signed)
Patient returned call and I relayed phone note stating Dr. Anne Hahn would like to see patient on 11/10/14.  Patient stated he would prefer to keep appointment with Dr. Anne Hahn on 3/14 due to transportation issues.  Requested a return call to [phone] and stated phone should be working.

## 2014-11-09 NOTE — Telephone Encounter (Signed)
Patient calling wanting to know if Dr. Anne Hahn still wants to see him on Monday. Patient phone is acting up so if the call does not go through patient will call the office back to see. Please put a note in system stating if he needs to come or not.  Thank you

## 2014-11-09 NOTE — Telephone Encounter (Signed)
ERROR

## 2014-11-14 ENCOUNTER — Ambulatory Visit (INDEPENDENT_AMBULATORY_CARE_PROVIDER_SITE_OTHER): Payer: Medicare Other | Admitting: Neurology

## 2014-11-14 ENCOUNTER — Encounter: Payer: Self-pay | Admitting: Neurology

## 2014-11-14 VITALS — BP 127/93 | HR 117

## 2014-11-14 DIAGNOSIS — N319 Neuromuscular dysfunction of bladder, unspecified: Secondary | ICD-10-CM | POA: Diagnosis not present

## 2014-11-14 DIAGNOSIS — G8389 Other specified paralytic syndromes: Secondary | ICD-10-CM

## 2014-11-14 DIAGNOSIS — G35 Multiple sclerosis: Secondary | ICD-10-CM | POA: Diagnosis not present

## 2014-11-14 DIAGNOSIS — Z5181 Encounter for therapeutic drug level monitoring: Secondary | ICD-10-CM

## 2014-11-14 DIAGNOSIS — G8250 Quadriplegia, unspecified: Secondary | ICD-10-CM

## 2014-11-14 MED ORDER — HYDROCODONE-ACETAMINOPHEN 5-325 MG PO TABS: 1.0000 | ORAL_TABLET | Freq: Four times a day (QID) | ORAL | Status: DC | PRN

## 2014-11-14 MED ORDER — CLONAZEPAM 1 MG PO TABS: 1.0000 mg | ORAL_TABLET | Freq: Two times a day (BID) | ORAL | Status: DC | PRN

## 2014-11-14 MED ORDER — TRAMADOL HCL 50 MG PO TABS: 50.0000 mg | ORAL_TABLET | Freq: Four times a day (QID) | ORAL | Status: DC | PRN

## 2014-11-14 MED ORDER — ALPRAZOLAM 1 MG PO TABS: 1.0000 mg | ORAL_TABLET | Freq: Every evening | ORAL | Status: DC | PRN

## 2014-11-14 NOTE — Patient Instructions (Signed)

## 2014-11-14 NOTE — Progress Notes (Signed)
Reason for visit: Multiple sclerosis  Samuel [name] is an 54 y.o. male  History of present illness:  Samuel [name] is a 54 year old right-handed white male with a history of multiple sclerosis with a spastic quadriparesis. The patient has chronic progressive multiple sclerosis, recently he has come off of Tysabri. He indicates that he developed rapid onset of dysfunction of the left arm and hand in December 2015, and he stopped the Tysabri after that point. The patient has had increased discomfort in the lower extremities off of Tysabri. He has not had any overt MS attacks, however. The patient is having ongoing spasticity in the legs at night associated with some painful spasms of the legs and bladder spasms. The patient has had a suprapubic catheter placement. He has a caretaker that helps maintain him in the home environment. The patient is not ambulatory, he has to be transferred from the bed to the chair using a Hoyer lift. The patient reports that he has heat and sweats of his lower body at night, coldness and chills in his upper body at night. The spasm of the legs continues to be a problem. He is having some issues with anxiety as well in the evening hours. He is on clonazepam taking 1 mg twice daily, he has diazepam 10 mg take at night if needed. He returns to this office for an evaluation.  Past Medical History  Diagnosis Date  . Hypertension   . GERD (gastroesophageal reflux disease)   . Hypothyroidism   . Vitamin D deficiency   . Chronic back pain   . Gait disorder   . 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 Multiple sclerosis NEUROLOGIST-- [name]    dx 1993, JC virus negative 2/14---  CURRENTLY RECEIVING TYSABRI IV TREATMENT  . Pneumonia     dx  03-03-2014--  admitted for iv and oral antibiotics  . Urinary retention   . Neurogenic bladder   . Foley catheter in place   . Incontinence of urine   . Hyperlipidemia   . Bladder calculi   . Shortness of breath     secondary to  fatique  . Chronic fatigue   . Neurogenic bowel     intermittant constipation and diarrhea  . Yeast infection     genital area    Past Surgical History  Procedure Laterality Date  . Appendectomy  age 68  . Nasal septum surgery  1996  . Negative sleep study  1996  . Transthoracic echocardiogram  02-07-2004    MILD LVH/  EF 55-65%  . Insertion of suprapubic catheter N/A 03/21/2014    Procedure: INSERTION OF SUPRAPUBIC CATHETER;  Surgeon: [name], MD;  Location: [facility];  Service: Urology;  Laterality: N/A;  . Cystoscopy N/A 03/21/2014    Procedure: CYSTOSCOPY TREATMENT OF BLADDER CALCULI;  Surgeon: [name], MD;  Location: [facility];  Service: Urology;  Laterality: N/A;    Family History  Problem Relation Age of Onset  . Stroke Father   . Alcoholism Brother     Social history:  reports that he quit smoking about 29 years ago. His smoking use included Cigarettes. He has a 16 pack-year smoking history. He has never used smokeless tobacco. He reports that he does not drink alcohol or use illicit drugs.    Allergies  Allergen Reactions  . Penicillins Other (See Comments)    Unknown childhoood reaction (takes amoxicillian with reaction) Has tolerated ceftriaxone, cefepime, and Primaxin during inpatient  admissions    Medications:  Prior to Admission medications   Medication Sig Start Date End Date Taking? Authorizing Provider  amitriptyline (ELAVIL) 100 MG tablet take 1 tablet by mouth at bedtime 07/25/14  Yes York Spaniel, MD  baclofen (LIORESAL) 20 MG tablet take 2 tablets by mouth three times a day 10/07/14  Yes York Spaniel, MD  clonazePAM (KLONOPIN) 1 MG tablet Take 1 tablet (1 mg total) by mouth 2 (two) times daily. 10/25/14  Yes York Spaniel, MD  dantrolene (DANTRIUM) 100 MG capsule Take 100 mg by mouth 3 (three) times daily.   Yes Historical Provider, MD  diazepam (VALIUM) 10 MG tablet Take 1 tablet (10 mg total) by  mouth at bedtime as needed. 08/29/14  Yes York Spaniel, MD  docusate sodium (COLACE) 100 MG capsule Take 100 mg by mouth 2 (two) times daily.    Yes Historical Provider, MD  ertapenem 1 g in sodium chloride 0.9 % 50 mL Inject 1 g into the vein daily. 07/14/14  Yes Marinda Elk, MD  HYDROcodone-acetaminophen (NORCO/VICODIN) 5-325 MG per tablet Take 1-2 tablets by mouth every 6 (six) hours as needed. 10/06/14  Yes York Spaniel, MD  ibuprofen (ADVIL,MOTRIN) 200 MG tablet Take 600 mg by mouth as needed for fever or moderate pain.    Yes Historical Provider, MD  levothyroxine (SYNTHROID, LEVOTHROID) 100 MCG tablet Take 100 mcg by mouth daily before breakfast.    Yes Historical Provider, MD  lisinopril-hydrochlorothiazide (PRINZIDE,ZESTORETIC) 10-12.5 MG per tablet Take 0.5 tablets by mouth every morning.    Yes Historical Provider, MD  mirabegron ER (MYRBETRIQ) 50 MG TB24 tablet Take 50 mg by mouth daily.   Yes Historical Provider, MD  omeprazole (PRILOSEC) 20 MG capsule Take 20 mg by mouth every morning.    Yes Historical Provider, MD  perphenazine (TRILAFON) 2 MG tablet Take 2 mg by mouth 2 (two) times daily.   Yes Historical Provider, MD  Phenazopyridine HCl (AZO TABS PO) Take by mouth 2 (two) times daily.   Yes Historical Provider, MD  sertraline (ZOLOFT) 100 MG tablet Take 200 mg by mouth at bedtime.    Yes Historical Provider, MD  solifenacin (VESICARE) 10 MG tablet Take 10 mg by mouth daily.   Yes Historical Provider, MD  terazosin (HYTRIN) 10 MG capsule Take 10 mg by mouth at bedtime.    Yes Historical Provider, MD  tiZANidine (ZANAFLEX) 4 MG capsule Take 8 mg by mouth 3 (three) times daily.   Yes Historical Provider, MD  traMADol (ULTRAM) 50 MG tablet Take 1 tablet (50 mg total) by mouth every 6 (six) hours as needed. Patient taking differently: Take 50 mg by mouth every 6 (six) hours as needed for moderate pain.  05/13/14  Yes York Spaniel, MD  Vitamin D, Ergocalciferol,  (DRISDOL) 50000 UNITS CAPS Take 50,000 Units by mouth every 7 (seven) days. Friday only   Yes Historical Provider, MD    ROS:  Out of a complete 14 system review of symptoms, the patient complains only of the following symptoms, and all other reviewed systems are negative.  Chills, fatigue, excessive sweating Eye discharge, light sensitivity, double vision Shortness of breath Leg swelling Cold and heat intolerance Urgency of the bladder Constipation, diarrhea, nausea Restless legs, frequent waking, snoring Joint pain, back pain, aching muscles, muscle cramps, neck stiffness Moles, itching Memory loss, dizziness, headache, numbness, weakness, tremors Agitation, confusion, decreased concentration, depression, anxiety  Blood pressure 127/93, pulse 117, weight 0  lb (0 kg).  Physical Exam  General: The patient is alert and cooperative at the time of the examination.  Skin: 1+ edema at the ankles is noted.   Neurologic Exam  Mental status: The patient is alert and oriented x 3 at the time of the examination. The patient has apparent normal recent and remote memory, with an apparently normal attention span and concentration ability.   Cranial nerves: Facial symmetry is present. Speech is normal, no aphasia or dysarthria is noted. Extraocular movements are notable for bilateral, right greater than left, INO. Visual fields are full. Pupils are equal, round, and reactive to light. Discs are flat bilaterally.  Motor: The patient has flexion at the elbows bilaterally, right greater than left. The patient has 4/5 strength with grip on the left hand, and elbow flexion extension is 4/5 strength. The patient has 2/5 strength with elbow flexion extension on the right. The patient has no voluntary movement of either lower extremity. Motor tone of the lower extremities appears to be actually decreased.  Sensory examination: Soft touch sensation is symmetric on the face, arms, and  legs.  Coordination: The patient has the ability to do finger-nose-finger with the left arm, not the right. He cannot perform heel-to-shin on either side.  Gait and station: The patient is wheelchair-bound, unable to ambulate.  Reflexes: Deep tendon reflexes are symmetric, but are depressed.   Assessment/Plan:  1. Multiple sclerosis, spastic quadriparesis  2. Neurogenic bladder  3. Anxiety and depression  4. Gait disorder  The patient is having ongoing problems with anxiety and depression. The patient will be taken off of the diazepam and given alprazolam to help him better with sleep and with anxiety in the evening hours. The patient will continue the clonazepam on a scheduled basis. I have once again suggested the possibility of a baclofen pump to help his spasticity in the legs, and possibly to be able to get him off of baclofen orally and the Dantrium. The patient may eventually get Botox injections in the left hand and forearm for spasticity to help increase function of that arm. He was given refills on Ultram and hydrocodone today. The patient will follow-up in this office in about 6 months, blood work will be done today as the patient is on Dantrium, need to follow the liver enzymes.  Marlan Palau MD 11/14/2014 7:27 PM  [facility] [address] [place], [place] [address]  Phone [phone] Fax [phone]

## 2014-11-15 ENCOUNTER — Telehealth: Payer: Self-pay | Admitting: Neurology

## 2014-11-15 LAB — CBC WITH DIFFERENTIAL/PLATELET
Basophils Absolute: 0.1 10*3/uL (ref 0.0–0.2)
Basos: 0 %
Eos: 1 %
Eosinophils Absolute: 0.2 10*3/uL (ref 0.0–0.4)
HCT: 51.4 % — ABNORMAL HIGH (ref 37.5–51.0)
Hemoglobin: 17.4 g/dL (ref 12.6–17.7)
Immature Grans (Abs): 0 10*3/uL (ref 0.0–0.1)
Immature Granulocytes: 0 %
Lymphocytes Absolute: 2.3 10*3/uL (ref 0.7–3.1)
Lymphs: 16 %
MCH: 29.6 pg (ref 26.6–33.0)
MCHC: 33.9 g/dL (ref 31.5–35.7)
MCV: 88 fL (ref 79–97)
Monocytes Absolute: 0.9 10*3/uL (ref 0.1–0.9)
Monocytes: 6 %
Neutrophils Absolute: 10.9 10*3/uL — ABNORMAL HIGH (ref 1.4–7.0)
Neutrophils Relative %: 77 %
Platelets: 302 10*3/uL (ref 150–379)
RBC: 5.87 x10E6/uL — ABNORMAL HIGH (ref 4.14–5.80)
RDW: 13.4 % (ref 12.3–15.4)
WBC: 14.3 10*3/uL — ABNORMAL HIGH (ref 3.4–10.8)

## 2014-11-15 LAB — COMPREHENSIVE METABOLIC PANEL
ALT: 17 IU/L (ref 0–44)
AST: 17 IU/L (ref 0–40)
Albumin/Globulin Ratio: 1.8 (ref 1.1–2.5)
Albumin: 4.7 g/dL (ref 3.5–5.5)
Alkaline Phosphatase: 120 IU/L — ABNORMAL HIGH (ref 39–117)
BUN/Creatinine Ratio: 18 (ref 9–20)
BUN: 11 mg/dL (ref 6–24)
Bilirubin Total: <0.2 mg/dL (ref 0.0–1.2)
CO2: 18 mmol/L (ref 18–29)
Calcium: 9.9 mg/dL (ref 8.7–10.2)
Chloride: 97 mmol/L (ref 97–108)
Creatinine, Ser: 0.61 mg/dL — ABNORMAL LOW (ref 0.76–1.27)
GFR calc Af Amer: 131 mL/min/{1.73_m2} (ref >59)
GFR calc non Af Amer: 113 mL/min/{1.73_m2} (ref >59)
Globulin, Total: 2.6 g/dL (ref 1.5–4.5)
Glucose: 163 mg/dL — ABNORMAL HIGH (ref 65–99)
Potassium: 3.7 mmol/L (ref 3.5–5.2)
Sodium: 137 mmol/L (ref 134–144)
Total Protein: 7.3 g/dL (ref 6.0–8.5)

## 2014-11-15 NOTE — Telephone Encounter (Signed)
I called patient. The blood work reveals an elevated white blood count, the patient in case that he has not been running fevers. 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 He is to check his temperatures over the next several days, if he begins to run low-grade temperatures, we may need to consider antibiotic therapy, potentially getting a urine culture and a chest x-ray. Otherwise, the blood was unremarkable with exception of a minimal elevation in the alkaline phosphatase patient level. The anemia that was present earlier has resolved. The patient is not on steroids.

## 2014-12-09 ENCOUNTER — Telehealth: Payer: Self-pay | Admitting: Neurology

## 2014-12-09 NOTE — Telephone Encounter (Addendum)
Patient received his first  electrical stimulation guided Botox a injection for his spastic upper extremity

## 2014-12-13 ENCOUNTER — Telehealth: Payer: Self-pay | Admitting: Neurology

## 2014-12-13 NOTE — Telephone Encounter (Signed)
Patient's sister Audry Riles @ [phone] requesting [employer] do a home bound visit due to advanced MS in order for patient to have state ID.  [employer] requesting a letter stating pt's condition and diagnosis.   Please fax letter to [phone] or e mail Gus Height @ [email or web address].  Please call and advise.

## 2014-12-14 ENCOUNTER — Encounter: Payer: Self-pay | Admitting: Neurology

## 2014-12-14 ENCOUNTER — Encounter: Payer: Self-pay | Admitting: *Deleted

## 2014-12-14 ENCOUNTER — Ambulatory Visit (INDEPENDENT_AMBULATORY_CARE_PROVIDER_SITE_OTHER): Payer: Medicare Other | Admitting: Neurology

## 2014-12-14 VITALS — BP 119/85 | HR 85

## 2014-12-14 DIAGNOSIS — G8250 Quadriplegia, unspecified: Secondary | ICD-10-CM

## 2014-12-14 DIAGNOSIS — G8114 Spastic hemiplegia affecting left nondominant side: Secondary | ICD-10-CM

## 2014-12-14 DIAGNOSIS — G35 Multiple sclerosis: Secondary | ICD-10-CM

## 2014-12-14 NOTE — Progress Notes (Signed)
PATIENT: Samuel [name] DOB: [birth year]  HISTORICAL  Samuel [name] is 54 yo RH male is accompanied by his care giver Samuel [name], referred by his neurologist Dr. Anne Hahn for evaluation of potential EMG guided Botox injection for his spastic bilateral upper extremity  He was under the care of Dr. Anne Hahn for few years, with a diagnosis of secondary progressive multiple sclerosis, he was previously treated with Silvestre Moment, last infusion was December 2015, he complains of increased left upper extremity after IV infusion through his left arm, there was no worsening of his symptoms after stopping Tysarbri,  Diagnosis of MS was in 1993, he presented with right optic neuritis in 1986, only had partial recovery, later on, he had relapsing episode, presented with progressive working difficulty, urinary urgency, retention, rapid worsening since 2002, eventually become wheelchair bound in 2014, he used to operate his wheelchair using his right hand, which has gradual worsening spasticity, loosing function, now he has to reach over with his left hand, to operate his wheelchair, which is getting more difficulty, because increased left hand muscle spasm, and weakness,  He had supraorbital catheter since November 2015, but sometimes, he get severe bladder muscle spasm, urinary incontinence still, using condom catheter, he needs his caregiver to get in and out of wheelchair, chronic constipation, no use of bilateral lower extremity.  He is on polypharmacy treatment for his muscle spasticity, this includes Dantrolene, clonazepam, baclofen, Valium, tizanidine, tramadol,  He hoped to preserve the use of his left hand, which he used to feed himself, and operate his wheelchair  UPDATE December 14 2014: This is the first time he received electronic stimulation guided Botox injection for his spastic right and left upper extremity, potential side effect explained, consent form was signed,   REVIEW OF SYSTEMS: Full 14  system review of systems performed and notable only for as above  ALLERGIES: Allergies  Allergen Reactions  . Penicillins Other (See Comments)    Unknown childhoood reaction (takes amoxicillian with reaction) Has tolerated ceftriaxone, cefepime, and Primaxin during inpatient admissions    HOME MEDICATIONS: Current Outpatient Prescriptions  Medication Sig Dispense Refill  . amitriptyline (ELAVIL) 100 MG tablet take 1 tablet by mouth at bedtime 90 tablet 1  . baclofen (LIORESAL) 20 MG tablet take 2 tablets by mouth three times a day 540 tablet 1  . clonazePAM (KLONOPIN) 1 MG tablet Take 1 tablet (1 mg total) by mouth 2 (two) times daily. 60 tablet 3  . clotrimazole-betamethasone (LOTRISONE) cream Apply 1 application topically 2 (two) times daily as needed (for skin irritations).     . Cranberry-Vitamin C-Probiotic (AZO CRANBERRY PO) Take 1 capsule by mouth 2 (two) times daily.     . dantrolene (DANTRIUM) 100 MG capsule Take 100 mg by mouth 3 (three) times daily.    . dantrolene (DANTRIUM) 100 MG capsule take 1 capsule by mouth three times a day 90 capsule 3  . dantrolene (DANTRIUM) 100 MG capsule take 1 capsule by mouth three times a day 90 capsule 3  . diazepam (VALIUM) 10 MG tablet Take 1 tablet (10 mg total) by mouth at bedtime as needed. 30 tablet 5  . docusate sodium (COLACE) 100 MG capsule Take 100 mg by mouth 3 (three) times daily.    . ertapenem 1 g in sodium chloride 0.9 % 50 mL Inject 1 g into the vein daily. 7 Bottle 0  . HYDROcodone-acetaminophen (NORCO/VICODIN) 5-325 MG per tablet Take 1-2 tablets by mouth every 6 (six) hours as  needed. 60 tablet 0  . ibuprofen (ADVIL,MOTRIN) 200 MG tablet Take 600 mg by mouth as needed for fever or moderate pain.     Marland Kitchen levothyroxine (SYNTHROID, LEVOTHROID) 100 MCG tablet Take 100 mcg by mouth daily before breakfast.     . lisinopril-hydrochlorothiazide (PRINZIDE,ZESTORETIC) 10-12.5 MG per tablet Take 0.5 tablets by mouth every morning.     .  mirabegron ER (MYRBETRIQ) 50 MG TB24 tablet Take 50 mg by mouth daily.    Marland Kitchen omeprazole (PRILOSEC) 20 MG capsule Take 20 mg by mouth every morning.     Marland Kitchen perphenazine (TRILAFON) 2 MG tablet take 1 tablet by mouth twice a day (Patient not taking: Reported on 07/10/2014) 60 tablet 6  . perphenazine (TRILAFON) 2 MG tablet Take 2 mg by mouth 2 (two) times daily.    . sertraline (ZOLOFT) 100 MG tablet Take 200 mg by mouth at bedtime.     . solifenacin (VESICARE) 10 MG tablet Take 10 mg by mouth daily.    Marland Kitchen terazosin (HYTRIN) 10 MG capsule Take 10 mg by mouth at bedtime.     Marland Kitchen tiZANidine (ZANAFLEX) 4 MG capsule Take 8 mg by mouth 3 (three) times daily.    Marland Kitchen tiZANidine (ZANAFLEX) 4 MG tablet take 1 tablet by mouth every 4 hours if needed (Patient not taking: Reported on 07/10/2014) 180 tablet 6  . traMADol (ULTRAM) 50 MG tablet Take 1 tablet (50 mg total) by mouth every 6 (six) hours as needed. (Patient taking differently: Take 50 mg by mouth every 6 (six) hours as needed for moderate pain. ) 60 tablet 3  . Vitamin D, Ergocalciferol, (DRISDOL) 50000 UNITS CAPS Take 50,000 Units by mouth every 7 (seven) days. Friday only       PAST MEDICAL HISTORY: Past Medical History  Diagnosis Date  . Hypertension   . GERD (gastroesophageal reflux disease)   . Hypothyroidism   . Vitamin D deficiency   . Chronic back pain   . Gait disorder   . 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 Multiple sclerosis NEUROLOGIST-- [name]    dx 1993, JC virus negative 2/14---  CURRENTLY RECEIVING TYSABRI IV TREATMENT  . Pneumonia     dx  03-03-2014--  admitted for iv and oral antibiotics  . Urinary retention   . Neurogenic bladder   . Foley catheter in place   . Incontinence of urine   . Hyperlipidemia   . Bladder calculi   . Shortness of breath     secondary to fatique  . Chronic fatigue   . Neurogenic bowel     intermittant constipation and diarrhea  . Yeast infection     genital area    PAST SURGICAL HISTORY: Past  Surgical History  Procedure Laterality Date  . Appendectomy  age 34  . Nasal septum surgery  1996  . Negative sleep study  1996  . Transthoracic echocardiogram  02-07-2004    MILD LVH/  EF 55-65%  . Insertion of suprapubic catheter N/A 03/21/2014    Procedure: INSERTION OF SUPRAPUBIC CATHETER;  Surgeon: [name], MD;  Location: [facility];  Service: Urology;  Laterality: N/A;  . Cystoscopy N/A 03/21/2014    Procedure: CYSTOSCOPY TREATMENT OF BLADDER CALCULI;  Surgeon: [name], MD;  Location: [facility];  Service: Urology;  Laterality: N/A;    FAMILY HISTORY: Family History  Problem Relation Age of Onset  . Stroke Father   . Alcoholism Brother     SOCIAL HISTORY:  History   Social History  . Marital Status: Divorced    Spouse Name: N/A  . Number of Children: 0  . Years of Education: college   Occupational History  . Curator     disability   Social History Main Topics  . Smoking status: Former Smoker -- 2.00 packs/day for 8 years    Types: Cigarettes    Quit date: 06/17/1985  . Smokeless tobacco: Never Used  . Alcohol Use: No  . Drug Use: No  . Sexual Activity: Not on file   Other Topics Concern  . Not on file   Social History Narrative   Patient is right handed.   Patient does not drink caffeine.     PHYSICAL EXAM   Filed Vitals:   12/14/14 1556  BP: 119/85  Pulse: 85    Not recorded      There is no weight on file to calculate BMI.  PHYSICAL EXAMNIATION:  Gen: NAD, conversant, well nourised, obese, well groomed                     Cardiovascular: Regular rate rhythm, no peripheral edema, warm, nontender. Eyes: Conjunctivae clear without exudates or hemorrhage Neck: Supple, no carotid bruise. Pulmonary: Clear to auscultation bilaterally   NEUROLOGICAL EXAM:  MENTAL STATUS: Speech: Sitting in wheelchair, mild slow spastic speech, provide good history  Cognition:    The patient is oriented to  person, place, and time;     recent and remote memory intact;     language fluent;     normal attention, concentration,     fund of knowledge.  CRANIAL NERVES: CN II: Pupils were equal round reactive to light  CN III, IV, JY:NWGNFAOZH INO,  CN V: Facial sensation is intact to pinprick in all 3 divisions bilaterally. Corneal responses are intact.  CN VII: Face is symmetric with normal eye closure and smile. CN VIII: Hearing is normal to rubbing fingers CN IX, X: Palate elevates symmetrically. Phonation is normal. CN XI: Head turning and shoulder shrug are intact CN XII: Tongue is midline with normal movements and no atrophy.  MOTOR: He has no significant movement of bilateral lower extremity, fixed knee flexion, significant right upper extremity spasticity, antigravity movement of right shoulder, elbow pronation flexion, wrist flexion, finger flexion, weak grip 2 out of 5, He has mild spasticity of left upper extremity, left elbow abduction 4, external rotation 4, elbow flexion 4, extension 4 minus, wrist flexion 4, wrist extension 3, grip 3 plus   REFLEXES: Reflexes are 2+ and symmetric at the biceps, triceps, knees, and ankles  SENSORY: Decreased vibratory, pinprick at bilateral lower extremity,  COORDINATION: Dysmetric of left upper extremity  GAIT/STANCE:Deferred  DIAGNOSTIC DATA (LABS, IMAGING, TESTING) - I reviewed patient records, labs, notes, testing and imaging myself where available.  Lab Results  Component Value Date   WBC 14.3* 11/14/2014   HGB 17.4 11/14/2014   HCT 51.4* 11/14/2014   MCV 88 11/14/2014   PLT 302 11/14/2014      Component Value Date/Time   NA 137 11/14/2014 1636   NA 144 07/13/2014 0419   K 3.7 11/14/2014 1636   CL 97 11/14/2014 1636   CO2 18 11/14/2014 1636   GLUCOSE 163* 11/14/2014 1636   GLUCOSE 88 07/13/2014 0419   BUN 11 11/14/2014 1636   BUN 5* 07/13/2014 0419   CREATININE 0.61* 11/14/2014 1636   CALCIUM 9.9 11/14/2014 1636    PROT 7.3 11/14/2014 1636   PROT 6.4  07/11/2014 0700   ALBUMIN 3.3* 07/11/2014 0700   AST 17 11/14/2014 1636   ALT 17 11/14/2014 1636   ALKPHOS 120* 11/14/2014 1636   BILITOT <0.2 11/14/2014 1636   BILITOT 0.4 07/11/2014 0700   GFRNONAA 113 11/14/2014 1636   GFRAA 131 11/14/2014 1636    ASSESSMENT AND PLAN  GRIFFITH SANTILLI is a 54 y.o. male with secondary progressive MS, wheelchair bound, spastic quadriplegia, no movement of bilateral lower extremity, severe spasticity of right upper extremity, functional left hand, but increased spasticity, weakness, hope to receive EMG guided Botox injection to preserve his left hand function, and release specificity of right upper extremity, painful upon passive movement.  Under electric stimulation, 300 units of Botox was injected  Left pronator teres 25 units Left flexor digitorum profundus 12.5 units Left palmaris longus 25 units Left brachialis 25 units Left flexor digitorum superficialis 12.5 units  Right brachialis 50 units Right biceps 50 units Right pronator teres 25 units Right flexor digitorum profundus 25 units Right flexor digitorum superficialis 25 units Right pectoralis major 25 units   He will return to clinic in 3 months for repeat injection   Levert Feinstein, M.D. Ph.D.  [facility]  [address], [address] [place], [place] [address] Ph: [phone] Fax: [phone]

## 2014-12-14 NOTE — Telephone Encounter (Signed)
Letter faxed to [facility] 12-14-14.

## 2014-12-14 NOTE — Telephone Encounter (Signed)
Letter signed and sent to [facility].

## 2014-12-15 ENCOUNTER — Other Ambulatory Visit: Payer: Self-pay | Admitting: Neurology

## 2014-12-15 MED ORDER — HYDROCODONE-ACETAMINOPHEN 5-325 MG PO TABS: 1.0000 | ORAL_TABLET | Freq: Four times a day (QID) | ORAL | Status: DC | PRN

## 2014-12-15 NOTE — Telephone Encounter (Signed)
Request entered, forwarded to provider for approval.  

## 2014-12-15 NOTE — Telephone Encounter (Signed)
Patient's caregiver requesting refill for Rx HYDROcodone-acetaminophen (NORCO/VICODIN) 5-325 MG per tablet .  Please call when ready for pick up.

## 2014-12-16 ENCOUNTER — Telehealth: Payer: Self-pay

## 2014-12-16 NOTE — Telephone Encounter (Signed)
I spoke to patient and let him know his prescription is ready to pick up at the front desk with a photo id.

## 2015-01-01 ENCOUNTER — Other Ambulatory Visit: Payer: Self-pay | Admitting: Neurology

## 2015-01-09 ENCOUNTER — Other Ambulatory Visit: Payer: Self-pay | Admitting: Neurology

## 2015-01-10 NOTE — Telephone Encounter (Signed)
Rx has been faxed.

## 2015-01-17 ENCOUNTER — Other Ambulatory Visit: Payer: Self-pay | Admitting: Neurology

## 2015-01-25 ENCOUNTER — Other Ambulatory Visit: Payer: Self-pay | Admitting: Neurology

## 2015-01-25 ENCOUNTER — Telehealth: Payer: Self-pay

## 2015-01-25 MED ORDER — HYDROCODONE-ACETAMINOPHEN 5-325 MG PO TABS: 1.0000 | ORAL_TABLET | Freq: Four times a day (QID) | ORAL | Status: DC | PRN

## 2015-01-25 NOTE — Telephone Encounter (Signed)
Request entered, forwarded to provider for approval.  

## 2015-01-25 NOTE — Telephone Encounter (Signed)
Rx ready for pick up. 

## 2015-01-25 NOTE — Telephone Encounter (Signed)
Dyke Maes, Pt's caregiver, called to request a refill for HYDROcodone-acetaminophen (NORCO/VICODIN) 5-325 MG per tablet. Sister(Donna Weeks) can be reached at [phone] or [phone]) to pick up script. Baldo Ash was advised script would be ready within 24hrs unless otherwise advised by the nurse.

## 2015-02-16 ENCOUNTER — Other Ambulatory Visit: Payer: Self-pay | Admitting: Neurology

## 2015-02-22 ENCOUNTER — Other Ambulatory Visit: Payer: Self-pay | Admitting: Neurology

## 2015-02-23 ENCOUNTER — Other Ambulatory Visit: Payer: Self-pay | Admitting: Neurology

## 2015-02-23 NOTE — Telephone Encounter (Signed)
Rx has been signed and faxed  

## 2015-02-23 NOTE — Telephone Encounter (Signed)
Duplicate Request.

## 2015-03-09 ENCOUNTER — Telehealth: Payer: Self-pay

## 2015-03-09 ENCOUNTER — Telehealth: Payer: Self-pay | Admitting: Neurology

## 2015-03-09 ENCOUNTER — Encounter: Payer: Self-pay | Admitting: *Deleted

## 2015-03-09 ENCOUNTER — Other Ambulatory Visit: Payer: Self-pay | Admitting: Neurology

## 2015-03-09 DIAGNOSIS — G35 Multiple sclerosis: Secondary | ICD-10-CM

## 2015-03-09 DIAGNOSIS — G8114 Spastic hemiplegia affecting left nondominant side: Secondary | ICD-10-CM

## 2015-03-09 MED ORDER — HYDROCODONE-ACETAMINOPHEN 5-325 MG PO TABS: 1.0000 | ORAL_TABLET | Freq: Four times a day (QID) | ORAL | Status: DC | PRN

## 2015-03-09 NOTE — Telephone Encounter (Signed)
Rx ready for pick up. 

## 2015-03-09 NOTE — Telephone Encounter (Signed)
Request entered, forwarded to provider for approval.  

## 2015-03-09 NOTE — Telephone Encounter (Signed)
Patient is calling to get a written Rx for HYDROcodone-acetaminophen (NORCO/VICODIN) 5-325 MG per tablet. I advised the patient that the Rx will be ready in 24 hours unless the nurse advises otherwise. Thank you.

## 2015-03-15 ENCOUNTER — Ambulatory Visit (INDEPENDENT_AMBULATORY_CARE_PROVIDER_SITE_OTHER): Payer: Medicare Other | Admitting: Neurology

## 2015-03-15 ENCOUNTER — Encounter: Payer: Self-pay | Admitting: Neurology

## 2015-03-15 VITALS — BP 124/61 | HR 96 | Temp 96.8°F

## 2015-03-15 DIAGNOSIS — G35 Multiple sclerosis: Secondary | ICD-10-CM

## 2015-03-15 DIAGNOSIS — G8114 Spastic hemiplegia affecting left nondominant side: Secondary | ICD-10-CM

## 2015-03-15 MED ORDER — ONABOTULINUMTOXINA 100 UNITS IJ SOLR
300.0000 [IU] | Freq: Once | INTRAMUSCULAR | Status: AC
Start: 2015-03-15 — End: ?

## 2015-03-15 NOTE — Progress Notes (Signed)
PATIENT: Samuel [name] DOB: [birth year]  HISTORICAL  ARJAN STROHM is 54 yo RH male is accompanied by his care giver Dyke Maes, referred by his neurologist Dr. Anne Hahn for evaluation of potential EMG guided Botox injection for his spastic bilateral upper extremity  He was under the care of Dr. Anne Hahn for few years, with a diagnosis of secondary progressive multiple sclerosis, he was previously treated with Silvestre Moment, last infusion was December 2015, he complains of increased left upper extremity after IV infusion through his left arm, there was no worsening of his symptoms after stopping Tysarbri,  Diagnosis of MS was in 1993, he presented with right optic neuritis in 1986, only had partial recovery, later on, he had relapsing episode, presented with progressive working difficulty, urinary urgency, retention, rapid worsening since 2002, eventually become wheelchair bound in 2014, he used to operate his wheelchair using his right hand, which has gradual worsening spasticity, loosing function, now he has to reach over with his left hand, to operate his wheelchair, which is getting more difficulty, because increased left hand muscle spasm, and weakness,  He had supraorbital catheter since November 2015, but sometimes, he get severe bladder muscle spasm, urinary incontinence still, using condom catheter, he needs his caregiver to get in and out of wheelchair, chronic constipation, no use of bilateral lower extremity.  He is on polypharmacy treatment for his muscle spasticity, this includes Dantrolene, clonazepam, baclofen, Valium, tizanidine, tramadol,  He hoped to preserve the use of his left hand, which he used to feed himself, and operate his wheelchair  UPDATE December 14 2014: This is the first time he received electronic stimulation guided Botox injection for his spastic right and left upper extremity, potential side effect explained, consent form was signed,  UPDATE March 15 2015: He had his  first EMG guided Botox injection in April 2016 received 300 units, he was not sure about the benefit from previous injection in April 2016, there was no significant side effect noticed. Today he complains of mild nausea  REVIEW OF SYSTEMS: Full 14 system review of systems performed and notable only for as above  ALLERGIES: Allergies  Allergen Reactions  . Penicillins Other (See Comments)    Unknown childhoood reaction (takes amoxicillian with reaction) Has tolerated ceftriaxone, cefepime, and Primaxin during inpatient admissions    HOME MEDICATIONS: Current Outpatient Prescriptions  Medication Sig Dispense Refill  . amitriptyline (ELAVIL) 100 MG tablet take 1 tablet by mouth at bedtime 90 tablet 1  . baclofen (LIORESAL) 20 MG tablet take 2 tablets by mouth three times a day 540 tablet 1  . clonazePAM (KLONOPIN) 1 MG tablet Take 1 tablet (1 mg total) by mouth 2 (two) times daily. 60 tablet 3  . clotrimazole-betamethasone (LOTRISONE) cream Apply 1 application topically 2 (two) times daily as needed (for skin irritations).     . Cranberry-Vitamin C-Probiotic (AZO CRANBERRY PO) Take 1 capsule by mouth 2 (two) times daily.     . dantrolene (DANTRIUM) 100 MG capsule Take 100 mg by mouth 3 (three) times daily.    . dantrolene (DANTRIUM) 100 MG capsule take 1 capsule by mouth three times a day 90 capsule 3  . dantrolene (DANTRIUM) 100 MG capsule take 1 capsule by mouth three times a day 90 capsule 3  . diazepam (VALIUM) 10 MG tablet Take 1 tablet (10 mg total) by mouth at bedtime as needed. 30 tablet 5  . docusate sodium (COLACE) 100 MG capsule Take 100 mg by mouth 3 (three)  times daily.    . ertapenem 1 g in sodium chloride 0.9 % 50 mL Inject 1 g into the vein daily. 7 Bottle 0  . HYDROcodone-acetaminophen (NORCO/VICODIN) 5-325 MG per tablet Take 1-2 tablets by mouth every 6 (six) hours as needed. 60 tablet 0  . ibuprofen (ADVIL,MOTRIN) 200 MG tablet Take 600 mg by mouth as needed for fever or  moderate pain.     Marland Kitchen levothyroxine (SYNTHROID, LEVOTHROID) 100 MCG tablet Take 100 mcg by mouth daily before breakfast.     . lisinopril-hydrochlorothiazide (PRINZIDE,ZESTORETIC) 10-12.5 MG per tablet Take 0.5 tablets by mouth every morning.     . mirabegron ER (MYRBETRIQ) 50 MG TB24 tablet Take 50 mg by mouth daily.    Marland Kitchen omeprazole (PRILOSEC) 20 MG capsule Take 20 mg by mouth every morning.     Marland Kitchen perphenazine (TRILAFON) 2 MG tablet take 1 tablet by mouth twice a day (Patient not taking: Reported on 07/10/2014) 60 tablet 6  . perphenazine (TRILAFON) 2 MG tablet Take 2 mg by mouth 2 (two) times daily.    . sertraline (ZOLOFT) 100 MG tablet Take 200 mg by mouth at bedtime.     . solifenacin (VESICARE) 10 MG tablet Take 10 mg by mouth daily.    Marland Kitchen terazosin (HYTRIN) 10 MG capsule Take 10 mg by mouth at bedtime.     Marland Kitchen tiZANidine (ZANAFLEX) 4 MG capsule Take 8 mg by mouth 3 (three) times daily.    Marland Kitchen tiZANidine (ZANAFLEX) 4 MG tablet take 1 tablet by mouth every 4 hours if needed (Patient not taking: Reported on 07/10/2014) 180 tablet 6  . traMADol (ULTRAM) 50 MG tablet Take 1 tablet (50 mg total) by mouth every 6 (six) hours as needed. (Patient taking differently: Take 50 mg by mouth every 6 (six) hours as needed for moderate pain. ) 60 tablet 3  . Vitamin D, Ergocalciferol, (DRISDOL) 50000 UNITS CAPS Take 50,000 Units by mouth every 7 (seven) days. Friday only       PAST MEDICAL HISTORY: Past Medical History  Diagnosis Date  . Hypertension   . GERD (gastroesophageal reflux disease)   . Hypothyroidism   . Vitamin D deficiency   . Chronic back pain   . Gait disorder   . 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 Multiple sclerosis NEUROLOGIST-- [name]    dx 1993, JC virus negative 2/14---  CURRENTLY RECEIVING TYSABRI IV TREATMENT  . Pneumonia     dx  03-03-2014--  admitted for iv and oral antibiotics  . Urinary retention   . Neurogenic bladder   . Foley catheter in place   . Incontinence of urine    . Hyperlipidemia   . Bladder calculi   . Shortness of breath     secondary to fatique  . Chronic fatigue   . Neurogenic bowel     intermittant constipation and diarrhea  . Yeast infection     genital area    PAST SURGICAL HISTORY: Past Surgical History  Procedure Laterality Date  . Appendectomy  age 49  . Nasal septum surgery  1996  . Negative sleep study  1996  . Transthoracic echocardiogram  02-07-2004    MILD LVH/  EF 55-65%  . Insertion of suprapubic catheter N/A 03/21/2014    Procedure: INSERTION OF SUPRAPUBIC CATHETER;  Surgeon: [name], MD;  Location[facility];  Service: Urology;  Laterality: N/A;  . Cystoscopy N/A 03/21/2014    Procedure: CYSTOSCOPY TREATMENT OF BLADDER CALCULI;  Surgeon: Onalee Hua  Ellin Goodie, MD;  Location: [facility];  Service: Urology;  Laterality: N/A;    FAMILY HISTORY: Family History  Problem Relation Age of Onset  . Stroke Father   . Alcoholism Brother     SOCIAL HISTORY:  History   Social History  . Marital Status: Divorced    Spouse Name: N/A  . Number of Children: 0  . Years of Education: college   Occupational History  . Curator     disability   Social History Main Topics  . Smoking status: Former Smoker -- 2.00 packs/day for 8 years    Types: Cigarettes    Quit date: 06/17/1985  . Smokeless tobacco: Never Used  . Alcohol Use: No  . Drug Use: No  . Sexual Activity: Not on file   Other Topics Concern  . Not on file   Social History Narrative   Patient is right handed.   Patient does not drink caffeine.     PHYSICAL EXAM   Filed Vitals:   03/15/15 1422  BP: 124/61  Pulse: 96  Temp: 96.8 F (36 C)  TempSrc: Oral    Not recorded      There is no weight on file to calculate BMI.  NEUROLOGICAL EXAM:  MENTAL STATUS: Speech: Sitting in wheelchair, mild slow spastic speech, provide good history  Cognition:    The patient is oriented to person, place, and time;      recent and remote memory intact;     language fluent;     normal attention, concentration,     fund of knowledge.  CRANIAL NERVES: CN II: Pupils were equal round reactive to light  CN III, IV, ZO:[id number] INO,  CN V: Facial sensation is intact to pinprick in all 3 divisions bilaterally. Corneal responses are intact.  CN VII: Face is symmetric with normal eye closure and smile. CN VIII: Hearing is normal to rubbing fingers CN IX, X: Palate elevates symmetrically. Phonation is normal. CN XI: Head turning and shoulder shrug are intact CN XII: Tongue is midline with normal movements and no atrophy.  MOTOR: He has no significant movement of bilateral lower extremity, fixed knee flexion, significant right upper extremity spasticity, antigravity movement of right shoulder, elbow pronation flexion, wrist flexion, finger flexion, weak grip 2 out of 5, He has mild spasticity of left upper extremity, left elbow abduction 4, external rotation 4, elbow flexion 4, extension 4 minus, wrist flexion 4, wrist extension 3, grip 3 plus   REFLEXES: Reflexes are 2+ and symmetric at the biceps, triceps, knees, and ankles  SENSORY: Decreased vibratory, pinprick at bilateral lower extremity,  COORDINATION: Dysmetric of left upper extremity  GAIT/STANCE:Deferred  DIAGNOSTIC DATA (LABS, IMAGING, TESTING) - I reviewed patient records, labs, notes, testing and imaging myself where available.  Lab Results  Component Value Date   WBC 14.3* 11/14/2014   HGB 17.4 11/14/2014   HCT 51.4* 11/14/2014   MCV 88 11/14/2014   PLT 302 11/14/2014      Component Value Date/Time   NA 137 11/14/2014 1636   NA 144 07/13/2014 0419   K 3.7 11/14/2014 1636   CL 97 11/14/2014 1636   CO2 18 11/14/2014 1636   GLUCOSE 163* 11/14/2014 1636   GLUCOSE 88 07/13/2014 0419   BUN 11 11/14/2014 1636   BUN 5* 07/13/2014 0419   CREATININE 0.61* 11/14/2014 1636   CALCIUM 9.9 11/14/2014 1636   PROT 7.3 11/14/2014 1636   PROT  6.4 07/11/2014 0700   ALBUMIN 3.3*  07/11/2014 0700   AST 17 11/14/2014 1636   ALT 17 11/14/2014 1636   ALKPHOS 120* 11/14/2014 1636   BILITOT <0.2 11/14/2014 1636   BILITOT 0.4 07/11/2014 0700   GFRNONAA 113 11/14/2014 1636   GFRAA 131 11/14/2014 1636    ASSESSMENT AND PLAN  MARKIS LANGLAND is a 54 y.o. male with secondary progressive MS, wheelchair bound, spastic quadriplegia, no movement of bilateral lower extremity, severe spasticity of right upper extremity, functional left hand, but increased spasticity, weakness, hope to receive EMG guided Botox injection to preserve his left hand function, and release spasticity of right upper extremity, painful upon passive movement.  Under electric stimulation, 300 units of Botox was injected  Left pronator teres 25 units Left flexor digitorum profundus 12.5 units Left palmaris longus 25 units Left flexor digitorum superficialis 12.5 units Left flexor carpi ulnaris 25 units  Right brachialis 75 units Right biceps 50 units Right pronator teres 25 units Right flexor digitorum profundus 25 units Right flexor digitorum superficialis 25 units   He will return to clinic in 3 months for repeat injection   Levert Feinstein, M.D. Ph.D.  [facility]  [address] [place], [place] [address] Ph: [phone] Fax: [phone]

## 2015-03-15 NOTE — Progress Notes (Signed)
**  Botox - [id number], Exp 10/2017**mck,rn

## 2015-05-15 ENCOUNTER — Ambulatory Visit: Payer: Self-pay | Admitting: Neurology

## 2015-05-15 ENCOUNTER — Telehealth: Payer: Self-pay

## 2015-05-15 NOTE — Telephone Encounter (Signed)
Patient did not come to a follow up appointment today.  

## 2015-05-16 ENCOUNTER — Encounter: Payer: Self-pay | Admitting: Neurology

## 2015-05-17 ENCOUNTER — Telehealth: Payer: Self-pay | Admitting: Neurology

## 2015-05-17 NOTE — Telephone Encounter (Signed)
Patients sister called stating he missed appt on Monday due to throwing up. He is in a lot pain sitting up, he throws up if he sits up, he thinks it's motion sickness and that's why he missed appt Monday morning. It is the only time he gets out of bed anymore is to go Dr's appts. She is inquiring what do to help bedridden patients .Margaretmary Dys where a RN would come out to home? She needs to know what is the next step as he cannot make it to appts anymore. Please call and advise. She can be reached at [phone].

## 2015-05-17 NOTE — Telephone Encounter (Signed)
I called the patient's sister. She stated that he had been getting nauseous and vomiting when he sits up for about a month now. She states as long as he is in the bed he is okay and he eats, which is why they think it is related to motion. They are not able to get the patient into the office for appointments anymore. She says it is just too difficult to get him out of the bed. She would like someone to come to the home. They do not need assistance with daily activities like bathing/dressing. They just need to figure out a way for a doctor to see him and continue writing his prescriptions. I explained that Dr. Anne Hahn does not do home visits. I advised the patient's sister that I would figure out a plan with Dr. Anne Hahn and call her back.

## 2015-05-18 ENCOUNTER — Ambulatory Visit: Payer: Medicare Other | Admitting: Neurology

## 2015-05-18 MED ORDER — SCOPOLAMINE 1 MG/3DAYS TD PT72: 1.0000 | MEDICATED_PATCH | TRANSDERMAL | Status: DC

## 2015-05-18 MED ORDER — PREDNISONE 10 MG PO TABS: ORAL_TABLET | ORAL | Status: DC

## 2015-05-18 NOTE — Telephone Encounter (Signed)
I called the sister. The patient has had a one-month history of significant dizziness and vertigo with sitting up. He feels better lying down. He has a lot of musculoskeletal pain with sitting as well. They have gotten over-the-counter motion sickness pills, but this does not help. I will try a Transderm scope patch, give him a trial on prednisone. The patient has hydrocodone to take for pain, he appears to be relatively comfortable when he is lying down.

## 2015-05-29 ENCOUNTER — Telehealth: Payer: Self-pay | Admitting: Neurology

## 2015-05-29 NOTE — Telephone Encounter (Signed)
Pt called and is concerned that he has not had his Sept. Appt. He has rescheduled and the first avail. Is in Feb. He is also concerned about his botox injections. He wants to know if he needs to keep his botox appt with out being seen first. Please call and [phone]

## 2015-05-29 NOTE — Telephone Encounter (Signed)
I called the patient. Appointment scheduled 06/08/15.

## 2015-05-30 ENCOUNTER — Other Ambulatory Visit: Payer: Self-pay | Admitting: Neurology

## 2015-06-01 ENCOUNTER — Telehealth: Payer: Self-pay | Admitting: Neurology

## 2015-06-01 MED ORDER — HYDROCODONE-ACETAMINOPHEN 5-325 MG PO TABS: 1.0000 | ORAL_TABLET | Freq: Four times a day (QID) | ORAL | Status: DC | PRN

## 2015-06-01 NOTE — Telephone Encounter (Signed)
Pt's caregiver Samuel [name]) called requesting refill for HYDROcodone-acetaminophen (NORCO/VICODIN) 5-325 MG per tablet . Pt advised RX will be ready within 24 hours unless otherwise informed by RN

## 2015-06-01 NOTE — Telephone Encounter (Signed)
Refill for hydrocodone was written

## 2015-06-02 ENCOUNTER — Emergency Department (HOSPITAL_COMMUNITY): Payer: Medicare Other

## 2015-06-02 ENCOUNTER — Inpatient Hospital Stay (HOSPITAL_COMMUNITY)
Admission: EM | Admit: 2015-06-02 | Discharge: 2015-06-05 | DRG: 698 | Disposition: A | Discharge status: Home Health | Payer: Medicare Other | Attending: Internal Medicine | Admitting: Internal Medicine

## 2015-06-02 ENCOUNTER — Inpatient Hospital Stay (HOSPITAL_COMMUNITY): Payer: Medicare Other

## 2015-06-02 ENCOUNTER — Telehealth: Payer: Self-pay

## 2015-06-02 ENCOUNTER — Encounter (HOSPITAL_COMMUNITY): Payer: Self-pay | Admitting: Emergency Medicine

## 2015-06-02 DIAGNOSIS — E039 Hypothyroidism, unspecified: Secondary | ICD-10-CM | POA: Diagnosis present

## 2015-06-02 DIAGNOSIS — E440 Moderate protein-calorie malnutrition: Secondary | ICD-10-CM | POA: Diagnosis present

## 2015-06-02 DIAGNOSIS — I248 Other forms of acute ischemic heart disease: Secondary | ICD-10-CM | POA: Diagnosis present

## 2015-06-02 DIAGNOSIS — Y846 Urinary catheterization as the cause of abnormal reaction of the patient, or of later complication, without mention of misadventure at the time of the procedure: Secondary | ICD-10-CM | POA: Diagnosis present

## 2015-06-02 DIAGNOSIS — N136 Pyonephrosis: Secondary | ICD-10-CM | POA: Diagnosis present

## 2015-06-02 DIAGNOSIS — Z681 Body mass index (BMI) 19 or less, adult: Secondary | ICD-10-CM

## 2015-06-02 DIAGNOSIS — R6520 Severe sepsis without septic shock: Secondary | ICD-10-CM | POA: Diagnosis present

## 2015-06-02 DIAGNOSIS — E876 Hypokalemia: Secondary | ICD-10-CM | POA: Diagnosis present

## 2015-06-02 DIAGNOSIS — N319 Neuromuscular dysfunction of bladder, unspecified: Secondary | ICD-10-CM | POA: Diagnosis present

## 2015-06-02 DIAGNOSIS — K592 Neurogenic bowel, not elsewhere classified: Secondary | ICD-10-CM | POA: Diagnosis present

## 2015-06-02 DIAGNOSIS — I4891 Unspecified atrial fibrillation: Secondary | ICD-10-CM | POA: Diagnosis not present

## 2015-06-02 DIAGNOSIS — G35 Multiple sclerosis: Secondary | ICD-10-CM | POA: Diagnosis present

## 2015-06-02 DIAGNOSIS — E872 Acidosis: Secondary | ICD-10-CM | POA: Diagnosis present

## 2015-06-02 DIAGNOSIS — G8250 Quadriplegia, unspecified: Secondary | ICD-10-CM | POA: Diagnosis present

## 2015-06-02 DIAGNOSIS — G35D Multiple sclerosis, unspecified: Secondary | ICD-10-CM | POA: Diagnosis present

## 2015-06-02 DIAGNOSIS — A419 Sepsis, unspecified organism: Secondary | ICD-10-CM | POA: Diagnosis not present

## 2015-06-02 DIAGNOSIS — H469 Unspecified optic neuritis: Secondary | ICD-10-CM | POA: Diagnosis present

## 2015-06-02 DIAGNOSIS — N139 Obstructive and reflux uropathy, unspecified: Secondary | ICD-10-CM | POA: Diagnosis present

## 2015-06-02 DIAGNOSIS — G92 Toxic encephalopathy: Secondary | ICD-10-CM | POA: Diagnosis present

## 2015-06-02 DIAGNOSIS — N320 Bladder-neck obstruction: Secondary | ICD-10-CM | POA: Diagnosis present

## 2015-06-02 DIAGNOSIS — N179 Acute kidney failure, unspecified: Secondary | ICD-10-CM | POA: Diagnosis present

## 2015-06-02 DIAGNOSIS — Z87891 Personal history of nicotine dependence: Secondary | ICD-10-CM | POA: Diagnosis not present

## 2015-06-02 DIAGNOSIS — N1330 Unspecified hydronephrosis: Secondary | ICD-10-CM

## 2015-06-02 DIAGNOSIS — T83010A Breakdown (mechanical) of cystostomy catheter, initial encounter: Secondary | ICD-10-CM | POA: Diagnosis present

## 2015-06-02 DIAGNOSIS — R4182 Altered mental status, unspecified: Secondary | ICD-10-CM | POA: Diagnosis not present

## 2015-06-02 DIAGNOSIS — K219 Gastro-esophageal reflux disease without esophagitis: Secondary | ICD-10-CM | POA: Diagnosis present

## 2015-06-02 DIAGNOSIS — R7989 Other specified abnormal findings of blood chemistry: Secondary | ICD-10-CM

## 2015-06-02 DIAGNOSIS — A4189 Other specified sepsis: Secondary | ICD-10-CM | POA: Diagnosis present

## 2015-06-02 DIAGNOSIS — Z993 Dependence on wheelchair: Secondary | ICD-10-CM | POA: Diagnosis not present

## 2015-06-02 DIAGNOSIS — I10 Essential (primary) hypertension: Secondary | ICD-10-CM | POA: Diagnosis present

## 2015-06-02 DIAGNOSIS — N390 Urinary tract infection, site not specified: Secondary | ICD-10-CM

## 2015-06-02 DIAGNOSIS — T83018A Breakdown (mechanical) of other indwelling urethral catheter, initial encounter: Secondary | ICD-10-CM | POA: Diagnosis not present

## 2015-06-02 DIAGNOSIS — R509 Fever, unspecified: Secondary | ICD-10-CM | POA: Diagnosis not present

## 2015-06-02 DIAGNOSIS — E559 Vitamin D deficiency, unspecified: Secondary | ICD-10-CM | POA: Diagnosis present

## 2015-06-02 DIAGNOSIS — E785 Hyperlipidemia, unspecified: Secondary | ICD-10-CM | POA: Diagnosis present

## 2015-06-02 DIAGNOSIS — T83511A Infection and inflammatory reaction due to indwelling urethral catheter, initial encounter: Secondary | ICD-10-CM

## 2015-06-02 DIAGNOSIS — Z79899 Other long term (current) drug therapy: Secondary | ICD-10-CM | POA: Diagnosis not present

## 2015-06-02 DIAGNOSIS — G929 Unspecified toxic encephalopathy: Secondary | ICD-10-CM

## 2015-06-02 DIAGNOSIS — T83011A Breakdown (mechanical) of indwelling urethral catheter, initial encounter: Secondary | ICD-10-CM | POA: Insufficient documentation

## 2015-06-02 LAB — CBC
HCT: 41.2 % (ref 39.0–52.0)
Hemoglobin: 13.9 g/dL (ref 13.0–17.0)
MCH: 29.9 pg (ref 26.0–34.0)
MCHC: 33.7 g/dL (ref 30.0–36.0)
MCV: 88.6 fL (ref 78.0–100.0)
Platelets: 155 10*3/uL (ref 150–400)
RBC: 4.65 MIL/uL (ref 4.22–5.81)
RDW: 14.5 % (ref 11.5–15.5)
WBC: 32.6 10*3/uL — ABNORMAL HIGH (ref 4.0–10.5)

## 2015-06-02 LAB — TSH: TSH: 0.632 u[IU]/mL (ref 0.350–4.500)

## 2015-06-02 LAB — CREATININE, SERUM
Creatinine, Ser: 0.7 mg/dL (ref 0.61–1.24)
GFR calc Af Amer: >60 mL/min (ref >60)
GFR calc non Af Amer: >60 mL/min (ref >60)

## 2015-06-02 LAB — URINALYSIS, ROUTINE W REFLEX MICROSCOPIC
Bilirubin Urine: NEGATIVE
Glucose, UA: NEGATIVE mg/dL
Ketones, ur: NEGATIVE mg/dL
Nitrite: POSITIVE — AB
Protein, ur: 30 mg/dL — AB
Specific Gravity, Urine: 1.014 (ref 1.005–1.030)
Urobilinogen, UA: 0.2 mg/dL (ref 0.0–1.0)
pH: 6 (ref 5.0–8.0)

## 2015-06-02 LAB — I-STAT CG4 LACTIC ACID, ED
Lactic Acid, Venous: 2.15 mmol/L (ref 0.5–2.0)
Lactic Acid, Venous: 4.22 mmol/L (ref 0.5–2.0)

## 2015-06-02 LAB — URINE MICROSCOPIC-ADD ON

## 2015-06-02 LAB — MAGNESIUM: Magnesium: 1.6 mg/dL — ABNORMAL LOW (ref 1.7–2.4)

## 2015-06-02 LAB — COMPREHENSIVE METABOLIC PANEL
ALT: 19 U/L (ref 17–63)
AST: 32 U/L (ref 15–41)
Albumin: 3.9 g/dL (ref 3.5–5.0)
Alkaline Phosphatase: 80 U/L (ref 38–126)
Anion gap: 13 (ref 5–15)
BUN: 15 mg/dL (ref 6–20)
CO2: 22 mmol/L (ref 22–32)
Calcium: 9.3 mg/dL (ref 8.9–10.3)
Chloride: 97 mmol/L — ABNORMAL LOW (ref 101–111)
Creatinine, Ser: 0.81 mg/dL (ref 0.61–1.24)
GFR calc Af Amer: >60 mL/min (ref >60)
GFR calc non Af Amer: >60 mL/min (ref >60)
Glucose, Bld: 149 mg/dL — ABNORMAL HIGH (ref 65–99)
Potassium: 3.3 mmol/L — ABNORMAL LOW (ref 3.5–5.1)
Sodium: 132 mmol/L — ABNORMAL LOW (ref 135–145)
Total Bilirubin: 1.3 mg/dL — ABNORMAL HIGH (ref 0.3–1.2)
Total Protein: 6 g/dL — ABNORMAL LOW (ref 6.5–8.1)

## 2015-06-02 LAB — CBC WITH DIFFERENTIAL/PLATELET
Basophils Absolute: 0 10*3/uL (ref 0.0–0.1)
Basophils Relative: 0 %
Eosinophils Absolute: 0 10*3/uL (ref 0.0–0.7)
Eosinophils Relative: 0 %
HCT: 50.4 % (ref 39.0–52.0)
Hemoglobin: 17.5 g/dL — ABNORMAL HIGH (ref 13.0–17.0)
Lymphocytes Relative: 3 %
Lymphs Abs: 1.5 10*3/uL (ref 0.7–4.0)
MCH: 30.4 pg (ref 26.0–34.0)
MCHC: 34.7 g/dL (ref 30.0–36.0)
MCV: 87.5 fL (ref 78.0–100.0)
Monocytes Absolute: 3.1 10*3/uL — ABNORMAL HIGH (ref 0.1–1.0)
Monocytes Relative: 6 %
Neutro Abs: 46.5 10*3/uL — ABNORMAL HIGH (ref 1.7–7.7)
Neutrophils Relative %: 91 %
Platelets: 256 10*3/uL (ref 150–400)
RBC: 5.76 MIL/uL (ref 4.22–5.81)
RDW: 14.4 % (ref 11.5–15.5)
WBC: 51.1 10*3/uL (ref 4.0–10.5)

## 2015-06-02 LAB — TROPONIN I
Troponin I: 0.38 ng/mL — ABNORMAL HIGH (ref <0.031)
Troponin I: 0.24 ng/mL — ABNORMAL HIGH (ref <0.031)
Troponin I: 0.2 ng/mL — ABNORMAL HIGH (ref <0.031)

## 2015-06-02 LAB — LACTIC ACID, PLASMA: Lactic Acid, Venous: 1 mmol/L (ref 0.5–2.0)

## 2015-06-02 MED ORDER — TIZANIDINE HCL 4 MG PO CAPS: 8.0000 mg | ORAL_CAPSULE | Freq: Three times a day (TID) | ORAL | Status: DC

## 2015-06-02 MED ORDER — HYDROCODONE-ACETAMINOPHEN 5-325 MG PO TABS
1.0000 | ORAL_TABLET | Freq: Four times a day (QID) | ORAL | Status: DC | PRN
  Administered 2015-06-02: 1 via ORAL
  Administered 2015-06-03 – 2015-06-05 (×5): 2 via ORAL
  Filled 2015-06-02 (×2): qty 2
  Filled 2015-06-02: qty 1
  Filled 2015-06-02 (×3): qty 2

## 2015-06-02 MED ORDER — SODIUM CHLORIDE 0.9 % IV BOLUS (SEPSIS)
1000.0000 mL | Freq: Once | INTRAVENOUS | Status: AC
  Administered 2015-06-02: 1000 mL via INTRAVENOUS

## 2015-06-02 MED ORDER — ONDANSETRON HCL 4 MG/2ML IJ SOLN
4.0000 mg | Freq: Once | INTRAMUSCULAR | Status: AC
  Administered 2015-06-02: 4 mg via INTRAVENOUS
  Filled 2015-06-02: qty 2

## 2015-06-02 MED ORDER — MAGNESIUM SULFATE 50 % IJ SOLN
3.0000 g | Freq: Once | INTRAMUSCULAR | Status: AC
  Administered 2015-06-02: 3 g via INTRAVENOUS
  Filled 2015-06-02: qty 6

## 2015-06-02 MED ORDER — MORPHINE SULFATE (PF) 4 MG/ML IV SOLN
4.0000 mg | Freq: Once | INTRAVENOUS | Status: AC
  Administered 2015-06-02: 4 mg via INTRAVENOUS
  Filled 2015-06-02: qty 1

## 2015-06-02 MED ORDER — DANTROLENE SODIUM 100 MG PO CAPS
100.0000 mg | ORAL_CAPSULE | Freq: Three times a day (TID) | ORAL | Status: DC
  Administered 2015-06-02 – 2015-06-05 (×9): 100 mg via ORAL
  Filled 2015-06-02 (×11): qty 1

## 2015-06-02 MED ORDER — ONDANSETRON HCL 4 MG/2ML IJ SOLN
4.0000 mg | Freq: Four times a day (QID) | INTRAMUSCULAR | Status: DC | PRN
  Administered 2015-06-02 – 2015-06-04 (×3): 4 mg via INTRAVENOUS
  Filled 2015-06-02 (×3): qty 2

## 2015-06-02 MED ORDER — IOHEXOL 300 MG/ML  SOLN
100.0000 mL | Freq: Once | INTRAMUSCULAR | Status: AC | PRN
  Administered 2015-06-02: 100 mL via INTRAVENOUS

## 2015-06-02 MED ORDER — SODIUM CHLORIDE 0.9 % IV BOLUS (SEPSIS)
500.0000 mL | Freq: Once | INTRAVENOUS | Status: AC
  Administered 2015-06-02: 500 mL via INTRAVENOUS

## 2015-06-02 MED ORDER — VANCOMYCIN HCL IN DEXTROSE 750-5 MG/150ML-% IV SOLN
750.0000 mg | Freq: Three times a day (TID) | INTRAVENOUS | Status: DC
  Administered 2015-06-02 – 2015-06-04 (×7): 750 mg via INTRAVENOUS
  Filled 2015-06-02 (×8): qty 150

## 2015-06-02 MED ORDER — LEVOTHYROXINE SODIUM 125 MCG PO TABS
125.0000 ug | ORAL_TABLET | Freq: Every day | ORAL | Status: DC
  Administered 2015-06-03 – 2015-06-05 (×3): 125 ug via ORAL
  Filled 2015-06-02 (×4): qty 1

## 2015-06-02 MED ORDER — DEXTROSE 5 % IV SOLN
1.0000 g | INTRAVENOUS | Status: DC
  Filled 2015-06-02: qty 10

## 2015-06-02 MED ORDER — VANCOMYCIN HCL IN DEXTROSE 1-5 GM/200ML-% IV SOLN
1000.0000 mg | Freq: Once | INTRAVENOUS | Status: AC
  Administered 2015-06-02: 1000 mg via INTRAVENOUS
  Filled 2015-06-02: qty 200

## 2015-06-02 MED ORDER — ENOXAPARIN SODIUM 40 MG/0.4ML ~~LOC~~ SOLN
40.0000 mg | SUBCUTANEOUS | Status: DC
  Filled 2015-06-02: qty 0.4

## 2015-06-02 MED ORDER — BACLOFEN 20 MG PO TABS
40.0000 mg | ORAL_TABLET | Freq: Three times a day (TID) | ORAL | Status: DC
  Administered 2015-06-02 – 2015-06-05 (×10): 40 mg via ORAL
  Filled 2015-06-02 (×11): qty 2

## 2015-06-02 MED ORDER — DEXTROSE 5 % IV SOLN
1.0000 g | Freq: Once | INTRAVENOUS | Status: AC
  Administered 2015-06-02: 1 g via INTRAVENOUS
  Filled 2015-06-02: qty 10

## 2015-06-02 MED ORDER — TIZANIDINE HCL 4 MG PO TABS
8.0000 mg | ORAL_TABLET | Freq: Three times a day (TID) | ORAL | Status: DC
  Administered 2015-06-02 – 2015-06-05 (×10): 8 mg via ORAL
  Filled 2015-06-02 (×12): qty 2

## 2015-06-02 MED ORDER — LEVALBUTEROL HCL 0.63 MG/3ML IN NEBU: 0.6300 mg | INHALATION_SOLUTION | Freq: Four times a day (QID) | RESPIRATORY_TRACT | Status: DC | PRN

## 2015-06-02 MED ORDER — ONDANSETRON HCL 4 MG PO TABS: 4.0000 mg | ORAL_TABLET | Freq: Four times a day (QID) | ORAL | Status: DC | PRN

## 2015-06-02 MED ORDER — MIRABEGRON ER 50 MG PO TB24
50.0000 mg | ORAL_TABLET | Freq: Every day | ORAL | Status: DC
  Administered 2015-06-02 – 2015-06-05 (×4): 50 mg via ORAL
  Filled 2015-06-02 (×5): qty 1

## 2015-06-02 MED ORDER — DARIFENACIN HYDROBROMIDE ER 7.5 MG PO TB24
7.5000 mg | ORAL_TABLET | Freq: Every day | ORAL | Status: DC
  Administered 2015-06-02 – 2015-06-05 (×4): 7.5 mg via ORAL
  Filled 2015-06-02 (×4): qty 1

## 2015-06-02 MED ORDER — PERPHENAZINE 2 MG PO TABS
2.0000 mg | ORAL_TABLET | Freq: Two times a day (BID) | ORAL | Status: DC
  Administered 2015-06-02 – 2015-06-05 (×6): 2 mg via ORAL
  Filled 2015-06-02 (×7): qty 1

## 2015-06-02 MED ORDER — ACETAMINOPHEN 650 MG RE SUPP: 650.0000 mg | Freq: Four times a day (QID) | RECTAL | Status: DC | PRN

## 2015-06-02 MED ORDER — IMIPENEM-CILASTATIN 500 MG IV SOLR
500.0000 mg | Freq: Three times a day (TID) | INTRAVENOUS | Status: DC
  Administered 2015-06-02 – 2015-06-04 (×5): 500 mg via INTRAVENOUS
  Filled 2015-06-02 (×7): qty 500

## 2015-06-02 MED ORDER — PANTOPRAZOLE SODIUM 40 MG PO TBEC
40.0000 mg | DELAYED_RELEASE_TABLET | Freq: Every day | ORAL | Status: DC
  Administered 2015-06-02 – 2015-06-05 (×4): 40 mg via ORAL
  Filled 2015-06-02 (×4): qty 1

## 2015-06-02 MED ORDER — ACETAMINOPHEN 325 MG PO TABS: 650.0000 mg | ORAL_TABLET | Freq: Four times a day (QID) | ORAL | Status: DC | PRN

## 2015-06-02 MED ORDER — DIAZEPAM 2 MG PO TABS: 2.0000 mg | ORAL_TABLET | Freq: Three times a day (TID) | ORAL | Status: DC | PRN

## 2015-06-02 MED ORDER — PHENAZOPYRIDINE HCL 100 MG PO TABS
95.0000 mg | ORAL_TABLET | Freq: Three times a day (TID) | ORAL | Status: DC
  Administered 2015-06-02 – 2015-06-04 (×7): 100 mg via ORAL
  Filled 2015-06-02 (×9): qty 1

## 2015-06-02 MED ORDER — POTASSIUM CHLORIDE IN NACL 20-0.9 MEQ/L-% IV SOLN
INTRAVENOUS | Status: DC
  Administered 2015-06-02 – 2015-06-05 (×6): via INTRAVENOUS
  Filled 2015-06-02 (×10): qty 1000

## 2015-06-02 MED ORDER — SCOPOLAMINE 1 MG/3DAYS TD PT72
1.0000 | MEDICATED_PATCH | TRANSDERMAL | Status: DC
  Administered 2015-06-02 – 2015-06-05 (×2): 1.5 mg via TRANSDERMAL
  Filled 2015-06-02 (×2): qty 1

## 2015-06-02 NOTE — ED Notes (Signed)
Attempted report 

## 2015-06-02 NOTE — ED Notes (Signed)
Attempted report a 2nd time. Advised nurse will call back as soon as she can.

## 2015-06-02 NOTE — Telephone Encounter (Signed)
Rx ready for pick up. 

## 2015-06-02 NOTE — H&P (Addendum)
[facility] History and Physical  TRI CHITTICK ZOX:[id number] DOB: 10/01/1960 DOA: 06/02/2015  Referring physician:   PCP: [name], MD   Chief Complaint: Altered mental status  HPI:  54 year old male with a history of multiple sclerosis, spastic quadriparesis, neurogenic bladder with chronic suprapubic Foley catheter who presents with altered mental status, fever at home. Family called EMS because of leaking urine from the suprapubic catheter and confusion that started 2 days ago. Per EMS the patient also had a fever of 100.8 and was tachycardic into the 140s. Patient also complained of lower abdominal discomfort. In the ER the patient's suprapubic catheter appeared to be blocked with leakage of urine for around catheter site.. Suprapubic catheter was replaced by ER physician which improved the patient's suprapubic pain and discomfort. White blood cell count was found to be 51,000. Sepsis protocol was initiated.   Patient is followed by [facility]. Diagnosed with MS was in 1993, he presented with right optic neuritis in 1986, only had partial recovery, later on, he had relapsing episode, presented with progressive working difficulty, urinary urgency, retention, rapid worsening since 2002, eventually become wheelchair bound in 2014, he used to operate his wheelchair using his right hand, which has gradual worsening spasticity, loosing function, now he has to reach over with his left hand, to operate his wheelchair, which is getting more difficulty, because increased left hand muscle spasm, and weakness,  He had suprapubic catheter since November 2015, but sometimes, he has severe bladder muscle spasm, urinary incontinence, continues using condom catheter, he needs his caregiver to get in and out of wheelchair, chronic constipation, no use of bilateral lower extremity.      Review of Systems: negative for the following  Constitutional: POSITIVE FOR  fever,  chills, diaphoresis, appetite change and fatigue.  HEENT: Denies photophobia, eye pain, redness, hearing loss, ear pain, congestion, sore throat, rhinorrhea, sneezing, mouth sores, trouble swallowing, neck pain, neck stiffness and tinnitus.  Respiratory: Denies SOB, DOE, cough, chest tightness, and wheezing.  Cardiovascular: Denies chest pain, palpitations and leg swelling.  Gastrointestinal: Denies nausea, vomiting, abdominal pain, diarrhea, constipation, blood in stool and abdominal distention.  Genitourinary: POSITIVE FOR   dysuria, urgency, frequency, hematuria, flank pain and difficulty urinating.  Musculoskeletal: Denies myalgias, back pain, joint swelling, arthralgias and gait problem.  Skin: Denies pallor, rash and wound.  Neurological: Denies dizziness, seizures, syncope, weakness, light-headedness, numbness and headaches.  Hematological: Denies adenopathy. Easy bruising, personal or family bleeding history  Psychiatric/Behavioral: Denies suicidal ideation, mood changes, confusion, nervousness, sleep disturbance and agitation       Past Medical History  Diagnosis Date  . Hypertension   . GERD (gastroesophageal reflux disease)   . Hypothyroidism   . Vitamin D deficiency   . Chronic back pain   . Gait disorder   . 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 Multiple sclerosis NEUROLOGIST-- [name]    dx 1993, JC virus negative 2/14---  CURRENTLY RECEIVING TYSABRI IV TREATMENT  . Pneumonia     dx  03-03-2014--  admitted for iv and oral antibiotics  . Urinary retention   . Neurogenic bladder   . Foley catheter in place   . Incontinence of urine   . Hyperlipidemia   . Bladder calculi   . Shortness of breath     secondary to fatique  . Chronic fatigue   . Neurogenic bowel     intermittant constipation and diarrhea  . Yeast infection     genital area  Past Surgical History  Procedure Laterality Date  . Appendectomy  age 37  . Nasal septum surgery  1996  . Negative sleep  study  1996  . Transthoracic echocardiogram  02-07-2004    MILD LVH/  EF 55-65%  . Insertion of suprapubic catheter N/A 03/21/2014    Procedure: INSERTION OF SUPRAPUBIC CATHETER;  Surgeon: [name], MD;  Location: [facility];  Service: Urology;  Laterality: N/A;  . Cystoscopy N/A 03/21/2014    Procedure: CYSTOSCOPY TREATMENT OF BLADDER CALCULI;  Surgeon: [name], MD;  Location: [facility];  Service: Urology;  Laterality: N/A;      Social History:  reports that he quit smoking about 29 years ago. His smoking use included Cigarettes. He has a 16 pack-year smoking history. He has never used smokeless tobacco. He reports that he does not drink alcohol or use illicit drugs.    Allergies  Allergen Reactions  . Penicillins Other (See Comments)    Unknown childhoood reaction (takes amoxicillian with reaction) Has tolerated ceftriaxone, cefepime, and Primaxin during inpatient admissions    Family History  Problem Relation Age of Onset  . Stroke Father   . Alcoholism Brother        Prior to Admission medications   Medication Sig Start Date End Date Taking? Authorizing Provider  ALPRAZolam Prudy Feeler) 1 MG tablet TAKE ONE TABLET BY MOUTH AT BEDTIME AS NEEDED FOR ANXIETY 05/31/15  Yes York Spaniel, MD  baclofen (LIORESAL) 20 MG tablet take 2 tablets by mouth three times a day 10/07/14  Yes York Spaniel, MD  clonazePAM (KLONOPIN) 1 MG tablet TAKE ONE TABLET BY MOUTH TWICE DAILY 02/23/15  Yes York Spaniel, MD  Cranberry-Vitamin C-Probiotic (AZO CRANBERRY PO) Take 1 tablet by mouth 2 (two) times daily.   Yes Historical Provider, MD  dantrolene (DANTRIUM) 100 MG capsule TAKE ONE CAPSULE BY MOUTH THREE TIMES DAILY 02/16/15  Yes York Spaniel, MD  diazepam (VALIUM) 10 MG tablet Take 10 mg by mouth at bedtime as needed for anxiety.   Yes Historical Provider, MD  HYDROcodone-acetaminophen (NORCO/VICODIN) 5-325 MG tablet Take 1-2 tablets by mouth every  6 (six) hours as needed. Patient taking differently: Take 1-2 tablets by mouth every 6 (six) hours as needed for moderate pain.  06/01/15  Yes York Spaniel, MD  ibuprofen (ADVIL,MOTRIN) 200 MG tablet Take 600 mg by mouth as needed for fever or moderate pain.    Yes Historical Provider, MD  levothyroxine (SYNTHROID, LEVOTHROID) 125 MCG tablet Take 125 mcg by mouth daily. 05/22/15  Yes Historical Provider, MD  lisinopril-hydrochlorothiazide (PRINZIDE,ZESTORETIC) 10-12.5 MG per tablet Take 0.5 tablets by mouth every morning.    Yes Historical Provider, MD  mirabegron ER (MYRBETRIQ) 50 MG TB24 tablet Take 50 mg by mouth daily.   Yes Historical Provider, MD  omeprazole (PRILOSEC) 20 MG capsule Take 20 mg by mouth every morning.    Yes Historical Provider, MD  OnabotulinumtoxinA (BOTOX IJ) Inject 300 Units as directed every 3 (three) months. Administered at MD office   Yes Historical Provider, MD  perphenazine (TRILAFON) 2 MG tablet TAKE ONE TABLET BY MOUTH TWICE DAILY 01/02/15  Yes York Spaniel, MD  Phenazopyridine HCl (AZO TABS PO) Take by mouth 2 (two) times daily.   Yes Historical Provider, MD  scopolamine (TRANSDERM-SCOP, 1.5 MG,) 1 MG/3DAYS Place 1 patch (1.5 mg total) onto the skin every 3 (three) days. 05/18/15  Yes York Spaniel, MD  sertraline (ZOLOFT)  100 MG tablet Take 200 mg by mouth at bedtime.    Yes Historical Provider, MD  solifenacin (VESICARE) 10 MG tablet Take 10 mg by mouth daily.   Yes Historical Provider, MD  tiZANidine (ZANAFLEX) 4 MG capsule Take 8 mg by mouth 3 (three) times daily.   Yes Historical Provider, MD  traMADol (ULTRAM) 50 MG tablet Take 1 tablet (50 mg total) by mouth every 6 (six) hours as needed. Patient taking differently: Take 50 mg by mouth every 6 (six) hours as needed for moderate pain.  11/14/14  Yes York Spaniel, MD  Vitamin D, Ergocalciferol, (DRISDOL) 50000 UNITS CAPS Take 50,000 Units by mouth every 7 (seven) days. Friday only   Yes Historical  Provider, MD  amitriptyline (ELAVIL) 100 MG tablet take 1 tablet by mouth at bedtime Patient not taking: Reported on 06/02/2015 07/25/14   York Spaniel, MD  predniSONE (DELTASONE) 10 MG tablet Begin taking 6 tablets daily, taper by one tablet every other day until off the medication. Patient not taking: Reported on 06/02/2015 05/18/15   York Spaniel, MD     Physical Exam: Filed Vitals:   06/02/15 0800 06/02/15 0815 06/02/15 0830 06/02/15 0845  BP: 101/71 106/70 100/69 98/69  Pulse: 119 106 111 105  Temp:      TempSrc:      Resp: 19   19  SpO2: 97% 97% 96% 96%     Constitutional: Vital signs reviewed. Patient is a well-developed and well-nourished in no acute distress and cooperative with exam. Alert and oriented x3.  Head: Normocephalic and atraumatic  Ear: TM normal bilaterally  Mouth: no erythema or exudates, MMM  Eyes: PERRL, EOMI, conjunctivae normal, No scleral icterus.  Neck: Supple, Trachea midline normal ROM, No JVD, mass, thyromegaly, or carotid bruit present.  Cardiovascular: RRR, S1 normal, S2 normal, no MRG, pulses symmetric and intact bilaterally  Pulmonary/Chest: CTAB, no wheezes, rales, or rhonchi  Abdominal: Soft. Non-tender, non-distended, bowel sounds are normal, no masses, organomegaly, or guarding present.  GU: no CVA tenderness Musculoskeletal: No joint deformities, erythema, or stiffness, ROM full and no nontender Ext: no edema and no cyanosis, pulses palpable bilaterally (DP and PT)  Hematology: no cervical, inginal, or axillary adenopathy.  He has no significant movement of bilateral lower extremity, fixed knee flexion, significant right upper extremity spasticity, antigravity movement of right shoulder, elbow pronation flexion, wrist flexion, finger flexion, weak grip 2 out of 5, He has mild spasticity of left upper extremity, left elbow abduction 4, external rotation 4, elbow flexion 4, extension 4 minus, wrist flexion 4, wrist extension 3, grip 3  plus Skin: Warm, dry and intact. No rash, cyanosis, or clubbing.  Psychiatric: Normal mood and affect. speech and behavior is normal. Judgment and thought content normal. Cognition and memory are normal.      Data Review   Micro Results No results found for this or any previous visit (from the past 240 hour(s)).  Radiology Reports Ct Abdomen Pelvis W Contrast  06/02/2015   CLINICAL DATA:  Nausea and vomiting for 2 days. Fever. Previous appendectomy.  EXAM: CT ABDOMEN AND PELVIS WITH CONTRAST  TECHNIQUE: Multidetector CT imaging of the abdomen and pelvis was performed using the standard protocol following bolus administration of intravenous contrast.  CONTRAST:  OMNIPAQUE IOHEXOL 300 MG/ML  SOLN  COMPARISON:  07/11/2014  FINDINGS: Lower chest:  No acute findings.  Hepatobiliary: No masses or other significant abnormality. Gallbladder is unremarkable.  Pancreas: No mass, inflammatory changes, or  other significant abnormality.  Spleen: Within normal limits in size and appearance.  Adrenals/Urinary Tract: No masses identified. Mild bilateral hydronephrosis and ureterectasis is seen to the level of the urinary bladder which is new since previous study. Mild perinephric soft tissue stranding and fluid is increased since previous study, although there is no radiographic evidence of focal pyelonephritis. No obstructing ureteral calculi visualized.  Several small calculi are noted in the urinary bladder and a small nonobstructive calculus is also seen in the upper pole the right kidney. Diffuse bladder wall thickening is again demonstrated with suprapubic catheter remaining in place.  Stomach/Bowel: No evidence of obstruction, inflammatory process, or abnormal fluid collections.  Vascular/Lymphatic: No pathologically enlarged lymph nodes. No evidence of abdominal aortic aneurysm.  Reproductive: No mass or other significant abnormality.  Other: None.  Musculoskeletal:  No suspicious bone lesions  identified.  IMPRESSION: New mild bilateral hydroureteronephrosis to the level the urinary bladder, and bilateral perinephric stranding and fluid. No obstructing ureteral calculi identified. This may be related to vesicoureteral reflux or chronic bladder outlet obstruction.  Chronic diffuse bladder wall thickening with suprapubic catheter remaining in place.  Small nonobstructive calculi again noted in the urinary bladder and are pole right kidney.   Electronically Signed   By: Myles Rosenthal M.D.   On: 06/02/2015 07:31   Dg Chest Port 1 View  06/02/2015   CLINICAL DATA:  Sepsis. History of hypertension, pneumonia, multiple scleroses, spastic hemiplegia.  EXAM: PORTABLE CHEST 1 VIEW  COMPARISON:  Chest radiograph July 10, 2014  FINDINGS: Patient rotated to the RIGHT.Cardiomediastinal silhouette is unremarkable for this low inspiratory portable examination with crowded vasculature markings. The lungs are clear without pleural effusions or focal consolidations. Trachea projects midline and there is no pneumothorax. Nipple shadow LEFT lung base. Included soft tissue planes and osseous structures are non-suspicious. Similar osteopenia.  IMPRESSION: No acute cardiopulmonary process for this low inspiratory portable examination.   Electronically Signed   By: Awilda Metro M.D.   On: 06/02/2015 05:46     CBC  Recent Labs Lab 06/02/15 0500  WBC 51.1*  HGB 17.5*  HCT 50.4  PLT 256  MCV 87.5  MCH 30.4  MCHC 34.7  RDW 14.4  LYMPHSABS 1.5  MONOABS 3.1*  EOSABS 0.0  BASOSABS 0.0    Chemistries   Recent Labs Lab 06/02/15 0500  NA 132*  K 3.3*  CL 97*  CO2 22  GLUCOSE 149*  BUN 15  CREATININE 0.81  CALCIUM 9.3  AST 32  ALT 19  ALKPHOS 80  BILITOT 1.3*   ------------------------------------------------------------------------------------------------------------------ CrCl cannot be calculated (Unknown ideal  weight.). ------------------------------------------------------------------------------------------------------------------ No results for input(s): HGBA1C in the last 72 hours. ------------------------------------------------------------------------------------------------------------------ No results for input(s): CHOL, HDL, LDLCALC, TRIG, CHOLHDL, LDLDIRECT in the last 72 hours. ------------------------------------------------------------------------------------------------------------------ No results for input(s): TSH, T4TOTAL, T3FREE, THYROIDAB in the last 72 hours.  Invalid input(s): FREET3 ------------------------------------------------------------------------------------------------------------------ No results for input(s): VITAMINB12, FOLATE, FERRITIN, TIBC, IRON, RETICCTPCT in the last 72 hours.  Coagulation profile No results for input(s): INR, PROTIME in the last 168 hours.  No results for input(s): DDIMER in the last 72 hours.  Cardiac Enzymes No results for input(s): CKMB, TROPONINI, MYOGLOBIN in the last 168 hours.  Invalid input(s): CK ------------------------------------------------------------------------------------------------------------------ Invalid input(s): POCBNP   CBG: No results for input(s): GLUCAP in the last 168 hours.     EKG: Independently reviewed.    Assessment/Plan Sepsis secondary to UTI/Bladder outlet obstruction/acute pyelonephritis/neurogenic bladder with chronic suprapubic catheter Bladder outlet obstruction secondary to malfunctioning  suprapubic catheter now replaced Patient started on Rocephin and vancomycin Follow blood culture 2, follow urine culture Mild bilateral hydronephrosis should resolve, will keep checking postvoid residual Continue sepsis protocol Strict I's and O's, check post void residual, urology consult for hydronephrosis , discussed with Dr.Wrenn Patient will be admitted to step down  Toxic  encephalopathy-secondary to UTI, now resolved after receiving antibiotics and IV hydration, patient is back to baseline    Multiple sclerosis-He is on polypharmacy treatment for his muscle spasticity, this includes Dantrolene, clonazepam, baclofen, Valium, tizanidine, tramadol.    Malnutrition of moderate degree  Sinus tachycardia likely secondary to pain/fever/sepsis-continue aggressive hydration   Code Status:   full Family Communication: bedside Disposition Plan: admit   Total time spent 55 minutes.Greater than 50% of this time was spent in counseling, explanation of diagnosis, planning of further management, and coordination of care  [facility] Pager [phone]  If 7PM-7AM, please contact night-coverage [email or web address] Password [email or web address] 06/02/2015, 9:06 AM

## 2015-06-02 NOTE — Progress Notes (Signed)
ANTIBIOTIC CONSULT NOTE - INITIAL  Pharmacy Consult for imipenem / vancomycin  Indication: rule out sepsis  Allergies  Allergen Reactions  . Penicillins Other (See Comments)    Unknown childhoood reaction (takes amoxicillian with reaction) Has tolerated ceftriaxone, cefepime, and Primaxin during inpatient admissions    Patient Measurements: Height:  (185.4 cm) Weight: 134 lb 14.7 oz (61.2 kg) IBW/kg (Calculated) : 79.9   Vital Signs: Temp: 98.4 F (36.9 C) (09/30 1715) Temp Source: Oral (09/30 1715) BP: 131/75 mmHg (09/30 1715) Pulse Rate: 89 (09/30 1715) Intake/Output from previous day:   Intake/Output from this shift: Total I/O In: -  Out: 2664 [Urine:2664]  Labs:  Recent Labs  06/02/15 0500 06/02/15 0925  WBC 51.1* 32.6*  HGB 17.5* 13.9  PLT 256 155  CREATININE 0.81 0.70   Estimated Creatinine Clearance: 91.4 mL/min (by C-G formula based on Cr of 0.7). No results for input(s): VANCOTROUGH, VANCOPEAK, VANCORANDOM, GENTTROUGH, GENTPEAK, GENTRANDOM, TOBRATROUGH, TOBRAPEAK, TOBRARND, AMIKACINPEAK, AMIKACINTROU, AMIKACIN in the last 72 hours.   Microbiology: No results found for this or any previous visit (from the past 720 hour(s)).  Medical History: Past Medical History  Diagnosis Date  . Hypertension   . GERD (gastroesophageal reflux disease)   . Hypothyroidism   . Vitamin D deficiency   . Chronic back pain   . Gait disorder   . 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 Multiple sclerosis NEUROLOGIST-- [name]    dx 1993, JC virus negative 2/14---  CURRENTLY RECEIVING TYSABRI IV TREATMENT  . Pneumonia     dx  03-03-2014--  admitted for iv and oral antibiotics  . Urinary retention   . Neurogenic bladder   . Foley catheter in place   . Incontinence of urine   . Hyperlipidemia   . Bladder calculi   . Shortness of breath     secondary to fatique  . Chronic fatigue   . Neurogenic bowel     intermittant constipation and diarrhea  . Yeast infection      genital area     Assessment: 54yom Hx MS, spastic quadriparesis, neurogenic bladder with chronic suprapubic foley catheter who presents with altered mental status, fever at home.  Suprapubic catheter is leaking and seems to be blocked  WBC elevated 51> 33 LA rising 2.15 > 4.22, afebrile here but fever PTA  - begin broad spectrum ABX.  Vancomycin 1gm x1 given in ED - continue, and begin Imipenem.   Mag 1.6  - will replace.    Goal of Therapy:  Vancomycin trough level 15-20 mcg/ml  Plan:  Vancomycin  q8hr Check VT at SS Imipenem  q8  Magnesium IV 3gm IV x1  Leota Sauers Pharm.D. CPP, BCPS Clinical Pharmacist [phone] 06/02/2015 6:40 PM

## 2015-06-02 NOTE — ED Provider Notes (Signed)
CSN: [id number]     Arrival date & time 06/02/15  0434 History   First MD Initiated Contact with Patient 06/02/15 0445     Chief Complaint  Patient presents with  . Code Sepsis     (Consider location/radiation/quality/duration/timing/severity/associated sxs/prior Treatment) HPI Patient brought in by EMS after family found patient to be confused. Also felt warm and thought to be febrile. Per EMS patient had low-grade fever and tachycardia. Normal blood pressure. Patient has lower abdominal discomfort. He has an indwelling suprapubic Foley catheter this been in place for greater than 1 year. Family states he gets frequent "bladder stones" and requires changing the Foley catheter frequently. They did notice that the catheter was no longer draining as of today. Patient denies nausea, vomiting or diarrhea. No recent cough or shortness of breath. He has a history of quadriplegia due to multiple sclerosis. Past Medical History  Diagnosis Date  . Hypertension   . GERD (gastroesophageal reflux disease)   . Hypothyroidism   . Vitamin D deficiency   . Chronic back pain   . Gait disorder   . 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 Multiple sclerosis NEUROLOGIST-- [name]    dx 1993, JC virus negative 2/14---  CURRENTLY RECEIVING TYSABRI IV TREATMENT  . Pneumonia     dx  03-03-2014--  admitted for iv and oral antibiotics  . Urinary retention   . Neurogenic bladder   . Foley catheter in place   . Incontinence of urine   . Hyperlipidemia   . Bladder calculi   . Shortness of breath     secondary to fatique  . Chronic fatigue   . Neurogenic bowel     intermittant constipation and diarrhea  . Yeast infection     genital area   Past Surgical History  Procedure Laterality Date  . Appendectomy  age 30  . Nasal septum surgery  1996  . Negative sleep study  1996  . Transthoracic echocardiogram  02-07-2004    MILD LVH/  EF 55-65%  . Insertion of suprapubic catheter N/A 03/21/2014    Procedure:  INSERTION OF SUPRAPUBIC CATHETER;  Surgeon: [name], MD;  Location: [facility];  Service: Urology;  Laterality: N/A;  . Cystoscopy N/A 03/21/2014    Procedure: CYSTOSCOPY TREATMENT OF BLADDER CALCULI;  Surgeon: [name], MD;  Location: [facility];  Service: Urology;  Laterality: N/A;   Family History  Problem Relation Age of Onset  . Stroke Father   . Cirrhosis Brother   . Hypothyroidism Sister    Social History  Substance Use Topics  . Smoking status: Former Smoker -- 1.50 packs/day for 8 years    Types: Cigarettes    Quit date: 06/17/1985  . Smokeless tobacco: Never Used  . Alcohol Use: No    Review of Systems  Constitutional: Positive for fever.  Respiratory: Negative for cough and shortness of breath.   Cardiovascular: Negative for chest pain.  Gastrointestinal: Positive for abdominal pain and abdominal distention. Negative for nausea, vomiting, diarrhea and constipation.  Genitourinary: Positive for difficulty urinating.  Musculoskeletal: Negative for back pain.  Skin: Negative for rash and wound.  Neurological: Negative for dizziness, light-headedness and headaches.  Psychiatric/Behavioral: Positive for confusion.  All other systems reviewed and are negative.     Allergies  Penicillins  Home Medications   Prior to Admission medications   Medication Sig Start Date End Date Taking? Authorizing Joyclyn Plazola  ALPRAZolam (XANAX) 1 MG tablet TAKE ONE TABLET BY MOUTH  AT BEDTIME AS NEEDED FOR ANXIETY 05/31/15  Yes York Spaniel, MD  baclofen (LIORESAL) 20 MG tablet take 2 tablets by mouth three times a day 10/07/14  Yes York Spaniel, MD  clonazePAM (KLONOPIN) 1 MG tablet TAKE ONE TABLET BY MOUTH TWICE DAILY 02/23/15  Yes York Spaniel, MD  Cranberry-Vitamin C-Probiotic (AZO CRANBERRY PO) Take 1 tablet by mouth 2 (two) times daily.   Yes Historical Leolia Vinzant, MD  dantrolene (DANTRIUM) 100 MG capsule TAKE ONE CAPSULE BY MOUTH THREE  TIMES DAILY 02/16/15  Yes York Spaniel, MD  diazepam (VALIUM) 10 MG tablet Take 10 mg by mouth at bedtime as needed for anxiety.   Yes Historical Maliik Karner, MD  HYDROcodone-acetaminophen (NORCO/VICODIN) 5-325 MG tablet Take 1-2 tablets by mouth every 6 (six) hours as needed. Patient taking differently: Take 1-2 tablets by mouth every 6 (six) hours as needed for moderate pain.  06/01/15  Yes York Spaniel, MD  ibuprofen (ADVIL,MOTRIN) 200 MG tablet Take 600 mg by mouth as needed for fever or moderate pain.    Yes Historical Jem Castro, MD  levothyroxine (SYNTHROID, LEVOTHROID) 125 MCG tablet Take 125 mcg by mouth daily. 05/22/15  Yes Historical Nathanial Arrighi, MD  lisinopril-hydrochlorothiazide (PRINZIDE,ZESTORETIC) 10-12.5 MG per tablet Take 0.5 tablets by mouth every morning.    Yes Historical Kelcy Baeten, MD  mirabegron ER (MYRBETRIQ) 50 MG TB24 tablet Take 50 mg by mouth daily.   Yes Historical Gregg Holster, MD  omeprazole (PRILOSEC) 20 MG capsule Take 20 mg by mouth every morning.    Yes Historical Yarden Manuelito, MD  OnabotulinumtoxinA (BOTOX IJ) Inject 300 Units as directed every 3 (three) months. Administered at MD office   Yes Historical Rickita Forstner, MD  perphenazine (TRILAFON) 2 MG tablet TAKE ONE TABLET BY MOUTH TWICE DAILY 01/02/15  Yes York Spaniel, MD  Phenazopyridine HCl (AZO TABS PO) Take by mouth 2 (two) times daily.   Yes Historical Noreta Kue, MD  scopolamine (TRANSDERM-SCOP, 1.5 MG,) 1 MG/3DAYS Place 1 patch (1.5 mg total) onto the skin every 3 (three) days. 05/18/15  Yes York Spaniel, MD  sertraline (ZOLOFT) 100 MG tablet Take 200 mg by mouth at bedtime.    Yes Historical Nashonda Limberg, MD  solifenacin (VESICARE) 10 MG tablet Take 10 mg by mouth daily.   Yes Historical Olivianna Higley, MD  tiZANidine (ZANAFLEX) 4 MG capsule Take 8 mg by mouth 3 (three) times daily.   Yes Historical Jazyah Butsch, MD  traMADol (ULTRAM) 50 MG tablet Take 1 tablet (50 mg total) by mouth every 6 (six) hours as needed. Patient taking  differently: Take 50 mg by mouth every 6 (six) hours as needed for moderate pain.  11/14/14  Yes York Spaniel, MD  Vitamin D, Ergocalciferol, (DRISDOL) 50000 UNITS CAPS Take 50,000 Units by mouth every 7 (seven) days. Friday only   Yes Historical Anuoluwapo Mefferd, MD  amitriptyline (ELAVIL) 100 MG tablet take 1 tablet by mouth at bedtime Patient not taking: Reported on 06/02/2015 07/25/14   York Spaniel, MD  predniSONE (DELTASONE) 10 MG tablet Begin taking 6 tablets daily, taper by one tablet every other day until off the medication. Patient not taking: Reported on 06/02/2015 05/18/15   York Spaniel, MD   BP 116/72 mmHg  Pulse 77  Temp(Src) 98.4 F (36.9 C) (Oral)  Resp 18  Ht  (1.854 m)  Wt 134 lb 14.7 oz (61.2 kg)  BMI 17.80 kg/m2  SpO2 99% Physical Exam  Constitutional: He is oriented to person, place, and time. He  appears well-developed and well-nourished. No distress.  Chronically ill-appearing  HENT:  Head: Normocephalic and atraumatic.  Mouth/Throat: Oropharynx is clear and moist.  Dry mucous membranes  Eyes: EOM are normal. Pupils are equal, round, and reactive to light.  Neck: Normal range of motion. Neck supple.  Cardiovascular: Regular rhythm.   Tachycardia  Pulmonary/Chest: Effort normal and breath sounds normal. No respiratory distress. He has no wheezes. He has no rales.  Abdominal: Soft. Bowel sounds are normal. He exhibits distension. There is tenderness.  Patient has suprapubic distention and tenderness. Suprapubic foley catheter in place but is draining no urine.  Musculoskeletal: Normal range of motion. He exhibits no edema or tenderness.  Spastic quadriplegia  Neurological: He is alert and oriented to person, place, and time.  Patient has some movement of the left upper extremity but 0/5 motor in all others. This is the patient's baseline. He is awake answering questions appropriately.  Skin: Skin is warm and dry. No rash noted. No erythema.  Psychiatric:  He has a normal mood and affect. His behavior is normal.  Nursing note and vitals reviewed.   ED Course  BLADDER CATHETERIZATION Date/Time: 06/02/2015 5:45 AM Performed by: Loren Racer Authorized by: Ranae Palms, DAVID Indications: catheter change Local anesthesia used: no Catheter insertion: indwelling Catheter type: Foley Catheter size: 20 Fr Complicated insertion: no Number of attempts: 1 Urine characteristics: cloudy Comments: Suprapubic catheter changed in sterile manner. Immediate relief with drainage   (including critical care time) Labs Review Labs Reviewed  COMPREHENSIVE METABOLIC PANEL - Abnormal; Notable for the following:    Sodium 132 (*)    Potassium 3.3 (*)    Chloride 97 (*)    Glucose, Bld 149 (*)    Total Protein 6.0 (*)    Total Bilirubin 1.3 (*)    All other components within normal limits  CBC WITH DIFFERENTIAL/PLATELET - Abnormal; Notable for the following:    WBC 51.1 (*)    Hemoglobin 17.5 (*)    Neutro Abs 46.5 (*)    Monocytes Absolute 3.1 (*)    All other components within normal limits  URINALYSIS, ROUTINE W REFLEX MICROSCOPIC (NOT AT [facility]) - Abnormal; Notable for the following:    APPearance TURBID (*)    Hgb urine dipstick LARGE (*)    Protein, ur 30 (*)    Nitrite POSITIVE (*)    Leukocytes, UA LARGE (*)    All other components within normal limits  URINE MICROSCOPIC-ADD ON - Abnormal; Notable for the following:    Bacteria, UA MANY (*)    Crystals CA OXALATE CRYSTALS (*)    All other components within normal limits  CBC - Abnormal; Notable for the following:    WBC 32.6 (*)    All other components within normal limits  MAGNESIUM - Abnormal; Notable for the following:    Magnesium 1.6 (*)    All other components within normal limits  TROPONIN I - Abnormal; Notable for the following:    Troponin I 0.38 (*)    All other components within normal limits  TROPONIN I - Abnormal; Notable for the following:    Troponin I 0.24 (*)     All other components within normal limits  TROPONIN I - Abnormal; Notable for the following:    Troponin I 0.20 (*)    All other components within normal limits  COMPREHENSIVE METABOLIC PANEL - Abnormal; Notable for the following:    Potassium 2.9 (*)    Glucose, Bld 109 (*)  Creatinine, Ser 0.39 (*)    Calcium 8.5 (*)    Total Protein 4.7 (*)    Albumin 2.7 (*)    ALT 12 (*)    All other components within normal limits  CBC - Abnormal; Notable for the following:    WBC 21.7 (*)    Hemoglobin 12.5 (*)    HCT 37.9 (*)    Platelets 139 (*)    All other components within normal limits  MAGNESIUM - Abnormal; Notable for the following:    Magnesium 2.7 (*)    All other components within normal limits  LACTIC ACID, PLASMA - Abnormal; Notable for the following:    Lactic Acid, Venous 3.2 (*)    All other components within normal limits  I-STAT CG4 LACTIC ACID, ED - Abnormal; Notable for the following:    Lactic Acid, Venous 2.15 (*)    All other components within normal limits  I-STAT CG4 LACTIC ACID, ED - Abnormal; Notable for the following:    Lactic Acid, Venous 4.22 (*)    All other components within normal limits  CULTURE, BLOOD (ROUTINE X 2)  CULTURE, BLOOD (ROUTINE X 2)  URINE CULTURE  CREATININE, SERUM  TSH  LACTIC ACID, PLASMA  LACTIC ACID, PLASMA  LACTIC ACID, PLASMA    Imaging Review Ct Abdomen Pelvis W Contrast  06/02/2015   CLINICAL DATA:  Nausea and vomiting for 2 days. Fever. Previous appendectomy.  EXAM: CT ABDOMEN AND PELVIS WITH CONTRAST  TECHNIQUE: Multidetector CT imaging of the abdomen and pelvis was performed using the standard protocol following bolus administration of intravenous contrast.  CONTRAST:  OMNIPAQUE IOHEXOL 300 MG/ML  SOLN  COMPARISON:  07/11/2014  FINDINGS: Lower chest:  No acute findings.  Hepatobiliary: No masses or other significant abnormality. Gallbladder is unremarkable.  Pancreas: No mass, inflammatory changes, or other  significant abnormality.  Spleen: Within normal limits in size and appearance.  Adrenals/Urinary Tract: No masses identified. Mild bilateral hydronephrosis and ureterectasis is seen to the level of the urinary bladder which is new since previous study. Mild perinephric soft tissue stranding and fluid is increased since previous study, although there is no radiographic evidence of focal pyelonephritis. No obstructing ureteral calculi visualized.  Several small calculi are noted in the urinary bladder and a small nonobstructive calculus is also seen in the upper pole the right kidney. Diffuse bladder wall thickening is again demonstrated with suprapubic catheter remaining in place.  Stomach/Bowel: No evidence of obstruction, inflammatory process, or abnormal fluid collections.  Vascular/Lymphatic: No pathologically enlarged lymph nodes. No evidence of abdominal aortic aneurysm.  Reproductive: No mass or other significant abnormality.  Other: None.  Musculoskeletal:  No suspicious bone lesions identified.  IMPRESSION: New mild bilateral hydroureteronephrosis to the level the urinary bladder, and bilateral perinephric stranding and fluid. No obstructing ureteral calculi identified. This may be related to vesicoureteral reflux or chronic bladder outlet obstruction.  Chronic diffuse bladder wall thickening with suprapubic catheter remaining in place.  Small nonobstructive calculi again noted in the urinary bladder and are pole right kidney.   Electronically Signed   By: Myles Rosenthal M.D.   On: 06/02/2015 07:31   US Renal  06/02/2015   CLINICAL DATA:  54 year old male with mild hydronephrosis identified on recent CT.  EXAM: RENAL / URINARY TRACT ULTRASOUND COMPLETE  COMPARISON:  06/02/2015 CT  FINDINGS: Right Kidney:  Length: 11.5 cm. Echogenicity within normal limits. No mass or hydronephrosis visualized.  Left Kidney:  Length: 10.5 cm. Echogenicity within normal  limits. No mass or hydronephrosis visualized.  Bladder:   A catheter is identified within a collapsed bladder.  IMPRESSION: No evidence of hydronephrosis identified on this study. Kidneys are unremarkable.  Catheter identified within the bladder.   Electronically Signed   By: Harmon Pier M.D.   On: 06/02/2015 23:39   Dg Chest Port 1 View  06/02/2015   CLINICAL DATA:  Sepsis. History of hypertension, pneumonia, multiple scleroses, spastic hemiplegia.  EXAM: PORTABLE CHEST 1 VIEW  COMPARISON:  Chest radiograph July 10, 2014  FINDINGS: Patient rotated to the RIGHT.Cardiomediastinal silhouette is unremarkable for this low inspiratory portable examination with crowded vasculature markings. The lungs are clear without pleural effusions or focal consolidations. Trachea projects midline and there is no pneumothorax. Nipple shadow LEFT lung base. Included soft tissue planes and osseous structures are non-suspicious. Similar osteopenia.  IMPRESSION: No acute cardiopulmonary process for this low inspiratory portable examination.   Electronically Signed   By: Awilda Metro M.D.   On: 06/02/2015 05:46   I have personally reviewed and evaluated these images and lab results as part of my medical decision-making.   EKG Interpretation   Date/Time:  Friday June 02 2015 04:43:25 EDT Ventricular Rate:  144 PR Interval:  101 QRS Duration: 120 QT Interval:  301 QTC Calculation: 466 R Axis:   -108 Text Interpretation:  Sinus or ectopic atrial tachycardia Nonspecific IVCD  with LAD Inferior infarct, old Probable anterolateral infarct, age  indeterm Confirmed by Ranae Palms  MD, DAVID ([id number]) on 06/02/2015 5:07:56 AM      MDM   Final diagnoses:  Urinary tract infection associated with catheterization of urinary tract, initial encounter  Malfunction of Foley catheter, initial encounter    Suspect obstructed suprapubic Foley catheter. We'll replace in ED. This likely is responsible for the patient's tachycardia and elevated blood pressure. Given the low-grade  fever sepsis protocol initiated but will hold antibiotics and extensive IV fluid.  Patient's suprapubic catheter was changed in the emergency department. Immediate relief. Heart rate decreased to 110's. Blood pressure improved.  Patient then began with acute vomiting and heart rate accelerated to 170s. Multiple episodes of uncontrolled with Zofran. Vomit is bilious in color. Abdomen is firm. Will get CT abdomen to rule out acute pathology.  CT with evidence of bilateral hydronephrosis. Heart rate improved in the emergency department. Discussed with [facility] and will admit.  Loren Racer, MD 06/03/15 [phone]

## 2015-06-02 NOTE — ED Notes (Signed)
Pt has a suprabubic catheter and a external catheter on arrival to ED.

## 2015-06-02 NOTE — ED Notes (Signed)
Pt with suprapubic catheter

## 2015-06-02 NOTE — ED Notes (Signed)
CRITICAL VALUE ALERT  Critical value received:  WBC 51.1

## 2015-06-02 NOTE — ED Notes (Signed)
Per EMS, patient's family called because patient is leaking urine from supra pubic catheter site and developed an acute onset of altered mental status. Pt presents to ER with fever and tachcardia. Pt is conscious and able to answer some questions.

## 2015-06-02 NOTE — Consult Note (Addendum)
Name: Samuel [name] MRN: [id number] DOB: Sep 28, 1960    ADMISSION DATE:  06/02/2015 CONSULTATION DATE:  06/02/2015  REFERRING MD :  Hospitalist Service  CHIEF COMPLAINT:  Sepsis  BRIEF PATIENT DESCRIPTION: Independently living 54 year old Caucasian male with a history of multiple sclerosis and spastic quadriparesis. History of neurogenic bladder with suprapubic catheter. Presented with report of altered mental status, tachycardia, & low-grade fever earlier today.  SIGNIFICANT EVENTS  9/28 - Suprapubic catheter changed at home with difficulty 9/30 - Admit to hospital with sepsis  STUDIES:  CT Abd/Pelvis w/ 9/30 - mild bilateral hydronephrosis to level of the urinary bladder & bilateral perinephric stranding. Portable CXR 9/30 - no focal opacity or effusion. Low lung volumes.  HISTORY OF PRESENT ILLNESS:  Patient reports he was in his usual state of health until his suprapubic Foley catheter was changed on 9/28 at home. He reports it was difficult to remove and the home health aide had to "tugg" to get it out. He reports starting that evening he began to experience lower abdominal pain that he qualified as "severe". The pain progressively worsened as did nausea with emesis productive of a green substance. He reports that he was awoken out of sleep overnight last night by his mother having had urine leakage presumably from around his suprapubic Foley catheter. The patient reports he chronically has some anal leakage but did have more diarrhea overnight last night. He denies any subjective fever and has chronic chills as well as sweats. He denies any chest pain or pressure. Since admission the patient suprapubic Foley has been changed by the emergency room physician with significant improvement and resolution of his pain and discomfort. The patient reports he was only recently started on prednisone well. When questioned regarding his prior exposure to penicillin he reports he has taken amoxicillin  since then without any difficulty.  PAST MEDICAL HISTORY :  Past Medical History  Diagnosis Date  . Hypertension   . GERD (gastroesophageal reflux disease)   . Hypothyroidism   . Vitamin D deficiency   . Chronic back pain   . Gait disorder   . 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 Multiple sclerosis NEUROLOGIST-- [name]    dx 1993, JC virus negative 2/14---  CURRENTLY RECEIVING TYSABRI IV TREATMENT  . Pneumonia     dx  03-03-2014--  admitted for iv and oral antibiotics  . Urinary retention   . Neurogenic bladder   . Foley catheter in place   . Incontinence of urine   . Hyperlipidemia   . Bladder calculi   . Shortness of breath     secondary to fatique  . Chronic fatigue   . Neurogenic bowel     intermittant constipation and diarrhea  . Yeast infection     genital area    PAST SURGICAL HISTORY: Past Surgical History  Procedure Laterality Date  . Appendectomy  age 8  . Nasal septum surgery  1996  . Negative sleep study  1996  . Transthoracic echocardiogram  02-07-2004    MILD LVH/  EF 55-65%  . Insertion of suprapubic catheter N/A 03/21/2014    Procedure: INSERTION OF SUPRAPUBIC CATHETER;  Surgeon: [name], MD;  Location: [facility];  Service: Urology;  Laterality: N/A;  . Cystoscopy N/A 03/21/2014    Procedure: CYSTOSCOPY TREATMENT OF BLADDER CALCULI;  Surgeon: [name], MD;  Location: [facility];  Service: Urology;  Laterality: N/A;   Prior to Admission medications   Medication Sig  Start Date End Date Taking? Authorizing Provider  ALPRAZolam Prudy Feeler) 1 MG tablet TAKE ONE TABLET BY MOUTH AT BEDTIME AS NEEDED FOR ANXIETY 05/31/15  Yes York Spaniel, MD  baclofen (LIORESAL) 20 MG tablet take 2 tablets by mouth three times a day 10/07/14  Yes York Spaniel, MD  clonazePAM (KLONOPIN) 1 MG tablet TAKE ONE TABLET BY MOUTH TWICE DAILY 02/23/15  Yes York Spaniel, MD  Cranberry-Vitamin C-Probiotic (AZO CRANBERRY PO) Take 1 tablet  by mouth 2 (two) times daily.   Yes Historical Provider, MD  dantrolene (DANTRIUM) 100 MG capsule TAKE ONE CAPSULE BY MOUTH THREE TIMES DAILY 02/16/15  Yes York Spaniel, MD  diazepam (VALIUM) 10 MG tablet Take 10 mg by mouth at bedtime as needed for anxiety.   Yes Historical Provider, MD  HYDROcodone-acetaminophen (NORCO/VICODIN) 5-325 MG tablet Take 1-2 tablets by mouth every 6 (six) hours as needed. Patient taking differently: Take 1-2 tablets by mouth every 6 (six) hours as needed for moderate pain.  06/01/15  Yes York Spaniel, MD  ibuprofen (ADVIL,MOTRIN) 200 MG tablet Take 600 mg by mouth as needed for fever or moderate pain.    Yes Historical Provider, MD  levothyroxine (SYNTHROID, LEVOTHROID) 125 MCG tablet Take 125 mcg by mouth daily. 05/22/15  Yes Historical Provider, MD  lisinopril-hydrochlorothiazide (PRINZIDE,ZESTORETIC) 10-12.5 MG per tablet Take 0.5 tablets by mouth every morning.    Yes Historical Provider, MD  mirabegron ER (MYRBETRIQ) 50 MG TB24 tablet Take 50 mg by mouth daily.   Yes Historical Provider, MD  omeprazole (PRILOSEC) 20 MG capsule Take 20 mg by mouth every morning.    Yes Historical Provider, MD  OnabotulinumtoxinA (BOTOX IJ) Inject 300 Units as directed every 3 (three) months. Administered at MD office   Yes Historical Provider, MD  perphenazine (TRILAFON) 2 MG tablet TAKE ONE TABLET BY MOUTH TWICE DAILY 01/02/15  Yes York Spaniel, MD  Phenazopyridine HCl (AZO TABS PO) Take by mouth 2 (two) times daily.   Yes Historical Provider, MD  scopolamine (TRANSDERM-SCOP, 1.5 MG,) 1 MG/3DAYS Place 1 patch (1.5 mg total) onto the skin every 3 (three) days. 05/18/15  Yes York Spaniel, MD  sertraline (ZOLOFT) 100 MG tablet Take 200 mg by mouth at bedtime.    Yes Historical Provider, MD  solifenacin (VESICARE) 10 MG tablet Take 10 mg by mouth daily.   Yes Historical Provider, MD  tiZANidine (ZANAFLEX) 4 MG capsule Take 8 mg by mouth 3 (three) times daily.   Yes Historical  Provider, MD  traMADol (ULTRAM) 50 MG tablet Take 1 tablet (50 mg total) by mouth every 6 (six) hours as needed. Patient taking differently: Take 50 mg by mouth every 6 (six) hours as needed for moderate pain.  11/14/14  Yes York Spaniel, MD  Vitamin D, Ergocalciferol, (DRISDOL) 50000 UNITS CAPS Take 50,000 Units by mouth every 7 (seven) days. Friday only   Yes Historical Provider, MD  amitriptyline (ELAVIL) 100 MG tablet take 1 tablet by mouth at bedtime Patient not taking: Reported on 06/02/2015 07/25/14   York Spaniel, MD  predniSONE (DELTASONE) 10 MG tablet Begin taking 6 tablets daily, taper by one tablet every other day until off the medication. Patient not taking: Reported on 06/02/2015 05/18/15   York Spaniel, MD   Allergies  Allergen Reactions  . Penicillins Other (See Comments)    Unknown childhoood reaction (takes amoxicillian with reaction) Has tolerated ceftriaxone, cefepime, and Primaxin during inpatient admissions  FAMILY HISTORY:  Family History  Problem Relation Age of Onset  . Stroke Father   . Cirrhosis Brother   . Hypothyroidism Sister    SOCIAL HISTORY: Social History   Social History  . Marital Status: Divorced    Spouse Name: N/A  . Number of Children: 0  . Years of Education: college   Occupational History  . Curator     disability   Social History Main Topics  . Smoking status: Former Smoker -- 1.50 packs/day for 8 years    Types: Cigarettes    Quit date: 06/17/1985  . Smokeless tobacco: Never Used  . Alcohol Use: No  . Drug Use: No  . Sexual Activity: Not Asked   Other Topics Concern  . None   Social History Narrative   Patient is right handed.   Patient does not drink caffeine.      [facility]:   Patient currently lives alone. He has 3 separate caregivers who help care for him in his own home. His mother and sister also helped in the evening to get him ready for bed. He reports he does have a dog.   REVIEW OF  SYSTEMS:  Denies any rashes or abnormal bruising. Denies any headache or vision changes. A pertinent 14 point review of systems is negative except as per the history of presenting illness.  SUBJECTIVE:   VITAL SIGNS: Temp:  [98.4 F (36.9 C)-100.8 F (38.2 C)] 98.4 F (36.9 C) (09/30 1715) Pulse Rate:  [84-172] 89 (09/30 1715) Resp:  [10-29] 19 (09/30 1715) BP: (98-177)/(60-111) 131/75 mmHg (09/30 1715) SpO2:  [94 %-99 %] 97 % (09/30 1715) Weight:  [134 lb 14.7 oz (61.2 kg)-175 lb (79.379 kg)] 134 lb 14.7 oz (61.2 kg) (09/30 1715)  PHYSICAL EXAMINATION: General:  Awake. Alert. No acute distress. Upright in bed.  Integument:  Warm & dry. No rash on exposed skin. No discharge or erythema surrounding suprapubic catheter. Lymphatics:  No appreciated cervical or supraclavicular lymphadenoapthy. HEENT:  Tacky mucus membranes. No oral ulcers. No scleral injection or icterus. Endotracheal tube in place. PERRL. Cardiovascular:  Regular rate. Bilateral lower extremity edema. No appreciable JVD.  Pulmonary:  Good aeration & clear to auscultation bilaterally. Symmetric chest wall expansion. No accessory muscle use on room air. Abdomen: Soft. Normal bowel sounds. Nondistended. Grossly nontender.Suprapubic catheter in place. Musculoskeletal:  Contractures, particularly of the right upper extremity noted. No joint deformity or effusion appreciated. Neurological:  CN 2-12 grossly in tact. No meningismus.  Psychiatric:  Mood and affect congruent. Speech normal rhythm, rate & tone.    Recent Labs Lab 06/02/15 0500 06/02/15 0925  NA 132*  --   K 3.3*  --   CL 97*  --   CO2 22  --   BUN 15  --   CREATININE 0.81 0.70  GLUCOSE 149*  --     Recent Labs Lab 06/02/15 0500 06/02/15 0925  HGB 17.5* 13.9  HCT 50.4 41.2  WBC 51.1* 32.6*  PLT 256 155   Ct Abdomen Pelvis W Contrast  06/02/2015   CLINICAL DATA:  Nausea and vomiting for 2 days. Fever. Previous appendectomy.  EXAM: CT ABDOMEN AND  PELVIS WITH CONTRAST  TECHNIQUE: Multidetector CT imaging of the abdomen and pelvis was performed using the standard protocol following bolus administration of intravenous contrast.  CONTRAST:  OMNIPAQUE IOHEXOL 300 MG/ML  SOLN  COMPARISON:  07/11/2014  FINDINGS: Lower chest:  No acute findings.  Hepatobiliary: No masses or other significant abnormality. Gallbladder  is unremarkable.  Pancreas: No mass, inflammatory changes, or other significant abnormality.  Spleen: Within normal limits in size and appearance.  Adrenals/Urinary Tract: No masses identified. Mild bilateral hydronephrosis and ureterectasis is seen to the level of the urinary bladder which is new since previous study. Mild perinephric soft tissue stranding and fluid is increased since previous study, although there is no radiographic evidence of focal pyelonephritis. No obstructing ureteral calculi visualized.  Several small calculi are noted in the urinary bladder and a small nonobstructive calculus is also seen in the upper pole the right kidney. Diffuse bladder wall thickening is again demonstrated with suprapubic catheter remaining in place.  Stomach/Bowel: No evidence of obstruction, inflammatory process, or abnormal fluid collections.  Vascular/Lymphatic: No pathologically enlarged lymph nodes. No evidence of abdominal aortic aneurysm.  Reproductive: No mass or other significant abnormality.  Other: None.  Musculoskeletal:  No suspicious bone lesions identified.  IMPRESSION: New mild bilateral hydroureteronephrosis to the level the urinary bladder, and bilateral perinephric stranding and fluid. No obstructing ureteral calculi identified. This may be related to vesicoureteral reflux or chronic bladder outlet obstruction.  Chronic diffuse bladder wall thickening with suprapubic catheter remaining in place.  Small nonobstructive calculi again noted in the urinary bladder and are pole right kidney.   Electronically Signed   By: Myles Rosenthal M.D.    On: 06/02/2015 07:31   Dg Chest Port 1 View  06/02/2015   CLINICAL DATA:  Sepsis. History of hypertension, pneumonia, multiple scleroses, spastic hemiplegia.  EXAM: PORTABLE CHEST 1 VIEW  COMPARISON:  Chest radiograph July 10, 2014  FINDINGS: Patient rotated to the RIGHT.Cardiomediastinal silhouette is unremarkable for this low inspiratory portable examination with crowded vasculature markings. The lungs are clear without pleural effusions or focal consolidations. Trachea projects midline and there is no pneumothorax. Nipple shadow LEFT lung base. Included soft tissue planes and osseous structures are non-suspicious. Similar osteopenia.  IMPRESSION: No acute cardiopulmonary process for this low inspiratory portable examination.   Electronically Signed   By: Awilda Metro M.D.   On: 06/02/2015 05:46    ASSESSMENT / PLAN: 54 year old male with sepsis and acute renal failure likely secondary to suprapubic catheter obstruction. This is also the likely cause for the patient's hydronephrosis noted on the CT scan of his abdomen and pelvis from earlier today. I reviewed the patient's prior cultures which have grown Klebsiella as well as Citrobacter. The patient was initially treated with Rocephin earlier today as well as vancomycin.  1. Sepsis: Likely secondary to urinary tract infection. Blood & urine cultures already obtained. Trending lactic acid every 6 hours. 2. Probable urinary tract infection: Patient switched to imipenem & vancomycin. Recommend tapering of antibiotics to sensitivities once available. 3. Acute renal failure & hydronephrosis: Secondary to suprapubic catheter obstruction. Renal ultrasound pending. Recommend trending BUN/creatinine daily along with urine output. Recommend gentle IV fluid rehydration with normal saline at 125cc/hr as ordered. 4. Altered mental status: Reportedly present on admission. Resolved. 6. Lactic acidosis: Secondary to sepsis & lack of renal clearance from  obstructed suprapubic Foley. Continuing to trend every 6 hours. 7. Elevated troponin I: Likely due to subendocardial ischemia in the setting of sinus tachycardia with lactic acidosis. Continuing to trend every 6 hours. Ordering transthoracic echocardiogram. 8. Hypomagnesemia: Replaced IV. Repeat Mag in AM 10/01. 9. Multiple sclerosis: Home medication regimen per hospitalist service. 10. Prophylaxis: Protonix by mouth daily & Lovenox subcutaneous daily per hospitalist service. 11. Diet: Regular diet.  The patient is stable to continue close monitoring in  the stepdown unit. Will see again in the morning. Please call if there are any further issues overnight.  I have spent a total of 32 minutes of critical care time today caring for this patient and reviewing the patient's electronic medical record.  Donna Christen Jamison Neighbor, M.D. [facility] Pager:  [phone] After 3pm or if no response, call [phone] 06/02/2015, 7:04 PM

## 2015-06-02 NOTE — Progress Notes (Signed)
eLink Physician-Brief Progress Note Patient Name: YEHYA ZILE DOB: 02-20-1961 MRN: [id number]   Date of Service  06/02/2015  HPI/Events of Note  ELINK Sepsis ROUNDS, noted LActic 2.1 rise despite 3 liters Repeat LA rise 4.2 HR has improved and BP is acceptable  eICU Interventions  His pcn allergy is remote and unclear. He has received  Imipenem in past without any issues HIS ABDO CT showed chronic changes, new hydro supr pubic changed Urology was called in am , no note seen as of yet about new hydro  Concern missing organism, prior ABX exposure, chronic supr pubic - dc ceftriaxone, IMI added to vanc Concern new hydro Will also assess renal US and correlate to CT pccm to see at bedside now I notified triad     Intervention Category Major Interventions: Infection - evaluation and management  FEINSTEIN,DANIEL J. 06/02/2015, 6:19 PM

## 2015-06-02 NOTE — Consult Note (Signed)
Subjective: I was asked to see Samuel [name] by Dr. Susie Cassette for bilateral hydro and recent SP tube obstruction with sepsis.   He is a 54 year old male with a history of multiple sclerosis, spastic quadriparesis, neurogenic bladder with chronic suprapubic Foley catheter that was placed in 03/2014 by Dr. [name].  He has had MS since the 90's but only developed retention last year.   His tube is generally changed after 15 days but on Weds this week he began to leak around the catheter and his aid changed the foley which was only 12 days after the last exchange.  He had more leakage of urine around the catheter and developed abdominal pain and leakage of urine from the penis.  He came to the ER with altered mental status and fever which had started about 2 days ago.  He was found to be febrile to 100.8 by EMS with a marked tachycardia. The suprapubic catheter was replaced by ER physician with improvement in the suprapubic pain and discomfort. White blood cell count was found to be 51,000.  A CT was obtained which showed some bilateral hydronephrosis and a thick walled bladder with the SP tube in good position.  The bladder was not distended.    ROS:  Review of Systems  Constitutional: Positive for fever and chills.  Cardiovascular: Positive for leg swelling (feet).  Gastrointestinal: Positive for abdominal pain and constipation.  Musculoskeletal: Positive for myalgias.  Neurological: Positive for headaches.       Spastic quadriplegia  All other systems reviewed and are negative.   Allergies  Allergen Reactions  . Penicillins Other (See Comments)    Unknown childhoood reaction (takes amoxicillian with reaction) Has tolerated ceftriaxone, cefepime, and Primaxin during inpatient admissions    Past Medical History  Diagnosis Date  . Hypertension   . GERD (gastroesophageal reflux disease)   . Hypothyroidism   . Vitamin D deficiency   . Chronic back pain   . Gait disorder   . Spastic quadriparesis  04/19/2013  . Multiple sclerosis NEUROLOGIST-- [name]    dx 1993, JC virus negative 2/14---  CURRENTLY RECEIVING TYSABRI IV TREATMENT  . Pneumonia     dx  03-03-2014--  admitted for iv and oral antibiotics  . Urinary retention   . Neurogenic bladder   . Foley catheter in place   . Incontinence of urine   . Hyperlipidemia   . Bladder calculi   . Shortness of breath     secondary to fatique  . Chronic fatigue   . Neurogenic bowel     intermittant constipation and diarrhea  . Yeast infection     genital area    Past Surgical History  Procedure Laterality Date  . Appendectomy  age 6  . Nasal septum surgery  1996  . Negative sleep study  1996  . Transthoracic echocardiogram  02-07-2004    MILD LVH/  EF 55-65%  . Insertion of suprapubic catheter N/A 03/21/2014    Procedure: INSERTION OF SUPRAPUBIC CATHETER;  Surgeon: [name], MD;  Location: [facility];  Service: Urology;  Laterality: N/A;  . Cystoscopy N/A 03/21/2014    Procedure: CYSTOSCOPY TREATMENT OF BLADDER CALCULI;  Surgeon: [name], MD;  Location: [facility];  Service: Urology;  Laterality: N/A;    Social History   Social History  . Marital Status: Divorced    Spouse Name: N/A  . Number of Children: 0  . Years of Education: college   Occupational  History  . Curator     disability   Social History Main Topics  . Smoking status: Former Smoker -- 1.50 packs/day for 8 years    Types: Cigarettes    Quit date: 06/17/1985  . Smokeless tobacco: Never Used  . Alcohol Use: No  . Drug Use: No  . Sexual Activity: Not on file   Other Topics Concern  . Not on file   Social History Narrative   Patient is right handed.   Patient does not drink caffeine.      [facility]:   Patient currently lives alone. He has 3 separate caregivers who help care for him in his own home. His mother and sister also helped in the evening to get him ready for bed. He reports he does  have a dog.    Family History  Problem Relation Age of Onset  . Stroke Father   . Cirrhosis Brother   . Hypothyroidism Sister    PSFHx reviewed.   Anti-infectives: Anti-infectives    Start     Dose/Rate Route Frequency Ordered Stop   06/03/15 0600  cefTRIAXone (ROCEPHIN) 1 g in dextrose 5 % 50 mL IVPB  Status:  Discontinued     1 g 100 mL/hr over 30 Minutes Intravenous Every 24 hours 06/02/15 1541 06/02/15 1813   06/02/15 2200  imipenem-cilastatin (PRIMAXIN) 500 mg in sodium chloride 0.9 % 100 mL IVPB     500 mg 200 mL/hr over 30 Minutes Intravenous 3 times per day 06/02/15 1832     06/02/15 2000  vancomycin (VANCOCIN) IVPB 750 mg/150 ml premix     750 mg 150 mL/hr over 60 Minutes Intravenous Every 8 hours 06/02/15 1834     06/02/15 0815  vancomycin (VANCOCIN) IVPB 1000 mg/200 mL premix     1,000 mg 200 mL/hr over 60 Minutes Intravenous  Once 06/02/15 0813 06/02/15 0939   06/02/15 0545  cefTRIAXone (ROCEPHIN) 1 g in dextrose 5 % 50 mL IVPB     1 g 100 mL/hr over 30 Minutes Intravenous  Once 06/02/15 0542 06/02/15 0646      Current Facility-Administered Medications  Medication Dose Route Frequency Provider Last Rate Last Dose  . 0.9 % NaCl with KCl 20 mEq/ L  infusion   Intravenous Continuous Richarda Overlie, MD 125 mL/hr at 06/02/15 1824    . acetaminophen (TYLENOL) tablet 650 mg  650 mg Oral Q6H PRN Richarda Overlie, MD       Or  . acetaminophen (TYLENOL) suppository 650 mg  650 mg Rectal Q6H PRN Richarda Overlie, MD      . baclofen (LIORESAL) tablet 40 mg  40 mg Oral TID Richarda Overlie, MD   40 mg at 06/02/15 1816  . dantrolene (DANTRIUM) capsule 100 mg  100 mg Oral TID Richarda Overlie, MD   100 mg at 06/02/15 2153  . darifenacin (ENABLEX) 24 hr tablet 7.5 mg  7.5 mg Oral Daily Richarda Overlie, MD   7.5 mg at 06/02/15 1819  . diazepam (VALIUM) tablet 2 mg  2 mg Oral Q8H PRN Richarda Overlie, MD      . enoxaparin (LOVENOX) injection 40 mg  40 mg Subcutaneous Q24H Richarda Overlie, MD   40 mg at  06/02/15 1743  . HYDROcodone-acetaminophen (NORCO/VICODIN) 5-325 MG per tablet 1-2 tablet  1-2 tablet Oral Q6H PRN Richarda Overlie, MD   1 tablet at 06/02/15 2153  . imipenem-cilastatin (PRIMAXIN) 500 mg in sodium chloride 0.9 % 100 mL IVPB  500 mg Intravenous 3  times per day Richarda Overlie, MD      . levalbuterol Pauline Aus) nebulizer solution 0.63 mg  0.63 mg Nebulization Q6H PRN Richarda Overlie, MD      . Melene Muller ON 06/03/2015] levothyroxine (SYNTHROID, LEVOTHROID) tablet 125 mcg  125 mcg Oral QAC breakfast Richarda Overlie, MD      . magnesium sulfate 3 g in dextrose 5 % 100 mL IVPB  3 g Intravenous Once Nelda Bucks, MD      . mirabegron ER [facility]) tablet 50 mg  50 mg Oral Daily Richarda Overlie, MD   50 mg at 06/02/15 1825  . ondansetron (ZOFRAN) tablet 4 mg  4 mg Oral Q6H PRN Richarda Overlie, MD       Or  . ondansetron (ZOFRAN) injection 4 mg  4 mg Intravenous Q6H PRN Richarda Overlie, MD   4 mg at 06/02/15 1400  . pantoprazole (PROTONIX) EC tablet 40 mg  40 mg Oral Daily Richarda Overlie, MD   40 mg at 06/02/15 1331  . perphenazine (TRILAFON) tablet 2 mg  2 mg Oral BID Richarda Overlie, MD   2 mg at 06/02/15 2153  . phenazopyridine (PYRIDIUM) tablet 100 mg  100 mg Oral TID WC Richarda Overlie, MD   100 mg at 06/02/15 1818  . scopolamine (TRANSDERM-SCOP) 1 MG/3DAYS 1.5 mg  1 patch Transdermal Q72H Richarda Overlie, MD   1.5 mg at 06/02/15 1807  . tiZANidine (ZANAFLEX) tablet 8 mg  8 mg Oral TID Richarda Overlie, MD   8 mg at 06/02/15 1815  . vancomycin (VANCOCIN) IVPB 750 mg/150 ml premix  750 mg Intravenous Q8H Richarda Overlie, MD         Objective: Vital signs in last 24 hours: Temp:  [97.9 F (36.6 C)-100.8 F (38.2 C)] 97.9 F (36.6 C) (09/30 2137) Pulse Rate:  [84-172] 93 (09/30 2137) Resp:  [10-29] 27 (09/30 2137) BP: (98-177)/(60-111) 116/72 mmHg (09/30 2137) SpO2:  [94 %-99 %] 97 % (09/30 2137) Weight:  [61.2 kg (134 lb 14.7 oz)-79.379 kg (175 lb)] 61.2 kg (134 lb 14.7 oz) (09/30 1715)  Intake/Output from  previous day:   Intake/Output this shift: Total I/O In: -  Out: 450 [Urine:450]   Physical Exam  Constitutional: He is oriented to person, place, and time and well-developed, well-nourished, and in no distress.  HENT:  Head: Normocephalic and atraumatic.  Cardiovascular: Normal rate, regular rhythm and normal heart sounds.   Pulmonary/Chest: Effort normal and breath sounds normal. No respiratory distress.  Abdominal: Soft. Bowel sounds are normal. He exhibits no distension and no mass. There is tenderness (mild around SP site.  No CVAT. ). There is no guarding.  Suprapubic tube in place draining concentrated but clear urine.   Genitourinary:  Normal phallus with condom cath.  Scrotum and contents normal.  Musculoskeletal: He exhibits edema (mild in feet). He exhibits no tenderness.  Neurological: He is alert and oriented to person, place, and time.  Spastic quadriplegia  Skin: Skin is warm and dry.  Psychiatric: Mood and affect normal.    Lab Results:   Recent Labs  06/02/15 0500 06/02/15 0925  WBC 51.1* 32.6*  HGB 17.5* 13.9  HCT 50.4 41.2  PLT 256 155   BMET  Recent Labs  06/02/15 0500 06/02/15 0925  NA 132*  --   K 3.3*  --   CL 97*  --   CO2 22  --   GLUCOSE 149*  --   BUN 15  --   CREATININE 0.81 0.70  CALCIUM 9.3  --    PT/INR No results for input(s): LABPROT, INR in the last 72 hours. ABG No results for input(s): PHART, HCO3 in the last 72 hours.  Invalid input(s): PCO2, PO2  Studies/Results: Ct Abdomen Pelvis W Contrast  06/02/2015   CLINICAL DATA:  Nausea and vomiting for 2 days. Fever. Previous appendectomy.  EXAM: CT ABDOMEN AND PELVIS WITH CONTRAST  TECHNIQUE: Multidetector CT imaging of the abdomen and pelvis was performed using the standard protocol following bolus administration of intravenous contrast.  CONTRAST:  OMNIPAQUE IOHEXOL 300 MG/ML  SOLN  COMPARISON:  07/11/2014  FINDINGS: Lower chest:  No acute findings.  Hepatobiliary: No  masses or other significant abnormality. Gallbladder is unremarkable.  Pancreas: No mass, inflammatory changes, or other significant abnormality.  Spleen: Within normal limits in size and appearance.  Adrenals/Urinary Tract: No masses identified. Mild bilateral hydronephrosis and ureterectasis is seen to the level of the urinary bladder which is new since previous study. Mild perinephric soft tissue stranding and fluid is increased since previous study, although there is no radiographic evidence of focal pyelonephritis. No obstructing ureteral calculi visualized.  Several small calculi are noted in the urinary bladder and a small nonobstructive calculus is also seen in the upper pole the right kidney. Diffuse bladder wall thickening is again demonstrated with suprapubic catheter remaining in place.  Stomach/Bowel: No evidence of obstruction, inflammatory process, or abnormal fluid collections.  Vascular/Lymphatic: No pathologically enlarged lymph nodes. No evidence of abdominal aortic aneurysm.  Reproductive: No mass or other significant abnormality.  Other: None.  Musculoskeletal:  No suspicious bone lesions identified.  IMPRESSION: New mild bilateral hydroureteronephrosis to the level the urinary bladder, and bilateral perinephric stranding and fluid. No obstructing ureteral calculi identified. This may be related to vesicoureteral reflux or chronic bladder outlet obstruction.  Chronic diffuse bladder wall thickening with suprapubic catheter remaining in place.  Small nonobstructive calculi again noted in the urinary bladder and are pole right kidney.   Electronically Signed   By: Myles Rosenthal M.D.   On: 06/02/2015 07:31   Dg Chest Port 1 View  06/02/2015   CLINICAL DATA:  Sepsis. History of hypertension, pneumonia, multiple scleroses, spastic hemiplegia.  EXAM: PORTABLE CHEST 1 VIEW  COMPARISON:  Chest radiograph July 10, 2014  FINDINGS: Patient rotated to the RIGHT.Cardiomediastinal silhouette is  unremarkable for this low inspiratory portable examination with crowded vasculature markings. The lungs are clear without pleural effusions or focal consolidations. Trachea projects midline and there is no pneumothorax. Nipple shadow LEFT lung base. Included soft tissue planes and osseous structures are non-suspicious. Similar osteopenia.  IMPRESSION: No acute cardiopulmonary process for this low inspiratory portable examination.   Electronically Signed   By: Awilda Metro M.D.   On: 06/02/2015 05:46   UA is Nit + with TNTC WBC's.  UCx is pending.   Labs and CT report and films reviewed.  Case discuss the Dr. Susie Cassette.   Assessment: He has probable sepsis from a UTI related to an obstructed SP tube.  He has mild bilateral hydro that could be from chronic bladder wall thickening from the chronic catheterization and NGB or from recent distention that is resolving.  He has small bladder and right renal stones.   A renal US in a day or two would be worthwhile to reassess the hydro.  He is having to change the SP tube every 15 days as a rule.  He would probably benefit from irrigation of the tube every other day with  renacidin to reduce encrustation.  I will follow.    CC: Dr. Richarda Overlie.     [name],[name] 06/02/2015 [phone]

## 2015-06-03 ENCOUNTER — Inpatient Hospital Stay (HOSPITAL_COMMUNITY): Payer: Medicare Other

## 2015-06-03 DIAGNOSIS — E440 Moderate protein-calorie malnutrition: Secondary | ICD-10-CM

## 2015-06-03 DIAGNOSIS — G92 Toxic encephalopathy: Secondary | ICD-10-CM

## 2015-06-03 DIAGNOSIS — I4891 Unspecified atrial fibrillation: Secondary | ICD-10-CM

## 2015-06-03 DIAGNOSIS — G35 Multiple sclerosis: Secondary | ICD-10-CM

## 2015-06-03 DIAGNOSIS — A4151 Sepsis due to Escherichia coli [E. coli]: Secondary | ICD-10-CM

## 2015-06-03 DIAGNOSIS — R509 Fever, unspecified: Secondary | ICD-10-CM

## 2015-06-03 DIAGNOSIS — N319 Neuromuscular dysfunction of bladder, unspecified: Secondary | ICD-10-CM

## 2015-06-03 LAB — COMPREHENSIVE METABOLIC PANEL
ALT: 12 U/L — ABNORMAL LOW (ref 17–63)
AST: 17 U/L (ref 15–41)
Albumin: 2.7 g/dL — ABNORMAL LOW (ref 3.5–5.0)
Alkaline Phosphatase: 53 U/L (ref 38–126)
Anion gap: 6 (ref 5–15)
BUN: 6 mg/dL (ref 6–20)
CO2: 25 mmol/L (ref 22–32)
Calcium: 8.5 mg/dL — ABNORMAL LOW (ref 8.9–10.3)
Chloride: 106 mmol/L (ref 101–111)
Creatinine, Ser: 0.39 mg/dL — ABNORMAL LOW (ref 0.61–1.24)
GFR calc Af Amer: >60 mL/min (ref >60)
GFR calc non Af Amer: >60 mL/min (ref >60)
Glucose, Bld: 109 mg/dL — ABNORMAL HIGH (ref 65–99)
Potassium: 2.9 mmol/L — ABNORMAL LOW (ref 3.5–5.1)
Sodium: 137 mmol/L (ref 135–145)
Total Bilirubin: 0.6 mg/dL (ref 0.3–1.2)
Total Protein: 4.7 g/dL — ABNORMAL LOW (ref 6.5–8.1)

## 2015-06-03 LAB — CBC
HCT: 37.9 % — ABNORMAL LOW (ref 39.0–52.0)
Hemoglobin: 12.5 g/dL — ABNORMAL LOW (ref 13.0–17.0)
MCH: 28.9 pg (ref 26.0–34.0)
MCHC: 33 g/dL (ref 30.0–36.0)
MCV: 87.7 fL (ref 78.0–100.0)
Platelets: 139 10*3/uL — ABNORMAL LOW (ref 150–400)
RBC: 4.32 MIL/uL (ref 4.22–5.81)
RDW: 14.8 % (ref 11.5–15.5)
WBC: 21.7 10*3/uL — ABNORMAL HIGH (ref 4.0–10.5)

## 2015-06-03 LAB — LACTIC ACID, PLASMA
Lactic Acid, Venous: 1.4 mmol/L (ref 0.5–2.0)
Lactic Acid, Venous: 1.6 mmol/L (ref 0.5–2.0)
Lactic Acid, Venous: 3.2 mmol/L (ref 0.5–2.0)

## 2015-06-03 LAB — MAGNESIUM: Magnesium: 2.7 mg/dL — ABNORMAL HIGH (ref 1.7–2.4)

## 2015-06-03 MED ORDER — ENSURE ENLIVE PO LIQD
237.0000 mL | Freq: Two times a day (BID) | ORAL | Status: DC
  Administered 2015-06-03 (×2): 237 mL via ORAL

## 2015-06-03 MED ORDER — CLONAZEPAM 1 MG PO TABS
1.0000 mg | ORAL_TABLET | Freq: Two times a day (BID) | ORAL | Status: DC
  Administered 2015-06-03 – 2015-06-05 (×5): 1 mg via ORAL
  Filled 2015-06-03 (×5): qty 1

## 2015-06-03 MED ORDER — SERTRALINE HCL 100 MG PO TABS
200.0000 mg | ORAL_TABLET | Freq: Every day | ORAL | Status: DC
  Administered 2015-06-03 – 2015-06-04 (×2): 200 mg via ORAL
  Filled 2015-06-03 (×3): qty 2

## 2015-06-03 MED ORDER — ALPRAZOLAM 0.5 MG PO TABS
1.0000 mg | ORAL_TABLET | Freq: Every evening | ORAL | Status: DC | PRN
  Administered 2015-06-03: 1 mg via ORAL
  Filled 2015-06-03: qty 2

## 2015-06-03 MED ORDER — POTASSIUM CHLORIDE 10 MEQ/100ML IV SOLN
10.0000 meq | INTRAVENOUS | Status: AC
  Administered 2015-06-03 (×4): 10 meq via INTRAVENOUS
  Filled 2015-06-03: qty 100

## 2015-06-03 MED ORDER — ACETIC ACID 0.25 % IR SOLN: Freq: Every day | Status: AC

## 2015-06-03 MED ORDER — POTASSIUM CHLORIDE CRYS ER 20 MEQ PO TBCR
30.0000 meq | EXTENDED_RELEASE_TABLET | Freq: Once | ORAL | Status: AC
  Administered 2015-06-03: 30 meq via ORAL
  Filled 2015-06-03: qty 1

## 2015-06-03 NOTE — Progress Notes (Signed)
[place] TEAM 1 - Stepdown/ICU TEAM PROGRESS NOTE  Samuel [name] WUJ:[id number] DOB: 1960/12/27 DOA: 06/02/2015 PCP: [name], MD  Admit HPI / Brief Narrative: 54 year old male with a history of multiple sclerosis, spastic quadriparesis, neurogenic bladder with chronic suprapubic Foley catheter who presented with altered mental status and fever. Family called EMS because of leaking urine from the suprapubic catheter and confusion that started 2 days prior. Per EMS the patient had a temp of 100.8 and was tachycardic at 140. Patient complained of lower abdominal discomfort. In the ER the patient's suprapubic catheter appeared to be blocked with leakage of urine for around catheter site.. Suprapubic catheter was replaced by ER physician which improved the patient's suprapubic pain and discomfort. White blood cell count was found to be 51,000.   He has had a suprapubic catheter since November 2015.  He has severe bladder muscle spasm, leading to urinary incontinence and therefore continues using condom catheter.   HPI/Subjective: Pt states he is feeling much better already.  He is sore from being in bed, and notes lower abdom cramping type pain.  He denies sob, cp, n/v, or ha.   Assessment/Plan:  Severe Sepsis due to UTI w/ Gram+ cocci bacteremia  Cont imipenem & vancomycin - f/u speciation - clinically much improved - lactate normalized   Lactic acidosis Secondary to sepsis & lack of renal clearance from obstructed suprapubic Foley - now normalized   Acute renal failure w/ hydronephrosis Secondary to suprapubic catheter obstruction - Urology has seen - hydro is felt to be mild - for repeat eval w/ [place] in a day or two - monitor UOP  Altered mental status Resolved - now at baseline MS   Elevated troponin I Likely due to subendocardial ischemia in the setting of sinus tachycardia with lactic acidosis - trending down - no chest pain   Hypomagnesemia Replace and follow    Hypokalemia  Replace and follow   Multiple sclerosis Home medication regimen to continue - is non-ambulatory   Malnutrition of moderate degree  Code Status: FULL Family Communication: no family present at time of exam Disposition Plan: SDU  Consultants: PCCM Urology   Procedures: none  Antibiotics: Imipenem 9/30 > Vanc 9/30 > Rocephin 9/29  DVT prophylaxis: lovenox   Objective: Blood pressure 116/72, pulse 77, temperature 98.4 F (36.9 C), temperature source Oral, resp. rate 18, height  (1.854 m), weight 61.2 kg (134 lb 14.7 oz), SpO2 99 %.  Intake/Output Summary (Last 24 hours) at 06/03/15 0753 Last data filed at 06/03/15 0751  Gross per 24 hour  Intake 2333.33 ml  Output   4564 ml  Net -2230.67 ml   Exam: General: No acute respiratory distress Lungs: Clear to auscultation bilaterally without wheezes or crackles Cardiovascular: Regular rate and rhythm without murmur gallop or rub normal S1 and S2 Abdomen: mildly tender B low quads, urinary cath insertion clean/dry , nondistended, soft, bowel sounds positive, no rebound, no ascites, no appreciable mass Extremities: No significant cyanosis, clubbing, or edema bilateral lower extremities  Data Reviewed: Basic Metabolic Panel:  Recent Labs Lab 06/02/15 0500 06/02/15 0925 06/03/15 0040  NA 132*  --  137  K 3.3*  --  2.9*  CL 97*  --  106  CO2 22  --  25  GLUCOSE 149*  --  109*  BUN 15  --  6  CREATININE 0.81 0.70 0.39*  CALCIUM 9.3  --  8.5*  MG  --  1.6* 2.7*    CBC:  Recent Labs Lab 06/02/15 0500 06/02/15 0925 06/03/15 0040  WBC 51.1* 32.6* 21.7*  NEUTROABS 46.5*  --   --   HGB 17.5* 13.9 12.5*  HCT 50.4 41.2 37.9*  MCV 87.5 88.6 87.7  PLT 256 155 139*    Liver Function Tests:  Recent Labs Lab 06/02/15 0500 06/03/15 0040  AST 32 17  ALT 19 12*  ALKPHOS 80 53  BILITOT 1.3* 0.6  PROT 6.0* 4.7*  ALBUMIN 3.9 2.7*   Cardiac Enzymes:  Recent Labs Lab 06/02/15 0925  06/02/15 1555 06/02/15 2033  TROPONINI 0.38* 0.24* 0.20*    Recent Results (from the past 240 hour(s))  Blood Culture (routine x 2)     Status: None (Preliminary result)   Collection Time: 06/02/15  5:00 AM  Result Value Ref Range Status   Specimen Description BLOOD WRIST LEFT  Final   Special Requests BOTTLES DRAWN AEROBIC AND ANAEROBIC 5CC  Final   Culture  Setup Time   Final    GRAM POSITIVE COCCI IN CLUSTERS IN BOTH AEROBIC AND ANAEROBIC BOTTLES CRITICAL RESULT CALLED TO, READ BACK BY AND VERIFIED WITH: WILSON@[id number]  06/03/15 MKELLY    Culture PENDING  Incomplete   Report Status PENDING  Incomplete  Blood Culture (routine x 2)     Status: None (Preliminary result)   Collection Time: 06/02/15  5:00 AM  Result Value Ref Range Status   Specimen Description BLOOD ARM RIGHT  Final   Special Requests BOTTLES DRAWN AEROBIC AND ANAEROBIC 10CC  Final   Culture  Setup Time   Final    GRAM NEGATIVE RODS IN BOTH AEROBIC AND ANAEROBIC BOTTLES CRITICAL RESULT CALLED TO, READ BACK BY AND VERIFIED WITH: A WILSON RN 1930 06/02/15 A BROWNING    Culture PENDING  Incomplete   Report Status PENDING  Incomplete     Studies:   Recent x-ray studies have been reviewed in detail by the Attending Physician  Scheduled Meds:  Scheduled Meds: . baclofen  40 mg Oral TID  . dantrolene  100 mg Oral TID  . darifenacin  7.5 mg Oral Daily  . enoxaparin (LOVENOX) injection  40 mg Subcutaneous Q24H  . imipenem-cilastatin  500 mg Intravenous 3 times per day  . levothyroxine  125 mcg Oral QAC breakfast  . mirabegron ER  50 mg Oral Daily  . pantoprazole  40 mg Oral Daily  . perphenazine  2 mg Oral BID  . phenazopyridine  100 mg Oral TID WC  . scopolamine  1 patch Transdermal Q72H  . tiZANidine  8 mg Oral TID  . vancomycin  750 mg Intravenous Q8H    Time spent on care of this patient: 35 mins   Nichael Ehly T , MD   [facility] Office  [phone] Pager - Text Page per Loretha Stapler as per  below:  On-Call/Text Page:      [email or web address]      password [email or web address]  If 7PM-7AM, please contact night-coverage [email or web address] Password [email or web address] 06/03/2015, 7:53 AM   LOS: 1 day

## 2015-06-03 NOTE — Progress Notes (Signed)
Name: Samuel [name] MRN: [id number] DOB: 12/07/1960    ADMISSION DATE:  06/02/2015 CONSULTATION DATE:  06/02/2015  REFERRING MD :  Hospitalist Service  CHIEF COMPLAINT:  Sepsis  BRIEF PATIENT DESCRIPTION: Independently living 54 year old Caucasian male with a history of multiple sclerosis and spastic quadriparesis. History of neurogenic bladder with suprapubic catheter. Presented with report of altered mental status, tachycardia, & low-grade fever earlier today.  SIGNIFICANT EVENTS  9/28 - Suprapubic catheter changed at home with difficulty 9/30 - Admit to hospital with sepsis 10/1- BC pos GPC clusters  STUDIES:  CT Abd/Pelvis w/ 9/30 - mild bilateral hydronephrosis to level of the urinary bladder & bilateral perinephric stranding. Portable CXR 9/30 - no focal opacity or effusion. Low lung volumes.   SUBJECTIVE:  Still a little lower abd tenderness.   VITAL SIGNS: Temp:  [97.9 F (36.6 C)-98.6 F (37 C)] 98.4 F (36.9 C) (10/01 0700) Pulse Rate:  [77-102] 102 (10/01 0800) Resp:  [10-27] 27 (10/01 0800) BP: (98-153)/(60-88) 153/88 mmHg (10/01 0800) SpO2:  [92 %-99 %] 99 % (10/01 0800) Weight:  [61.2 kg (134 lb 14.7 oz)] 61.2 kg (134 lb 14.7 oz) (09/30 1715)  PHYSICAL EXAMINATION: General:  Awake. Alert. No acute distress. Upright in bed. Conversational Integument:  Warm & dry. No rash on exposed skin. No discharge or erythema surrounding suprapubic catheter. Lymphatics:  No appreciated cervical or supraclavicular lymphadenoapthy. HEENT:  . No oral ulcers. No scleral injection or icterus. PERRL. Cardiovascular:  Regular rate. Bilateral lower extremity edema. No appreciable JVD.  Pulmonary:  Good aeration & clear to auscultation bilaterally. Symmetric chest wall expansion. No accessory muscle use on room air. Abdomen: Soft. Normal bowel sounds. Nondistended. Grossly nontender.Suprapubic catheter in place. Musculoskeletal:  Contractures, particularly of the right upper  extremity noted. No joint deformity or effusion appreciated. Neurological:  CN 2-12 grossly in tact. No meningismus.  Psychiatric:  Mood and affect congruent. Speech normal rhythm, rate & tone.    Recent Labs Lab 06/02/15 0500 06/02/15 0925 06/03/15 0040  NA 132*  --  137  K 3.3*  --  2.9*  CL 97*  --  106  CO2 22  --  25  BUN 15  --  6  CREATININE 0.81 0.70 0.39*  GLUCOSE 149*  --  109*    Recent Labs Lab 06/02/15 0500 06/02/15 0925 06/03/15 0040  HGB 17.5* 13.9 12.5*  HCT 50.4 41.2 37.9*  WBC 51.1* 32.6* 21.7*  PLT 256 155 139*   Ct Abdomen Pelvis W Contrast  06/02/2015   CLINICAL DATA:  Nausea and vomiting for 2 days. Fever. Previous appendectomy.  EXAM: CT ABDOMEN AND PELVIS WITH CONTRAST  TECHNIQUE: Multidetector CT imaging of the abdomen and pelvis was performed using the standard protocol following bolus administration of intravenous contrast.  CONTRAST:  OMNIPAQUE IOHEXOL 300 MG/ML  SOLN  COMPARISON:  07/11/2014  FINDINGS: Lower chest:  No acute findings.  Hepatobiliary: No masses or other significant abnormality. Gallbladder is unremarkable.  Pancreas: No mass, inflammatory changes, or other significant abnormality.  Spleen: Within normal limits in size and appearance.  Adrenals/Urinary Tract: No masses identified. Mild bilateral hydronephrosis and ureterectasis is seen to the level of the urinary bladder which is new since previous study. Mild perinephric soft tissue stranding and fluid is increased since previous study, although there is no radiographic evidence of focal pyelonephritis. No obstructing ureteral calculi visualized.  Several small calculi are noted in the urinary bladder and a small nonobstructive calculus is also seen  in the upper pole the right kidney. Diffuse bladder wall thickening is again demonstrated with suprapubic catheter remaining in place.  Stomach/Bowel: No evidence of obstruction, inflammatory process, or abnormal fluid collections.   Vascular/Lymphatic: No pathologically enlarged lymph nodes. No evidence of abdominal aortic aneurysm.  Reproductive: No mass or other significant abnormality.  Other: None.  Musculoskeletal:  No suspicious bone lesions identified.  IMPRESSION: New mild bilateral hydroureteronephrosis to the level the urinary bladder, and bilateral perinephric stranding and fluid. No obstructing ureteral calculi identified. This may be related to vesicoureteral reflux or chronic bladder outlet obstruction.  Chronic diffuse bladder wall thickening with suprapubic catheter remaining in place.  Small nonobstructive calculi again noted in the urinary bladder and are pole right kidney.   Electronically Signed   By: Myles Rosenthal M.D.   On: 06/02/2015 07:31   US Renal  06/02/2015   CLINICAL DATA:  54 year old male with mild hydronephrosis identified on recent CT.  EXAM: RENAL / URINARY TRACT ULTRASOUND COMPLETE  COMPARISON:  06/02/2015 CT  FINDINGS: Right Kidney:  Length: 11.5 cm. Echogenicity within normal limits. No mass or hydronephrosis visualized.  Left Kidney:  Length: 10.5 cm. Echogenicity within normal limits. No mass or hydronephrosis visualized.  Bladder:  A catheter is identified within a collapsed bladder.  IMPRESSION: No evidence of hydronephrosis identified on this study. Kidneys are unremarkable.  Catheter identified within the bladder.   Electronically Signed   By: Harmon Pier M.D.   On: 06/02/2015 23:39   Dg Chest Port 1 View  06/02/2015   CLINICAL DATA:  Sepsis. History of hypertension, pneumonia, multiple scleroses, spastic hemiplegia.  EXAM: PORTABLE CHEST 1 VIEW  COMPARISON:  Chest radiograph July 10, 2014  FINDINGS: Patient rotated to the RIGHT.Cardiomediastinal silhouette is unremarkable for this low inspiratory portable examination with crowded vasculature markings. The lungs are clear without pleural effusions or focal consolidations. Trachea projects midline and there is no pneumothorax. Nipple shadow LEFT  lung base. Included soft tissue planes and osseous structures are non-suspicious. Similar osteopenia.  IMPRESSION: No acute cardiopulmonary process for this low inspiratory portable examination.   Electronically Signed   By: Awilda Metro M.D.   On: 06/02/2015 05:46    ASSESSMENT / PLAN: 54 year old male with sepsis and acute renal failure likely secondary to suprapubic catheter obstruction. This is also the likely cause for the patient's hydronephrosis noted on the CT scan of his abdomen and pelvis from earlier today. I reviewed the patient's prior cultures which have grown Klebsiella as well as Citrobacter. The patient was initially treated with Rocephin earlier today as well as vancomycin.  1. Sepsis: Likely secondary to urinary tract infection. Blood & urine cultures already obtained.GPCs in blood should be covered by current abx 2. Probable urinary tract infection: Patient switched to imipenem & vancomycin. Recommend tapering of antibiotics to sensitivities once available. 3. Acute renal failure & hydronephrosis: Secondary to suprapubic catheter obstruction. Renal ultrasound pending. Recommend trending BUN/creatinine daily along with urine output. Recommend gentle IV fluid rehydration with normal saline at 125cc/hr as ordered. 4. Altered mental status: Reportedly present on admission. Resolved. 6. Lactic acidosis: Secondary to sepsis & lack of renal clearance from obstructed suprapubic Foley. Continuing to trend every 6 hours. 7. Elevated troponin I: Likely due to subendocardial ischemia in the setting of sinus tachycardia with lactic acidosis. Continuing to trend every 6 hours. Ordering transthoracic echocardiogram. 8. Hypomagnesemia: Replaced IV. Repeat Mag in AM 10/01. 9. Multiple sclerosis: Home medication regimen per hospitalist service. 10. Prophylaxis: Protonix by  mouth daily & Lovenox subcutaneous daily per hospitalist service. 11. Diet: Regular diet.    Terisa Starr, M.D. [facility]  Pulmonary & Critical Care Pager:  [phone] After 3pm or if no response, call [phone] 06/03/2015, 9:23 AM

## 2015-06-03 NOTE — Progress Notes (Signed)
  Echocardiogram 2D Echocardiogram has been performed.  Samuel [name] 06/03/2015, 12:35 PM

## 2015-06-03 NOTE — Progress Notes (Signed)
Initial Nutrition Assessment  DOCUMENTATION CODES:   Not applicable  INTERVENTION:   Ensure Enlive po BID, each supplement provides 350 kcal and 20 grams of protein  NUTRITION DIAGNOSIS:   Inadequate oral intake related to poor appetite, nausea as evidenced by meal completion < 25%.  GOAL:   Patient will meet greater than or equal to 90% of their needs  MONITOR:   PO intake, Supplement acceptance, Labs, Weight trends, Skin, I & O's  REASON FOR ASSESSMENT:   Malnutrition Screening Tool    ASSESSMENT:   54 year old male with a history of multiple sclerosis, spastic quadriparesis, neurogenic bladder with chronic suprapubic Foley catheter who presents with altered mental status, fever at home. Family called EMS because of leaking urine from the suprapubic catheter and confusion that started 2 days ago. Per EMS the patient also had a fever of 100.8 and was tachycardic into the 140s. Patient also complained of lower abdominal discomfort. In the ER the patient's suprapubic catheter appeared to be blocked with leakage of urine for around catheter site.. Suprapubic catheter was replaced by ER physician which improved the patient's suprapubic pain and discomfort. White blood cell count was found to be 51,000. Sepsis protocol was initiated.  Pt admitted with sepsis secondary to UTI.   Hx obtained by pt at bedside. He reveals PTA he was eating one meal per day. He reports he has caregivers who cook for him (one of whom is Investment banker, operational) and will provide him "anything and everything". He continues to endorse poor appetite, revealing the limiting factor is his nausea, which he experiences when he sits up (which he attributes to vertigo). Breakfast tray at bedside has been unattempted. Pt denies any difficulty chewing or swallowing foods.   Pt is unsure if he has lost lost, however, suspects that current wt reading of 134# is inaccurate. He reveals UBW of 175#. Wt verified by RD at bedscale at 142#.    Nutrition-Focused physical exam completed. Findings are moderate to severe fat depletion, moderate to severe muscle depletion, and moderate edema. Due to pt's quadriplegia and MS, it is difficult to determine whether fat or muscle loss is related to suboptimal nutrition status vs limited mobility.  Discussed importance of good nutritional intake to support healing. Pt has used nutritional supplements in the past and is amenable to using during hospitalization. He would like to try Ensure, due to increased calorie and protein content. RD to order.  Labs reviewed: K: 2.9 (on IV supplementation). Mg: 2.7.   Diet Order:  Diet regular Room service appropriate?: Yes; Fluid consistency:: Thin  Skin:  Reviewed, no issues  Last BM:  06/01/15  Height:   Ht Readings from Last 1 Encounters:  06/02/15  (1.854 m)    Weight:   Wt Readings from Last 1 Encounters:  06/03/15 142 lb (64.411 kg)    Ideal Body Weight:  75.3 kg  BMI:  Body mass index is 18.74 kg/(m^2).  Estimated Nutritional Needs:   Kcal:  1600-1800  Protein:  75-90 grams  Fluid:  1.6-1.8 L  EDUCATION NEEDS:   Education needs addressed  Jannelly Bergren A. Mayford Knife, RD, LDN, CDE Pager: [phone] After hours Pager: [phone]

## 2015-06-03 NOTE — Progress Notes (Signed)
Text paged Dr. Erlinda Hong Clung blood culture resulted Gram positive cocci clusters aerobic and anaerobic.

## 2015-06-03 NOTE — Progress Notes (Signed)
Patient ID: Samuel [name], male   DOB: Jun 16, 1961, 54 y.o.   MRN: [id number]     Subjective: Samuel [name] is doing well today without pain or fever.  The SP tube is draining well and the urine is clear yellow.   A renal US yesterday demonstrated resolution of the hydronephrosis.  ROS:  Review of Systems  Constitutional: Negative for fever and chills.  Gastrointestinal: Negative for nausea and abdominal pain.  All other systems reviewed and are negative.   Anti-infectives: Anti-infectives    Start     Dose/Rate Route Frequency Ordered Stop   06/03/15 0600  cefTRIAXone (ROCEPHIN) 1 g in dextrose 5 % 50 mL IVPB  Status:  Discontinued     1 g 100 mL/hr over 30 Minutes Intravenous Every 24 hours 06/02/15 1541 06/02/15 1813   06/02/15 2200  imipenem-cilastatin (PRIMAXIN) 500 mg in sodium chloride 0.9 % 100 mL IVPB     500 mg 200 mL/hr over 30 Minutes Intravenous 3 times per day 06/02/15 1832     06/02/15 2000  vancomycin (VANCOCIN) IVPB 750 mg/150 ml premix     750 mg 150 mL/hr over 60 Minutes Intravenous Every 8 hours 06/02/15 1834     06/02/15 0815  vancomycin (VANCOCIN) IVPB 1000 mg/200 mL premix     1,000 mg 200 mL/hr over 60 Minutes Intravenous  Once 06/02/15 0813 06/02/15 0939   06/02/15 0545  cefTRIAXone (ROCEPHIN) 1 g in dextrose 5 % 50 mL IVPB     1 g 100 mL/hr over 30 Minutes Intravenous  Once 06/02/15 0542 06/02/15 0646      Current Facility-Administered Medications  Medication Dose Route Frequency Provider Last Rate Last Dose  . 0.9 % NaCl with KCl 20 mEq/ L  infusion   Intravenous Continuous Lonia Blood, MD 100 mL/hr at 06/03/15 1037    . acetaminophen (TYLENOL) tablet 650 mg  650 mg Oral Q6H PRN Richarda Overlie, MD       Or  . acetaminophen (TYLENOL) suppository 650 mg  650 mg Rectal Q6H PRN Richarda Overlie, MD      . ALPRAZolam Prudy Feeler) tablet 1 mg  1 mg Oral QHS PRN Lonia Blood, MD      . baclofen (LIORESAL) tablet 40 mg  40 mg Oral TID Richarda Overlie, MD   40 mg at  06/03/15 1043  . clonazePAM (KLONOPIN) tablet 1 mg  1 mg Oral BID Lonia Blood, MD   1 mg at 06/03/15 1111  . dantrolene (DANTRIUM) capsule 100 mg  100 mg Oral TID Richarda Overlie, MD   100 mg at 06/03/15 1043  . darifenacin (ENABLEX) 24 hr tablet 7.5 mg  7.5 mg Oral Daily Richarda Overlie, MD   7.5 mg at 06/03/15 1043  . enoxaparin (LOVENOX) injection 40 mg  40 mg Subcutaneous Q24H Richarda Overlie, MD   40 mg at 06/02/15 1743  . feeding supplement (ENSURE ENLIVE) (ENSURE ENLIVE) liquid 237 mL  237 mL Oral BID BM Jenifer A Williams, RD   237 mL at 06/03/15 1045  . HYDROcodone-acetaminophen (NORCO/VICODIN) 5-325 MG per tablet 1-2 tablet  1-2 tablet Oral Q6H PRN Richarda Overlie, MD   2 tablet at 06/03/15 1115  . imipenem-cilastatin (PRIMAXIN) 500 mg in sodium chloride 0.9 % 100 mL IVPB  500 mg Intravenous 3 times per day Richarda Overlie, MD   500 mg at 06/03/15 0637  . levothyroxine (SYNTHROID, LEVOTHROID) tablet 125 mcg  125 mcg Oral QAC breakfast Richarda Overlie, MD  125 mcg at 06/03/15 0812  . mirabegron ER (MYRBETRIQ) tablet 50 mg  50 mg Oral Daily Nayana Abrol, MD   50 mg at 06/03/15 1100  . ondansetron (ZOFRAN) tablet 4 mg  4 mg Oral Q6H PRN Nayana Abrol, MD       Or  . ondansetron (ZOFRAN) injection 4 mg  4 mg Intravenous Q6H PRN Nayana Abrol, MD   4 mg at 06/03/15 0801  . pantoprazole (PROTONIX) EC tablet 40 mg  40 mg Oral Daily Nayana Abrol, MD   40 mg at 06/03/15 1043  . perphenazine (TRILAFON) tablet 2 mg  2 mg Oral BID Nayana Abrol, MD   2 mg at 06/03/15 1043  . phenazopyridine (PYRIDIUM) tablet 100 mg  100 mg Oral TID WC Nayana Abrol, MD   100 mg at 06/03/15 1044  . scopolamine (TRANSDERM-SCOP) 1 MG/3DAYS 1.5 mg  1 patch Transdermal Q72H Nayana Abrol, MD   1.5 mg at 06/02/15 1807  . sertraline (ZOLOFT) tablet 200 mg  200 mg Oral QHS Jeffrey T McClung, MD      . tiZANidine (ZANAFLEX) tablet 8 mg  8 mg Oral TID Nayana Abrol, MD   8 mg at 06/03/15 1043  . vancomycin (VANCOCIN) IVPB 750 mg/150 ml premix   750 mg Intravenous Q8H Nayana Abrol, MD   750 mg at 06/03/15 1111     Objective: Vital signs in last 24 hours: Temp:  [97.9 F (36.6 C)-98.6 F (37 C)] 98.5 F (36.9 C) (10/01 1146) Pulse Rate:  [77-102] 102 (10/01 0800) Resp:  [10-27] 27 (10/01 0800) BP: (104-153)/(62-88) 153/88 mmHg (10/01 0800) SpO2:  [92 %-99 %] 99 % (10/01 0800) Weight:  [61.2 kg (134 lb 14.7 oz)-64.411 kg (142 lb)] 64.411 kg (142 lb) (10/01 1043)  Intake/Output from previous day: 09/30 0701 - 10/01 0700 In: 2458.3 [I.V.:2208.3; IV Piggyback:250] Out: 4364 [Urine:4364] Intake/Output this shift: Total I/O In: 245 [P.O.:120; I.V.:125] Out: 550 [Urine:550]   Physical Exam  Constitutional: He is well-developed, well-nourished, and in no distress.  Vitals reviewed.   Lab Results:   Recent Labs  06/02/15 0925 06/03/15 0040  WBC 32.6* 21.7*  HGB 13.9 12.5*  HCT 41.2 37.9*  PLT 155 139*   BMET  Recent Labs  06/02/15 0500 06/02/15 0925 06/03/15 0040  NA 132*  --  137  K 3.3*  --  2.9*  CL 97*  --  106  CO2 22  --  25  GLUCOSE 149*  --  109*  BUN 15  --  6  CREATININE 0.81 0.70 0.39*  CALCIUM 9.3  --  8.5*   PT/INR No results for input(s): LABPROT, INR in the last 72 hours. ABG No results for input(s): PHART, HCO3 in the last 72 hours.  Invalid input(s): PCO2, PO2  Studies/Results: Ct Abdomen Pelvis W Contrast  06/02/2015   CLINICAL DATA:  Nausea and vomiting for 2 days. Fever. Previous appendectomy.  EXAM: CT ABDOMEN AND PELVIS WITH CONTRAST  TECHNIQUE: Multidetector CT imaging of the abdomen and pelvis was performed using the standard protocol following bolus administration of intravenous contrast.  CONTRAST:  <MEASUREMEKennitJoliGAr1B4er<MEASUREMENTKennitJoliGAr1B1er<MEASUREMENTKennitJoliGAr1B34er<MEASUREMENTKennitJoliGAr1B54er<MEASUREMENTKennitJoliGAr1B48er<MEASUREMENTKennitJoliGAr1B49er<MEASUREMENTKennitJoliGAr1B69er<MEASUREMENTKennitJoliGAr1B40er<MEASUREMENTKennitJoliGAr1B50er<MEASUREMENTKennitJoliGAr1B65er<MEASUREMENTKennitJoliGAr1B54er<MEASUREMENTKennitJoliGAr1B64e<MEASUREMENTKennitJoliGAr1B42er<MEASUREMENTKennitJoliGAr1B66er<MEASUREMENTKennitJoliGAr1B84er<MEASUREMENTKennitJoliGAr1B51er<MEASUREMENTKennitJoliGAr1B32e<MEASUREMENTKennitJoliGAr1B25er<MEASUREMENTKennitJoliGAr1B97er<MEASUREMENTKennitJoliGAr1B73er<MEASUREMENTKennitJoliGAr1B8er<MEASUREMENTKennitJoliGAr1B47e<MEASUREMENTKennitJoliGAr1B75er<MEASUREMENTKennitJoliGAr1B4er<MEASUREMENTKennitJoliGAr1B36e<MEASUREMENTKennitJoliGAr1B4er<MEASUREMENTKennitJoliGAr1B13erDorcas Carrowgario [email or web address] COMPARISON:  07/11/2014  FINDINGS: Lower chest:  No acute findings.  Hepatobiliary: No masses or other significant abnormality. Gallbladder is unremarkable.  Pancreas: No mass, inflammatory changes, or other significant abnormality.   Spleen: Within normal limits in size and appearance.  Adrenals/Urinary Tract: No masses identified. Mild bilateral hydronephrosis and ureterectasis is seen to the level of  the urinary bladder which is new since previous study. Mild perinephric soft tissue stranding and fluid is increased since previous study, although there is no radiographic evidence of focal pyelonephritis. No obstructing ureteral calculi visualized.  Several small calculi are noted in the urinary bladder and a small nonobstructive calculus is also seen in the upper pole the right kidney. Diffuse bladder wall thickening is again demonstrated with suprapubic catheter remaining in place.  Stomach/Bowel: No evidence of obstruction, inflammatory process, or abnormal fluid collections.  Vascular/Lymphatic: No pathologically enlarged lymph nodes. No evidence of abdominal aortic aneurysm.  Reproductive: No mass or other significant abnormality.  Other: None.  Musculoskeletal:  No suspicious bone lesions identified.  IMPRESSION: New mild bilateral hydroureteronephrosis to the level the urinary bladder, and bilateral perinephric stranding and fluid. No obstructing ureteral calculi identified. This may be related to vesicoureteral reflux or chronic bladder outlet obstruction.  Chronic diffuse bladder wall thickening with suprapubic catheter remaining in place.  Small nonobstructive calculi again noted in the urinary bladder and are pole right kidney.   Electronically Signed   By: Myles Rosenthal M.D.   On: 06/02/2015 07:31   US Renal  06/02/2015   CLINICAL DATA:  54 year old male with mild hydronephrosis identified on recent CT.  EXAM: RENAL / URINARY TRACT ULTRASOUND COMPLETE  COMPARISON:  06/02/2015 CT  FINDINGS: Right Kidney:  Length: 11.5 cm. Echogenicity within normal limits. No mass or hydronephrosis visualized.  Left Kidney:  Length: 10.5 cm. Echogenicity within normal limits. No mass or hydronephrosis visualized.  Bladder:  A catheter is identified  within a collapsed bladder.  IMPRESSION: No evidence of hydronephrosis identified on this study. Kidneys are unremarkable.  Catheter identified within the bladder.   Electronically Signed   By: Harmon Pier M.D.   On: 06/02/2015 23:39   Dg Chest Port 1 View  06/02/2015   CLINICAL DATA:  Sepsis. History of hypertension, pneumonia, multiple scleroses, spastic hemiplegia.  EXAM: PORTABLE CHEST 1 VIEW  COMPARISON:  Chest radiograph July 10, 2014  FINDINGS: Patient rotated to the RIGHT.Cardiomediastinal silhouette is unremarkable for this low inspiratory portable examination with crowded vasculature markings. The lungs are clear without pleural effusions or focal consolidations. Trachea projects midline and there is no pneumothorax. Nipple shadow LEFT lung base. Included soft tissue planes and osseous structures are non-suspicious. Similar osteopenia.  IMPRESSION: No acute cardiopulmonary process for this low inspiratory portable examination.   Electronically Signed   By: Awilda Metro M.D.   On: 06/02/2015 05:46   Labs and renal US report reviewed.   Assessment and Plan: He is improving clinically with the new tube and antbiotic therapy. The hydronephrosis has resolved.  After discharge he will need to have daily bladder irrigations with 0.25% acetic acid solution to reduce encrustation.   I have placed the order.  He should f/u with Dr. [name] in 1-2 months.   Please reconsult as needed.       LOS: 1 day    Law Corsino J 06/03/2015 [phone]

## 2015-06-04 ENCOUNTER — Inpatient Hospital Stay (HOSPITAL_COMMUNITY): Payer: Medicare Other

## 2015-06-04 LAB — COMPREHENSIVE METABOLIC PANEL
ALT: 11 U/L — ABNORMAL LOW (ref 17–63)
AST: 12 U/L — ABNORMAL LOW (ref 15–41)
Albumin: 2.6 g/dL — ABNORMAL LOW (ref 3.5–5.0)
Alkaline Phosphatase: 46 U/L (ref 38–126)
Anion gap: 6 (ref 5–15)
BUN: 7 mg/dL (ref 6–20)
CO2: 24 mmol/L (ref 22–32)
Calcium: 8.1 mg/dL — ABNORMAL LOW (ref 8.9–10.3)
Chloride: 103 mmol/L (ref 101–111)
Creatinine, Ser: 0.4 mg/dL — ABNORMAL LOW (ref 0.61–1.24)
GFR calc Af Amer: >60 mL/min (ref >60)
GFR calc non Af Amer: >60 mL/min (ref >60)
Glucose, Bld: 91 mg/dL (ref 65–99)
Potassium: 3.8 mmol/L (ref 3.5–5.1)
Sodium: 133 mmol/L — ABNORMAL LOW (ref 135–145)
Total Bilirubin: 0.7 mg/dL (ref 0.3–1.2)
Total Protein: 4.5 g/dL — ABNORMAL LOW (ref 6.5–8.1)

## 2015-06-04 LAB — CBC
HCT: 32.2 % — ABNORMAL LOW (ref 39.0–52.0)
Hemoglobin: 10.5 g/dL — ABNORMAL LOW (ref 13.0–17.0)
MCH: 29.7 pg (ref 26.0–34.0)
MCHC: 32.6 g/dL (ref 30.0–36.0)
MCV: 91 fL (ref 78.0–100.0)
Platelets: 109 10*3/uL — ABNORMAL LOW (ref 150–400)
RBC: 3.54 MIL/uL — ABNORMAL LOW (ref 4.22–5.81)
RDW: 15.2 % (ref 11.5–15.5)
WBC: 9.8 10*3/uL (ref 4.0–10.5)

## 2015-06-04 LAB — LACTIC ACID, PLASMA: Lactic Acid, Venous: 1.4 mmol/L (ref 0.5–2.0)

## 2015-06-04 LAB — CULTURE, BLOOD (ROUTINE X 2)

## 2015-06-04 LAB — TROPONIN I: Troponin I: 0.09 ng/mL — ABNORMAL HIGH (ref <0.031)

## 2015-06-04 LAB — URINE CULTURE: Culture: >=100000

## 2015-06-04 LAB — VANCOMYCIN, TROUGH: Vancomycin Tr: 21 ug/mL — ABNORMAL HIGH (ref 10.0–20.0)

## 2015-06-04 MED ORDER — SODIUM CHLORIDE 0.9 % IV SOLN
500.0000 mg | Freq: Three times a day (TID) | INTRAVENOUS | Status: DC
  Administered 2015-06-05 (×2): 500 mg via INTRAVENOUS
  Filled 2015-06-04 (×4): qty 500

## 2015-06-04 MED ORDER — CEFAZOLIN SODIUM 1-5 GM-% IV SOLN
1.0000 g | Freq: Three times a day (TID) | INTRAVENOUS | Status: DC
  Administered 2015-06-04 – 2015-06-05 (×4): 1 g via INTRAVENOUS
  Filled 2015-06-04 (×5): qty 50

## 2015-06-04 NOTE — Progress Notes (Signed)
UR COMPLETED  

## 2015-06-04 NOTE — Progress Notes (Signed)
[facility] TEAM 1 - Stepdown/ICU TEAM PROGRESS NOTE  Samuel [name] EAV:[id number] DOB: Jan 13, 1961 DOA: 06/02/2015 PCP: [name], MD  Admit HPI / Brief Narrative: 54 year old male with a history of multiple sclerosis, spastic quadriparesis, neurogenic bladder with chronic suprapubic Foley catheter who presented with altered mental status and fever. Family called EMS because of leaking urine from the suprapubic catheter and confusion that started 2 days prior. Per EMS the patient had a temp of 100.8 and was tachycardic at 140. Patient complained of lower abdominal discomfort. In the ER the patient's suprapubic catheter appeared to be blocked with leakage of urine for around catheter site.. Suprapubic catheter was replaced by ER physician which improved the patient's suprapubic pain and discomfort. White blood cell count was found to be 51,000.   He has had a suprapubic catheter since November 2015.  He has severe bladder muscle spasm, leading to urinary incontinence and therefore continues using condom catheter.   HPI/Subjective: The pt reports some nausea and chills this morning, but no vomiting.  He has also felt intermittent pressure on his bladder as if it is not emptying.  He denies cp, sob, or abdom pain.    Assessment/Plan:  Severe Sepsis due to Gram- rod UTI w/ Gram+ cocci bacteremia  Cont imipenem & vancomycin - f/u speciations - clinically much improved - lactate normalized - suspect blood cx result is a contaminant but cont vanc until confirmed   Lactic acidosis Secondary to sepsis & lack of renal clearance from obstructed suprapubic Foley - normalized   Acute renal failure w/ hydronephrosis Secondary to suprapubic catheter obstruction - Urology has seen and cath changed - hydro is felt to be mild - for repeat eval w/ [place] tmrw morning - UOP 1L last 24hrs  Altered mental status Resolved - now at baseline MS   Elevated troponin I Likely due to subendocardial ischemia in the  setting of sinus tachycardia with lactic acidosis - troponin approaching normal - no chest pain   Hypomagnesemia Replaced    Hypokalemia  Replaced  Multiple sclerosis Home medication regimen to continue - is non-ambulatory   Malnutrition of moderate degree  Code Status: FULL Family Communication: no family present at time of exam Disposition Plan: SDU - await speciation of cultures to guide abx tx - f/u renal US to confirm resolution of hydro - possible d/c home 10/3  Consultants: PCCM Urology   Procedures: TTE - 10/1 - EF 60-65% - no WMA   Antibiotics: Imipenem 9/30 > Vanc 9/30 > Rocephin 9/29  DVT prophylaxis: lovenox   Objective: Blood pressure 156/104, pulse 83, temperature 98.8 F (37.1 C), temperature source Oral, resp. rate 19, height  (1.854 m), weight 64.411 kg (142 lb), SpO2 99 %.  Intake/Output Summary (Last 24 hours) at 06/04/15 0934 Last data filed at 06/04/15 0800  Gross per 24 hour  Intake   2605 ml  Output    850 ml  Net   1755 ml   Exam: General: No acute respiratory distress - alert and conversant  Lungs: Clear to auscultation bilaterally without wheezes / crackles Cardiovascular: Regular rate and rhythm without murmur gallop or rub Abdomen: mildly tender B low quads, suprapubic urinary cath insertion clean/dry , nondistended, soft, bowel sounds positive, no rebound, no ascites, no appreciable mass Extremities: No significant cyanosis, clubbing, edema bilateral lower extremities  Data Reviewed: Basic Metabolic Panel:  Recent Labs Lab 06/02/15 0500 06/02/15 0925 06/03/15 0040 06/04/15 0250  NA 132*  --  137 133*  K 3.3*  --  2.9* 3.8  CL 97*  --  106 103  CO2 22  --  25 24  GLUCOSE 149*  --  109* 91  BUN 15  --  6 7  CREATININE 0.81 0.70 0.39* 0.40*  CALCIUM 9.3  --  8.5* 8.1*  MG  --  1.6* 2.7*  --     CBC:  Recent Labs Lab 06/02/15 0500 06/02/15 0925 06/03/15 0040 06/04/15 0250  WBC 51.1* 32.6* 21.7* 9.8  NEUTROABS  46.5*  --   --   --   HGB 17.5* 13.9 12.5* 10.5*  HCT 50.4 41.2 37.9* 32.2*  MCV 87.5 88.6 87.7 91.0  PLT 256 155 139* 109*    Liver Function Tests:  Recent Labs Lab 06/02/15 0500 06/03/15 0040 06/04/15 0250  AST 32 17 12*  ALT 19 12* 11*  ALKPHOS 80 53 46  BILITOT 1.3* 0.6 0.7  PROT 6.0* 4.7* 4.5*  ALBUMIN 3.9 2.7* 2.6*   Cardiac Enzymes:  Recent Labs Lab 06/02/15 0925 06/02/15 1555 06/02/15 2033 06/04/15 0250  TROPONINI 0.38* 0.24* 0.20* 0.09*    Recent Results (from the past 240 hour(s))  Urine culture     Status: None (Preliminary result)   Collection Time: 06/02/15  4:59 AM  Result Value Ref Range Status   Specimen Description URINE, RANDOM  Final   Special Requests NONE  Final   Culture >=100,000 COLONIES/mL GRAM NEGATIVE RODS  Final   Report Status PENDING  Incomplete  Blood Culture (routine x 2)     Status: None (Preliminary result)   Collection Time: 06/02/15  5:00 AM  Result Value Ref Range Status   Specimen Description BLOOD WRIST LEFT  Final   Special Requests BOTTLES DRAWN AEROBIC AND ANAEROBIC 5CC  Final   Culture  Setup Time   Final    GRAM POSITIVE COCCI IN CLUSTERS IN BOTH AEROBIC AND ANAEROBIC BOTTLES CRITICAL RESULT CALLED TO, READ BACK BY AND VERIFIED WITH: WILSON@0714  06/03/15 MKELLY    Culture TOO YOUNG TO READ  Final   Report Status PENDING  Incomplete  Blood Culture (routine x 2)     Status: None (Preliminary result)   Collection Time: 06/02/15  5:00 AM  Result Value Ref Range Status   Specimen Description BLOOD ARM RIGHT  Final   Special Requests BOTTLES DRAWN AEROBIC AND ANAEROBIC 10CC  Final   Culture  Setup Time   Final    GRAM NEGATIVE RODS IN BOTH AEROBIC AND ANAEROBIC BOTTLES CRITICAL RESULT CALLED TO, READ BACK BY AND VERIFIED WITH: A WILSON RN 1930 06/02/15 A BROWNING    Culture GRAM NEGATIVE RODS  Final   Report Status PENDING  Incomplete     Studies:   Recent x-ray studies have been reviewed in detail by the  Attending Physician  Scheduled Meds:  Scheduled Meds: . baclofen  40 mg Oral TID  . clonazePAM  1 mg Oral BID  . dantrolene  100 mg Oral TID  . darifenacin  7.5 mg Oral Daily  . enoxaparin (LOVENOX) injection  40 mg Subcutaneous Q24H  . feeding supplement (ENSURE ENLIVE)  237 mL Oral BID BM  . imipenem-cilastatin  500 mg Intravenous 3 times per day  . levothyroxine  125 mcg Oral QAC breakfast  . mirabegron ER  50 mg Oral Daily  . pantoprazole  40 mg Oral Daily  . perphenazine  2 mg Oral BID  . phenazopyridine  100 mg Oral TID WC  . scopolamine  1  patch Transdermal Q72H  . sertraline  200 mg Oral QHS  . tiZANidine  8 mg Oral TID  . vancomycin  750 mg Intravenous Q8H    Time spent on care of this patient: 35 mins   MCCLUNG,JEFFREY T , MD   [facility] Office  [phone] Pager - Text Page per Loretha Stapler as per below:  On-Call/Text Page:      [email or web address]      password [email or web address]  If 7PM-7AM, please contact night-coverage [email or web address] Password [email or web address] 06/04/2015, 9:34 AM   LOS: 2 days

## 2015-06-04 NOTE — Progress Notes (Signed)
ANTIBIOTIC CONSULT NOT  Pharmacy Consult for vancomycin  Indication: rule out sepsis  Allergies  Allergen Reactions  . Penicillins Other (See Comments)    Unknown childhoood reaction (takes amoxicillian with reaction) Has tolerated ceftriaxone, cefepime, and Primaxin during inpatient admissions    Patient Measurements: Height: 6\' 1"  (185.4 cm) Weight: 142 lb (64.411 kg) (verified on bedscale) IBW/kg (Calculated) : 79.9   Vital Signs: Temp: 98.8 F (37.1 C) (10/02 2005) Temp Source: Oral (10/02 2005) BP: 127/79 mmHg (10/02 2005) Pulse Rate: 82 (10/02 2005) Intake/Output from previous day: 10/01 0701 - 10/02 0700 In: 1726.7 [P.O.:180; I.V.:1296.7; IV Piggyback:250] Out: 1050 [Urine:1050] Intake/Output from this shift: Total I/O In: -  Out: 650 [Urine:650]  Labs:  Recent Labs  06/02/15 0925 06/03/15 0040 06/04/15 0250  WBC 32.6* 21.7* 9.8  HGB 13.9 12.5* 10.5*  PLT 155 139* 109*  CREATININE 0.70 0.39* 0.40*   Estimated Creatinine Clearance: 96.2 mL/min (by C-G formula based on Cr of 0.4).  Recent Labs  06/04/15 1910  VANCOTROUGH 21*     Microbiology: Recent Results (from the past 720 hour(s))  Urine culture     Status: None   Collection Time: 06/02/15  4:59 AM  Result Value Ref Range Status   Specimen Description URINE, RANDOM  Final   Special Requests NONE  Final   Culture >=100,000 COLONIES/mL CITROBACTER KOSERI  Final   Report Status 06/04/2015 FINAL  Final   Organism ID, Bacteria CITROBACTER KOSERI  Final      Susceptibility   Citrobacter koseri - MIC*    CEFAZOLIN <=4 SENSITIVE Sensitive     CEFTRIAXONE <=1 SENSITIVE Sensitive     CIPROFLOXACIN <=0.25 SENSITIVE Sensitive     GENTAMICIN <=1 SENSITIVE Sensitive     IMIPENEM <=0.25 SENSITIVE Sensitive     NITROFURANTOIN <=16 SENSITIVE Sensitive     TRIMETH/SULFA <=20 SENSITIVE Sensitive     PIP/TAZO <=4 SENSITIVE Sensitive     * >=100,000 COLONIES/mL CITROBACTER KOSERI  Blood Culture (routine x  2)     Status: None (Preliminary result)   Collection Time: 06/02/15  5:00 AM  Result Value Ref Range Status   Specimen Description BLOOD WRIST LEFT  Final   Special Requests BOTTLES DRAWN AEROBIC AND ANAEROBIC 5CC  Final   Culture  Setup Time   Final    GRAM POSITIVE COCCI IN CLUSTERS IN BOTH AEROBIC AND ANAEROBIC BOTTLES CRITICAL RESULT CALLED TO, READ BACK BY AND VERIFIED WITH: WILSON@0714  06/03/15 MKELLY    Culture GRAM POSITIVE COCCI  Final   Report Status PENDING  Incomplete  Blood Culture (routine x 2)     Status: None   Collection Time: 06/02/15  5:00 AM  Result Value Ref Range Status   Specimen Description BLOOD ARM RIGHT  Final   Special Requests BOTTLES DRAWN AEROBIC AND ANAEROBIC 10CC  Final   Culture  Setup Time   Final    GRAM NEGATIVE RODS IN BOTH AEROBIC AND ANAEROBIC BOTTLES CRITICAL RESULT CALLED TO, READ BACK BY AND VERIFIED WITH: A WILSON RN 1930 06/02/15 A BROWNING    Culture CITROBACTER KOSERI  Final   Report Status 06/04/2015 FINAL  Final   Organism ID, Bacteria CITROBACTER KOSERI  Final      Susceptibility   Citrobacter koseri - MIC*    CEFAZOLIN <=4 SENSITIVE Sensitive     CEFEPIME <=1 SENSITIVE Sensitive     CEFTAZIDIME <=1 SENSITIVE Sensitive     CEFTRIAXONE <=1 SENSITIVE Sensitive     CIPROFLOXACIN <=0.25 SENSITIVE Sensitive  GENTAMICIN <=1 SENSITIVE Sensitive     IMIPENEM <=0.25 SENSITIVE Sensitive     TRIMETH/SULFA <=20 SENSITIVE Sensitive     PIP/TAZO <=4 SENSITIVE Sensitive     * CITROBACTER KOSERI   Assessment: 54yom Hx MS, spastic quadriparesis, neurogenic bladder with chronic suprapubic foley catheter who presents with altered mental status, fever at home.  WBC elevated 51> 33 >27>9.8. LA now normal, afebrile here but fever PTA.  SCr 0.81 on admit, now down to 0.4 which is more likely patient's baseline as quadriplegic. A vancomycin trough drawn tonight was slightly elevated at 78mcg/mL. Dose was given after that was drawn.  Vanc 9/30  >> Cefazolin 10/2 >> Imipenem 9/30>> 10/2 Ceftriaxone x1 9/30  9/30 BCx x2: 1/2 GNR (pan sensitive citrobacter), 1/2 GPC in clusters (each in separate cultures) 9/20 UCx: >100K GNRs  Goal of Therapy:  Vancomycin trough level 15-20 mcg/ml  Plan:  -reduce Vancomycin to  q8hr  -cefazolin 1g IV q8h -follow c/s, clinical progression, renal function, trough at new SS  Gwendelyn Lanting D. Eber Ferrufino, PharmD, BCPS Clinical Pharmacist Pager: [phone] 06/04/2015 9:47 PM

## 2015-06-05 LAB — CULTURE, BLOOD (ROUTINE X 2)

## 2015-06-05 LAB — CBC
HCT: 36.4 % — ABNORMAL LOW (ref 39.0–52.0)
Hemoglobin: 11.7 g/dL — ABNORMAL LOW (ref 13.0–17.0)
MCH: 29.3 pg (ref 26.0–34.0)
MCHC: 32.1 g/dL (ref 30.0–36.0)
MCV: 91 fL (ref 78.0–100.0)
Platelets: 107 10*3/uL — ABNORMAL LOW (ref 150–400)
RBC: 4 MIL/uL — ABNORMAL LOW (ref 4.22–5.81)
RDW: 14.7 % (ref 11.5–15.5)
WBC: 8 10*3/uL (ref 4.0–10.5)

## 2015-06-05 LAB — COMPREHENSIVE METABOLIC PANEL
ALT: 9 U/L — ABNORMAL LOW (ref 17–63)
AST: 13 U/L — ABNORMAL LOW (ref 15–41)
Albumin: 2.6 g/dL — ABNORMAL LOW (ref 3.5–5.0)
Alkaline Phosphatase: 47 U/L (ref 38–126)
Anion gap: 7 (ref 5–15)
BUN: 5 mg/dL — ABNORMAL LOW (ref 6–20)
CO2: 26 mmol/L (ref 22–32)
Calcium: 8.5 mg/dL — ABNORMAL LOW (ref 8.9–10.3)
Chloride: 106 mmol/L (ref 101–111)
Creatinine, Ser: 0.42 mg/dL — ABNORMAL LOW (ref 0.61–1.24)
GFR calc Af Amer: >60 mL/min (ref >60)
GFR calc non Af Amer: >60 mL/min (ref >60)
Glucose, Bld: 93 mg/dL (ref 65–99)
Potassium: 4.1 mmol/L (ref 3.5–5.1)
Sodium: 139 mmol/L (ref 135–145)
Total Bilirubin: 0.8 mg/dL (ref 0.3–1.2)
Total Protein: 4.7 g/dL — ABNORMAL LOW (ref 6.5–8.1)

## 2015-06-05 LAB — MRSA PCR SCREENING: MRSA by PCR: NEGATIVE

## 2015-06-05 MED ORDER — CEPHALEXIN 500 MG PO CAPS: 500.0000 mg | ORAL_CAPSULE | Freq: Two times a day (BID) | ORAL | Status: DC

## 2015-06-05 MED ORDER — CEPHALEXIN 500 MG PO CAPS
500.0000 mg | ORAL_CAPSULE | Freq: Two times a day (BID) | ORAL | Status: DC
  Filled 2015-06-05: qty 1

## 2015-06-05 MED ORDER — CEFAZOLIN SODIUM-DEXTROSE 2-3 GM-% IV SOLR
2.0000 g | Freq: Three times a day (TID) | INTRAVENOUS | Status: DC
  Filled 2015-06-05 (×2): qty 50

## 2015-06-05 NOTE — Progress Notes (Signed)
Pt's mother notified pt will be discharged to home. Mother requested to have transportation arranged and states she will have family at home for his arrival.

## 2015-06-05 NOTE — Discharge Instructions (Signed)
Suprapubic Catheter Home Guide  A suprapubic catheter is a rubber tube with a tiny balloon on the end. It is used to drain urine from the bladder. This catheter is put in your bladder through a small opening in the lower center part of your abdomen. Suprapubic refers to the area right above your pubic bone. The balloon on the end of the catheter is filled with germ-free (sterile) water. This keeps the catheter from slipping out. When the catheter is in place, your urine will drain into a collection bag. The bag can be put beside your bed at night or attached to your leg during the day. HOW TO CARE FOR YOUR CATHETER  Cleaning your skin  Clean the skin around the catheter opening every day.  Wash your hands with soap and water.  Clean the skin around the opening with a clean washcloth and soapy water. Do not pull on the tube.  Pat the area dry with a clean towel.  Your caregiver may want you to put a bandage (dressing) over the site. Do not use ointment on this area unless your caregiver tells you to. Cleaning the catheter  Ask your caregiver if you need to clean the catheter and how often.  Use only soap and water.  There may be crusts on the catheter. Put hydrogen peroxide on a cotton ball or gauze pad to remove any crust. Emptying the collection bag  You may have a large drainage bag to use at night and a smaller one for daytime. Empty the large bag every 8 hours. Empty the small bag when it is about  full.  Keep the drainage bag below the level of the catheter. This keeps urine from flowing backwards.  Hold the bag over the toilet or another container. Release the valve (spigot) at the bottom of the bag. Do not touch the opening of the spigot. Do not let the opening touch the toilet or container.  Close the spigot tightly when the bag is empty. Cleaning the collection bag  Clean the bag every few days.  First, wash your hands.  Disconnect the tubing from the catheter.  Replace the used bag with a new bag. Then you can clean the used one.  Empty the used bag completely. Rinse it out with warm water and soap or fill the bag with water and add 1 teaspoon of vinegar. Let it sit for about 30 minutes. Then drain.  The bag should be completely dry before storing it. Put it inside a plastic bag to keep it clean. Checking everything  Always make sure there are no kinks in the catheter or tubing.  Always make sure there are no leaks in the catheter, tubing, or collection bag. HOW TO CHANGE YOUR CATHETER Sometimes, a caregiver will change your suprapubic catheter. Other times, you may need to change it yourself. This may be the case if you need to wear a catheter for a long time. Usually, they need to be changed every 4 to 6 weeks. Ask your caregiver how often yours should be changed. Your caregiver will help you order the following supplies for home delivery:  Sterile gloves.  Catheters.  Syringes.  Sterile water.  Sterile cleaning solution.  Lubricant.  Drainage bags. Changing your catheter  Drink plenty of fluids before changing the catheter.  Wash your hands with soap and water.  Lie on your back and put on sterile gloves.  Clean the skin around the catheter opening. Use the sterile cleaning solution.  Use a syringe to get the water out of the balloon from the old catheter.  Slowly remove the catheter.  Take off the first pair of gloves, and put on a new pair. Then put lubricant on the tip of the new catheter. Put the new catheter through the opening.  Wait for some urine to start flowing. Then, use the other syringe to fill the balloon with sterile water.  Attach the catheter to your drainage bag. Make sure the connection is tight. Important warnings  The catheter should come out easily. If it seems stuck, do not pull it.  Call your caregiver right away if you have any trouble while changing the catheter.  When the old catheter is  removed, the new one should be put in right away. This is because the opening will close quickly. If you have a problem, go to an emergency clinic right away. RISKS AND COMPLICATIONS  Urine flow can become blocked. This can happen if the catheter or tubes are not working right. A blood clot can also block urine flow.  The catheter might irritate tissue in your body. This can cause bleeding.  The skin near the opening for the catheter may become irritated or infected.  Bacteria may get into your bladder. This can cause a urinary tract infection. HOME CARE INSTRUCTIONS  Take all medicines prescribed by your caregiver. Follow the directions carefully.  Drink 8 glasses of water every day. This produces good urine flow.  Check the skin around your catheter a few times every day. Watch for redness and swelling. Look for any fluids coming out of the opening.  Do not use powder or cream around the catheter opening.  Do not take tub baths or use pools or hot tubs.  Keep all follow-up appointments. SEEK MEDICAL CARE IF:  You leak urine.  Your skin around the catheter becomes red or sore.  Your urine flow slows down.  Your urine gets cloudy or smelly. SEEK IMMEDIATE MEDICAL CARE IF:   You have chills, nausea, or back pain.  You have trouble changing your catheter.  Your catheter comes out.  You have blood in your urine.  You have no urine flow for 1 hour.  You have a fever. Document Released: 05/07/2011 Document Revised: 11/11/2011 Document Reviewed: 05/07/2011 [facility] Patient Information 2015 [place], [place]. This information is not intended to replace advice given to you by your health care provider. Make sure you discuss any questions you have with your health care provider.     Pyelonephritis, Adult  Pyelonephritis is a kidney infection. In general, there are 2 main types of pyelonephritis:  Infections that come on quickly without any warning (acute  pyelonephritis).  Infections that persist for a long period of time (chronic pyelonephritis). CAUSES  Two main causes of pyelonephritis are:  Bacteria traveling from the bladder to the kidney. This is a problem especially in pregnant women. The urine in the bladder can become filled with bacteria from multiple causes, including:  Inflammation of the prostate gland (prostatitis).  Sexual intercourse in females.  Bladder infection (cystitis).  Bacteria traveling from the bloodstream to the tissue part of the kidney. Problems that may increase your risk of getting a kidney infection include:  Diabetes.  Kidney stones or bladder stones.  Cancer.  Catheters placed in the bladder.  Other abnormalities of the kidney or ureter. SYMPTOMS   Abdominal pain.  Pain in the side or flank area.  Fever.  Chills.  Upset stomach.  Blood in  the urine (dark urine).  Frequent urination.  Strong or persistent urge to urinate.  Burning or stinging when urinating. DIAGNOSIS  Your caregiver may diagnose your kidney infection based on your symptoms. A urine sample may also be taken. TREATMENT  In general, treatment depends on how severe the infection is.   If the infection is mild and caught early, your caregiver may treat you with oral antibiotics and send you home.  If the infection is more severe, the bacteria may have gotten into the bloodstream. This will require intravenous (IV) antibiotics and a hospital stay. Symptoms may include:  High fever.  Severe flank pain.  Shaking chills.  Even after a hospital stay, your caregiver may require you to be on oral antibiotics for a period of time.  Other treatments may be required depending upon the cause of the infection. HOME CARE INSTRUCTIONS   Take your antibiotics as directed. Finish them even if you start to feel better.  Make an appointment to have your urine checked to make sure the infection is gone.  Drink enough  fluids to keep your urine clear or pale yellow.  Take medicines for the bladder if you have urgency and frequency of urination as directed by your caregiver. SEEK IMMEDIATE MEDICAL CARE IF:   You have a fever or persistent symptoms for more than 2-3 days.  You have a fever and your symptoms suddenly get worse.  You are unable to take your antibiotics or fluids.  You develop shaking chills.  You experience extreme weakness or fainting.  There is no improvement after 2 days of treatment. MAKE SURE YOU:  Understand these instructions.  Will watch your condition.  Will get help right away if you are not doing well or get worse. Document Released: 08/19/2005 Document Revised: 02/18/2012 Document Reviewed: 01/23/2011 [facility] Patient Information 2015 [place], [place]. This information is not intended to replace advice given to you by your health care provider. Make sure you discuss any questions you have with your health care provider.

## 2015-06-05 NOTE — Discharge Summary (Signed)
DISCHARGE SUMMARY  Samuel [name]  MR#: [id number]  DOB:[birth year]  Date of Admission: 06/02/2015 Date of Discharge: 06/05/2015  Attending Physician:MCCLUNG,JEFFREY T  Patient's WUJ:WJXBJYN,WGNFA A, MD  Consults:  Urology   Disposition: D/C home   Follow-up Appts:     Follow-up Information    Follow up with [facility], [facility].   Specialty:  Urology   Why:  Please contact the office to arrange f/u with Dr. [name] in 1-2 months.    Contact information:   [address] [place] [address] [phone]       Follow up with BURNETT,BRENT A, MD In 1 week.   Specialty:  Family Medicine   Contact information:   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 Family called EMS because of leaking urine from the suprapubic catheter and confusion that started 2 days prior. Per EMS the patient had a temp of 100.8 and was tachycardic at 140. Patient complained of lower abdominal discomfort. In the ER the patient's suprapubic catheter appeared to be blocked with leakage of urine for around catheter site.. Suprapubic catheter was replaced by ER physician which improved the patient's suprapubic pain and discomfort. White blood cell count was found to be 51,000.   He has had a suprapubic catheter since November 2015. He has severe bladder muscle spasm, leading to urinary incontinence and therefore continues using condom catheter.   Hospital Course:  Severe Sepsis due to  Cirtobacer UTI w/ bacteremia  imipenem & vancomycin utilized initially - pt then transitioned to Ancef alone, w/o difficulty and w/ continued clinical improvement - lactate normalized - to complete a full 14 day course of abx given + blood cx  Coag neg staph in 1/2 blood cx Suspect this represents a contaminant - vanc stopped   Lactic acidosis Secondary to sepsis & lack of renal clearance from obstructed suprapubic Foley - normalized   Acute renal failure w/ hydronephrosis Secondary to suprapubic catheter obstruction - Urology saw and cath changed - hydro was felt to be mild - repeat eval w/ [place] confirmed resolution - UOP steady th/o hospital f/u - to f/u w/ Urology in time they specified   Altered mental status Resolved - at baseline MS at time of d/c   Elevated troponin I Likely due to subendocardial ischemia in the setting of sinus tachycardia with lactic acidosis - troponin peaked at 0.2 - no chest pain   Hypomagnesemia Replaced   Hypokalemia  Replaced  Multiple sclerosis Home medication regimen to continue - is non-ambulatory   Malnutrition of moderate degree    Medication List    STOP taking these medications        amitriptyline 100 MG tablet  Commonly known as:  ELAVIL     lisinopril-hydrochlorothiazide 10-12.5 MG tablet  Commonly known as:  PRINZIDE,ZESTORETIC     predniSONE 10 MG tablet  Commonly known as:  DELTASONE      TAKE these medications        acetic acid 0.25 % irrigation  Irrigate with as directed daily. Instill 30ml and  clamp tube for 30 minutes then drain.     ALPRAZolam 1 MG tablet  Commonly known as:  XANAX  TAKE ONE TABLET BY MOUTH AT BEDTIME AS NEEDED FOR ANXIETY     AZO CRANBERRY PO  Take 1 tablet by mouth 2 (two) times daily.     AZO TABS PO  Take by mouth 2 (two) times daily.     baclofen 20 MG tablet  Commonly known as:  LIORESAL  take 2 tablets by mouth three times a day     BOTOX IJ  Inject 300 Units as directed every 3  (three) months. Administered at MD office     cephALEXin 500 MG capsule  Commonly known as:  KEFLEX  Take 1 capsule (500 mg total) by mouth every 12 (twelve) hours.     clonazePAM 1 MG tablet  Commonly known as:  KLONOPIN  TAKE ONE TABLET BY MOUTH TWICE DAILY     dantrolene 100 MG capsule  Commonly known as:  DANTRIUM  TAKE ONE CAPSULE BY MOUTH THREE TIMES DAILY     diazepam 10 MG tablet  Commonly known as:  VALIUM  Take 10 mg by mouth at bedtime as needed for anxiety.     HYDROcodone-acetaminophen 5-325 MG tablet  Commonly known as:  NORCO/VICODIN  Take 1-2 tablets by mouth every 6 (six) hours as needed.     ibuprofen 200 MG tablet  Commonly known as:  ADVIL,MOTRIN  Take 600 mg by mouth as needed for fever or moderate pain.     levothyroxine 125 MCG tablet  Commonly known as:  SYNTHROID, LEVOTHROID  Take 125 mcg by mouth daily.     MYRBETRIQ 50 MG Tb24 tablet  Generic drug:  mirabegron ER  Take 50 mg by mouth daily.     omeprazole 20 MG capsule  Commonly known as:  PRILOSEC  Take 20 mg by mouth every morning.     perphenazine 2 MG tablet  Commonly known as:  TRILAFON  TAKE ONE TABLET BY MOUTH TWICE DAILY     scopolamine 1 MG/3DAYS  Commonly known as:  TRANSDERM-SCOP (1.5 MG)  Place 1 patch (1.5 mg total) onto the skin every 3 (three) days.     sertraline 100 MG tablet  Commonly known as:  ZOLOFT  Take 200 mg by mouth at bedtime.     solifenacin 10 MG tablet  Commonly known as:  VESICARE  Take 10 mg by mouth daily.     tiZANidine 4 MG capsule  Commonly known as:  ZANAFLEX  Take 8 mg by mouth 3 (three) times daily.     traMADol 50 MG tablet  Commonly known as:  ULTRAM  Take 1 tablet (50 mg total) by mouth every 6 (six) hours as needed.     Vitamin D (Ergocalciferol) 50000 UNITS Caps capsule  Commonly known as:  DRISDOL  Take 50,000 Units by mouth every 7 (seven) days. Friday only        Day of Discharge BP 105/78 mmHg  Pulse 66  Temp(Src) 98.2 F  (36.8 C) (Oral)  Resp 15  Ht  (1.854 m)  Wt 64.411 kg (142 lb)  BMI 18.74 kg/m2  SpO2 96%  Physical Exam: General: No acute respiratory distress Lungs: Clear to auscultation bilaterally without wheezes or crackles Cardiovascular: Regular rate and rhythm without murmur gallop or rub normal S1 and S2 Abdomen: Nontender, nondistended, soft, bowel sounds positive, no rebound, no ascites, no appreciable mass Extremities: No significant cyanosis, clubbing, or  edema bilateral lower extremities  Basic Metabolic Panel:  Recent Labs Lab 06/02/15 0500 06/02/15 0925 06/03/15 0040 06/04/15 0250 06/05/15 0355  NA 132*  --  137 133* 139  K 3.3*  --  2.9* 3.8 4.1  CL 97*  --  106 103 106  CO2 22  --  GLUCOSE 149*  --  109* 91 93  BUN 15  --  6 7 5*  CREATININE 0.81 0.70 0.39* 0.40* 0.42*  CALCIUM 9.3  --  8.5* 8.1* 8.5*  MG  --  1.6* 2.7*  --   --     Liver Function Tests:  Recent Labs Lab 06/02/15 0500 06/03/15 0040 06/04/15 0250 06/05/15 0355  AST 32 17 12* 13*  ALT 19 12* 11* 9*  ALKPHOS 80 53 46 47  BILITOT 1.3* 0.6 0.7 0.8  PROT 6.0* 4.7* 4.5* 4.7*  ALBUMIN 3.9 2.7* 2.6* 2.6*    CBC:  Recent Labs Lab 06/02/15 0500 06/02/15 0925 06/03/15 0040 06/04/15 0250 06/05/15 0355  WBC 51.1* 32.6* 21.7* 9.8 8.0  NEUTROABS 46.5*  --   --   --   --   HGB 17.5* 13.9 12.5* 10.5* 11.7*  HCT 50.4 41.2 37.9* 32.2* 36.4*  MCV 87.5 88.6 87.7 91.0 91.0  PLT 256 155 139* 109* 107*    Cardiac Enzymes:  Recent Labs Lab 06/02/15 0925 06/02/15 1555 06/02/15 2033 06/04/15 0250  TROPONINI 0.38* 0.24* 0.20* 0.09*    Recent Results (from the past 240 hour(s))  Urine culture     Status: None   Collection Time: 06/02/15  4:59 AM  Result Value Ref Range Status   Specimen Description URINE, RANDOM  Final   Special Requests NONE  Final   Culture >=100,000 COLONIES/mL CITROBACTER KOSERI  Final   Report Status 06/04/2015 FINAL  Final   Organism ID, Bacteria  CITROBACTER KOSERI  Final      Susceptibility   Citrobacter koseri - MIC*    CEFAZOLIN <=4 SENSITIVE Sensitive     CEFTRIAXONE <=1 SENSITIVE Sensitive     CIPROFLOXACIN <=0.25 SENSITIVE Sensitive     GENTAMICIN <=1 SENSITIVE Sensitive     IMIPENEM <=0.25 SENSITIVE Sensitive     NITROFURANTOIN <=16 SENSITIVE Sensitive     TRIMETH/SULFA <=20 SENSITIVE Sensitive     PIP/TAZO <=4 SENSITIVE Sensitive     * >=100,000 COLONIES/mL CITROBACTER KOSERI  Blood Culture (routine x 2)     Status: None   Collection Time: 06/02/15  5:00 AM  Result Value Ref Range Status   Specimen Description BLOOD WRIST LEFT  Final   Special Requests BOTTLES DRAWN AEROBIC AND ANAEROBIC 5CC  Final   Culture  Setup Time   Final    GRAM POSITIVE COCCI IN CLUSTERS IN BOTH AEROBIC AND ANAEROBIC BOTTLES CRITICAL RESULT CALLED TO, READ BACK BY AND VERIFIED WITH: WILSON@0714  06/03/15 MKELLY    Culture   Final    STAPHYLOCOCCUS SPECIES (COAGULASE NEGATIVE) THE SIGNIFICANCE OF ISOLATING THIS ORGANISM FROM A SINGLE SET OF BLOOD CULTURES WHEN MULTIPLE SETS ARE DRAWN IS UNCERTAIN. PLEASE NOTIFY THE MICROBIOLOGY DEPARTMENT WITHIN ONE WEEK IF SPECIATION AND SENSITIVITIES ARE REQUIRED.    Report Status 06/05/2015 FINAL  Final  Blood Culture (routine x 2)     Status: None   Collection Time: 06/02/15  5:00 AM  Result Value Ref Range Status   Specimen Description BLOOD ARM RIGHT  Final   Special Requests BOTTLES DRAWN AEROBIC AND ANAEROBIC 10CC  Final   Culture  Setup Time   Final    GRAM NEGATIVE RODS IN BOTH AEROBIC AND ANAEROBIC BOTTLES CRITICAL RESULT CALLED TO, READ BACK BY AND VERIFIED WITH: A WILSON RN 1930 06/02/15 A BROWNING    Culture CITROBACTER KOSERI  Final   Report Status 06/04/2015 FINAL  Final   Organism ID, Bacteria CITROBACTER KOSERI  Final      Susceptibility   Citrobacter koseri - MIC*    CEFAZOLIN <=4 SENSITIVE Sensitive     CEFEPIME <=1 SENSITIVE Sensitive     CEFTAZIDIME <=1 SENSITIVE Sensitive      CEFTRIAXONE <=1 SENSITIVE Sensitive     CIPROFLOXACIN <=0.25 SENSITIVE Sensitive     GENTAMICIN <=1 SENSITIVE Sensitive     IMIPENEM <=0.25 SENSITIVE Sensitive     TRIMETH/SULFA <=20 SENSITIVE Sensitive     PIP/TAZO <=4 SENSITIVE Sensitive     * CITROBACTER KOSERI      Time spent in discharge (includes decision making & examination of pt): > 35 minutes  06/05/2015, 2:54 PM   Lonia Blood, MD [facility] Office  [phone] Pager [phone]  On-Call/Text Page:      [email or web address]      password [facility]

## 2015-06-05 NOTE — Care Management Important Message (Signed)
Important Message  Patient Details  Name: Samuel [name] MRN: [id number] Date of Birth: 1960/12/27   Medicare Important Message Given:  Yes-second notification given    Orson Aloe 06/05/2015, 11:50 AM

## 2015-06-05 NOTE — Progress Notes (Signed)
Pt discharged with [facility] transportation. Discharge packet and education given. Family notified of pt's discharge, per family they will be awaiting for him at home.

## 2015-06-05 NOTE — Progress Notes (Signed)
CM received consult for DME: Pt desires bed(Low Loss Air Mattress) he is in at the hospital be arranged for home use - I agree this is a good idea given his immobility and risk of skin breakdown, per MD. Pt without pressure wounds but @ risk for breakdown secondary to immobility. CM offered choice list for DME and pt agreed with [facility]. Referral made with [facility] by CM and CM was informed because of no skin wounds he would not qualify for insurance to cover bed cost. CM and Jermaine explained this to the pt , MD aware. CM received order for [facility] and Aide per MD. Choice again offered to pt for [facility] services and pt agreed with [facility], Referral made with [facility] and Aide, CM made pt aware. Gae Gallop RN,BSN,CM [phone]

## 2015-06-06 ENCOUNTER — Telehealth: Payer: Self-pay | Admitting: Neurology

## 2015-06-06 NOTE — Telephone Encounter (Signed)
Patient's sister is calling to order written Rx hydrocodone-acetaminophen 5/325 mg.  Thanks!

## 2015-06-06 NOTE — Telephone Encounter (Signed)
It appears [name] already wrote this Rx on 09/29, and per note from 09/30, the Rx is ready for pick up.  I called back.  They are aware.

## 2015-06-06 NOTE — Telephone Encounter (Signed)
Patient's sister is callling to let you know her brother has been in the hospital and was released today. No specific message per sister.

## 2015-06-06 NOTE — Telephone Encounter (Signed)
I called the patient's sister. The patient was hospitalized for sepsis. She just wanted to let Dr. Anne Hahn know this. She doesn't necessarily want to schedule a follow up appointment right now because it is so hard to get the patient to and from appointments. She stated if Dr. Anne Hahn needed to see him we could schedule an appointment. I advised that I would let Dr. Anne Hahn know what is going on and we would call back if Dr. Anne Hahn had any additional questions or concerns or if we need to schedule an appointment.

## 2015-06-06 NOTE — Telephone Encounter (Signed)
Events noted. Patient has progressed significantly over time, he is likely getting to end-stage point, end-of-life issues. I did not call the patient.

## 2015-06-08 ENCOUNTER — Ambulatory Visit: Payer: Self-pay | Admitting: Neurology

## 2015-06-27 ENCOUNTER — Other Ambulatory Visit: Payer: Self-pay | Admitting: Neurology

## 2015-06-27 NOTE — Telephone Encounter (Signed)
Rx signed and faxed.

## 2015-06-29 NOTE — Telephone Encounter (Signed)
Error

## 2015-06-30 NOTE — Telephone Encounter (Signed)
Error

## 2015-07-16 ENCOUNTER — Other Ambulatory Visit: Payer: Self-pay | Admitting: Neurology

## 2015-07-18 ENCOUNTER — Telehealth: Payer: Self-pay | Admitting: Neurology

## 2015-07-18 NOTE — Telephone Encounter (Signed)
Pt called inquiring if she needs to be seen prior to his appt i Feb 2107. He has missed 2 appts and is unsure if he should be seen sooner.

## 2015-07-19 NOTE — Telephone Encounter (Signed)
I called the patient. He has no new MS symptoms right now but wanted an appointment before February. Appointment scheduled 12/6. He will call back if he needs [place] before then.

## 2015-08-07 ENCOUNTER — Telehealth: Payer: Self-pay

## 2015-08-07 ENCOUNTER — Other Ambulatory Visit: Payer: Self-pay | Admitting: Neurology

## 2015-08-07 MED ORDER — HYDROCODONE-ACETAMINOPHEN 5-325 MG PO TABS: 1.0000 | ORAL_TABLET | Freq: Four times a day (QID) | ORAL | Status: DC | PRN

## 2015-08-07 NOTE — Telephone Encounter (Signed)
Rx ready for pick up. 

## 2015-08-07 NOTE — Telephone Encounter (Signed)
Request entered, forwarded to provider for review.   I spoke with Ms Tania Ade who said they may have been getting samples of Vesicare and Myrbetriq from PCP.  She will check with that office and call us back if needed.  Says they have just been getting samples for quite some time, as they are afraid the Rx will cost too much.

## 2015-08-07 NOTE — Telephone Encounter (Signed)
Pt's sister called requesting samples of solifenacin (VESICARE) 10 MG tablet and mirabegron ER (MYRBETRIQ) 50 MG TB24 tablet . Also need RX for HYDROcodone-acetaminophen (NORCO/VICODIN) 5-325 MG tablet. He has an appt tomorrow and will pick these up then.

## 2015-08-08 ENCOUNTER — Encounter: Payer: Self-pay | Admitting: Neurology

## 2015-08-08 ENCOUNTER — Ambulatory Visit (INDEPENDENT_AMBULATORY_CARE_PROVIDER_SITE_OTHER): Payer: Medicare Other | Admitting: Neurology

## 2015-08-08 VITALS — BP 120/78 | HR 88 | Resp 18

## 2015-08-08 DIAGNOSIS — G8114 Spastic hemiplegia affecting left nondominant side: Secondary | ICD-10-CM

## 2015-08-08 DIAGNOSIS — G35 Multiple sclerosis: Secondary | ICD-10-CM | POA: Diagnosis not present

## 2015-08-08 MED ORDER — CLONAZEPAM 1 MG PO TABS: ORAL_TABLET | ORAL | Status: DC

## 2015-08-08 NOTE — Patient Instructions (Signed)
Stop the alprazolam and go up on the clonazepam, taking one tablet in the morning and 2 in the evening.   Multiple Sclerosis Multiple sclerosis (MS) is a disease of the central nervous system. It leads to the loss of the insulating covering of the nerves (myelin sheath) of your brain. When this happens, brain signals do not get sent properly or may not get sent at all. The age of onset of MS varies.  CAUSES The cause of MS is unknown. However, it is more common in the [place] than in the [place]. RISK FACTORS There is a higher number of women with MS than men. MS is not an illness that is passed down to you from your family members (inherited). However, your risk of MS is higher if you have a relative with MS. SIGNS AND SYMPTOMS  The symptoms of MS occur in episodes or attacks. These attacks may last weeks to months. There may be long periods of almost no symptoms between attacks. The symptoms of MS vary. This is because of the many different ways it affects the central nervous system. The main symptoms of MS include:  Vision problems and eye pain.  Numbness.  Weakness.  Inability to move your arms, hands, feet, or legs (paralysis).  Balance problems.  Tremors. DIAGNOSIS  Your health care provider can diagnose MS with the help of imaging exams and lab tests. These may include specialized X-ray exams and spinal fluid tests. The best imaging exam to confirm a diagnosis of MS is an MRI. TREATMENT  There is no known cure for MS, but there are medicines that can decrease the number and frequency of attacks. Steroids are often used for short-term relief. Physical and occupational therapy may also help. There are also many new alternative or complementary treatments available to help control the symptoms of MS. Ask your health care provider if any of these other options are right for you. HOME CARE INSTRUCTIONS   Take medicines as directed by your health care  provider.  Exercise as directed by your health care provider. SEEK MEDICAL CARE IF: You begin to feel depressed. SEEK IMMEDIATE MEDICAL CARE IF:  You develop paralysis.  You have problems with bladder, bowel, or sexual function.  You develop mental changes, such as forgetfulness or mood swings.  You have a period of uncontrolled movements (seizure).   This information is not intended to replace advice given to you by your health care provider. Make sure you discuss any questions you have with your health care provider.   Document Released: 08/16/2000 Document Revised: 08/24/2013 Document Reviewed: 04/26/2013 Elsevier Interactive Patient Education 2016 [employer]. Multiple Sclerosis Multiple sclerosis (MS) is a disease of the central nervous system. It leads to the loss of the insulating covering of the nerves (myelin sheath) of your brain. When this happens, brain signals do not get sent properly or may not get sent at all. The age of onset of MS varies.  CAUSES The cause of MS is unknown. However, it is more common in the [place] than in the [place]. RISK FACTORS There is a higher number of women with MS than men. MS is not an illness that is passed down to you from your family members (inherited). However, your risk of MS is higher if you have a relative with MS. SIGNS AND SYMPTOMS  The symptoms of MS occur in episodes or attacks. These attacks may last weeks to months. There may be long  periods of almost no symptoms between attacks. The symptoms of MS vary. This is because of the many different ways it affects the central nervous system. The main symptoms of MS include:  Vision problems and eye pain.  Numbness.  Weakness.  Inability to move your arms, hands, feet, or legs (paralysis).  Balance problems.  Tremors. DIAGNOSIS  Your health care provider can diagnose MS with the help of imaging exams and lab tests. These may include specialized  X-ray exams and spinal fluid tests. The best imaging exam to confirm a diagnosis of MS is an MRI. TREATMENT  There is no known cure for MS, but there are medicines that can decrease the number and frequency of attacks. Steroids are often used for short-term relief. Physical and occupational therapy may also help. There are also many new alternative or complementary treatments available to help control the symptoms of MS. Ask your health care provider if any of these other options are right for you. HOME CARE INSTRUCTIONS   Take medicines as directed by your health care provider.  Exercise as directed by your health care provider. SEEK MEDICAL CARE IF: You begin to feel depressed. SEEK IMMEDIATE MEDICAL CARE IF:  You develop paralysis.  You have problems with bladder, bowel, or sexual function.  You develop mental changes, such as forgetfulness or mood swings.  You have a period of uncontrolled movements (seizure).   This information is not intended to replace advice given to you by your health care provider. Make sure you discuss any questions you have with your health care provider.   Document Released: 08/16/2000 Document Revised: 08/24/2013 Document Reviewed: 04/26/2013 Elsevier Interactive Patient Education [employer]

## 2015-08-08 NOTE — Progress Notes (Signed)
Reason for visit: Multiple sclerosis  Samuel [name] is an 54 y.o. male  History of present illness:  Samuel [name] is a 54 year old right-handed white male with a history of multiple sclerosis with a chronic progressive course. The patient has not done well since last seen. He has been in the hospital in October 2016 with sepsis following a urinary tract infection. The patient has an indwelling catheter. He has recovered from the infection, but he continues to have significant disability from his multiple sclerosis. He is living in the home with a caretaker who is there from 8 AM to 8 PM. The patient is eating better, but he has not gained any weight. He has not had any skin breakdown issues. He had received Botox injections for his spasticity in the arms, the last injection was in July 2016. There have been some issue with coverage through his insurance company. The patient indicates that he is not sleeping well, he will go to bed around 11 PM, and wake up at 3 AM, and cannot get back to sleep. He does nap during the day. He takes hydrocodone and Ultram for pain. He still has some spasticity in the legs that may keep him awake at night. He has had some issues with dizziness and vertigo, the Transderm scope patch has been helpful for him. He returns for an evaluation.  Past Medical History  Diagnosis Date  . Hypertension   . GERD (gastroesophageal reflux disease)   . Hypothyroidism   . Vitamin D deficiency   . Chronic back pain   . Gait disorder   . 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 Multiple sclerosis (HCC) NEUROLOGIST-- [name]    dx 1993, JC virus negative 2/14---  CURRENTLY RECEIVING TYSABRI IV TREATMENT  . Pneumonia     dx  03-03-2014--  admitted for iv and oral antibiotics  . Urinary retention   . Neurogenic bladder   . Foley catheter in place   . Incontinence of urine   . Hyperlipidemia   . Bladder calculi   . Shortness of breath     secondary to fatique  . Chronic  fatigue   . Neurogenic bowel     intermittant constipation and diarrhea  . Yeast infection     genital area    Past Surgical History  Procedure Laterality Date  . Appendectomy  age 46  . Nasal septum surgery  1996  . Negative sleep study  1996  . Transthoracic echocardiogram  02-07-2004    MILD LVH/  EF 55-65%  . Insertion of suprapubic catheter N/A 03/21/2014    Procedure: INSERTION OF SUPRAPUBIC CATHETER;  Surgeon: [name], MD;  Location: [facility];  Service: Urology;  Laterality: N/A;  . Cystoscopy N/A 03/21/2014    Procedure: CYSTOSCOPY TREATMENT OF BLADDER CALCULI;  Surgeon: [name], MD;  Location: [facility];  Service: Urology;  Laterality: N/A;    Family History  Problem Relation Age of Onset  . Stroke Father   . Cirrhosis Brother   . Hypothyroidism Sister     Social history:  reports that he quit smoking about 30 years ago. His smoking use included Cigarettes. He has a 12 pack-year smoking history. He has never used smokeless tobacco. He reports that he does not drink alcohol or use illicit drugs.    Allergies  Allergen Reactions  . Penicillins Other (See Comments)    Unknown childhoood reaction (takes amoxicillian with reaction) Has tolerated ceftriaxone,  cefepime, and Primaxin during inpatient admissions    Medications:  Prior to Admission medications   Medication Sig Start Date End Date Taking? Authorizing Provider  ALPRAZolam Prudy Feeler) 1 MG tablet TAKE ONE TABLET BY MOUTH AT BEDTIME AS NEEDED FOR ANXIETY 06/27/15  Yes York Spaniel, MD  baclofen (LIORESAL) 20 MG tablet TAKE TWO TABLETS BY MOUTH THREE TIMES DAILY 07/16/15  Yes York Spaniel, MD  clonazePAM (KLONOPIN) 1 MG tablet TAKE ONE TABLET BY MOUTH TWICE DAILY 02/23/15  Yes York Spaniel, MD  Cranberry-Vitamin C-Probiotic (AZO CRANBERRY PO) Take 1 tablet by mouth 2 (two) times daily.   Yes Historical Provider, MD  dantrolene (DANTRIUM) 100 MG capsule TAKE  ONE CAPSULE BY MOUTH THREE TIMES DAILY 06/27/15  Yes York Spaniel, MD  diazepam (VALIUM) 10 MG tablet Take 10 mg by mouth at bedtime as needed for anxiety.   Yes Historical Provider, MD  HYDROcodone-acetaminophen (NORCO/VICODIN) 5-325 MG tablet Take 1-2 tablets by mouth every 6 (six) hours as needed. 08/07/15  Yes York Spaniel, MD  ibuprofen (ADVIL,MOTRIN) 200 MG tablet Take 600 mg by mouth as needed for fever or moderate pain.    Yes Historical Provider, MD  levothyroxine (SYNTHROID, LEVOTHROID) 125 MCG tablet Take 125 mcg by mouth daily. 05/22/15  Yes Historical Provider, MD  mirabegron ER (MYRBETRIQ) 50 MG TB24 tablet Take 50 mg by mouth daily.   Yes Historical Provider, MD  omeprazole (PRILOSEC) 20 MG capsule Take 20 mg by mouth every morning.    Yes Historical Provider, MD  OnabotulinumtoxinA (BOTOX IJ) Inject 300 Units as directed every 3 (three) months. Administered at MD office   Yes Historical Provider, MD  perphenazine (TRILAFON) 2 MG tablet TAKE ONE TABLET BY MOUTH TWICE DAILY 01/02/15  Yes York Spaniel, MD  Phenazopyridine HCl (AZO TABS PO) Take by mouth 2 (two) times daily.   Yes Historical Provider, MD  scopolamine (TRANSDERM-SCOP, 1.5 MG,) 1 MG/3DAYS Place 1 patch (1.5 mg total) onto the skin every 3 (three) days. 05/18/15  Yes York Spaniel, MD  sertraline (ZOLOFT) 100 MG tablet Take 200 mg by mouth at bedtime.    Yes Historical Provider, MD  solifenacin (VESICARE) 10 MG tablet Take 10 mg by mouth daily.   Yes Historical Provider, MD  tiZANidine (ZANAFLEX) 4 MG capsule Take 8 mg by mouth 3 (three) times daily.   Yes Historical Provider, MD  traMADol (ULTRAM) 50 MG tablet Take 1 tablet (50 mg total) by mouth every 6 (six) hours as needed. Patient taking differently: Take 50 mg by mouth every 6 (six) hours as needed for moderate pain.  11/14/14  Yes York Spaniel, MD  Vitamin D, Ergocalciferol, (DRISDOL) 50000 UNITS CAPS Take 50,000 Units by mouth every 7 (seven) days.  Friday only   Yes Historical Provider, MD  acetic acid 0.25 % irrigation Irrigate with as directed daily. Instill 30ml and clamp tube for 30 minutes then drain. 06/03/15   [name], MD  cephALEXin (KEFLEX) 500 MG capsule Take 1 capsule (500 mg total) by mouth every 12 (twelve) hours. Patient not taking: Reported on 08/08/2015 06/05/15   Lonia Blood, MD    ROS:  Out of a complete 14 system review of symptoms, the patient complains only of the following symptoms, and all other reviewed systems are negative.  Eye discharge, eye itching, eye redness, double vision, loss of vision, eye pain, blurred vision Heat intolerance Weakness  Blood pressure 120/78, pulse 88, resp. rate 18.  Physical Exam  General: The patient is alert and cooperative at the time of the examination.  Skin: No significant peripheral edema is noted.   Neurologic Exam  Mental status: The patient is alert and oriented x 3 at the time of the examination. The patient has apparent normal recent and remote memory, with an apparently normal attention span and concentration ability.   Cranial nerves: Facial symmetry is present. Speech is normal, no aphasia or dysarthria is noted. Extraocular movements are reveal evidence of bilateral INO's. Visual fields are full. Pupils are equal, round, and reactive to light. Discs are flat bilaterally.  Motor: The patient has minimal voluntary control of lower extremities. There is flexion at the knees bilaterally. The patient has a flexion contracture of the right elbow, is able to straighten the left arm. Patient has 3/5 strength in grip bilaterally, he is unable to fully abduct either arm.  Sensory examination: Soft touch sensation is symmetric on the face, arms, and legs.  Coordination: The patient does not have the ability to perform finger-nose-finger or heel-to-shin on either side.  Gait and station: The gait could not be tested, the patient is wheelchair-bound.  Reflexes:  Deep tendon reflexes are symmetric.   Assessment/Plan:  1. Multiple sclerosis  2. Spastic quadriparesis  3. Neurogenic bladder  4. Nonambulatory state  The patient is having ongoing progression of his significant disabilities. The patient reports some cognitive issues on the medications, he feels better when he is not taking his medication. He is not sleeping well at night. The patient will be taken off of the alprazolam, he will take the clonazepam 1 mg in the morning, 2 mg in the evening. He will be given a prescription today for his hydrocodone. He will return in about 6 months. He has had recent blood work done in October 2016 while hospitalized. I will try to determine why the insurance is not covering Botox better for him so he can resume treatments. He has gained benefit with the Botox injections.  Marlan Palau MD 08/08/2015 7:10 PM  [facility] [address] [place], [place] [address]  Phone [phone] Fax [phone]

## 2015-08-12 ENCOUNTER — Other Ambulatory Visit: Payer: Self-pay | Admitting: Neurology

## 2015-08-15 ENCOUNTER — Telehealth: Payer: Self-pay | Admitting: Neurology

## 2015-08-15 DIAGNOSIS — G35 Multiple sclerosis: Secondary | ICD-10-CM

## 2015-08-15 DIAGNOSIS — L8992 Pressure ulcer of unspecified site, stage 2: Secondary | ICD-10-CM

## 2015-08-15 NOTE — Telephone Encounter (Signed)
Pt's sister called sts he has bed sores and pt's bottom is raw from lying in bed. She said when he was in hospital in Oct 2016 he was on a low air loft mattress and it helped considerably. She is requesting a script for this mattress to be sent to [facility] p# [phone] f# [phone]

## 2015-08-15 NOTE — Telephone Encounter (Signed)
I called the sister, the patient is developing pressure sores. I will get home health nursing out of the house, they may be able to get the low air loss mattress.

## 2015-08-17 NOTE — Telephone Encounter (Signed)
I called the patient's sister. I advised that I have messaged our [facility] contact about the home health order. I advised that if [facility] is able to provide the home health services then they will contact the patient to schedule first visit. I also advised that if they do not hear anything after the beginning of next week to please let me know.

## 2015-08-17 NOTE — Telephone Encounter (Addendum)
Samuel [name], caregiver, called and wanted to know if referral for home health nurse was done. Family is concerned for the pt due to several bed sores. Also wanted to know about air mattress.[phone] [name] , sister.

## 2015-08-22 NOTE — Telephone Encounter (Signed)
[facility] is not able to help in the patient's area right now. I called [facility] with [facility]. She is working on getting the patient processed now. I called the patient's sister to give her the update.

## 2015-08-23 NOTE — Telephone Encounter (Signed)
Sister [name] [phone] called to advise she hasn't heard from anyone regarding evaluation for bedsore.

## 2015-08-23 NOTE — Telephone Encounter (Signed)
I spoke to the patient's sister. She stated that someone called the home phone yesterday and spoke to the patient's caregiver. The patient's sister is concerned that the home health nurse may call the home phone and the patient may be asleep without a caregiver there at the time. She asked that I give [facility] her number. I called Corrie Dandy and gave her Donna's number and asked that they contact her.

## 2015-08-30 ENCOUNTER — Telehealth: Payer: Self-pay | Admitting: Neurology

## 2015-08-30 DIAGNOSIS — G35 Multiple sclerosis: Secondary | ICD-10-CM

## 2015-08-30 DIAGNOSIS — L8990 Pressure ulcer of unspecified site, unspecified stage: Secondary | ICD-10-CM

## 2015-08-30 DIAGNOSIS — G8250 Quadriplegia, unspecified: Secondary | ICD-10-CM

## 2015-08-30 NOTE — Telephone Encounter (Signed)
Left VO on VM. Left call back number for further questions.

## 2015-08-30 NOTE — Telephone Encounter (Signed)
Samuel [name]/[facility] [phone] called to request that prescription for hospital bed and air mattress be sent to fax# [phone].

## 2015-08-30 NOTE — Telephone Encounter (Signed)
Attempted to call April; voice mailbox full, unable to leave message.

## 2015-08-30 NOTE — Telephone Encounter (Signed)
Samuel [name] with [facility] called for on going orders : pt 1 x a week  for 1 week, and 2 x week for 4 weeks [phone]

## 2015-08-30 NOTE — Telephone Encounter (Signed)
Amanda with [facility] [phone] requesting verbal OT orders for 2x wk x 6 wks.

## 2015-08-30 NOTE — Telephone Encounter (Signed)
It is ok to give verbal order for ot

## 2015-08-31 ENCOUNTER — Telehealth: Payer: Self-pay | Admitting: Neurology

## 2015-08-31 NOTE — Telephone Encounter (Signed)
Pt's caregiver called requesting to make an appt for botox

## 2015-09-05 ENCOUNTER — Telehealth: Payer: Self-pay | Admitting: Neurology

## 2015-09-05 NOTE — Telephone Encounter (Signed)
I will write the order for the hospital bed and air mattress.

## 2015-09-05 NOTE — Telephone Encounter (Signed)
Okay for verbal order for physical therapy.

## 2015-09-05 NOTE — Telephone Encounter (Signed)
I called Tammy. She can get everything scheduled for the patient to get the hospital bed and air mattress but she needs an order for both from Dr. Anne Hahn.

## 2015-09-05 NOTE — Telephone Encounter (Signed)
April with [facility] is calling and states she needs an order for PT 1 X week for 1 week then 2 X week 4 weeks.  Please call.  Thanks!

## 2015-09-05 NOTE — Telephone Encounter (Signed)
Tried calling April again. Mailbox full and cannot LVM.

## 2015-09-06 NOTE — Telephone Encounter (Signed)
I called Samuel [name] and left a message stating that PT 1 x week for 1 week and 2 x week for 4 weeks is okay with Dr. Anne Hahn.

## 2015-09-07 ENCOUNTER — Telehealth: Payer: Self-pay | Admitting: Neurology

## 2015-09-07 MED ORDER — ALPRAZOLAM 1 MG PO TABS: ORAL_TABLET | ORAL | Status: DC

## 2015-09-07 NOTE — Telephone Encounter (Signed)
Returned call and left a VM asking him to call me back.

## 2015-09-07 NOTE — Addendum Note (Signed)
Addended by: Stephanie Acre on: 09/07/2015 05:40 PM   Modules accepted: Orders, Medications

## 2015-09-07 NOTE — Telephone Encounter (Signed)
I spoke to Hatteras (caregiver). He reports that they increased his clonazepam to 2mg  at night, as advised at last office visit, but has not made a difference. Baldo Ash states that patient wakes up many times during the night. What do you advise?

## 2015-09-07 NOTE — Telephone Encounter (Signed)
Pt's caregiver called said on last OV it was discussed that pt would be given sleep medication.  He said pt is waking up constantly all night. He is groggy during the day, forgetful.

## 2015-09-07 NOTE — Telephone Encounter (Signed)
Samuel [name], pts caregiver called and would like to schedule an appt for Botox for the pt. Please call and advise [phone]

## 2015-09-07 NOTE — Telephone Encounter (Signed)
The clonazepam was not effective as a sleeping pill for the patient. We will switch back to the alprazolam. He will discontinue the clonazepam.

## 2015-09-08 NOTE — Telephone Encounter (Signed)
Please see phone note 09/05/15.

## 2015-09-12 NOTE — Telephone Encounter (Signed)
Returned call, left a message asking carl to call me back X2.  Please refer to previous phone note.

## 2015-09-15 ENCOUNTER — Telehealth: Payer: Self-pay | Admitting: Neurology

## 2015-09-15 NOTE — Telephone Encounter (Signed)
Teresa/[facility] [phone] called to advise she faxed order form for Dr. Anne Hahn to sign for semi electric hospital bed and air mattress, she put air mattress on form, it needs to say gel overlay pressure pad. Fax# [phone].

## 2015-09-18 NOTE — Telephone Encounter (Signed)
Baldo Ash, patient's caregiver, called to find out status of order. I advised that I received Teresa's fax today. I will have Dr. Anne Hahn sign it and I will fax it back to [facility].

## 2015-09-18 NOTE — Telephone Encounter (Signed)
Order signed and faxed to [facility]. Confirmation received.

## 2015-09-18 NOTE — Telephone Encounter (Signed)
Samuel [name]/[facility] called to check status of form for semi electric hospital bed (what kind of hospital bed?), and air mattress, overlay pressure pad.

## 2015-09-24 ENCOUNTER — Other Ambulatory Visit: Payer: Self-pay | Admitting: Neurology

## 2015-09-26 ENCOUNTER — Telehealth: Payer: Self-pay | Admitting: Neurology

## 2015-09-26 MED ORDER — HYDROCODONE-ACETAMINOPHEN 5-325 MG PO TABS: 1.0000 | ORAL_TABLET | Freq: Four times a day (QID) | ORAL | Status: DC | PRN

## 2015-09-26 MED ORDER — TRAZODONE HCL 100 MG PO TABS: 200.0000 mg | ORAL_TABLET | Freq: Every day | ORAL | Status: DC

## 2015-09-26 NOTE — Telephone Encounter (Signed)
Pt's caregiver  Okey Regal, called and wanted to know about getting something to help the pt sleep. He also wants to know about botox to inject in his bladder. He also has concern about his insurance covering the botox. Pt also needs refill on HYDROcodone-acetaminophen (NORCO/VICODIN) 5-325 MG tablet. Okey Regal phone: [phone], may leave a message

## 2015-09-26 NOTE — Telephone Encounter (Signed)
I called the patient, talk with the caretaker, Baldo Ash. The patient is having a lot of issues with bladder spasms even with the suprapubic catheter in place. He needs to contact his urology doctor, Botox injections could be of some benefit. The patient will have a prescription called in for trazodone for sleep, he already takes 2 mg of alprazolam at night. The patient will be given a prescription for the hydrocodone.

## 2015-09-27 ENCOUNTER — Telehealth: Payer: Self-pay

## 2015-09-27 NOTE — Telephone Encounter (Signed)
Rx ready for pick up. 

## 2015-10-04 NOTE — Telephone Encounter (Signed)
Melissa/[facility], [employer] [phone] called to check status of request for gel overlay pressure pad.

## 2015-10-04 NOTE — Telephone Encounter (Signed)
I called Samuel [name]. I advised that I faxed the order to [facility] 09/18/15. She is going to check to see if the patient has the gel overlay when she goes back tomorrow. She will let me know.

## 2015-10-10 ENCOUNTER — Encounter (HOSPITAL_COMMUNITY): Payer: Self-pay | Admitting: Nurse Practitioner

## 2015-10-10 ENCOUNTER — Inpatient Hospital Stay (HOSPITAL_COMMUNITY)
Admission: EM | Admit: 2015-10-10 | Discharge: 2015-10-13 | DRG: 698 | Disposition: A | Discharge status: Home / Self Care | Payer: Medicare Other | Attending: Internal Medicine | Admitting: Internal Medicine

## 2015-10-10 ENCOUNTER — Inpatient Hospital Stay (HOSPITAL_COMMUNITY): Payer: Medicare Other

## 2015-10-10 ENCOUNTER — Emergency Department (HOSPITAL_COMMUNITY): Payer: Medicare Other

## 2015-10-10 DIAGNOSIS — Z681 Body mass index (BMI) 19 or less, adult: Secondary | ICD-10-CM | POA: Diagnosis not present

## 2015-10-10 DIAGNOSIS — K529 Noninfective gastroenteritis and colitis, unspecified: Secondary | ICD-10-CM | POA: Diagnosis present

## 2015-10-10 DIAGNOSIS — G9340 Encephalopathy, unspecified: Secondary | ICD-10-CM | POA: Diagnosis present

## 2015-10-10 DIAGNOSIS — R569 Unspecified convulsions: Secondary | ICD-10-CM | POA: Diagnosis present

## 2015-10-10 DIAGNOSIS — Z87891 Personal history of nicotine dependence: Secondary | ICD-10-CM

## 2015-10-10 DIAGNOSIS — I10 Essential (primary) hypertension: Secondary | ICD-10-CM | POA: Diagnosis present

## 2015-10-10 DIAGNOSIS — Z7401 Bed confinement status: Secondary | ICD-10-CM | POA: Diagnosis not present

## 2015-10-10 DIAGNOSIS — R112 Nausea with vomiting, unspecified: Secondary | ICD-10-CM | POA: Diagnosis present

## 2015-10-10 DIAGNOSIS — G35 Multiple sclerosis: Secondary | ICD-10-CM | POA: Diagnosis present

## 2015-10-10 DIAGNOSIS — G8250 Quadriplegia, unspecified: Secondary | ICD-10-CM | POA: Diagnosis present

## 2015-10-10 DIAGNOSIS — N390 Urinary tract infection, site not specified: Secondary | ICD-10-CM | POA: Diagnosis present

## 2015-10-10 DIAGNOSIS — T83518A Infection and inflammatory reaction due to other urinary catheter, initial encounter: Secondary | ICD-10-CM | POA: Diagnosis present

## 2015-10-10 DIAGNOSIS — T360X5A Adverse effect of penicillins, initial encounter: Secondary | ICD-10-CM | POA: Diagnosis present

## 2015-10-10 DIAGNOSIS — E440 Moderate protein-calorie malnutrition: Secondary | ICD-10-CM | POA: Diagnosis present

## 2015-10-10 LAB — COMPREHENSIVE METABOLIC PANEL
ALT: 11 U/L — ABNORMAL LOW (ref 17–63)
AST: 22 U/L (ref 15–41)
Albumin: 4.7 g/dL (ref 3.5–5.0)
Alkaline Phosphatase: 115 U/L (ref 38–126)
Anion gap: 12 (ref 5–15)
BUN: 11 mg/dL (ref 6–20)
CO2: 26 mmol/L (ref 22–32)
Calcium: 10 mg/dL (ref 8.9–10.3)
Chloride: 101 mmol/L (ref 101–111)
Creatinine, Ser: 0.58 mg/dL — ABNORMAL LOW (ref 0.61–1.24)
GFR calc Af Amer: >60 mL/min (ref >60)
GFR calc non Af Amer: >60 mL/min (ref >60)
Glucose, Bld: 101 mg/dL — ABNORMAL HIGH (ref 65–99)
Potassium: 4 mmol/L (ref 3.5–5.1)
Sodium: 139 mmol/L (ref 135–145)
Total Bilirubin: 0.9 mg/dL (ref 0.3–1.2)
Total Protein: 7.9 g/dL (ref 6.5–8.1)

## 2015-10-10 LAB — CBC WITH DIFFERENTIAL/PLATELET
Basophils Absolute: 0 10*3/uL (ref 0.0–0.1)
Basophils Relative: 0 %
Eosinophils Absolute: 0 10*3/uL (ref 0.0–0.7)
Eosinophils Relative: 0 %
HCT: 46.1 % (ref 39.0–52.0)
Hemoglobin: 15.7 g/dL (ref 13.0–17.0)
Lymphocytes Relative: 9 %
Lymphs Abs: 1 10*3/uL (ref 0.7–4.0)
MCH: 29.8 pg (ref 26.0–34.0)
MCHC: 34.1 g/dL (ref 30.0–36.0)
MCV: 87.5 fL (ref 78.0–100.0)
Monocytes Absolute: 0.7 10*3/uL (ref 0.1–1.0)
Monocytes Relative: 6 %
Neutro Abs: 10 10*3/uL — ABNORMAL HIGH (ref 1.7–7.7)
Neutrophils Relative %: 85 %
Platelets: 215 10*3/uL (ref 150–400)
RBC: 5.27 MIL/uL (ref 4.22–5.81)
RDW: 14 % (ref 11.5–15.5)
WBC: 11.7 10*3/uL — ABNORMAL HIGH (ref 4.0–10.5)

## 2015-10-10 LAB — URINE MICROSCOPIC-ADD ON

## 2015-10-10 LAB — URINALYSIS, ROUTINE W REFLEX MICROSCOPIC
Bilirubin Urine: NEGATIVE
Glucose, UA: NEGATIVE mg/dL
Ketones, ur: 40 mg/dL — AB
Nitrite: POSITIVE — AB
Protein, ur: 30 mg/dL — AB
Specific Gravity, Urine: 1.021 (ref 1.005–1.030)
pH: 6 (ref 5.0–8.0)

## 2015-10-10 LAB — I-STAT CG4 LACTIC ACID, ED: Lactic Acid, Venous: 1.23 mmol/L (ref 0.5–2.0)

## 2015-10-10 MED ORDER — ACETAMINOPHEN 650 MG RE SUPP: 650.0000 mg | Freq: Four times a day (QID) | RECTAL | Status: DC | PRN

## 2015-10-10 MED ORDER — ENOXAPARIN SODIUM 40 MG/0.4ML ~~LOC~~ SOLN
40.0000 mg | Freq: Every day | SUBCUTANEOUS | Status: DC
  Administered 2015-10-10 – 2015-10-12 (×3): 40 mg via SUBCUTANEOUS
  Filled 2015-10-10 (×3): qty 0.4

## 2015-10-10 MED ORDER — ONDANSETRON HCL 4 MG PO TABS: 4.0000 mg | ORAL_TABLET | Freq: Four times a day (QID) | ORAL | Status: DC | PRN

## 2015-10-10 MED ORDER — SCOPOLAMINE 1 MG/3DAYS TD PT72
1.0000 | MEDICATED_PATCH | TRANSDERMAL | Status: DC
  Administered 2015-10-11: 1.5 mg via TRANSDERMAL
  Filled 2015-10-10 (×2): qty 1

## 2015-10-10 MED ORDER — PERPHENAZINE 2 MG PO TABS
2.0000 mg | ORAL_TABLET | Freq: Two times a day (BID) | ORAL | Status: DC
  Administered 2015-10-10 – 2015-10-13 (×6): 2 mg via ORAL
  Filled 2015-10-10 (×7): qty 1

## 2015-10-10 MED ORDER — SODIUM CHLORIDE 0.9 % IV SOLN
INTRAVENOUS | Status: AC
  Administered 2015-10-10 – 2015-10-11 (×3): via INTRAVENOUS

## 2015-10-10 MED ORDER — TRAMADOL HCL 50 MG PO TABS: 50.0000 mg | ORAL_TABLET | Freq: Four times a day (QID) | ORAL | Status: DC | PRN

## 2015-10-10 MED ORDER — VITAMIN D (ERGOCALCIFEROL) 1.25 MG (50000 UNIT) PO CAPS
50000.0000 [IU] | ORAL_CAPSULE | ORAL | Status: DC
  Administered 2015-10-13: 50000 [IU] via ORAL
  Filled 2015-10-10: qty 1

## 2015-10-10 MED ORDER — ACETAMINOPHEN 500 MG PO TABS
1000.0000 mg | ORAL_TABLET | Freq: Once | ORAL | Status: AC
  Administered 2015-10-10: 1000 mg via ORAL
  Filled 2015-10-10: qty 2

## 2015-10-10 MED ORDER — LEVOTHYROXINE SODIUM 25 MCG PO TABS
125.0000 ug | ORAL_TABLET | Freq: Every day | ORAL | Status: DC
  Administered 2015-10-11 – 2015-10-13 (×3): 125 ug via ORAL
  Filled 2015-10-10 (×3): qty 1

## 2015-10-10 MED ORDER — LISINOPRIL 10 MG PO TABS
10.0000 mg | ORAL_TABLET | Freq: Every day | ORAL | Status: DC
  Administered 2015-10-11 – 2015-10-13 (×3): 10 mg via ORAL
  Filled 2015-10-10 (×4): qty 1

## 2015-10-10 MED ORDER — TIZANIDINE HCL 4 MG PO TABS
8.0000 mg | ORAL_TABLET | Freq: Three times a day (TID) | ORAL | Status: DC
  Administered 2015-10-10 – 2015-10-13 (×7): 8 mg via ORAL
  Filled 2015-10-10 (×10): qty 2

## 2015-10-10 MED ORDER — DANTROLENE SODIUM 100 MG PO CAPS
100.0000 mg | ORAL_CAPSULE | Freq: Three times a day (TID) | ORAL | Status: DC
Start: 2015-10-10 — End: 2015-10-13
  Administered 2015-10-10 – 2015-10-13 (×7): 100 mg via ORAL
  Filled 2015-10-10 (×10): qty 1

## 2015-10-10 MED ORDER — ONDANSETRON HCL 4 MG/2ML IJ SOLN
4.0000 mg | Freq: Once | INTRAMUSCULAR | Status: AC
  Administered 2015-10-10: 4 mg via INTRAVENOUS
  Filled 2015-10-10: qty 2

## 2015-10-10 MED ORDER — HYDRALAZINE HCL 20 MG/ML IJ SOLN
10.0000 mg | INTRAMUSCULAR | Status: DC | PRN
Start: 2015-10-10 — End: 2015-10-13
  Administered 2015-10-12 (×2): 10 mg via INTRAVENOUS
  Filled 2015-10-10 (×2): qty 1

## 2015-10-10 MED ORDER — PANTOPRAZOLE SODIUM 40 MG PO TBEC
40.0000 mg | DELAYED_RELEASE_TABLET | Freq: Every day | ORAL | Status: DC
  Administered 2015-10-11 – 2015-10-13 (×3): 40 mg via ORAL
  Filled 2015-10-10 (×3): qty 1

## 2015-10-10 MED ORDER — BACLOFEN 10 MG PO TABS
40.0000 mg | ORAL_TABLET | Freq: Three times a day (TID) | ORAL | Status: DC
  Administered 2015-10-10 – 2015-10-13 (×7): 40 mg via ORAL
  Filled 2015-10-10 (×7): qty 4

## 2015-10-10 MED ORDER — TRAZODONE HCL 50 MG PO TABS
200.0000 mg | ORAL_TABLET | Freq: Every day | ORAL | Status: DC
  Administered 2015-10-10 – 2015-10-12 (×3): 200 mg via ORAL
  Filled 2015-10-10 (×3): qty 4

## 2015-10-10 MED ORDER — ONDANSETRON HCL 4 MG/2ML IJ SOLN: 4.0000 mg | Freq: Four times a day (QID) | INTRAMUSCULAR | Status: DC | PRN

## 2015-10-10 MED ORDER — CIPROFLOXACIN IN D5W 400 MG/200ML IV SOLN
400.0000 mg | Freq: Once | INTRAVENOUS | Status: AC
  Administered 2015-10-10: 400 mg via INTRAVENOUS
  Filled 2015-10-10: qty 200

## 2015-10-10 MED ORDER — SERTRALINE HCL 100 MG PO TABS
200.0000 mg | ORAL_TABLET | Freq: Every day | ORAL | Status: DC
  Administered 2015-10-10 – 2015-10-12 (×3): 200 mg via ORAL
  Filled 2015-10-10 (×3): qty 2

## 2015-10-10 MED ORDER — ACETAMINOPHEN 325 MG PO TABS: 650.0000 mg | ORAL_TABLET | Freq: Four times a day (QID) | ORAL | Status: DC | PRN

## 2015-10-10 MED ORDER — SODIUM CHLORIDE 0.9 % IV SOLN
500.0000 mg | Freq: Three times a day (TID) | INTRAVENOUS | Status: DC
  Administered 2015-10-10 – 2015-10-11 (×4): 500 mg via INTRAVENOUS
  Filled 2015-10-10 (×6): qty 500

## 2015-10-10 MED ORDER — DARIFENACIN HYDROBROMIDE ER 7.5 MG PO TB24
7.5000 mg | ORAL_TABLET | Freq: Every day | ORAL | Status: DC
  Administered 2015-10-11 – 2015-10-13 (×3): 7.5 mg via ORAL
  Filled 2015-10-10 (×3): qty 1

## 2015-10-10 MED ORDER — SODIUM CHLORIDE 0.9 % IV BOLUS (SEPSIS)
1000.0000 mL | Freq: Once | INTRAVENOUS | Status: AC
  Administered 2015-10-10: 1000 mL via INTRAVENOUS

## 2015-10-10 MED ORDER — CLONAZEPAM 1 MG PO TABS: 1.0000 mg | ORAL_TABLET | Freq: Three times a day (TID) | ORAL | Status: DC | PRN

## 2015-10-10 MED ORDER — MIRABEGRON ER 50 MG PO TB24
50.0000 mg | ORAL_TABLET | Freq: Every day | ORAL | Status: DC
  Administered 2015-10-11 – 2015-10-13 (×3): 50 mg via ORAL
  Filled 2015-10-10 (×3): qty 1

## 2015-10-10 MED ORDER — HYDROCODONE-ACETAMINOPHEN 5-325 MG PO TABS
1.0000 | ORAL_TABLET | Freq: Four times a day (QID) | ORAL | Status: DC | PRN
  Administered 2015-10-11: 1 via ORAL
  Administered 2015-10-11: 2 via ORAL
  Filled 2015-10-10: qty 1
  Filled 2015-10-10: qty 2

## 2015-10-10 NOTE — ED Notes (Signed)
Pt is presented from home, bed bound, reportedly has not been eating, n/v and fever. Somewhat altered as he keep repeating same thing, "I think I am dehydrated." Pt has suprapubic catheter, draining conc urine.

## 2015-10-10 NOTE — ED Provider Notes (Signed)
CSN: [id number]     Arrival date & time 10/10/15  1649 History   First MD Initiated Contact with Patient 10/10/15 1807     Chief Complaint  Patient presents with  . Malaise   . Fever  . R/O UTI      (Consider location/radiation/quality/duration/timing/severity/associated sxs/prior Treatment) HPI Comments: Patient with a history of MS, bedridden, presents with fatigue and fever. He has an indwelling suprapubic catheter. He states over last 2 days he's had some low-grade fevers up to 99. He's been increasingly fatigue. He's had some associated nausea and vomiting and hasn't been able to keep anything down today. He had one large loose stool this morning but no other episodes of diarrhea. Had a little bit of a cough. He is at his baseline mental status per family. He's had some prior pressure sores but denies any issues currently. He denies abdominal pain. No chest pain or shortness of breath.  Patient is a 55 y.o. male presenting with fever.  Fever Associated symptoms: diarrhea, nausea and vomiting   Associated symptoms: no chest pain, no chills, no congestion, no cough, no headaches, no rash and no rhinorrhea     Past Medical History  Diagnosis Date  . Hypertension   . GERD (gastroesophageal reflux disease)   . Hypothyroidism   . Vitamin D deficiency   . Chronic back pain   . Gait disorder   . 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 Multiple sclerosis (HCC) NEUROLOGIST-- [name]    dx 1993, JC virus negative 2/14---  CURRENTLY RECEIVING TYSABRI IV TREATMENT  . Pneumonia     dx  03-03-2014--  admitted for iv and oral antibiotics  . Urinary retention   . Neurogenic bladder   . Foley catheter in place   . Incontinence of urine   . Hyperlipidemia   . Bladder calculi   . Shortness of breath     secondary to fatique  . Chronic fatigue   . Neurogenic bowel     intermittant constipation and diarrhea  . Yeast infection     genital area   Past Surgical History  Procedure  Laterality Date  . Appendectomy  age 74  . Nasal septum surgery  1996  . Negative sleep study  1996  . Transthoracic echocardiogram  02-07-2004    MILD LVH/  EF 55-65%  . Insertion of suprapubic catheter N/A 03/21/2014    Procedure: INSERTION OF SUPRAPUBIC CATHETER;  Surgeon: [name], MD;  Location: [facility];  Service: Urology;  Laterality: N/A;  . Cystoscopy N/A 03/21/2014    Procedure: CYSTOSCOPY TREATMENT OF BLADDER CALCULI;  Surgeon: [name], MD;  Location: [facility];  Service: Urology;  Laterality: N/A;   Family History  Problem Relation Age of Onset  . Stroke Father   . Cirrhosis Brother   . Hypothyroidism Sister    Social History  Substance Use Topics  . Smoking status: Former Smoker -- 1.50 packs/day for 8 years    Types: Cigarettes    Quit date: 06/17/1985  . Smokeless tobacco: Never Used  . Alcohol Use: No    Review of Systems  Constitutional: Positive for fever and fatigue. Negative for chills and diaphoresis.  HENT: Negative for congestion, rhinorrhea and sneezing.   Eyes: Negative.   Respiratory: Negative for cough, chest tightness and shortness of breath.   Cardiovascular: Negative for chest pain and leg swelling.  Gastrointestinal: Positive for nausea, vomiting and diarrhea. Negative for abdominal pain and blood  in stool.  Genitourinary: Negative for frequency, hematuria, flank pain and difficulty urinating.  Musculoskeletal: Negative for back pain and arthralgias.  Skin: Negative for rash.  Neurological: Negative for dizziness, speech difficulty, weakness, numbness and headaches.      Allergies  Penicillins  Home Medications   Prior to Admission medications   Medication Sig Start Date End Date Taking? Authorizing Provider  acetic acid 0.25 % irrigation Irrigate with as directed daily. Instill 30ml and clamp tube for 30 minutes then drain. 06/03/15  Yes [name], MD  ALPRAZolam Prudy Feeler) 1 MG tablet 2  tablets at night 09/07/15  Yes York Spaniel, MD  baclofen (LIORESAL) 20 MG tablet TAKE TWO TABLETS BY MOUTH THREE TIMES DAILY Patient taking differently: Take 2 tablets by mouth 3 times daily. 07/16/15  Yes York Spaniel, MD  clonazePAM (KLONOPIN) 1 MG tablet Take 1 mg by mouth 3 (three) times daily as needed for anxiety.  10/04/15  Yes Historical Provider, MD  dantrolene (DANTRIUM) 100 MG capsule TAKE ONE CAPSULE BY MOUTH THREE TIMES DAILY Patient taking differently: Take 1 capsule by mouth 3 times a day. 06/27/15  Yes York Spaniel, MD  HYDROcodone-acetaminophen (NORCO/VICODIN) 5-325 MG tablet Take 1-2 tablets by mouth every 6 (six) hours as needed. Patient taking differently: Take 1-2 tablets by mouth every 6 (six) hours as needed (pain.).  09/26/15  Yes York Spaniel, MD  ibuprofen (ADVIL,MOTRIN) 200 MG tablet Take 600 mg by mouth as needed for fever or moderate pain.    Yes Historical Provider, MD  levothyroxine (SYNTHROID, LEVOTHROID) 125 MCG tablet Take 125 mcg by mouth daily. 05/22/15  Yes Historical Provider, MD  lisinopril-hydrochlorothiazide (PRINZIDE,ZESTORETIC) 10-12.5 MG tablet Take 0.5 tablets by mouth every morning. 09/05/15  Yes Historical Provider, MD  mirabegron ER (MYRBETRIQ) 50 MG TB24 tablet Take 50 mg by mouth daily.   Yes Historical Provider, MD  omeprazole (PRILOSEC) 20 MG capsule Take 20 mg by mouth every morning.    Yes Historical Provider, MD  perphenazine (TRILAFON) 2 MG tablet TAKE ONE TABLET BY MOUTH TWICE DAILY Patient taking differently: Take 1 tablet by mouth twice daily. 08/14/15  Yes York Spaniel, MD  scopolamine (TRANSDERM-SCOP, 1.5 MG,) 1 MG/3DAYS Place 1 patch (1.5 mg total) onto the skin every 3 (three) days. 05/18/15  Yes York Spaniel, MD  sertraline (ZOLOFT) 100 MG tablet Take 200 mg by mouth at bedtime.    Yes Historical Provider, MD  solifenacin (VESICARE) 10 MG tablet Take 10 mg by mouth daily.   Yes Historical Provider, MD  tiZANidine  (ZANAFLEX) 4 MG capsule Take 8 mg by mouth 3 (three) times daily.   Yes Historical Provider, MD  traMADol (ULTRAM) 50 MG tablet TAKE ONE TABLET BY MOUTH EVERY 6 HOURS AS NEEDED Patient taking differently: Take 1 tablet by mouth every 6 hours as needed for pain. 09/25/15  Yes York Spaniel, MD  traZODone (DESYREL) 100 MG tablet Take 2 tablets (200 mg total) by mouth at bedtime. 09/26/15  Yes York Spaniel, MD  Vitamin D, Ergocalciferol, (DRISDOL) 50000 UNITS CAPS Take 50,000 Units by mouth every 7 (seven) days. Friday only   Yes Historical Provider, MD  OnabotulinumtoxinA (BOTOX IJ) Inject 300 Units as directed every 3 (three) months. Administered at MD office    Historical Provider, MD  tiZANidine (ZANAFLEX) 4 MG tablet TAKE ONE TABLET BY MOUTH EVERY 4 HOURS AS NEEDED Patient not taking: Reported on 10/10/2015 08/14/15   York Spaniel, MD  BP 159/101 mmHg  Pulse 98  Temp(Src) 100.1 F (37.8 C) (Rectal)  Resp 20  SpO2 97% Physical Exam  Constitutional: He is oriented to person, place, and time. He appears well-developed and well-nourished.  HENT:  Head: Normocephalic and atraumatic.  Eyes: Pupils are equal, round, and reactive to light.  Neck: Normal range of motion. Neck supple.  Cardiovascular: Normal rate, regular rhythm and normal heart sounds.   Pulmonary/Chest: Effort normal and breath sounds normal. No respiratory distress. He has no wheezes. He has no rales. He exhibits no tenderness.  Abdominal: Soft. Bowel sounds are normal. There is no tenderness. There is no rebound and no guarding.  Positive suprapubic catheter in place. There is no drainage around the site. There some minimal erythema around the site.  Musculoskeletal: Normal range of motion. He exhibits no edema.  Severe extremity weakness  Lymphadenopathy:    He has no cervical adenopathy.  Neurological: He is alert and oriented to person, place, and time.  Skin: Skin is warm and dry. No rash noted.  Psychiatric:  He has a normal mood and affect.    ED Course  Procedures (including critical care time) Labs Review Results for orders placed or performed during the hospital encounter of 10/10/15  Comprehensive metabolic panel  Result Value Ref Range   Sodium 139 135 - 145 mmol/L   Potassium 4.0 3.5 - 5.1 mmol/L   Chloride 101 101 - 111 mmol/L   CO2 26 22 - 32 mmol/L   Glucose, Bld 101 (H) 65 - 99 mg/dL   BUN 11 6 - 20 mg/dL   Creatinine, Ser 9.60 (L) 0.61 - 1.24 mg/dL   Calcium 45.4 8.9 - 09.8 mg/dL   Total Protein 7.9 6.5 - 8.1 g/dL   Albumin 4.7 3.5 - 5.0 g/dL   AST 22 15 - 41 U/L   ALT 11 (L) 17 - 63 U/L   Alkaline Phosphatase 115 38 - 126 U/L   Total Bilirubin 0.9 0.3 - 1.2 mg/dL   GFR calc non Af Amer >60 >60 mL/min   GFR calc Af Amer >60 >60 mL/min   Anion gap 12 5 - 15  CBC with Differential  Result Value Ref Range   WBC 11.7 (H) 4.0 - 10.5 K/uL   RBC 5.27 4.22 - 5.81 MIL/uL   Hemoglobin 15.7 13.0 - 17.0 g/dL   HCT 11.9 14.7 - 82.9 %   MCV 87.5 78.0 - 100.0 fL   MCH 29.8 26.0 - 34.0 pg   MCHC 34.1 30.0 - 36.0 g/dL   RDW 56.2 13.0 - 86.5 %   Platelets 215 150 - 400 K/uL   Neutrophils Relative % 85 %   Neutro Abs 10.0 (H) 1.7 - 7.7 K/uL   Lymphocytes Relative 9 %   Lymphs Abs 1.0 0.7 - 4.0 K/uL   Monocytes Relative 6 %   Monocytes Absolute 0.7 0.1 - 1.0 K/uL   Eosinophils Relative 0 %   Eosinophils Absolute 0.0 0.0 - 0.7 K/uL   Basophils Relative 0 %   Basophils Absolute 0.0 0.0 - 0.1 K/uL  Urinalysis, Routine w reflex microscopic (not at [facility])  Result Value Ref Range   Color, Urine YELLOW YELLOW   APPearance CLOUDY (A) CLEAR   Specific Gravity, Urine 1.021 1.005 - 1.030   pH 6.0 5.0 - 8.0   Glucose, UA NEGATIVE NEGATIVE mg/dL   Hgb urine dipstick LARGE (A) NEGATIVE   Bilirubin Urine NEGATIVE NEGATIVE   Ketones, ur 40 (A) NEGATIVE mg/dL  Protein, ur 30 (A) NEGATIVE mg/dL   Nitrite POSITIVE (A) NEGATIVE   Leukocytes, UA LARGE (A) NEGATIVE  Urine microscopic-add on   Result Value Ref Range   Squamous Epithelial / LPF 0-5 (A) NONE SEEN   WBC, UA TOO NUMEROUS TO COUNT 0 - 5 WBC/hpf   RBC / HPF 6-30 0 - 5 RBC/hpf   Bacteria, UA MANY (A) NONE SEEN   Urine-Other MUCOUS PRESENT   I-Stat CG4 Lactic Acid, ED  Result Value Ref Range   Lactic Acid, Venous 1.23 0.5 - 2.0 mmol/L   Dg Chest 2 View  10/10/2015  CLINICAL DATA:  Nausea, vomiting and fever. Altered mental status. Symptoms today. Initial encounter. EXAM: CHEST  2 VIEW COMPARISON:  Single view of the chest 06/02/2015. PA and lateral chest 07/10/2014. FINDINGS: The lungs are clear. Heart size is normal. No pneumothorax or pleural effusion. No focal bony abnormality. IMPRESSION: No acute disease. Electronically Signed   By: Drusilla Kanner M.D.   On: 10/10/2015 18:03      Imaging Review Dg Chest 2 View  10/10/2015  CLINICAL DATA:  Nausea, vomiting and fever. Altered mental status. Symptoms today. Initial encounter. EXAM: CHEST  2 VIEW COMPARISON:  Single view of the chest 06/02/2015. PA and lateral chest 07/10/2014. FINDINGS: The lungs are clear. Heart size is normal. No pneumothorax or pleural effusion. No focal bony abnormality. IMPRESSION: No acute disease. Electronically Signed   By: Drusilla Kanner M.D.   On: 10/10/2015 18:03   I have personally reviewed and evaluated these images and lab results as part of my medical decision-making.   EKG Interpretation   Date/Time:  Tuesday October 10 2015 17:06:58 EST Ventricular Rate:  98 PR Interval:  101 QRS Duration: 83 QT Interval:  315 QTC Calculation: 402 R Axis:   54 Text Interpretation:  Sinus rhythm Short PR interval Nonspecific T  abnormalities, anterior leads similar to EKG from 03/02/14 Confirmed by  Calton Harshfield  MD, Advith Martine ([id number]) on 10/10/2015 7:19:49 PM      MDM   Final diagnoses:  UTI (lower urinary tract infection)    Patient with MS presents with fever and fatigue. He has an indwelling catheter. His urine does appear infected. He does  not appear septic. His chest x-ray is clear without evidence of pneumonia. He was given IV fluids for his probable dehydration related to the ongoing vomiting. He was started on IV antibiotics. I spoke with the hospitalist, Dr. Toniann Fail who will admit the patient to a MedSurg bed.    Rolan Bucco, MD 10/10/15 [phone]

## 2015-10-10 NOTE — H&P (Signed)
[facility] History and Physical  Samuel [name]:[id number] DOB: 1960/09/19 DOA: 10/10/2015  Referring physician: Dr. Fredderick Phenix. PCP: [name], MD  Specialists: Dr. Anne Hahn. Neurologist.  Chief Complaint: Nausea vomiting.  HPI: Samuel [name] is a 55 y.o. male with history of multiple sclerosis with quadriparesis presents via because of fatigue fevers and nausea vomiting. Patient has been having these symptoms for last 2-3 days. Denies any abdominal pain or diarrhea. Patient has suprapubic catheter. Chest x-ray does not show anything acute. UA shows features consistent with UTI. Patient has been admitted for further management of UTI with nausea and vomiting.   Review of Systems: As presented in the history of presenting illness, rest negative.  Past Medical History  Diagnosis Date  . Hypertension   . GERD (gastroesophageal reflux disease)   . Hypothyroidism   . Vitamin D deficiency   . Chronic back pain   . Gait disorder   . 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 Multiple sclerosis (HCC) NEUROLOGIST-- [name]    dx 1993, JC virus negative 2/14---  CURRENTLY RECEIVING TYSABRI IV TREATMENT  . Pneumonia     dx  03-03-2014--  admitted for iv and oral antibiotics  . Urinary retention   . Neurogenic bladder   . Foley catheter in place   . Incontinence of urine   . Hyperlipidemia   . Bladder calculi   . Shortness of breath     secondary to fatique  . Chronic fatigue   . Neurogenic bowel     intermittant constipation and diarrhea  . Yeast infection     genital area   Past Surgical History  Procedure Laterality Date  . Appendectomy  age 34  . Nasal septum surgery  1996  . Negative sleep study  1996  . Transthoracic echocardiogram  02-07-2004    MILD LVH/  EF 55-65%  . Insertion of suprapubic catheter N/A 03/21/2014    Procedure: INSERTION OF SUPRAPUBIC CATHETER;  Surgeon: [name], MD;  [facility];  Service: Urology;  Laterality:  N/A;  . Cystoscopy N/A 03/21/2014    Procedure: CYSTOSCOPY TREATMENT OF BLADDER CALCULI;  Surgeon: [name], MD;  Location[facility];  Service: Urology;  Laterality: N/A;   Social History:  reports that he quit smoking about 30 years ago. His smoking use included Cigarettes. He has a 12 pack-year smoking history. He has never used smokeless tobacco. He reports that he does not drink alcohol or use illicit drugs. Where does patient live home. Can patient participate in ADLs? No.  Allergies  Allergen Reactions  . Penicillins Other (See Comments)    Unknown childhoood reaction (takes amoxicillian with reaction) Has tolerated ceftriaxone, cefepime, and Primaxin during inpatient admissions    Family History:  Family History  Problem Relation Age of Onset  . Stroke Father   . Cirrhosis Brother   . Hypothyroidism Sister       Prior to Admission medications   Medication Sig Start Date End Date Taking? Authorizing Provider  acetic acid 0.25 % irrigation Irrigate with as directed daily. Instill 30ml and clamp tube for 30 minutes then drain. 06/03/15  Yes [name], MD  ALPRAZolam Prudy Feeler) 1 MG tablet 2 tablets at night 09/07/15  Yes York Spaniel, MD  baclofen (LIORESAL) 20 MG tablet TAKE TWO TABLETS BY MOUTH THREE TIMES DAILY Patient taking differently: Take 2 tablets by mouth 3 times daily. 07/16/15  Yes York Spaniel, MD  clonazePAM Scarlette Calico) 1  MG tablet Take 1 mg by mouth 3 (three) times daily as needed for anxiety.  10/04/15  Yes Historical Provider, MD  dantrolene (DANTRIUM) 100 MG capsule TAKE ONE CAPSULE BY MOUTH THREE TIMES DAILY Patient taking differently: Take 1 capsule by mouth 3 times a day. 06/27/15  Yes York Spaniel, MD  HYDROcodone-acetaminophen (NORCO/VICODIN) 5-325 MG tablet Take 1-2 tablets by mouth every 6 (six) hours as needed. Patient taking differently: Take 1-2 tablets by mouth every 6 (six) hours as needed (pain.).  09/26/15  Yes York Spaniel, MD  ibuprofen (ADVIL,MOTRIN) 200 MG tablet Take 600 mg by mouth as needed for fever or moderate pain.    Yes Historical Provider, MD  levothyroxine (SYNTHROID, LEVOTHROID) 125 MCG tablet Take 125 mcg by mouth daily. 05/22/15  Yes Historical Provider, MD  lisinopril-hydrochlorothiazide (PRINZIDE,ZESTORETIC) 10-12.5 MG tablet Take 0.5 tablets by mouth every morning. 09/05/15  Yes Historical Provider, MD  mirabegron ER (MYRBETRIQ) 50 MG TB24 tablet Take 50 mg by mouth daily.   Yes Historical Provider, MD  omeprazole (PRILOSEC) 20 MG capsule Take 20 mg by mouth every morning.    Yes Historical Provider, MD  perphenazine (TRILAFON) 2 MG tablet TAKE ONE TABLET BY MOUTH TWICE DAILY Patient taking differently: Take 1 tablet by mouth twice daily. 08/14/15  Yes York Spaniel, MD  scopolamine (TRANSDERM-SCOP, 1.5 MG,) 1 MG/3DAYS Place 1 patch (1.5 mg total) onto the skin every 3 (three) days. 05/18/15  Yes York Spaniel, MD  sertraline (ZOLOFT) 100 MG tablet Take 200 mg by mouth at bedtime.    Yes Historical Provider, MD  solifenacin (VESICARE) 10 MG tablet Take 10 mg by mouth daily.   Yes Historical Provider, MD  tiZANidine (ZANAFLEX) 4 MG capsule Take 8 mg by mouth 3 (three) times daily.   Yes Historical Provider, MD  traMADol (ULTRAM) 50 MG tablet TAKE ONE TABLET BY MOUTH EVERY 6 HOURS AS NEEDED Patient taking differently: Take 1 tablet by mouth every 6 hours as needed for pain. 09/25/15  Yes York Spaniel, MD  traZODone (DESYREL) 100 MG tablet Take 2 tablets (200 mg total) by mouth at bedtime. 09/26/15  Yes York Spaniel, MD  Vitamin D, Ergocalciferol, (DRISDOL) 50000 UNITS CAPS Take 50,000 Units by mouth every 7 (seven) days. Friday only   Yes Historical Provider, MD  OnabotulinumtoxinA (BOTOX IJ) Inject 300 Units as directed every 3 (three) months. Administered at MD office    Historical Provider, MD  tiZANidine (ZANAFLEX) 4 MG tablet TAKE ONE TABLET BY MOUTH EVERY 4 HOURS AS  NEEDED Patient not taking: Reported on 10/10/2015 08/14/15   York Spaniel, MD    Physical Exam: Filed Vitals:   10/10/15 1703 10/10/15 1929 10/10/15 2030  BP: 159/101 157/98 163/91  Pulse: 98 98 97  Temp: 100.1 F (37.8 C) 98.3 F (36.8 C) 97.8 F (36.6 C)  TempSrc: Rectal Oral Oral  Resp: Height:    (1.854 m)  Weight:   58.3 kg (128 lb 8.5 oz)  SpO2: 97% 96% 95%     General:  Moderately built and nourished.  Eyes: Anicteric no pallor.  ENT: No discharge from the ears eyes nose and mouth.  Neck: No mass felt.  Cardiovascular: S1-S2 heard.  Respiratory: No rhonchi and crepitations.  Abdomen: Soft nontender bowel sounds present. Suprapubic catheter present. Mild discoloration around the suprapubic catheter.  Skin: Mild discoloration of the suprapubic catheter.  Musculoskeletal: No edema.  Psychiatric: Appears normal.  Neurologic: Alert awake oriented to time place and person. Patient has quadriparesis with contractures.  Labs on Admission:  Basic Metabolic Panel:  Recent Labs Lab 10/10/15 1817  NA 139  K 4.0  CL 101  CO2 26  GLUCOSE 101*  BUN 11  CREATININE 0.58*  CALCIUM 10.0   Liver Function Tests:  Recent Labs Lab 10/10/15 1817  AST 22  ALT 11*  ALKPHOS 115  BILITOT 0.9  PROT 7.9  ALBUMIN 4.7   No results for input(s): LIPASE, AMYLASE in the last 168 hours. No results for input(s): AMMONIA in the last 168 hours. CBC:  Recent Labs Lab 10/10/15 1817  WBC 11.7*  NEUTROABS 10.0*  HGB 15.7  HCT 46.1  MCV 87.5  PLT 215   Cardiac Enzymes: No results for input(s): CKTOTAL, CKMB, CKMBINDEX, TROPONINI in the last 168 hours.  BNP (last 3 results) No results for input(s): BNP in the last 8760 hours.  ProBNP (last 3 results) No results for input(s): PROBNP in the last 8760 hours.  CBG: No results for input(s): GLUCAP in the last 168 hours.  Radiological Exams on Admission: Dg Chest 2 View  10/10/2015  CLINICAL DATA:   Nausea, vomiting and fever. Altered mental status. Symptoms today. Initial encounter. EXAM: CHEST  2 VIEW COMPARISON:  Single view of the chest 06/02/2015. PA and lateral chest 07/10/2014. FINDINGS: The lungs are clear. Heart size is normal. No pneumothorax or pleural effusion. No focal bony abnormality. IMPRESSION: No acute disease. Electronically Signed   By: Drusilla Kanner M.D.   On: 10/10/2015 18:03     Assessment/Plan Principal Problem:   Nausea with vomiting Active Problems:   Multiple sclerosis (HCC)   UTI (lower urinary tract infection)   Essential hypertension   Nausea & vomiting   1. Nausea vomiting probably from UTI - patient has been placed on empiric antibiotics Primaxin for now. Since patient has some discoloration around the catheter site I have ordered CT abdomen. Follow cultures. 2. Hypertension - continue home medications but hold the HCTZ since patient is receiving IV fluids. When necessary IV hydralazine. 3. Multiple sclerosis on antispasmodics.    DVT Prophylaxis Lovenox.  Code Status: Full code.  Family Communication: Discussed with patient.  Disposition Plan: Admit to inpatient.    KAKRAKANDY,ARSHAD N. [facility] Pager [phone].  If 7PM-7AM, please contact night-coverage [email or web address] Password [email or web address] 10/10/2015, 11:07 PM

## 2015-10-10 NOTE — Progress Notes (Signed)
Pharmacy Antibiotic Note  Samuel [name] is a 55 y.o. male admitted on 10/10/2015 with UTI.  Pharmacy has been consulted for Primaxin dosing.  Plan: Primaxin 500mg  IV q8h  Height: 6\' 1"  (185.4 cm) Weight: 128 lb 8.5 oz (58.3 kg) IBW/kg (Calculated) : 79.9  Temp (24hrs), Avg:98.7 F (37.1 C), Min:97.8 F (36.6 C), Max:100.1 F (37.8 C)   Recent Labs Lab 10/10/15 1817 10/10/15 1826  WBC 11.7*  --   CREATININE 0.58*  --   LATICACIDVEN  --  1.23    Estimated Creatinine Clearance: 87 mL/min (by C-G formula based on Cr of 0.58).    Allergies  Allergen Reactions  . Penicillins Other (See Comments)    Unknown childhoood reaction (takes amoxicillian with reaction) Has tolerated ceftriaxone, cefepime, and Primaxin during inpatient admissions    Antimicrobials this admission: 2/7 cipro >> 2/7 2/8 primaxin >>   Dose adjustments this admission:   Microbiology results:  BCx:   UCx: Sputum:  MRSA PCR:   Thank you for allowing pharmacy to be a part of this patient's care.  Lorenza Evangelist 10/10/2015 11:21 PM

## 2015-10-11 DIAGNOSIS — N390 Urinary tract infection, site not specified: Secondary | ICD-10-CM

## 2015-10-11 DIAGNOSIS — I10 Essential (primary) hypertension: Secondary | ICD-10-CM

## 2015-10-11 DIAGNOSIS — G35 Multiple sclerosis: Secondary | ICD-10-CM

## 2015-10-11 DIAGNOSIS — R112 Nausea with vomiting, unspecified: Secondary | ICD-10-CM

## 2015-10-11 LAB — CBC WITH DIFFERENTIAL/PLATELET
Basophils Absolute: 0 10*3/uL (ref 0.0–0.1)
Basophils Relative: 0 %
Eosinophils Absolute: 0 10*3/uL (ref 0.0–0.7)
Eosinophils Relative: 0 %
HCT: 43.4 % (ref 39.0–52.0)
Hemoglobin: 14.2 g/dL (ref 13.0–17.0)
Lymphocytes Relative: 11 %
Lymphs Abs: 1.3 10*3/uL (ref 0.7–4.0)
MCH: 29.5 pg (ref 26.0–34.0)
MCHC: 32.7 g/dL (ref 30.0–36.0)
MCV: 90 fL (ref 78.0–100.0)
Monocytes Absolute: 1 10*3/uL (ref 0.1–1.0)
Monocytes Relative: 8 %
Neutro Abs: 9.5 10*3/uL — ABNORMAL HIGH (ref 1.7–7.7)
Neutrophils Relative %: 81 %
Platelets: 206 10*3/uL (ref 150–400)
RBC: 4.82 MIL/uL (ref 4.22–5.81)
RDW: 14 % (ref 11.5–15.5)
WBC: 11.7 10*3/uL — ABNORMAL HIGH (ref 4.0–10.5)

## 2015-10-11 LAB — COMPREHENSIVE METABOLIC PANEL
ALT: 9 U/L — ABNORMAL LOW (ref 17–63)
AST: 20 U/L (ref 15–41)
Albumin: 4 g/dL (ref 3.5–5.0)
Alkaline Phosphatase: 101 U/L (ref 38–126)
Anion gap: 10 (ref 5–15)
BUN: 10 mg/dL (ref 6–20)
CO2: 23 mmol/L (ref 22–32)
Calcium: 9 mg/dL (ref 8.9–10.3)
Chloride: 102 mmol/L (ref 101–111)
Creatinine, Ser: 0.39 mg/dL — ABNORMAL LOW (ref 0.61–1.24)
GFR calc Af Amer: >60 mL/min (ref >60)
GFR calc non Af Amer: >60 mL/min (ref >60)
Glucose, Bld: 125 mg/dL — ABNORMAL HIGH (ref 65–99)
Potassium: 3.6 mmol/L (ref 3.5–5.1)
Sodium: 135 mmol/L (ref 135–145)
Total Bilirubin: 0.6 mg/dL (ref 0.3–1.2)
Total Protein: 6.5 g/dL (ref 6.5–8.1)

## 2015-10-11 LAB — URINE CULTURE

## 2015-10-11 MED ORDER — VANCOMYCIN HCL IN DEXTROSE 1-5 GM/200ML-% IV SOLN
1000.0000 mg | Freq: Once | INTRAVENOUS | Status: AC
  Administered 2015-10-11: 1000 mg via INTRAVENOUS
  Filled 2015-10-11: qty 200

## 2015-10-11 MED ORDER — ENSURE ENLIVE PO LIQD
237.0000 mL | Freq: Two times a day (BID) | ORAL | Status: DC
  Administered 2015-10-11 – 2015-10-13 (×5): 237 mL via ORAL

## 2015-10-11 MED ORDER — VANCOMYCIN HCL IN DEXTROSE 750-5 MG/150ML-% IV SOLN
750.0000 mg | Freq: Two times a day (BID) | INTRAVENOUS | Status: DC
  Administered 2015-10-12 (×2): 750 mg via INTRAVENOUS
  Filled 2015-10-11 (×3): qty 150

## 2015-10-11 MED ORDER — ALPRAZOLAM 1 MG PO TABS
1.0000 mg | ORAL_TABLET | Freq: Two times a day (BID) | ORAL | Status: DC | PRN
  Administered 2015-10-11: 1 mg via ORAL
  Filled 2015-10-11: qty 1

## 2015-10-11 NOTE — Progress Notes (Signed)
Pharmacy Antibiotic Note  Samuel [name] is a 55 y.o. male admitted on 10/10/2015 with suspected UTI. Pharmacy was consulted for Primaxin dosing. One of two blood cultures is growing Gram positive cocci so pharmacy is asked to start Vancomycin.    Plan: Primaxin  IV q8h. Vancomycin 1g IV x 1 then  IV q12h. Measure Vanc trough at steady state. Follow up renal fxn, culture results, and clinical course.   Height:  (185.4 cm) Weight: 128 lb 8.5 oz (58.3 kg) IBW/kg (Calculated) : 79.9  Temp (24hrs), Avg:98.1 F (36.7 C), Min:97.8 F (36.6 C), Max:98.7 F (37.1 C)   Recent Labs Lab 10/10/15 1817 10/10/15 1826 10/11/15 0439  WBC 11.7*  --  11.7*  CREATININE 0.58*  --  0.39*  LATICACIDVEN  --  1.23  --     Estimated Creatinine Clearance: 87 mL/min (by C-G formula based on Cr of 0.39).    Allergies  Allergen Reactions  . Penicillins Other (See Comments)    Unknown childhoood reaction (takes amoxicillian with reaction) Has tolerated ceftriaxone, cefepime, and Primaxin during inpatient admissions    Antimicrobials this admission: 2/7 >> cipro >> 2/7 2/8 >> primaxin >>  2/8 >> Vanc >> Dose adjustments this admission:   Microbiology results: 2/7 BCx: 1/2 GPC clusters 2/7 UCx: mult species, final  Thank you for allowing pharmacy to be a part of this patient's care.  Charolotte Eke, PharmD, pager [phone]. 10/11/2015,8:31 PM.

## 2015-10-11 NOTE — Progress Notes (Signed)
Initial Nutrition Assessment  DOCUMENTATION CODES:   Non-severe (moderate) malnutrition in context of acute illness/injury  INTERVENTION:   Provide Ensure Enlive po BID, each supplement provides 350 kcal and 20 grams protein.  NUTRITION DIAGNOSIS:   Inadequate oral intake related to vomiting, nausea as evidenced by per patient/family report.  GOAL:   Patient will meet greater than or equal to 90% of their needs  MONITOR:   PO intake, Supplement acceptance, Labs, Weight trends  REASON FOR ASSESSMENT:   Low Braden    ASSESSMENT:   55 y.o. male with history of multiple sclerosis with quadriparesis presents via because of fatigue fevers and nausea vomiting. Patient has been having these symptoms for last 2-3 days.  Pt and family member report nausea and vomiting for two days. Unable to obtain full history due to patients confusion. Prior to this pt had a fine appetite. Nurse reports pt consuming jello, saltine crackers, and ginger ale. Pt reports that he is tolerating these foods.  Family member reports "good deal" of recent weight loss.  States he used to weigh 150#.  Pt weights 128# today, reflecting a 10% weigh loss x5 months, which is significant for the time frame.  Nutrition Focused Physical Exam was completed. Findings include severe muscle depletion, moderate fat depletion, and no edema. Pt is quadriplegic, depletion is to be expected.  Labs reviewed: elevated glucose (125), low creatinine and ALT. Meds reviewed: protonix, zofran, Vitamin D.  Patient amenable to Ensure supplement. RD to order.  Diet Order:  Diet full liquid Room service appropriate?: Yes; Fluid consistency:: Thin  Skin:  Reviewed, no issues  Last BM:  PTA  Height:   Ht Readings from Last 1 Encounters:  10/10/15  (1.854 m)   Weight:   Wt Readings from Last 1 Encounters:  10/10/15 128 lb 8.5 oz (58.3 kg)   Ideal Body Weight:  75.5 kg  BMI:  Body mass index is 16.96  kg/(m^2).  Estimated Nutritional Needs:   Kcal:  1600-1800  Protein:  70-80 grams  Fluid:  1.6-1.8 L  EDUCATION NEEDS:   No education needs identified at this time  Doroteo Glassman, Dietetic Intern Pager: [phone]

## 2015-10-11 NOTE — Progress Notes (Signed)
CRITICAL VALUE ALERT  Critical value received:  Positve blood cx in aerobic bottle: gram + cocci in clusters  Date of notification:  10/10/14  Time of notification:  1930  Critical value read back:Yes.    Nurse who received alert:  S.Kaidin Boehle,RN  MD notified (1st page):  K.Schorr,NP  Time of first page:  1953  MD notified (2nd page):  Time of second page:  Responding MD:  K.Schorr,NP  Time MD responded:  2020

## 2015-10-11 NOTE — Progress Notes (Signed)
[facility] Progress Note   Samuel [name]  ZOX:[id number]  DOB: 1961-01-25  DOA: 10/10/2015 PCP: [name], MD  Brief narrative: Samuel [name] is a 55 y.o. male with multiple sclerosis and resultant quadriparesis and a suprapubic catheter who presents to the hospital with fever (very low grade at 99), nausea and vomiting. He is admitted for the suspicion of a urinary tract infection was started on IV antibiotics   Subjective: Continues to have nausea. Had a small amount of vomiting this morning. Currently does not have any abdominal pain or suprapubic pain or diarrhea. No cough or shortness of breath.  Assessment/Plan: Principal Problem:   Nausea with vomiting - Suspected to be coming from urinary tract infection but may also be related to a gastroenteritis -Abdomen is nontender and CT scan of the abdomen pelvis is unrevealing -Continue symptomatic treatment with antiemetics and will continue with a full liquid diet for now -Will continue IV fluids while he is unable to tolerate orals -Was given IV ciprofloxacin initially but then switched to Primaxin  Active Problems:    UTI (lower urinary tract infection) - He is suspected to have a urinary tract infection due to the presentation of the low-grade fever -According to his mother, the last time he was confused was when he had a urinary tract infection and therefore this may be his symptom which points towards a UTI -Due to the fact that he has a chronic indwelling Foley, his UA will always be positive  Acute encephalopathy -Suspected to be secondary to underlying infection    Essential hypertension - We will continue lisinopril - HCTZ on hold as he is on IV fluids    Multiple sclerosis (HCC) - Bedbound with chronic Foley catheter-continue Mirabegron, Vesicare, baclofen and Zanaflex  Antibiotics: Anti-infectives    Start     Dose/Rate Route Frequency Ordered Stop   10/10/15 2359  imipenem-cilastatin (PRIMAXIN)  500 mg in sodium chloride 0.9 % 100 mL IVPB     500 mg 200 mL/hr over 30 Minutes Intravenous Every 8 hours 10/10/15 2319     10/10/15 1930  ciprofloxacin (CIPRO) IVPB 400 mg     400 mg 200 mL/hr over 60 Minutes Intravenous  Once 10/10/15 1918 10/10/15 2110     Code Status:     Code Status Orders        Start     Ordered   10/10/15 2306  Full code   Continuous     10/10/15 2306    Code Status History    Date Active Date Inactive Code Status Order ID Comments User Context   06/02/2015  9:04 AM 06/05/2015 10:57 PM Full Code [id number]  Richarda Overlie, MD ED   07/10/2014  1:22 PM 07/14/2014  8:50 PM Full Code [id number]  Osvaldo Shipper, MD Inpatient   03/02/2014 10:36 PM 03/03/2014  5:50 PM Full Code [id number]  Haydee Monica, MD Inpatient     Family Communication: mother at bedside Disposition Plan: home in 2-3 days based on when he is able to eat  DVT prophylaxis: Lovenox Consultants:  Procedures:     Objective: Filed Weights   10/10/15 2030  Weight: 58.3 kg (128 lb 8.5 oz)    Intake/Output Summary (Last 24 hours) at 10/11/15 1437 Last data filed at 10/11/15 0600  Gross per 24 hour  Intake    925 ml  Output    250 ml  Net    675 ml     Vitals Filed Vitals:  10/10/15 1929 10/10/15 2030 10/11/15 0456 10/11/15 1039  BP: 157/98 163/91 180/92 184/85  Pulse: 98 97 108   Temp: 98.3 F (36.8 C) 97.8 F (36.6 C) 98.1 F (36.7 C)   TempSrc: Oral Oral Oral   Resp: 22 18 18    Height:  6\' 1"  (1.854 m)    Weight:  58.3 kg (128 lb 8.5 oz)    SpO2: 96% 95% 98%     Exam:  General:  Pt is alert, not in acute distress  HEENT: No icterus, No thrush, oral mucosa moist  Cardiovascular: regular rate and rhythm, S1/S2 No murmur  Respiratory: clear to auscultation bilaterally   Abdomen: Soft, +Bowel sounds, non tender, non distended, no guarding  MSK: No cyanosis or clubbing- no pedal edema   Data Reviewed: Basic Metabolic Panel:  Recent Labs Lab 10/10/15 1817  10/11/15 0439  NA 139 135  K 4.0 3.6  CL 101 102  CO2 26 23  GLUCOSE 101* 125*  BUN 11 10  CREATININE 0.58* 0.39*  CALCIUM 10.0 9.0   Liver Function Tests:  Recent Labs Lab 10/10/15 1817 10/11/15 0439  AST 22 20  ALT 11* 9*  ALKPHOS 115 101  BILITOT 0.9 0.6  PROT 7.9 6.5  ALBUMIN 4.7 4.0   No results for input(s): LIPASE, AMYLASE in the last 168 hours. No results for input(s): AMMONIA in the last 168 hours. CBC:  Recent Labs Lab 10/10/15 1817 10/11/15 0439  WBC 11.7* 11.7*  NEUTROABS 10.0* 9.5*  HGB 15.7 14.2  HCT 46.1 43.4  MCV 87.5 90.0  PLT 215 206   Cardiac Enzymes: No results for input(s): CKTOTAL, CKMB, CKMBINDEX, TROPONINI in the last 168 hours. BNP (last 3 results) No results for input(s): BNP in the last 8760 hours.  ProBNP (last 3 results) No results for input(s): PROBNP in the last 8760 hours.  CBG: No results for input(s): GLUCAP in the last 168 hours.  Recent Results (from the past 240 hour(s))  Urine culture     Status: None (Preliminary result)   Collection Time: 10/10/15  5:29 PM  Result Value Ref Range Status   Specimen Description URINE, SUPRAPUBIC  Final   Special Requests NONE  Final   Culture   Final    TOO YOUNG TO READ Performed at [facility]    Report Status PENDING  Incomplete  Culture, blood (routine x 2)     Status: None (Preliminary result)   Collection Time: 10/10/15  6:10 PM  Result Value Ref Range Status   Specimen Description BLOOD LEFT WRIST  Final   Special Requests BOTTLES DRAWN AEROBIC AND ANAEROBIC 5 CC EACH  Final   Culture   Final    NO GROWTH < 24 HOURS Performed at [facility]    Report Status PENDING  Incomplete  Culture, blood (routine x 2)     Status: None (Preliminary result)   Collection Time: 10/10/15  6:30 PM  Result Value Ref Range Status   Specimen Description BLOOD LEFT HAND  Final   Special Requests IN PEDIATRIC BOTTLE 4 CC  Final   Culture   Final    NO GROWTH < 24  HOURS Performed at [facility]    Report Status PENDING  Incomplete     Studies: Ct Abdomen Pelvis Wo Contrast  10/11/2015  CLINICAL DATA:  Acute onset of low grade fever. Nausea, vomiting, fatigue and leukocytosis. Red blood cells and white blood cells in the urine. Assess for hydronephrosis. Initial  encounter. EXAM: CT ABDOMEN AND PELVIS WITHOUT CONTRAST TECHNIQUE: Multidetector CT imaging of the abdomen and pelvis was performed following the standard protocol without IV contrast. COMPARISON:  CT of the abdomen and pelvis from 06/02/2015 FINDINGS: Minimal bibasilar atelectasis is noted. The liver is unremarkable in appearance. The spleen is somewhat bulky, but remains normal in length. The gallbladder is within normal limits. The pancreas and adrenal glands are unremarkable. An 8 mm stone is noted at the upper pole of the right kidney. Mild nonspecific perinephric stranding is noted bilaterally. There is no evidence of hydronephrosis. No obstructing ureteral stones are seen. No perinephric stranding is appreciated. No free fluid is identified. The small bowel is unremarkable in appearance. The stomach is within normal limits. No acute vascular abnormalities are seen. The appendix is not definitely seen; there is no evidence for appendicitis. The colon is grossly unremarkable in appearance. The bladder is relatively decompressed. Diffuse bladder wall thickening and surrounding soft tissue inflammation raise concern for cystitis. An underlying suprapubic catheter is noted in expected position. The prostate remains normal in size. No inguinal lymphadenopathy is seen. No acute osseous abnormalities are identified. IMPRESSION: 1. No evidence of hydronephrosis. 2. Diffuse bladder wall thickening and surrounding soft tissue inflammation raises concern for cystitis. 3. 8 mm nonobstructing stone at the upper pole of the right kidney. Electronically Signed   By: Roanna Raider M.D.   On: 10/11/2015 01:06    Dg Chest 2 View  10/10/2015  CLINICAL DATA:  Nausea, vomiting and fever. Altered mental status. Symptoms today. Initial encounter. EXAM: CHEST  2 VIEW COMPARISON:  Single view of the chest 06/02/2015. PA and lateral chest 07/10/2014. FINDINGS: The lungs are clear. Heart size is normal. No pneumothorax or pleural effusion. No focal bony abnormality. IMPRESSION: No acute disease. Electronically Signed   By: Drusilla Kanner M.D.   On: 10/10/2015 18:03    Scheduled Meds:  Scheduled Meds: . baclofen  40 mg Oral TID  . dantrolene  100 mg Oral TID  . darifenacin  7.5 mg Oral Daily  . enoxaparin (LOVENOX) injection  40 mg Subcutaneous QHS  . feeding supplement (ENSURE ENLIVE)  237 mL Oral BID BM  . imipenem-cilastatin  500 mg Intravenous Q8H  . levothyroxine  125 mcg Oral Daily  . lisinopril  10 mg Oral Daily  . mirabegron ER  50 mg Oral Daily  . pantoprazole  40 mg Oral Daily  . perphenazine  2 mg Oral BID  . scopolamine  1 patch Transdermal Q72H  . sertraline  200 mg Oral QHS  . tiZANidine  8 mg Oral TID  . traZODone  200 mg Oral QHS  . [START ON 10/13/2015] Vitamin D (Ergocalciferol)  50,000 Units Oral Q7 days   Continuous Infusions: . sodium chloride 75 mL/hr at 10/11/15 0606    Time spent on care of this patient: 35 min   Talha Iser, MD 10/11/2015, 2:37 PM  LOS: 1 day   [facility] Office  [phone] Pager - Text Page per [email or web address] If 7PM-7AM, please contact night-coverage [email or web address]

## 2015-10-12 ENCOUNTER — Inpatient Hospital Stay (HOSPITAL_COMMUNITY)
Admit: 2015-10-12 | Discharge: 2015-10-12 | Disposition: A | Discharge status: Home / Self Care | Payer: Medicare Other | Attending: Internal Medicine | Admitting: Internal Medicine

## 2015-10-12 ENCOUNTER — Inpatient Hospital Stay (HOSPITAL_COMMUNITY): Payer: Medicare Other

## 2015-10-12 DIAGNOSIS — R569 Unspecified convulsions: Secondary | ICD-10-CM

## 2015-10-12 LAB — GLUCOSE, CAPILLARY
Glucose-Capillary: 115 mg/dL — ABNORMAL HIGH (ref 65–99)
Glucose-Capillary: 76 mg/dL (ref 65–99)
Glucose-Capillary: 85 mg/dL (ref 65–99)
Glucose-Capillary: 90 mg/dL (ref 65–99)

## 2015-10-12 MED ORDER — LEVOFLOXACIN IN D5W 750 MG/150ML IV SOLN
750.0000 mg | Freq: Every day | INTRAVENOUS | Status: DC
  Administered 2015-10-12 – 2015-10-13 (×2): 750 mg via INTRAVENOUS
  Filled 2015-10-12 (×3): qty 150

## 2015-10-12 MED ORDER — CIPROFLOXACIN IN D5W 400 MG/200ML IV SOLN: 400.0000 mg | Freq: Two times a day (BID) | INTRAVENOUS | Status: DC

## 2015-10-12 MED ORDER — LEVETIRACETAM 500 MG PO TABS
500.0000 mg | ORAL_TABLET | Freq: Two times a day (BID) | ORAL | Status: DC
  Administered 2015-10-12 (×2): 500 mg via ORAL
  Filled 2015-10-12 (×2): qty 1

## 2015-10-12 NOTE — Progress Notes (Signed)
Shift event note:  Notified by RN that pt noted to have seizure like activity during bed-bath. She described a sudden period of unresponsiveness while being turned that was associated w/ (R) facial ticking and eye blinking while (R) side of face appeared to be drawn to the (R). There was also tonic/clonic type movements of the (R) arm. 02 sats dropped briefly to 85% during this time but w/ rapid return to 100% when 02 applied via [place]. This last approx 1 min. After seizure activity ended pt fell into a deep sleep w/ snoring. When he began to come around he was quite confused (though this has also been intermittent baseline for pt). VSS through-out. At bedside pt noted resting in NAD. He awakens easily to voice and initially somewhat confused but after a few minutes was able to answer simple questions and denied ever having seizures before. Skin w/d, denies pain. VSS and pt's 02 sats are back to 97% on r/a. Pt is afebrile. Pt w/ h/o MS associated quadriparesis so unable to participate in neuro exam. AM labs unremarkable. Pt stable at the time of my departure. Assessment/Plan: 1. New onset seizure: Etiology unclear though likely associated w/ MS disease process. Will obtain stat Ct head w/o cm. May need neurology consult this am. Will continue to monitor closely on Med-Surg.  Leanne Chang, Np-C [facility] Pager [phone]

## 2015-10-12 NOTE — Progress Notes (Signed)
Pharmacy Antibiotic Follow-up Note  Samuel [name] is a 55 y.o. year-old male admitted on 10/10/2015.  The patient is currently on day 2 of antibiotics for possible UTI, day 1 vancomycin for GPC bacteremia 1/2 BC(now identified as CoNS). Patient developed seizure this morning, concern may be secondary to imipenem/cilastatin.  Antibiotics changed to ciprofloxacin   Assessment/Plan:  Patient on ciprofloxacin and zanaflex, combination contraindicated - TRH changed to levofloxacin.  Informed [facility] that patient tolerated cephalosporins  Blood cx with CoNS likely contaminant,  Info relayed to [facility], wishes to continue vancomycin at this time.  May need trough in next 48h d/t MS and concern SCR may over estimate renal function.  Temp (24hrs), Avg:98.1 F (36.7 C), Min:97.7 F (36.5 C), Max:98.7 F (37.1 C)   Recent Labs Lab 10/10/15 1817 10/11/15 0439  WBC 11.7* 11.7*    Recent Labs Lab 10/10/15 1817 10/11/15 0439  CREATININE 0.58* 0.39*   Estimated Creatinine Clearance: 87 mL/min (by C-G formula based on Cr of 0.39).    Allergies  Allergen Reactions  . Penicillins Other (See Comments)    Unknown childhoood reaction (takes amoxicillian with reaction) Has tolerated ceftriaxone, cefepime, and Primaxin during inpatient admissions    Antimicrobials this admission: 2/7 >> cipro >> 2/7 2/8 >> primaxin >>  2/9 2/8 >> Vanc >> 2/9 >> Cipro >> 2/9 (stopped d/t DDI with zanflex) 2/9 >> levofloxacin >>  Levels/dose changes this admission:  Microbiology results: 2/7 blood:  CoNS 1/2 2/7 urine: mult species, final  Thank you for allowing pharmacy to be a part of this patient's care.  Juliette Alcide, PharmD, BCPS.   Pager: [phone] 10/12/2015 10:29 AM

## 2015-10-12 NOTE — Progress Notes (Addendum)
Pt began having seizure activity at approximately 0536 lasting 1 minute. Pt began to have facial ticking favoring the right side, eyes blinking rapidly and mouth opening and closing drawn to the right side with movement of right arm. Pt postictal after seizure activity and began a deep sleep with snoring. Pt not responsive to name. BP 166/87, 100%-4L, P-91. K. Schorr,NP notified, currently at bedside. Pt currently arousable and responds to name.

## 2015-10-12 NOTE — Procedures (Signed)
ELECTROENCEPHALOGRAM REPORT   Patient: Samuel [name]      Room #: 1594 Age: 56 y.o.        Sex: male Referring Physician: Dr Butler Denmark Report Date:  10/12/2015        Interpreting Physician: Omelia Blackwater  History: Samuel [name] is an 55 y.o. male with new onset seizure  Medications:  I have reviewed the patient's current medications.  Conditions of Recording:  This is a 16 channel EEG carried out with the patient in the drowsy state.  Description:  The waking background activity consists of a low voltage, symmetrical, fairly well organized, 8-9 Hz alpha activity, seen from the parieto-occipital and posterior temporal regions.  Low voltage fast activity, poorly organized, is seen anteriorly and is at times superimposed on more posterior regions. No focal slowing or epileptiform activity noted.   Hyperventilation was not performed. Intermittent photic stimulation was not performed.  IMPRESSION: Normal electroencephalogram. There are no focal lateralizing or epileptiform features.   Elspeth Cho, DO [facility] [phone]  If 7pm- 7am, please page neurology on call as listed in AMION. 10/12/2015, 11:45 AM

## 2015-10-12 NOTE — Progress Notes (Signed)
Offsite EEG completed at [facility]. Results pending. 

## 2015-10-12 NOTE — Progress Notes (Signed)
[facility] Progress Note   Samuel [name]  ZOX:[id number]  DOB: 06/06/1961  DOA: 10/10/2015 PCP: [name], MD  Brief narrative: Samuel [name] is a 55 y.o. male with multiple sclerosis and resultant quadriparesis and a suprapubic catheter who presents to the hospital with fever (very low grade at 99), nausea and vomiting. He is admitted for the suspicion of a urinary tract infection was started on IV antibiotics   Subjective: Events of last night noted- he was noted to have a seizure- he is alert and oriented when I examined him this AM and mainly complained of "gurgling" in her abdomen. Tolerating clear liquids. Per RN, he later appeared confused and was repeating one sentence again and again for 45 min. When I went to examine him he appeared to have returned to his baseline.   Assessment/Plan: Principal Problem:   Nausea with vomiting - Suspected to be coming from urinary tract infection but may also be related to a gastroenteritis -Abdomen is nontender and CT scan of the abdomen pelvis is unrevealing -Continue symptomatic treatment with antiemetics  - advance diet to solids today  Active Problems:  Seizure - likely due to Primaxin - CT head negative, EEG negative - have spoken with Neuro and have been recommended to start Keppra for now- Dr Hosie Poisson recommends for him to f/u as outpt to determine if and when it can be weakned    UTI (lower urinary tract infection) - He is suspected to have a urinary tract infection due to the presentation of the low-grade fever -According to his mother, the last time he was confused was when he had a urinary tract infection and therefore this may be his symptom which points towards a UTI - previously grew out Citrobacter which was pan-sensitive -Due to the fact that he has a chronic indwelling Foley, his UA will always be positive - unfortunately, his urine culture is unhelpful as it is growing multiple bacterial species -Was given  IV ciprofloxacin in the ER initially but then switched to Primaxin by admitting doctor- however, due to his seizure this AM, I have stopped Primaxin and placed him on Levaquin  Acute encephalopathy -Suspected to be secondary to underlying infection    Essential hypertension - We will continue lisinopril     Multiple sclerosis (HCC) - Bedbound with chronic Foley catheter-continue Mirabegron, Vesicare, baclofen and Zanaflex  Antibiotics: Anti-infectives    Start     Dose/Rate Route Frequency Ordered Stop   10/12/15 1030  levofloxacin (LEVAQUIN) IVPB 750 mg     750 mg 100 mL/hr over 90 Minutes Intravenous Daily 10/12/15 0938     10/12/15 1000  vancomycin (VANCOCIN) IVPB 750 mg/150 ml premix     750 mg 150 mL/hr over 60 Minutes Intravenous Every 12 hours 10/11/15 2033     10/12/15 0800  ciprofloxacin (CIPRO) IVPB 400 mg  Status:  Discontinued     400 mg 200 mL/hr over 60 Minutes Intravenous Every 12 hours 10/12/15 0740 10/12/15 0938   10/11/15 2100  vancomycin (VANCOCIN) IVPB 1000 mg/200 mL premix     1,000 mg 200 mL/hr over 60 Minutes Intravenous  Once 10/11/15 2033 10/11/15 2249   10/10/15 2359  imipenem-cilastatin (PRIMAXIN) 500 mg in sodium chloride 0.9 % 100 mL IVPB  Status:  Discontinued     500 mg 200 mL/hr over 30 Minutes Intravenous Every 8 hours 10/10/15 2319 10/12/15 0737   10/10/15 1930  ciprofloxacin (CIPRO) IVPB 400 mg     400 mg 200  mL/hr over 60 Minutes Intravenous  Once 10/10/15 1918 10/10/15 2110     Code Status:     Code Status Orders        Start     Ordered   10/10/15 2306  Full code   Continuous     10/10/15 2306    Code Status History    Date Active Date Inactive Code Status Order ID Comments User Context   06/02/2015  9:04 AM 06/05/2015 10:57 PM Full Code [id number]  Richarda Overlie, MD ED   07/10/2014  1:22 PM 07/14/2014  8:50 PM Full Code [id number]  Osvaldo Shipper, MD Inpatient   03/02/2014 10:36 PM 03/03/2014  5:50 PM Full Code [id number]  Haydee Monica,  MD Inpatient     Family Communication: mother at bedside Disposition Plan: home in 1-2 days based on when he is able to eat  DVT prophylaxis: Lovenox Consultants:  Procedures:     Objective: Filed Weights   10/10/15 2030  Weight: 58.3 kg (128 lb 8.5 oz)    Intake/Output Summary (Last 24 hours) at 10/12/15 1409 Last data filed at 10/12/15 1100  Gross per 24 hour  Intake   2195 ml  Output   2650 ml  Net   -455 ml     Vitals Filed Vitals:   10/12/15 0612 10/12/15 0736 10/12/15 0900 10/12/15 1151  BP:  161/82 150/92 178/98  Pulse:  100 91 102  Temp:   98.1 F (36.7 C) 98.4 F (36.9 C)  TempSrc:   Oral Oral  Resp:   18 20  Height:      Weight:      SpO2: 97%  97% 96%    Exam:  General:  Pt is alert, not in acute distress  HEENT: No icterus, No thrush, oral mucosa moist  Cardiovascular: regular rate and rhythm, S1/S2 No murmur  Respiratory: clear to auscultation bilaterally   Abdomen: Soft, +Bowel sounds, non tender, non distended, no guarding  MSK: No cyanosis or clubbing- no pedal edema   Data Reviewed: Basic Metabolic Panel:  Recent Labs Lab 10/10/15 1817 10/11/15 0439  NA 139 135  K 4.0 3.6  CL 101 102  CO2 26 23  GLUCOSE 101* 125*  BUN 11 10  CREATININE 0.58* 0.39*  CALCIUM 10.0 9.0   Liver Function Tests:  Recent Labs Lab 10/10/15 1817 10/11/15 0439  AST 22 20  ALT 11* 9*  ALKPHOS 115 101  BILITOT 0.9 0.6  PROT 7.9 6.5  ALBUMIN 4.7 4.0   No results for input(s): LIPASE, AMYLASE in the last 168 hours. No results for input(s): AMMONIA in the last 168 hours. CBC:  Recent Labs Lab 10/10/15 1817 10/11/15 0439  WBC 11.7* 11.7*  NEUTROABS 10.0* 9.5*  HGB 15.7 14.2  HCT 46.1 43.4  MCV 87.5 90.0  PLT 215 206   Cardiac Enzymes: No results for input(s): CKTOTAL, CKMB, CKMBINDEX, TROPONINI in the last 168 hours. BNP (last 3 results) No results for input(s): BNP in the last 8760 hours.  ProBNP (last 3 results) No results for  input(s): PROBNP in the last 8760 hours.  CBG:  Recent Labs Lab 10/11/15 2354 10/12/15 0503 10/12/15 1143  GLUCAP 85 76 90    Recent Results (from the past 240 hour(s))  Urine culture     Status: None   Collection Time: 10/10/15  5:29 PM  Result Value Ref Range Status   Specimen Description URINE, SUPRAPUBIC  Final   Special Requests NONE  Final  Culture   Final    MULTIPLE SPECIES PRESENT, SUGGEST RECOLLECTION Performed at [facility]    Report Status 10/11/2015 FINAL  Final  Culture, blood (routine x 2)     Status: None (Preliminary result)   Collection Time: 10/10/15  6:10 PM  Result Value Ref Range Status   Specimen Description BLOOD LEFT WRIST  Final   Special Requests BOTTLES DRAWN AEROBIC AND ANAEROBIC 5 CC EACH  Final   Culture  Setup Time   Final    GRAM POSITIVE COCCI IN CLUSTERS AEROBIC BOTTLE ONLY CRITICAL RESULT CALLED TO, READ BACK BY AND VERIFIED WITH: S ARMSTRONG RN 1929 10/11/15 A BROWNING    Culture   Final    STAPHYLOCOCCUS SPECIES (COAGULASE NEGATIVE) THE SIGNIFICANCE OF ISOLATING THIS ORGANISM FROM A SINGLE SET OF BLOOD CULTURES WHEN MULTIPLE SETS ARE DRAWN IS UNCERTAIN. PLEASE NOTIFY THE MICROBIOLOGY DEPARTMENT WITHIN ONE WEEK IF SPECIATION AND SENSITIVITIES ARE REQUIRED. Performed at [facility]    Report Status PENDING  Incomplete  Culture, blood (routine x 2)     Status: None (Preliminary result)   Collection Time: 10/10/15  6:30 PM  Result Value Ref Range Status   Specimen Description BLOOD LEFT HAND  Final   Special Requests IN PEDIATRIC BOTTLE 4 CC  Final   Culture   Final    NO GROWTH < 24 HOURS Performed at [facility]    Report Status PENDING  Incomplete     Studies: Ct Abdomen Pelvis Wo Contrast  10/11/2015  CLINICAL DATA:  Acute onset of low grade fever. Nausea, vomiting, fatigue and leukocytosis. Red blood cells and white blood cells in the urine. Assess for hydronephrosis. Initial encounter. EXAM: CT ABDOMEN  AND PELVIS WITHOUT CONTRAST TECHNIQUE: Multidetector CT imaging of the abdomen and pelvis was performed following the standard protocol without IV contrast. COMPARISON:  CT of the abdomen and pelvis from 06/02/2015 FINDINGS: Minimal bibasilar atelectasis is noted. The liver is unremarkable in appearance. The spleen is somewhat bulky, but remains normal in length. The gallbladder is within normal limits. The pancreas and adrenal glands are unremarkable. An 8 mm stone is noted at the upper pole of the right kidney. Mild nonspecific perinephric stranding is noted bilaterally. There is no evidence of hydronephrosis. No obstructing ureteral stones are seen. No perinephric stranding is appreciated. No free fluid is identified. The small bowel is unremarkable in appearance. The stomach is within normal limits. No acute vascular abnormalities are seen. The appendix is not definitely seen; there is no evidence for appendicitis. The colon is grossly unremarkable in appearance. The bladder is relatively decompressed. Diffuse bladder wall thickening and surrounding soft tissue inflammation raise concern for cystitis. An underlying suprapubic catheter is noted in expected position. The prostate remains normal in size. No inguinal lymphadenopathy is seen. No acute osseous abnormalities are identified. IMPRESSION: 1. No evidence of hydronephrosis. 2. Diffuse bladder wall thickening and surrounding soft tissue inflammation raises concern for cystitis. 3. 8 mm nonobstructing stone at the upper pole of the right kidney. Electronically Signed   By: Roanna Raider M.D.   On: 10/11/2015 01:06   Dg Chest 2 View  10/10/2015  CLINICAL DATA:  Nausea, vomiting and fever. Altered mental status. Symptoms today. Initial encounter. EXAM: CHEST  2 VIEW COMPARISON:  Single view of the chest 06/02/2015. PA and lateral chest 07/10/2014. FINDINGS: The lungs are clear. Heart size is normal. No pneumothorax or pleural effusion. No focal bony  abnormality. IMPRESSION: No acute disease. Electronically  Signed   By: Drusilla Kanner M.D.   On: 10/10/2015 18:03   Ct Head Wo Contrast  10/12/2015  CLINICAL DATA:  Sudden onset of seizure this morning. Multiple sclerosis. EXAM: CT HEAD WITHOUT CONTRAST TECHNIQUE: Contiguous axial images were obtained from the base of the skull through the vertex without intravenous contrast. COMPARISON:  Brain MR 12/02/2013.  Report not available. FINDINGS: Sinuses/Soft tissues: Soft tissue calcification about the right side of the scalp. Minimal fluid in posterior left mastoid air cells. Intracranial: Age advanced cerebral and cerebellar atrophy. No mass lesion, hemorrhage, hydrocephalus, acute infarct, intra-axial, or extra-axial fluid collection. IMPRESSION: 1.  No acute intracranial abnormality. 2. Age advanced cerebral and cerebellar atrophy. 3. Trace fluid in left mastoid air cells. Electronically Signed   By: Jeronimo Greaves M.D.   On: 10/12/2015 07:10    Scheduled Meds:  Scheduled Meds: . baclofen  40 mg Oral TID  . dantrolene  100 mg Oral TID  . darifenacin  7.5 mg Oral Daily  . enoxaparin (LOVENOX) injection  40 mg Subcutaneous QHS  . feeding supplement (ENSURE ENLIVE)  237 mL Oral BID BM  . levETIRAcetam  500 mg Oral BID  . levofloxacin (LEVAQUIN) IV  750 mg Intravenous Daily  . levothyroxine  125 mcg Oral Daily  . lisinopril  10 mg Oral Daily  . mirabegron ER  50 mg Oral Daily  . pantoprazole  40 mg Oral Daily  . perphenazine  2 mg Oral BID  . scopolamine  1 patch Transdermal Q72H  . sertraline  200 mg Oral QHS  . tiZANidine  8 mg Oral TID  . traZODone  200 mg Oral QHS  . vancomycin  750 mg Intravenous Q12H  . [START ON 10/13/2015] Vitamin D (Ergocalciferol)  50,000 Units Oral Q7 days   Continuous Infusions:    Time spent on care of this patient: 35 min   Adeoluwa Silvers, MD 10/12/2015, 2:09 PM  LOS: 2 days   [facility] Office  [phone] Pager - Text Page per [email or web address] If  7PM-7AM, please contact night-coverage [email or web address]

## 2015-10-13 LAB — TSH: TSH: 34.636 u[IU]/mL — ABNORMAL HIGH (ref 0.350–4.500)

## 2015-10-13 LAB — AMMONIA: Ammonia: 33 umol/L (ref 9–35)

## 2015-10-13 LAB — CBC
HCT: 42.6 % (ref 39.0–52.0)
Hemoglobin: 14 g/dL (ref 13.0–17.0)
MCH: 29.3 pg (ref 26.0–34.0)
MCHC: 32.9 g/dL (ref 30.0–36.0)
MCV: 89.1 fL (ref 78.0–100.0)
Platelets: 206 10*3/uL (ref 150–400)
RBC: 4.78 MIL/uL (ref 4.22–5.81)
RDW: 14.1 % (ref 11.5–15.5)
WBC: 11 10*3/uL — ABNORMAL HIGH (ref 4.0–10.5)

## 2015-10-13 LAB — BASIC METABOLIC PANEL
Anion gap: 10 (ref 5–15)
BUN: 7 mg/dL (ref 6–20)
CO2: 25 mmol/L (ref 22–32)
Calcium: 9 mg/dL (ref 8.9–10.3)
Chloride: 104 mmol/L (ref 101–111)
Creatinine, Ser: 0.42 mg/dL — ABNORMAL LOW (ref 0.61–1.24)
GFR calc Af Amer: >60 mL/min (ref >60)
GFR calc non Af Amer: >60 mL/min (ref >60)
Glucose, Bld: 96 mg/dL (ref 65–99)
Potassium: 3.2 mmol/L — ABNORMAL LOW (ref 3.5–5.1)
Sodium: 139 mmol/L (ref 135–145)

## 2015-10-13 LAB — CULTURE, BLOOD (ROUTINE X 2)

## 2015-10-13 LAB — GLUCOSE, CAPILLARY
Glucose-Capillary: 138 mg/dL — ABNORMAL HIGH (ref 65–99)
Glucose-Capillary: 77 mg/dL (ref 65–99)

## 2015-10-13 MED ORDER — LEVOFLOXACIN 250 MG PO TABS: 250.0000 mg | ORAL_TABLET | Freq: Every day | ORAL | Status: DC

## 2015-10-13 MED ORDER — LEVETIRACETAM 500 MG PO TABS: 500.0000 mg | ORAL_TABLET | Freq: Two times a day (BID) | ORAL | Status: DC

## 2015-10-13 NOTE — Discharge Summary (Signed)
Physician Discharge Summary  DMON LITAKER VAP:[id number] DOB: March 28, 1961 DOA: 10/10/2015  PCP: [name], MD  Admit date: 10/10/2015 Discharge date: 10/13/2015  Time spent: 50 minutes     Discharge Condition: stable    Discharge Diagnoses:  Principal Problem:   Seizure (HCC) Active Problems:   Multiple sclerosis (HCC)   UTI (lower urinary tract infection)   Essential hypertension   Nausea & vomiting   History of present illness:  Samuel [name] is a 55 y.o. male with multiple sclerosis and resultant quadriparesis and a suprapubic catheter who presents to the hospital with fever (very low grade at 99), nausea and vomiting. He is admitted for the suspicion of a urinary tract infection was started on IV antibiotics  Hospital Course:  Principal Problem:  Nausea with vomiting - Suspected to be coming from urinary tract infection but may also be related to a gastroenteritis -Abdomen is nontender and CT scan of the abdomen pelvis is unrevealing - improved with antiemetics- no longer vomiting- tolerating solid food.  Active Problems:  Seizure - likely due to Primaxin - CT head negative, EEG negative - have spoken with Neuro and have been recommended to start Keppra for now- Dr Hosie Poisson recommends for him to f/u as outpt to determine if and when it can be weaned   UTI (lower urinary tract infection) - He is suspected to have a urinary tract infection due to the presentation of the low-grade fever -According to his mother, the last time he was confused was when he had a urinary tract infection and therefore this may be his symptom which points towards a UTI - previously grew out Citrobacter which was pan-sensitive -Due to the fact that he has a chronic indwelling Foley, his UA will always be positive - unfortunately, his urine culture is unhelpful as it is growing multiple bacterial species -Was given IV ciprofloxacin in the ER initially but then switched to Primaxin by  admitting doctor- however, due to his seizure, I have stopped Primaxin and placed him on Levaquin  Acute encephalopathy -Suspected to be secondary to underlying infection   Essential hypertension - We will continue lisinopril   Multiple sclerosis (HCC) - Bedbound with chronic Foley catheter-continue Mirabegron, Vesicare, baclofen and Zanaflex   Procedures:  EEG  Consultations:  Neuro   Discharge Exam: Filed Weights   10/10/15 2030  Weight: 58.3 kg (128 lb 8.5 oz)   Filed Vitals:   10/13/15 0446 10/13/15 1247  BP: 136/81 150/101  Pulse: 101 94  Temp: 97.8 F (36.6 C) 98.1 F (36.7 C)  Resp: 18 18    General: AAO x 3, no distress Cardiovascular: RRR, no murmurs  Respiratory: clear to auscultation bilaterally GI: soft, non-tender, non-distended, bowel sound positive  Discharge Instructions You were cared for by a hospitalist during your hospital stay. If you have any questions about your discharge medications or the care you received while you were in the hospital after you are discharged, you can call the unit and asked to speak with the hospitalist on call if the hospitalist that took care of you is not available. Once you are discharged, your primary care physician will handle any further medical issues. Please note that NO REFILLS for any discharge medications will be authorized once you are discharged, as it is imperative that you return to your primary care physician (or establish a relationship with a primary care physician if you do not have one) for your aftercare needs so that they can reassess your need  for medications and monitor your lab values.      Discharge Instructions    Diet - low sodium heart healthy    Complete by:  As directed      Increase activity slowly    Complete by:  As directed             Medication List    STOP taking these medications        ibuprofen 200 MG tablet  Commonly known as:  ADVIL,MOTRIN     traMADol 50 MG  tablet  Commonly known as:  ULTRAM      TAKE these medications        acetic acid 0.25 % irrigation  Irrigate with as directed daily. Instill 30ml and clamp tube for 30 minutes then drain.     ALPRAZolam 1 MG tablet  Commonly known as:  XANAX  2 tablets at night     baclofen 20 MG tablet  Commonly known as:  LIORESAL  TAKE TWO TABLETS BY MOUTH THREE TIMES DAILY     BOTOX IJ  Inject 300 Units as directed every 3 (three) months. Administered at MD office     dantrolene 100 MG capsule  Commonly known as:  DANTRIUM  TAKE ONE CAPSULE BY MOUTH THREE TIMES DAILY     HYDROcodone-acetaminophen 5-325 MG tablet  Commonly known as:  NORCO/VICODIN  Take 1-2 tablets by mouth every 6 (six) hours as needed.     levETIRAcetam 500 MG tablet  Commonly known as:  KEPPRA  Take 1 tablet (500 mg total) by mouth 2 (two) times daily.     levofloxacin 250 MG tablet  Commonly known as:  LEVAQUIN  Take 1 tablet (250 mg total) by mouth daily.     levothyroxine 125 MCG tablet  Commonly known as:  SYNTHROID, LEVOTHROID  Take 125 mcg by mouth daily.     lisinopril-hydrochlorothiazide 10-12.5 MG tablet  Commonly known as:  PRINZIDE,ZESTORETIC  Take 0.5 tablets by mouth every morning.     MYRBETRIQ 50 MG Tb24 tablet  Generic drug:  mirabegron ER  Take 50 mg by mouth daily.     omeprazole 20 MG capsule  Commonly known as:  PRILOSEC  Take 20 mg by mouth every morning.     perphenazine 2 MG tablet  Commonly known as:  TRILAFON  TAKE ONE TABLET BY MOUTH TWICE DAILY     scopolamine 1 MG/3DAYS  Commonly known as:  TRANSDERM-SCOP (1.5 MG)  Place 1 patch (1.5 mg total) onto the skin every 3 (three) days.     sertraline 100 MG tablet  Commonly known as:  ZOLOFT  Take 200 mg by mouth at bedtime.     solifenacin 10 MG tablet  Commonly known as:  VESICARE  Take 10 mg by mouth daily.     tiZANidine 4 MG capsule  Commonly known as:  ZANAFLEX  Take 8 mg by mouth 3 (three) times daily.      tiZANidine 4 MG tablet  Commonly known as:  ZANAFLEX  TAKE ONE TABLET BY MOUTH EVERY 4 HOURS AS NEEDED     traZODone 100 MG tablet  Commonly known as:  DESYREL  Take 2 tablets (200 mg total) by mouth at bedtime.     Vitamin D (Ergocalciferol) 50000 units Caps capsule  Commonly known as:  DRISDOL  Take 50,000 Units by mouth every 7 (seven) days. Friday only       Allergies  Allergen Reactions  . Penicillins Other (See Comments)  Unknown childhoood reaction (takes amoxicillian with reaction) Has tolerated ceftriaxone, cefepime, and Primaxin during inpatient admissions   Follow-up Information    Follow up with [facility] In 1 month.   Contact information:   [address]     [address] [place] [address] [phone]       The results of significant diagnostics from this hospitalization (including imaging, microbiology, ancillary and laboratory) are listed below for reference.    Significant Diagnostic Studies: Ct Abdomen Pelvis Wo Contrast  10/11/2015  CLINICAL DATA:  Acute onset of low grade fever. Nausea, vomiting, fatigue and leukocytosis. Red blood cells and white blood cells in the urine. Assess for hydronephrosis. Initial encounter. EXAM: CT ABDOMEN AND PELVIS WITHOUT CONTRAST TECHNIQUE: Multidetector CT imaging of the abdomen and pelvis was performed following the standard protocol without IV contrast. COMPARISON:  CT of the abdomen and pelvis from 06/02/2015 FINDINGS: Minimal bibasilar atelectasis is noted. The liver is unremarkable in appearance. The spleen is somewhat bulky, but remains normal in length. The gallbladder is within normal limits. The pancreas and adrenal glands are unremarkable. An 8 mm stone is noted at the upper pole of the right kidney. Mild nonspecific perinephric stranding is noted bilaterally. There is no evidence of hydronephrosis. No obstructing ureteral stones are seen. No perinephric stranding is appreciated.  No free fluid is identified. The small bowel is unremarkable in appearance. The stomach is within normal limits. No acute vascular abnormalities are seen. The appendix is not definitely seen; there is no evidence for appendicitis. The colon is grossly unremarkable in appearance. The bladder is relatively decompressed. Diffuse bladder wall thickening and surrounding soft tissue inflammation raise concern for cystitis. An underlying suprapubic catheter is noted in expected position. The prostate remains normal in size. No inguinal lymphadenopathy is seen. No acute osseous abnormalities are identified. IMPRESSION: 1. No evidence of hydronephrosis. 2. Diffuse bladder wall thickening and surrounding soft tissue inflammation raises concern for cystitis. 3. 8 mm nonobstructing stone at the upper pole of the right kidney. Electronically Signed   By: Roanna Raider M.D.   On: 10/11/2015 01:06   Dg Chest 2 View  10/10/2015  CLINICAL DATA:  Nausea, vomiting and fever. Altered mental status. Symptoms today. Initial encounter. EXAM: CHEST  2 VIEW COMPARISON:  Single view of the chest 06/02/2015. PA and lateral chest 07/10/2014. FINDINGS: The lungs are clear. Heart size is normal. No pneumothorax or pleural effusion. No focal bony abnormality. IMPRESSION: No acute disease. Electronically Signed   By: Drusilla Kanner M.D.   On: 10/10/2015 18:03   Ct Head Wo Contrast  10/12/2015  CLINICAL DATA:  Sudden onset of seizure this morning. Multiple sclerosis. EXAM: CT HEAD WITHOUT CONTRAST TECHNIQUE: Contiguous axial images were obtained from the base of the skull through the vertex without intravenous contrast. COMPARISON:  Brain MR 12/02/2013.  Report not available. FINDINGS: Sinuses/Soft tissues: Soft tissue calcification about the right side of the scalp. Minimal fluid in posterior left mastoid air cells. Intracranial: Age advanced cerebral and cerebellar atrophy. No mass lesion, hemorrhage, hydrocephalus, acute infarct,  intra-axial, or extra-axial fluid collection. IMPRESSION: 1.  No acute intracranial abnormality. 2. Age advanced cerebral and cerebellar atrophy. 3. Trace fluid in left mastoid air cells. Electronically Signed   By: Jeronimo Greaves M.D.   On: 10/12/2015 07:10    Microbiology: Recent Results (from the past 240 hour(s))  Urine culture     Status: None   Collection Time: 10/10/15  5:29 PM  Result Value  Ref Range Status   Specimen Description URINE, SUPRAPUBIC  Final   Special Requests NONE  Final   Culture   Final    MULTIPLE SPECIES PRESENT, SUGGEST RECOLLECTION Performed at [facility]    Report Status 10/11/2015 FINAL  Final  Culture, blood (routine x 2)     Status: None   Collection Time: 10/10/15  6:10 PM  Result Value Ref Range Status   Specimen Description BLOOD LEFT WRIST  Final   Special Requests BOTTLES DRAWN AEROBIC AND ANAEROBIC 5 CC EACH  Final   Culture  Setup Time   Final    GRAM POSITIVE COCCI IN CLUSTERS AEROBIC BOTTLE ONLY CRITICAL RESULT CALLED TO, READ BACK BY AND VERIFIED WITH: S ARMSTRONG RN 1929 10/11/15 A BROWNING    Culture   Final    STAPHYLOCOCCUS SPECIES (COAGULASE NEGATIVE) THE SIGNIFICANCE OF ISOLATING THIS ORGANISM FROM A SINGLE SET OF BLOOD CULTURES WHEN MULTIPLE SETS ARE DRAWN IS UNCERTAIN. PLEASE NOTIFY THE MICROBIOLOGY DEPARTMENT WITHIN ONE WEEK IF SPECIATION AND SENSITIVITIES ARE REQUIRED. Performed at [facility]    Report Status 10/13/2015 FINAL  Final  Culture, blood (routine x 2)     Status: None (Preliminary result)   Collection Time: 10/10/15  6:30 PM  Result Value Ref Range Status   Specimen Description BLOOD LEFT HAND  Final   Special Requests IN PEDIATRIC BOTTLE 4 CC  Final   Culture   Final    NO GROWTH 3 DAYS Performed at [facility]    Report Status PENDING  Incomplete     Labs: Basic Metabolic Panel:  Recent Labs Lab 10/10/15 1817 10/11/15 0439 10/13/15 0904  NA 139 135 139  K 4.0 3.6 3.2*  CL 101  102 104  CO2 26 23 25   GLUCOSE 101* 125* 96  BUN 11 10 7   CREATININE 0.58* 0.39* 0.42*  CALCIUM 10.0 9.0 9.0   Liver Function Tests:  Recent Labs Lab 10/10/15 1817 10/11/15 0439  AST 22 20  ALT 11* 9*  ALKPHOS 115 101  BILITOT 0.9 0.6  PROT 7.9 6.5  ALBUMIN 4.7 4.0   No results for input(s): LIPASE, AMYLASE in the last 168 hours. No results for input(s): AMMONIA in the last 168 hours. CBC:  Recent Labs Lab 10/10/15 1817 10/11/15 0439 10/13/15 0904  WBC 11.7* 11.7* 11.0*  NEUTROABS 10.0* 9.5*  --   HGB 15.7 14.2 14.0  HCT 46.1 43.4 42.6  MCV 87.5 90.0 89.1  PLT 215 206 206   Cardiac Enzymes: No results for input(s): CKTOTAL, CKMB, CKMBINDEX, TROPONINI in the last 168 hours. BNP: BNP (last 3 results) No results for input(s): BNP in the last 8760 hours.  ProBNP (last 3 results) No results for input(s): PROBNP in the last 8760 hours.  CBG:  Recent Labs Lab 10/12/15 0503 10/12/15 1143 10/12/15 1819 10/13/15 0029 10/13/15 0611  GLUCAP 76 90 115* 138* 77       SignedCalvert Cantor, MD [facility] 10/13/2015, 3:36 PM

## 2015-10-13 NOTE — Progress Notes (Signed)
Pt discharged home via [facility] in stable condition.  Discharge instructions given to patient and mother (caregiver) Caregiver verbalized understanding. Scripts sent to pharmacy of choice.

## 2015-10-13 NOTE — Care Management Note (Signed)
Case Management Note  Patient Details  Name: Samuel [name] MRN: [id number] Date of Birth: [birth year]  Subjective/Objective:          55 yo admitted with Nausea and vomiting. Hx of MS          Action/Plan: Pt is bed bound and lives with mother. He has a caregiver as well.  Expected Discharge Date:                  Expected Discharge Plan:  Home/Self Care  In-House Referral:     Discharge planning Services  CM Consult  Post Acute Care Choice:    Choice offered to:     DME Arranged:    DME Agency:     HH Arranged:    HH Agency:     Status of Service:  In process, will continue to follow  Medicare Important Message Given:  Yes Date Medicare IM Given:    Medicare IM give by:    Date Additional Medicare IM Given:    Additional Medicare Important Message give by:     If discussed at Long Length of Stay Meetings, dates discussed:    Additional Comments: Chart reviewed and no CM needs identified or communicated at this time. CM will continue to follow. Sandford Craze RN,BSN,NCM [phone] Samuel Bill, RN 10/13/2015, 2:58 PM

## 2015-10-13 NOTE — Consult Note (Signed)
NEURO HOSPITALIST CONSULT NOTE   Requestig physician: Dr. Butler Denmark   Reason for Consult: confusion  HPI:                                                                                                                                          Samuel [name] is an 55 y.o. male with history of multiple sclerosis with quadriparesis presents via because of fatigue fevers and nausea vomiting. Patient has been having these symptoms for last 2-3 days. Denies any abdominal pain or diarrhea. Patient has suprapubic catheter. Chest x-ray does not show anything acute. UA shows features consistent with UTI. Patient has been admitted for further management of UTI with nausea and vomiting. Over night had a seizure. EEG was negative. CT head showed no acute abnormality.  patient states he was confused yesterday but now has resolved. Mother at bedside agrees but is not worried. She notes since the antibiotic has been changed he is much better.   PAtietn currently being treated for UTI and WBC is still elevated.   Past Medical History  Diagnosis Date  . Hypertension   . GERD (gastroesophageal reflux disease)   . Hypothyroidism   . Vitamin D deficiency   . Chronic back pain   . Gait disorder   . 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 Multiple sclerosis (HCC) NEUROLOGIST-- [name]    dx 1993, JC virus negative 2/14---  CURRENTLY RECEIVING TYSABRI IV TREATMENT  . Pneumonia     dx  03-03-2014--  admitted for iv and oral antibiotics  . Urinary retention   . Neurogenic bladder   . Foley catheter in place   . Incontinence of urine   . Hyperlipidemia   . Bladder calculi   . Shortness of breath     secondary to fatique  . Chronic fatigue   . Neurogenic bowel     intermittant constipation and diarrhea  . Yeast infection     genital area    Past Surgical History  Procedure Laterality Date  . Appendectomy  age 45  . Nasal septum surgery  1996  . Negative sleep study  1996  .  Transthoracic echocardiogram  02-07-2004    MILD LVH/  EF 55-65%  . Insertion of suprapubic catheter N/A 03/21/2014    Procedure: INSERTION OF SUPRAPUBIC CATHETER;  Surgeon: [name], MD;  Location: [facility];  Service: Urology;  Laterality: N/A;  . Cystoscopy N/A 03/21/2014    Procedure: CYSTOSCOPY TREATMENT OF BLADDER CALCULI;  Surgeon: [name], MD;  Location: [facility];  Service: Urology;  Laterality: N/A;    Family History  Problem Relation Age of Onset  . Stroke Father   . Cirrhosis Brother   . Hypothyroidism Sister  Social History:  reports that he quit smoking about 30 years ago. His smoking use included Cigarettes. He has a 12 pack-year smoking history. He has never used smokeless tobacco. He reports that he does not drink alcohol or use illicit drugs.  Allergies  Allergen Reactions  . Penicillins Other (See Comments)    Unknown childhoood reaction (takes amoxicillian with reaction) Has tolerated ceftriaxone, cefepime, and Primaxin during inpatient admissions    MEDICATIONS:                                                                                                                     Current Facility-Administered Medications  Medication Dose Route Frequency Provider Last Rate Last Dose  . acetaminophen (TYLENOL) tablet 650 mg  650 mg Oral Q6H PRN Eduard Clos, MD       Or  . acetaminophen (TYLENOL) suppository 650 mg  650 mg Rectal Q6H PRN Eduard Clos, MD      . ALPRAZolam Prudy Feeler) tablet 1 mg  1 mg Oral BID PRN Calvert Cantor, MD   1 mg at 10/11/15 2156  . baclofen (LIORESAL) tablet 40 mg  40 mg Oral TID Eduard Clos, MD   40 mg at 10/12/15 2140  . dantrolene (DANTRIUM) capsule 100 mg  100 mg Oral TID Eduard Clos, MD   100 mg at 10/12/15 2141  . darifenacin (ENABLEX) 24 hr tablet 7.5 mg  7.5 mg Oral Daily Eduard Clos, MD   7.5 mg at 10/12/15 1101  . enoxaparin (LOVENOX) injection 40 mg   40 mg Subcutaneous QHS Eduard Clos, MD   40 mg at 10/12/15 2139  . feeding supplement (ENSURE ENLIVE) (ENSURE ENLIVE) liquid 237 mL  237 mL Oral BID BM Tilda Franco, RD   237 mL at 10/12/15 1600  . hydrALAZINE (APRESOLINE) injection 10 mg  10 mg Intravenous Q4H PRN Eduard Clos, MD   10 mg at 10/12/15 2141  . HYDROcodone-acetaminophen (NORCO/VICODIN) 5-325 MG per tablet 1-2 tablet  1-2 tablet Oral Q6H PRN Eduard Clos, MD   2 tablet at 10/11/15 1445  . levETIRAcetam (KEPPRA) tablet 500 mg  500 mg Oral BID Calvert Cantor, MD   500 mg at 10/12/15 2141  . levofloxacin (LEVAQUIN) IVPB 750 mg  750 mg Intravenous Daily Calvert Cantor, MD   750 mg at 10/12/15 1059  . levothyroxine (SYNTHROID, LEVOTHROID) tablet 125 mcg  125 mcg Oral Daily Eduard Clos, MD   125 mcg at 10/12/15 1447  . lisinopril (PRINIVIL,ZESTRIL) tablet 10 mg  10 mg Oral Daily Eduard Clos, MD   10 mg at 10/12/15 1447  . mirabegron ER (MYRBETRIQ) tablet 50 mg  50 mg Oral Daily Eduard Clos, MD   50 mg at 10/12/15 1104  . ondansetron (ZOFRAN) tablet 4 mg  4 mg Oral Q6H PRN Eduard Clos, MD       Or  . ondansetron [facility]) injection 4 mg  4 mg Intravenous Q6H PRN Meryle Ready  Toniann Fail, MD      . pantoprazole (PROTONIX) EC tablet 40 mg  40 mg Oral Daily Eduard Clos, MD   40 mg at 10/12/15 1447  . perphenazine (TRILAFON) tablet 2 mg  2 mg Oral BID Eduard Clos, MD   2 mg at 10/12/15 2140  . scopolamine (TRANSDERM-SCOP) 1 MG/3DAYS 1.5 mg  1 patch Transdermal Q72H Eduard Clos, MD   1.5 mg at 10/11/15 1039  . sertraline (ZOLOFT) tablet 200 mg  200 mg Oral QHS Eduard Clos, MD   200 mg at 10/12/15 2140  . tiZANidine (ZANAFLEX) tablet 8 mg  8 mg Oral TID Eduard Clos, MD   8 mg at 10/12/15 2140  . traMADol (ULTRAM) tablet 50 mg  50 mg Oral Q6H PRN Eduard Clos, MD      . traZODone (DESYREL) tablet 200 mg  200 mg Oral QHS Eduard Clos, MD   200 mg at  10/12/15 2141  . Vitamin D (Ergocalciferol) (DRISDOL) capsule 50,000 Units  50,000 Units Oral Q7 days Eduard Clos, MD          ROS:                                                                                                                                       History obtained from the patient  General ROS: negative for - chills, fatigue, fever, night sweats, weight gain or weight loss Psychological ROS: negative for - behavioral disorder, hallucinations, memory difficulties, mood swings or suicidal ideation Ophthalmic ROS: negative for - blurry vision, double vision, eye pain or loss of vision ENT ROS: negative for - epistaxis, nasal discharge, oral lesions, sore throat, tinnitus or vertigo Allergy and Immunology ROS: negative for - hives or itchy/watery eyes Hematological and Lymphatic ROS: negative for - bleeding problems, bruising or swollen lymph nodes Endocrine ROS: negative for - galactorrhea, hair pattern changes, polydipsia/polyuria or temperature intolerance Respiratory ROS: negative for - cough, hemoptysis, shortness of breath or wheezing Cardiovascular ROS: negative for - chest pain, dyspnea on exertion, edema or irregular heartbeat Gastrointestinal ROS: negative for - abdominal pain, diarrhea, hematemesis, nausea/vomiting or stool incontinence Genito-Urinary ROS: negative for - dysuria, hematuria, incontinence or urinary frequency/urgency Musculoskeletal ROS: negative for - joint swelling or muscular weakness Neurological ROS: as noted in HPI Dermatological ROS: negative for rash and skin lesion changes   Blood pressure 136/81, pulse 101, temperature 97.8 F (36.6 C), temperature source Oral, resp. rate 18, height  (1.854 m), weight 58.3 kg (128 lb 8.5 oz), SpO2 98 %.   Neurologic Examination:  HEENT-  Normocephalic, no lesions, without obvious abnormality.   Normal external eye and conjunctiva.  Normal TM's bilaterally.  Normal auditory canals and external ears. Normal external nose, mucus membranes and septum.  Normal pharynx. Cardiovascular- S1, S2 normal, pulses palpable throughout   Lungs- chest clear, no wheezing, rales, normal symmetric air entry Abdomen- normal findings: bowel sounds normal Extremities- no edema Lymph-no adenopathy palpable Musculoskeletal-no joint tenderness, deformity or swelling Skin-warm and dry, no hyperpigmentation, vitiligo, or suspicious lesions  Neurological Examination Mental Status: Alert, oriented, thought content appropriate.  Speech fluent without evidence of aphasia.  Able to follow 3 step commands without difficulty. Cranial Nerves: II: Discs flat bilaterally; Visual fields grossly normal, pupils equal, round, reactive to light and accommodation III,IV, VI: ptosis not present, extra-ocular motions intact bilaterally V,VII: smile symmetric, facial light touch sensation normal bilaterally VIII: hearing normal bilaterally IX,X: uvula rises symmetrically XI: bilateral shoulder shrug XII: midline tongue extension Motor: Minimal movement of UE. There is flexion at the knees bilaterally. The patient has a flexion contracture of the right elbow, is able to straighten the left arm. Patient has 3/5 strength in grip bilaterally, he is unable to fully abduct either arm. Sensory: Pinprick and light touch intact throughout, bilaterally Deep Tendon Reflexes: symmetric throughout Plantars: mute       Lab Results: Basic Metabolic Panel:  Recent Labs Lab 10/10/15 1817 10/11/15 0439  NA 139 135  K 4.0 3.6  CL 101 102  CO2 26 23  GLUCOSE 101* 125*  BUN 11 10  CREATININE 0.58* 0.39*  CALCIUM 10.0 9.0    Liver Function Tests:  Recent Labs Lab 10/10/15 1817 10/11/15 0439  AST 22 20  ALT 11* 9*  ALKPHOS 115 101  BILITOT 0.9 0.6  PROT 7.9 6.5  ALBUMIN 4.7 4.0   No results for input(s): LIPASE,  AMYLASE in the last 168 hours. No results for input(s): AMMONIA in the last 168 hours.  CBC:  Recent Labs Lab 10/10/15 1817 10/11/15 0439  WBC 11.7* 11.7*  NEUTROABS 10.0* 9.5*  HGB 15.7 14.2  HCT 46.1 43.4  MCV 87.5 90.0  PLT 215 206    Cardiac Enzymes: No results for input(s): CKTOTAL, CKMB, CKMBINDEX, TROPONINI in the last 168 hours.  Lipid Panel: No results for input(s): CHOL, TRIG, HDL, CHOLHDL, VLDL, LDLCALC in the last 168 hours.  CBG:  Recent Labs Lab 10/11/15 2354 10/12/15 0503 10/12/15 1143 10/12/15 1819  GLUCAP 85 76 90 115*    Microbiology: Results for orders placed or performed during the hospital encounter of 10/10/15  Urine culture     Status: None   Collection Time: 10/10/15  5:29 PM  Result Value Ref Range Status   Specimen Description URINE, SUPRAPUBIC  Final   Special Requests NONE  Final   Culture   Final    MULTIPLE SPECIES PRESENT, SUGGEST RECOLLECTION Performed at [facility]    Report Status 10/11/2015 FINAL  Final  Culture, blood (routine x 2)     Status: None (Preliminary result)   Collection Time: 10/10/15  6:10 PM  Result Value Ref Range Status   Specimen Description BLOOD LEFT WRIST  Final   Special Requests BOTTLES DRAWN AEROBIC AND ANAEROBIC 5 CC EACH  Final   Culture  Setup Time   Final    GRAM POSITIVE COCCI IN CLUSTERS AEROBIC BOTTLE ONLY CRITICAL RESULT CALLED TO, READ BACK BY AND VERIFIED WITHLaqueta Jean RN [id number] 10/11/15 A BROWNING    Culture   Final  STAPHYLOCOCCUS SPECIES (COAGULASE NEGATIVE) THE SIGNIFICANCE OF ISOLATING THIS ORGANISM FROM A SINGLE SET OF BLOOD CULTURES WHEN MULTIPLE SETS ARE DRAWN IS UNCERTAIN. PLEASE NOTIFY THE MICROBIOLOGY DEPARTMENT WITHIN ONE WEEK IF SPECIATION AND SENSITIVITIES ARE REQUIRED. Performed at [facility]    Report Status PENDING  Incomplete  Culture, blood (routine x 2)     Status: None (Preliminary result)   Collection Time: 10/10/15  6:30 PM  Result Value Ref  Range Status   Specimen Description BLOOD LEFT HAND  Final   Special Requests IN PEDIATRIC BOTTLE 4 CC  Final   Culture   Final    NO GROWTH 2 DAYS Performed at [facility]    Report Status PENDING  Incomplete    Coagulation Studies: No results for input(s): LABPROT, INR in the last 72 hours.  Imaging: Ct Head Wo Contrast  10/12/2015  CLINICAL DATA:  Sudden onset of seizure this morning. Multiple sclerosis. EXAM: CT HEAD WITHOUT CONTRAST TECHNIQUE: Contiguous axial images were obtained from the base of the skull through the vertex without intravenous contrast. COMPARISON:  Brain MR 12/02/2013.  Report not available. FINDINGS: Sinuses/Soft tissues: Soft tissue calcification about the right side of the scalp. Minimal fluid in posterior left mastoid air cells. Intracranial: Age advanced cerebral and cerebellar atrophy. No mass lesion, hemorrhage, hydrocephalus, acute infarct, intra-axial, or extra-axial fluid collection. IMPRESSION: 1.  No acute intracranial abnormality. 2. Age advanced cerebral and cerebellar atrophy. 3. Trace fluid in left mastoid air cells. Electronically Signed   By: Jeronimo Greaves M.D.   On: 10/12/2015 07:10       Assessment and plan per attending neurologist  Felicie Morn PA-C [facility] [phone]  10/13/2015, 9:37 AM   Assessment/Plan:  55 YO male with confusion in setting of UTI and hospital stay. Confusion llikely related to UTI and SE from ABx.  Currently back to his baseline. Continues to have elevated WBC.   Recommend: 1) Treat underlying metabolic and infectious processes.  2) Follow up with out patient neurology at discharge.  3) Will follow up as needed

## 2015-10-13 NOTE — Care Management Important Message (Signed)
Important Message  Patient Details IM Letter given to Nora/Case Manager to present to Patient Name: LANDRUM DAFFRON MRN: [id number] Date of Birth: 04/19/1961   Medicare Important Message Given:  Yes    Haskell Flirt 10/13/2015, 10:47 AMImportant Message  Patient Details  Name: BRET KLOCKO MRN: [id number] Date of Birth: 1961-05-08   Medicare Important Message Given:  Yes    Haskell Flirt 10/13/2015, 10:47 AM

## 2015-10-15 LAB — CULTURE, BLOOD (ROUTINE X 2): Culture: NO GROWTH

## 2015-10-16 ENCOUNTER — Ambulatory Visit: Payer: Medicare Other | Admitting: Neurology

## 2015-10-17 ENCOUNTER — Telehealth: Payer: Self-pay | Admitting: Neurology

## 2015-10-17 NOTE — Telephone Encounter (Signed)
I called the caretaker. The patient was just in the hospital for a possible urinary tract infection, increased confusion. The patient was placed on Levaquin, he had a seizure on the medication. The oral medication increased confusion as well, this was stopped. The patient is on Keppra now, he is taking trazodone at night which works for sleep, he is not to take the clonazepam or Xanax if he does not needed for sleep. The hydrocodone is to be taken only if needed, not on a scheduled basis. He is also on perphenazine. The patient is having some problems with pseudobulbar affect. He is having a lot of anxiety.

## 2015-10-17 NOTE — Telephone Encounter (Signed)
Pt's sister [name] [phone] called regarding pt's meds.

## 2015-10-17 NOTE — Telephone Encounter (Signed)
I called and spoke to the patient's sister. She states her mother is confused about the conversation she had with Toniann Fail, RN earlier. Her mother stated that Toniann Fail advised the patient should take norco, ativan and klonopin around the clock. I advised that norco is as needed and ativan is to be taken at night. I do not see klonopin listed on the patient's chart. The patient is taking Trilafon according to the patient's sister. Her mother was also confused about talking to me. I explained that I had called because Toniann Fail felt like the patient may need to be seen sooner than one month after being in the hospital. The patient's sister confirmed that the patient cannot travel outside of the house anymore. She states it makes him too nauseous and causes him too much pain. She would like to know if there are any doctors who come to the home. I advised that I do not know of any but will check with Dr. Anne Hahn. She asked that Dr. Anne Hahn call her husband and the patient's primary caregiver, Baldo Ash [phone]).

## 2015-10-17 NOTE — Telephone Encounter (Signed)
Please see phone note 10/17/15.

## 2015-10-17 NOTE — Addendum Note (Signed)
Addended by: Stephanie Acre on: 10/17/2015 05:41 PM   Modules accepted: Orders, Medications

## 2015-10-17 NOTE — Telephone Encounter (Signed)
Home health nurse is calling stating that patient is scheduled to come back and see Dr. Anne Hahn on 02/06/2016 for a 6 mo f/u but he is still having problems and wants to see if patient needs to be scheduled sooner. Please call Iris Pert

## 2015-10-17 NOTE — Telephone Encounter (Signed)
I called and spoke to the home health nurse. She stated the patient is still very depressed. He has confusion and crying spells. He was apparently in the ED recently and was started on Keppra. He is apparently not taking his Trilafon. His mother told the home health nurse she didn't know anything about it. I called the patient's mother to schedule a sooner appointment. She states she doesn't know how she will get him here. I advised that there are transportation services available. She states he gets extremely nauseous when he travels out of the house. I asked if he takes nauseous medication prior to the trip if it helps. She states she does not know. She will talk this over with the patient and call me back to let me know when they are ready to schedule.

## 2015-10-21 ENCOUNTER — Other Ambulatory Visit: Payer: Self-pay | Admitting: Neurology

## 2015-10-25 ENCOUNTER — Telehealth: Payer: Self-pay | Admitting: Neurology

## 2015-10-25 ENCOUNTER — Telehealth: Payer: Self-pay

## 2015-10-25 MED ORDER — HYDROCODONE-ACETAMINOPHEN 5-325 MG PO TABS: 1.0000 | ORAL_TABLET | Freq: Four times a day (QID) | ORAL | Status: DC | PRN

## 2015-10-25 NOTE — Telephone Encounter (Signed)
I will refill the hydrocodone. 

## 2015-10-25 NOTE — Telephone Encounter (Signed)
Rx ready for pick up. 

## 2015-10-25 NOTE — Telephone Encounter (Signed)
Carl Power's, pts caregiver called to request refill on HYDROcodone-acetaminophen (NORCO/VICODIN) 5-325 MG tablet.

## 2015-10-28 ENCOUNTER — Other Ambulatory Visit: Payer: Self-pay | Admitting: Neurology

## 2015-11-07 ENCOUNTER — Telehealth: Payer: Self-pay | Admitting: Neurology

## 2015-11-07 MED ORDER — ALPRAZOLAM 1 MG PO TABS: ORAL_TABLET | ORAL | Status: DC

## 2015-11-07 NOTE — Telephone Encounter (Signed)
I called the caretaker. The patient is having more anxiety. He has been on clonazepam previously, taking xanax prn. I will stop the clonazepam and start xanax 1 mg three times a day.

## 2015-11-07 NOTE — Telephone Encounter (Signed)
Baldo Ash Powers/Caregiver [phone] called regarding Clonazepam for anxiety, states patient has been on this medication a long time and has built up a tolerance, medication no longer seems to help, wonders if Dr. Anne Hahn would consider taking off of this medication and switch to another medication. States patient has suprapubic catheter, when he has a  Lot of anxiety he has a lot of bladder spasms and causes urine to leak from penis vs. catheter.

## 2015-11-15 ENCOUNTER — Telehealth: Payer: Self-pay | Admitting: Neurology

## 2015-11-15 MED ORDER — TRAZODONE HCL 300 MG PO TABS: 300.0000 mg | ORAL_TABLET | Freq: Every day | ORAL | Status: DC

## 2015-11-15 NOTE — Telephone Encounter (Signed)
Caregiver Dyke Maes called said he talked with [name] after hours over the past few weeks regarding pt not sleeping with trazadone. He sts he was instructed to increase to 2 tabs at bedtime and 3 tabs if needed. He has increased pt to 3 tabs at bedtime and needs new RX with direction and qty change. Please send to [facility]. Please notify him when rx has been sent. Thank you

## 2015-11-15 NOTE — Telephone Encounter (Signed)
I will call in a 300 mg trazodone tablet.

## 2015-11-15 NOTE — Telephone Encounter (Signed)
I called and spoke to [facility], care giver for pt.   He increased the trazadone  to 3 tabs po qhs.  He is taking the xanax  (up to 3 x daily if needed) for anxiety.  He is not on clonazepam.  He has run out of trazadone since prescription written for 2 tabs daily.  Needs new prescription if ok.

## 2015-11-22 NOTE — Telephone Encounter (Signed)
error 

## 2015-11-28 ENCOUNTER — Telehealth: Payer: Self-pay | Admitting: *Deleted

## 2015-11-28 NOTE — Telephone Encounter (Signed)
Patient life insurance   form on Samuel [name] desk.

## 2015-11-29 DIAGNOSIS — Z0289 Encounter for other administrative examinations: Secondary | ICD-10-CM

## 2015-11-30 NOTE — Telephone Encounter (Signed)
Form completed and signed.  To MR. 

## 2015-12-01 ENCOUNTER — Other Ambulatory Visit: Payer: Self-pay | Admitting: Neurology

## 2015-12-01 ENCOUNTER — Telehealth: Payer: Self-pay | Admitting: Neurology

## 2015-12-01 MED ORDER — HYDROCODONE-ACETAMINOPHEN 5-325 MG PO TABS: 1.0000 | ORAL_TABLET | Freq: Four times a day (QID) | ORAL | Status: DC | PRN

## 2015-12-01 NOTE — Telephone Encounter (Signed)
Form faxed to [employer]  [phone].

## 2015-12-01 NOTE — Telephone Encounter (Signed)
Caregiver/Carl Powers called to request refill of HYDROcodone-acetaminophen (NORCO/VICODIN) 5-325 MG tablet

## 2015-12-01 NOTE — Telephone Encounter (Signed)
This pt was in ED 10-13-15 and discharge summary states that to discontinue.  Do you want to refill?

## 2015-12-01 NOTE — Telephone Encounter (Signed)
Called the patient to discuss the charges he received for the last botox injection. He stated that did not have the paper work and would have to call back at a later time or bring the papers up here. He stated that he was no longer receiving injection from Dr. Terrace Arabia because of the high cost so I informed him we could get him set up with the savings card. He stated that he would call me back.

## 2015-12-04 ENCOUNTER — Other Ambulatory Visit: Payer: Self-pay | Admitting: Neurology

## 2015-12-04 ENCOUNTER — Telehealth: Payer: Self-pay | Admitting: *Deleted

## 2015-12-04 ENCOUNTER — Telehealth: Payer: Self-pay | Admitting: Neurology

## 2015-12-04 NOTE — Telephone Encounter (Signed)
Called pt to relay prescriptions ready (tramadol, and hydrocodone).

## 2015-12-04 NOTE — Telephone Encounter (Signed)
I called and spoke to pt and let him know that tramadol and hydrocodone prescriptions ready for pick up.

## 2015-12-04 NOTE — Telephone Encounter (Signed)
Samuel [name], called to request medication refill HYDROcodone-acetaminophen (NORCO/VICODIN) 5-325 MG tablet. Thank you

## 2015-12-05 ENCOUNTER — Other Ambulatory Visit: Payer: Self-pay | Admitting: Neurology

## 2015-12-12 ENCOUNTER — Telehealth: Payer: Self-pay | Admitting: Neurology

## 2015-12-12 NOTE — Telephone Encounter (Signed)
Caregiver Dyke Maes called to advise, patient is interested in new medication for MS OCREVUS.

## 2015-12-12 NOTE — Telephone Encounter (Signed)
I called the patient. He does not to consider the Ocrevus. I will try to get a revisit set up for him at some point.

## 2015-12-13 ENCOUNTER — Telehealth: Payer: Self-pay | Admitting: Neurology

## 2015-12-13 NOTE — Telephone Encounter (Signed)
Spoke to patient. Sooner appt scheduled 01/02/16 @ 12:00. Pt verbalized understanding and appreciation for call.

## 2015-12-13 NOTE — Telephone Encounter (Signed)
Patient's sister is calling regarding her brother's Botox not being covered by insurance. She can be reached at [phone] Option 0.

## 2015-12-21 NOTE — Telephone Encounter (Signed)
Spoke with patients sister. She is going to fax the bill over to me.

## 2015-12-21 NOTE — Telephone Encounter (Signed)
Returned sisters call and there was not an option for VM. Will call again later today.

## 2016-01-02 ENCOUNTER — Ambulatory Visit (INDEPENDENT_AMBULATORY_CARE_PROVIDER_SITE_OTHER): Payer: Medicare Other | Admitting: Neurology

## 2016-01-02 ENCOUNTER — Encounter: Payer: Self-pay | Admitting: Neurology

## 2016-01-02 VITALS — BP 108/72 | HR 78 | Ht 73.0 in

## 2016-01-02 DIAGNOSIS — G35D Multiple sclerosis, unspecified: Secondary | ICD-10-CM

## 2016-01-02 DIAGNOSIS — N319 Neuromuscular dysfunction of bladder, unspecified: Secondary | ICD-10-CM

## 2016-01-02 DIAGNOSIS — G8114 Spastic hemiplegia affecting left nondominant side: Secondary | ICD-10-CM

## 2016-01-02 DIAGNOSIS — Z5181 Encounter for therapeutic drug level monitoring: Secondary | ICD-10-CM

## 2016-01-02 DIAGNOSIS — R569 Unspecified convulsions: Secondary | ICD-10-CM | POA: Diagnosis not present

## 2016-01-02 DIAGNOSIS — G35 Multiple sclerosis: Secondary | ICD-10-CM

## 2016-01-02 MED ORDER — VENLAFAXINE HCL ER 75 MG PO CP24
ORAL_CAPSULE | ORAL | Status: DC
Start: 2016-01-02 — End: 2016-05-12

## 2016-01-02 NOTE — Patient Instructions (Signed)
   With the zoloft, begin 100 mg daily for 2 weeks, then stop. We will go on effexor while going off of the zoloft.

## 2016-01-02 NOTE — Progress Notes (Addendum)
Reason for visit: Multiple sclerosis  Samuel [name] is an 55 y.o. male  History of present illness:  Samuel [name] is a 55 year old right-handed white male with a history of multiple sclerosis with chronic progressive features. The patient has a quadriparesis, worse on the right than the left. The patient is living at his mothers home, he has a caretaker that helps care for him. The patient requires assistance for all activities of daily living. The patient has had a recent admission to the hospital in February 2017 with a urinary tract infection. The patient was placed on Primaxin, and he suffered a delirium state and a seizure. He was initially treated with Keppra, but this has been discontinued. The patient has not had any further seizures. The patient continues to report issues with depression on Zoloft and perphenazine. The patient has indwelling catheter. He has done well with his appetite, gaining weight, gaining strength. He still has some use of the left arm. He reports no new symptoms since last seen.  Past Medical History  Diagnosis Date  . Hypertension   . GERD (gastroesophageal reflux disease)   . Hypothyroidism   . Vitamin D deficiency   . Chronic back pain   . Gait disorder   . 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 Multiple sclerosis (HCC) NEUROLOGIST-- [name]    dx 1993, JC virus negative 2/14---  CURRENTLY RECEIVING TYSABRI IV TREATMENT  . Pneumonia     dx  03-03-2014--  admitted for iv and oral antibiotics  . Urinary retention   . Neurogenic bladder   . Foley catheter in place   . Incontinence of urine   . Hyperlipidemia   . Bladder calculi   . Shortness of breath     secondary to fatique  . Chronic fatigue   . Neurogenic bowel     intermittant constipation and diarrhea  . Yeast infection     genital area    Past Surgical History  Procedure Laterality Date  . Appendectomy  age 76  . Nasal septum surgery  1996  . Negative sleep study  1996  .  Transthoracic echocardiogram  02-07-2004    MILD LVH/  EF 55-65%  . Insertion of suprapubic catheter N/A 03/21/2014    Procedure: INSERTION OF SUPRAPUBIC CATHETER;  Surgeon: [name], MD;  [facility];  Service: Urology;  Laterality: N/A;  . Cystoscopy N/A 03/21/2014    Procedure: CYSTOSCOPY TREATMENT OF BLADDER CALCULI;  Surgeon: [name], MD;  Location: [facility];  Service: Urology;  Laterality: N/A;    Family History  Problem Relation Age of Onset  . Stroke Father   . Cirrhosis Brother   . Hypothyroidism Sister     Social history:  reports that he quit smoking about 30 years ago. His smoking use included Cigarettes. He has a 12 pack-year smoking history. He has never used smokeless tobacco. He reports that he does not drink alcohol or use illicit drugs.    Allergies  Allergen Reactions  . Penicillins Other (See Comments)    Unknown childhoood reaction (takes amoxicillian with reaction) Has tolerated ceftriaxone, cefepime, and Primaxin during inpatient admissions    Medications:  Prior to Admission medications   Medication Sig Start Date End Date Taking? Authorizing Provider  acetic acid 0.25 % irrigation Irrigate with as directed daily. Instill 72ml and clamp tube for 30 minutes then drain. 06/03/15  Yes [name], MD  ALPRAZolam Prudy Feeler) 1 MG tablet One  tablet three times a day 11/07/15  Yes York Spaniel, MD  baclofen (LIORESAL) 20 MG tablet TAKE TWO TABLETS BY MOUTH THREE TIMES DAILY 10/23/15  Yes York Spaniel, MD  dantrolene (DANTRIUM) 100 MG capsule TAKE ONE CAPSULE BY MOUTH THREE TIMES DAILY 10/30/15  Yes York Spaniel, MD  HYDROcodone-acetaminophen (NORCO/VICODIN) 5-325 MG tablet Take 1-2 tablets by mouth every 6 (six) hours as needed. 12/01/15  Yes York Spaniel, MD  levothyroxine (SYNTHROID, LEVOTHROID) 125 MCG tablet Take 125 mcg by mouth daily. 05/22/15  Yes Historical Provider, MD    lisinopril-hydrochlorothiazide (PRINZIDE,ZESTORETIC) 10-12.5 MG tablet Take 0.5 tablets by mouth every morning. 09/05/15  Yes Historical Provider, MD  mirabegron ER (MYRBETRIQ) 50 MG TB24 tablet Take 50 mg by mouth daily.   Yes Historical Provider, MD  omeprazole (PRILOSEC) 20 MG capsule Take 20 mg by mouth every morning.    Yes Historical Provider, MD  OnabotulinumtoxinA (BOTOX IJ) Inject 300 Units as directed every 3 (three) months. Administered at MD office   Yes Historical Provider, MD  perphenazine (TRILAFON) 2 MG tablet TAKE ONE TABLET BY MOUTH TWICE DAILY Patient taking differently: Take 1 tablet by mouth twice daily. 08/14/15  Yes York Spaniel, MD  scopolamine (TRANSDERM-SCOP, 1.5 MG,) 1 MG/3DAYS Place 1 patch (1.5 mg total) onto the skin every 3 (three) days. 05/18/15  Yes York Spaniel, MD  sertraline (ZOLOFT) 100 MG tablet Take 200 mg by mouth at bedtime.    Yes Historical Provider, MD  solifenacin (VESICARE) 10 MG tablet Take 10 mg by mouth daily.   Yes Historical Provider, MD  tiZANidine (ZANAFLEX) 4 MG capsule Take 8 mg by mouth 3 (three) times daily.   Yes Historical Provider, MD  tiZANidine (ZANAFLEX) 4 MG tablet TAKE ONE TABLET BY MOUTH EVERY 4 HOURS AS NEEDED 08/14/15  Yes York Spaniel, MD  traMADol (ULTRAM) 50 MG tablet TAKE ONE TABLET BY MOUTH EVERY 6 HOURS AS NEEDED 12/01/15  Yes York Spaniel, MD  trazodone (DESYREL) 300 MG tablet Take 1 tablet (300 mg total) by mouth at bedtime. 11/15/15  Yes York Spaniel, MD  Vitamin D, Ergocalciferol, (DRISDOL) 50000 UNITS CAPS Take 50,000 Units by mouth every 7 (seven) days. Friday only   Yes Historical Provider, MD  levETIRAcetam (KEPPRA) 500 MG tablet Take 1 tablet (500 mg total) by mouth 2 (two) times daily. Patient not taking: Reported on 01/02/2016 10/13/15   Calvert Cantor, MD    ROS:  Out of a complete 14 system review of symptoms, the patient complains only of the following symptoms, and all other reviewed systems are  negative.  Excessive sweating Runny nose Double vision, loss of vision Shortness of breath Heart murmur Cold intolerance, heat intolerance Constipation, diarrhea, nausea, vomiting Insomnia, frequent waking Blood in the urine, penile discharge, testicular pain Joint pain, back pain, muscle cramps Moles, itching Memory loss, dizziness, numbness, seizures, weakness, tremors Agitation, confusion, decreased concentration, depression, anxiety, hyperactivity  Blood pressure 108/72, pulse 78, height  (1.854 m).  Physical Exam  General: The patient is alert and cooperative at the time of the examination.  Skin: No significant peripheral edema is noted.   Neurologic Exam  Mental status: The patient is alert and oriented x 3 at the time of the examination. The patient has apparent normal recent and remote memory, with an apparently normal attention span and concentration ability.   Cranial nerves: Facial symmetry is present. Speech is normal, no aphasia or dysarthria is noted. Extraocular  movements are notable for bilateral INO, and some restriction of superior gaze. Visual fields are full. Pupils are equal, round, and reactive to light. Discs are flat bilaterally.  Motor: The patient has essentially no voluntary movement with both legs, the right arm is in flexion, he does have 4/5 strength with flexion and extension of the elbow, but there is a flexion contracture, the patient cannot get the arm straight more than 90. The patient has minimal grip with the right hand. Throughout the left arm, there is 4+/5 strength.  Sensory examination: Soft touch sensation is symmetric on the face, arms, and legs.  Coordination: The patient has the ability to perform finger-nose-finger with the left arm with some difficulty, unable to perform with the right arm or with the legs.  Gait and station: The patient cannot be ambulated, he is wheelchair-bound.  Reflexes: Deep tendon reflexes are  symmetric, but are depressed.   Assessment/Plan:  1. Multiple sclerosis, PPMS  2. Spastic quadriparesis  3. Gait disturbance  4. Neurogenic bladder  5. Seizure on Primaxin  The patient has desired to start Ocrevus which has been released for chronic progressive and relapsing remitting multiple sclerosis. I will get his power of attorney to sign a form and initiate the process of getting the medication. The patient will have blood work today. Given the history of depression, we will take him off of Zoloft, and start Effexor. He will remain on the perphenazine. He will follow-up in 4 months.  Marlan Palau MD 01/02/2016 8:36 PM  [facility] [address] [place], [place] [address]  Phone [phone] Fax [phone]

## 2016-01-03 ENCOUNTER — Telehealth: Payer: Self-pay

## 2016-01-03 LAB — COMPREHENSIVE METABOLIC PANEL
ALT: 11 IU/L (ref 0–44)
AST: 16 IU/L (ref 0–40)
Albumin/Globulin Ratio: 2.4 — ABNORMAL HIGH (ref 1.2–2.2)
Albumin: 4.8 g/dL (ref 3.5–5.5)
Alkaline Phosphatase: 106 IU/L (ref 39–117)
BUN/Creatinine Ratio: 32 — ABNORMAL HIGH (ref 9–20)
BUN: 14 mg/dL (ref 6–24)
Bilirubin Total: 0.2 mg/dL (ref 0.0–1.2)
CO2: 23 mmol/L (ref 18–29)
Calcium: 9.8 mg/dL (ref 8.7–10.2)
Chloride: 94 mmol/L — ABNORMAL LOW (ref 96–106)
Creatinine, Ser: 0.44 mg/dL — ABNORMAL LOW (ref 0.76–1.27)
GFR calc Af Amer: 149 mL/min/{1.73_m2} (ref >59)
GFR calc non Af Amer: 129 mL/min/{1.73_m2} (ref >59)
Globulin, Total: 2 g/dL (ref 1.5–4.5)
Glucose: 101 mg/dL — ABNORMAL HIGH (ref 65–99)
Potassium: 3.7 mmol/L (ref 3.5–5.2)
Sodium: 134 mmol/L (ref 134–144)
Total Protein: 6.8 g/dL (ref 6.0–8.5)

## 2016-01-03 LAB — CBC WITH DIFFERENTIAL/PLATELET
Basophils Absolute: 0 10*3/uL (ref 0.0–0.2)
Basos: 0 %
EOS (ABSOLUTE): 0.2 10*3/uL (ref 0.0–0.4)
Eos: 2 %
Hematocrit: 43.9 % (ref 37.5–51.0)
Hemoglobin: 14.6 g/dL (ref 12.6–17.7)
Immature Grans (Abs): 0.1 10*3/uL (ref 0.0–0.1)
Immature Granulocytes: 1 %
Lymphocytes Absolute: 2.1 10*3/uL (ref 0.7–3.1)
Lymphs: 22 %
MCH: 28.7 pg (ref 26.6–33.0)
MCHC: 33.3 g/dL (ref 31.5–35.7)
MCV: 86 fL (ref 79–97)
Monocytes Absolute: 0.7 10*3/uL (ref 0.1–0.9)
Monocytes: 7 %
Neutrophils Absolute: 6.7 10*3/uL (ref 1.4–7.0)
Neutrophils: 68 %
Platelets: 220 10*3/uL (ref 150–379)
RBC: 5.09 x10E6/uL (ref 4.14–5.80)
RDW: 14.2 % (ref 12.3–15.4)
WBC: 9.8 10*3/uL (ref 3.4–10.8)

## 2016-01-03 NOTE — Telephone Encounter (Signed)
Received Ocrevus pt auth/signed POA forms. Faxed to [facility] F# [phone].

## 2016-01-03 NOTE — Telephone Encounter (Signed)
I called and left a message. The patient had a lump some pale from disability, may need to include 2 or 3 years back on tax returns. In the long run, this should not be a significant impediment to getting patient assistance if he needs it.

## 2016-01-03 NOTE — Telephone Encounter (Signed)
-----   Message from York Spaniel, MD sent at 01/03/2016  7:36 AM EDT -----  The blood work results are unremarkable, with exception of a slightly low chloride level, not likely clinically significant. Please call the patient. ----- Message -----    From: [facility] Lab Results In Interface    Sent: 01/03/2016   5:40 AM      To: York Spaniel, MD

## 2016-01-03 NOTE — Telephone Encounter (Signed)
Pt's sister called about the forms for medication. Pt's income for last year was a lump sum payment from pt's disability company. She has pt's tax returns. The lump sum was greater than his actual yearly income. Please call and advise Work [phone] option 0 . Or, [phone] Cell

## 2016-01-03 NOTE — Telephone Encounter (Addendum)
Called pt w/ unremarkable lab results. Verbalized understanding and appreciation for call. Voiced concern re: cost of new med Ocrevus and would like more information on cost/co-pay. Will continue to look into this for pt and update him as able.

## 2016-01-10 ENCOUNTER — Telehealth: Payer: Self-pay | Admitting: Neurology

## 2016-01-10 NOTE — Telephone Encounter (Addendum)
Patient is calling and states Dr. Anne Hahn wanted him to have an IV infusion of the new MS drug Ocrevus and he was waiting to hear what his copay will be. Please call.

## 2016-01-10 NOTE — Telephone Encounter (Signed)
Returned pt TC. Let him know that start form and required information was faxed into Ocrevus last week. Once that is processed/approved, he should receive a phone call. Also spoke w/ Inetta Fermo RN w/ [facility] who will need to do pre-cert for insurance approval/out of pocket cost. Verbalized understanding and appreciation for call. Also says that his sister brought EOB for Botox injections and hasn't heard anything back on that yet.

## 2016-01-11 ENCOUNTER — Telehealth: Payer: Self-pay | Admitting: Neurology

## 2016-01-11 DIAGNOSIS — G35 Multiple sclerosis: Secondary | ICD-10-CM

## 2016-01-11 DIAGNOSIS — Z5181 Encounter for therapeutic drug level monitoring: Secondary | ICD-10-CM

## 2016-01-11 NOTE — Telephone Encounter (Signed)
I called the patient. Further blood work is required for the Ocrevus infusion to include hepatitis B, hepatitis C screen, TB screen. I've for these orders in. The patient will come and get the blood work done.

## 2016-01-11 NOTE — Telephone Encounter (Signed)
Called and spoke with the patient. I informed him that the money he was having to pay was his copay on the medication. The [facility] told me that they had told the patient this before hand and he agreed to the payment. She says they did not make him aware of payment. I will check to see if he is eligible for B/B, I think medicare will pick up the remaining piece of copay.

## 2016-01-23 ENCOUNTER — Other Ambulatory Visit (INDEPENDENT_AMBULATORY_CARE_PROVIDER_SITE_OTHER): Payer: Self-pay

## 2016-01-23 DIAGNOSIS — G35 Multiple sclerosis: Secondary | ICD-10-CM

## 2016-01-23 DIAGNOSIS — Z5181 Encounter for therapeutic drug level monitoring: Secondary | ICD-10-CM

## 2016-01-23 DIAGNOSIS — Z0289 Encounter for other administrative examinations: Secondary | ICD-10-CM

## 2016-01-24 LAB — HEPATITIS B SURFACE ANTIGEN: Hepatitis B Surface Ag: NEGATIVE

## 2016-01-24 LAB — HEPATITIS B CORE ANTIBODY, TOTAL: Hep B Core Total Ab: NEGATIVE

## 2016-01-24 LAB — HEPATITIS C ANTIBODY: Hep C Virus Ab: <0.1 s/co ratio (ref 0.0–0.9)

## 2016-01-25 ENCOUNTER — Telehealth: Payer: Self-pay | Admitting: Neurology

## 2016-01-25 DIAGNOSIS — G8250 Quadriplegia, unspecified: Secondary | ICD-10-CM

## 2016-01-25 DIAGNOSIS — G35 Multiple sclerosis: Secondary | ICD-10-CM

## 2016-01-25 NOTE — Telephone Encounter (Signed)
Rx for bed extension mailed to pt.

## 2016-01-25 NOTE — Telephone Encounter (Signed)
Returned call and spoke to pt's sister. Advised her that Samuel [name] is patient assistance. Also requested that rx for bed extension be mailed to pt. Address verified and placed in mail as requested.

## 2016-01-25 NOTE — Telephone Encounter (Signed)
Pt's sister called said he has been moved in with his mother. The [employer] lift at his house needs to be returned to the company but there is not a sticker or any type of identification to show where it came from. Would RN know. Operator looked thru chart, looks like order for [facility] lift is 02/28/13. Just FYI for Victorino Dike so you wouldn't have to look at all the tele notes. Pt's is 6'2 and his feet ride on the end of the bed. He is wanting to know if he could get an extension or something to make the bed longer??

## 2016-01-25 NOTE — Telephone Encounter (Signed)
I wrote a prescription for the left in June 2014, I'm not sure what company was called to deliver the equipment. Okay to get an extension for the bed.

## 2016-01-25 NOTE — Telephone Encounter (Signed)
Pt's sister called said she has paperwork from [place] and wants to make sure it is for [employer] before she puts all his info on it. She said the form is very generic.

## 2016-01-26 ENCOUNTER — Telehealth: Payer: Self-pay

## 2016-01-26 LAB — QUANTIFERON IN TUBE
QFT TB AG MINUS NIL VALUE: <0 IU/mL
QUANTIFERON MITOGEN VALUE: >10 IU/mL
QUANTIFERON TB AG VALUE: 0.06 IU/mL
QUANTIFERON TB GOLD: NEGATIVE
Quantiferon Nil Value: 0.07 IU/mL

## 2016-01-26 LAB — QUANTIFERON TB GOLD ASSAY (BLOOD)

## 2016-01-26 NOTE — Telephone Encounter (Signed)
Called pt w/ unremarkable lab results. Spoke to Mrs. Lofaro. Verbalized understanding and appreciation for call.

## 2016-01-26 NOTE — Telephone Encounter (Signed)
-----   Message from York Spaniel, MD sent at 01/26/2016  7:27 AM EDT -----  The blood work results are unremarkable. Please call the patient.  ----- Message -----    From: [facility] Lab Results In Interface    Sent: 01/24/2016   5:41 AM      To: York Spaniel, MD

## 2016-02-02 ENCOUNTER — Other Ambulatory Visit: Payer: Self-pay | Admitting: Neurology

## 2016-02-02 NOTE — Telephone Encounter (Signed)
Rx printed, signed, faxed to pharmacy. 

## 2016-02-06 ENCOUNTER — Ambulatory Visit: Payer: Medicare Other | Admitting: Neurology

## 2016-02-06 ENCOUNTER — Telehealth: Payer: Self-pay | Admitting: *Deleted

## 2016-02-06 NOTE — Telephone Encounter (Signed)
error 

## 2016-03-24 ENCOUNTER — Other Ambulatory Visit: Payer: Self-pay | Admitting: Neurology

## 2016-04-11 ENCOUNTER — Other Ambulatory Visit: Payer: Self-pay | Admitting: Neurology

## 2016-04-18 ENCOUNTER — Other Ambulatory Visit: Payer: Self-pay | Admitting: Neurology

## 2016-04-19 ENCOUNTER — Telehealth: Payer: Self-pay | Admitting: Neurology

## 2016-04-19 NOTE — Telephone Encounter (Signed)
Patient requesting refill of tiZANidine (ZANAFLEX) 4 MG capsule Pharmacy: [facility] [address], [place] - [address] Please call Audry Riles [phone]

## 2016-04-19 NOTE — Telephone Encounter (Signed)
Prescription for the tizanidine was written.

## 2016-05-12 ENCOUNTER — Other Ambulatory Visit: Payer: Self-pay | Admitting: Neurology

## 2016-05-14 ENCOUNTER — Ambulatory Visit: Payer: Medicare Other | Admitting: Adult Health

## 2016-05-19 ENCOUNTER — Other Ambulatory Visit: Payer: Self-pay | Admitting: Neurology

## 2016-05-21 ENCOUNTER — Ambulatory Visit (INDEPENDENT_AMBULATORY_CARE_PROVIDER_SITE_OTHER): Payer: Medicare Other | Admitting: Adult Health

## 2016-05-21 ENCOUNTER — Encounter: Payer: Self-pay | Admitting: Adult Health

## 2016-05-21 VITALS — BP 107/74 | HR 86 | Ht 73.0 in

## 2016-05-21 DIAGNOSIS — G35 Multiple sclerosis: Secondary | ICD-10-CM | POA: Diagnosis not present

## 2016-05-21 DIAGNOSIS — G8250 Quadriplegia, unspecified: Secondary | ICD-10-CM | POA: Diagnosis not present

## 2016-05-21 MED ORDER — HYDROCODONE-ACETAMINOPHEN 5-325 MG PO TABS: 1.0000 | ORAL_TABLET | Freq: Four times a day (QID) | ORAL | 0 refills | Status: DC | PRN

## 2016-05-21 NOTE — Progress Notes (Signed)
PATIENT: Samuel [name] DOB: 1961-04-15  REASON FOR VISIT: follow up- muscle sclerosis HISTORY FROM: patient  HISTORY OF PRESENT ILLNESS: Samuel [name] is a 55 year old male with a history of multiple sclerosis with chronic progressive features. He returns today for follow-up. He was started on a ocrevus and reports that he's had the first 2 doses. He states that he has tolerated them well. He has noticed slightly more muscle contractions but does feel that baclofen and tizanidine is beneficial. In the past he has had Botox for spasticity in the right upper extremity. He has not had this recently. The patient has a suprapubic catheter. He does have a motorized wheelchair although he primarily stays in bed throughout the day. He states that he is a "four-person assist." Reports that he is more comfortable in the bed. He states that he does use hydrocodone for when the catheter has to be changed and sometimes with transfers. He reports that he has not had to have the pain medicine refilled since April because he does not use it consistently. Patient reports that he does have a hospital bed although the bed rails is not working and he also needs a bed extinction. He returns today for an evaluation.  HISTORY 01/02/16: Samuel [name] is a 55 year old right-handed white male with a history of multiple sclerosis with chronic progressive features. The patient has a quadriparesis, worse on the right than the left. The patient is living at his mothers home, he has a caretaker that helps care for him. The patient requires assistance for all activities of daily living. The patient has had a recent admission to the hospital in February 2017 with a urinary tract infection. The patient was placed on Primaxin, and he suffered a delirium state and a seizure. He was initially treated with Keppra, but this has been discontinued. The patient has not had any further seizures. The patient continues to report issues with depression  on Zoloft and perphenazine. The patient has indwelling catheter. He has done well with his appetite, gaining weight, gaining strength. He still has some use of the left arm. He reports no new symptoms since last seen.   REVIEW OF SYSTEMS: Out of a complete 14 system review of symptoms, the patient complains only of the following symptoms, and all other reviewed systems are negative.  See history of present illness  ALLERGIES: Allergies  Allergen Reactions  . Penicillins Other (See Comments)    Unknown childhoood reaction (takes amoxicillian with reaction) Has tolerated ceftriaxone, cefepime, and Primaxin during inpatient admissions    HOME MEDICATIONS: Outpatient Medications Prior to Visit  Medication Sig Dispense Refill  . acetic acid 0.25 % irrigation Irrigate with as directed daily. Instill 94ml and clamp tube for 30 minutes then drain. 500 mL 12  . ALPRAZolam (XANAX) 1 MG tablet TAKE ONE TABLET BY MOUTH THREE TIMES DAILY 90 tablet 0  . dantrolene (DANTRIUM) 100 MG capsule TAKE ONE CAPSULE BY MOUTH THREE TIMES DAILY 90 capsule 5  . HYDROcodone-acetaminophen (NORCO/VICODIN) 5-325 MG tablet Take 1-2 tablets by mouth every 6 (six) hours as needed. 60 tablet 0  . levETIRAcetam (KEPPRA) 500 MG tablet Take 1 tablet (500 mg total) by mouth 2 (two) times daily. 60 tablet 0  . levothyroxine (SYNTHROID, LEVOTHROID) 125 MCG tablet Take 125 mcg by mouth daily.    Marland Kitchen lisinopril-hydrochlorothiazide (PRINZIDE,ZESTORETIC) 10-12.5 MG tablet Take 0.5 tablets by mouth every morning.    . mirabegron ER (MYRBETRIQ) 50 MG TB24 tablet Take 50 mg by  mouth daily.    Marland Kitchen omeprazole (PRILOSEC) 20 MG capsule Take 20 mg by mouth every morning.     . OnabotulinumtoxinA (BOTOX IJ) Inject 300 Units as directed every 3 (three) months. Administered at MD office    . perphenazine (TRILAFON) 2 MG tablet TAKE ONE TABLET BY MOUTH TWICE DAILY 60 tablet 1  . scopolamine (TRANSDERM-SCOP, 1.5 MG,) 1 MG/3DAYS Place 1 patch (1.5  mg total) onto the skin every 3 (three) days. 10 patch 3  . solifenacin (VESICARE) 10 MG tablet Take 10 mg by mouth daily.    Marland Kitchen tiZANidine (ZANAFLEX) 4 MG capsule Take 8 mg by mouth 3 (three) times daily.    . traMADol (ULTRAM) 50 MG tablet TAKE ONE TABLET BY MOUTH EVERY 6 HOURS AS NEEDED 60 tablet 0  . trazodone (DESYREL) 300 MG tablet Take 1 tablet (300 mg total) by mouth at bedtime. 30 tablet 5  . venlafaxine XR (EFFEXOR-XR) 75 MG 24 hr capsule Take 2 capsules (150 mg total) by mouth daily with breakfast. 60 capsule 11  . Vitamin D, Ergocalciferol, (DRISDOL) 50000 UNITS CAPS Take 50,000 Units by mouth every 7 (seven) days. Friday only    . tiZANidine (ZANAFLEX) 4 MG tablet TAKE ONE TABLET BY MOUTH EVERY 4 HOURS AS NEEDED 180 tablet 1  . baclofen (LIORESAL) 20 MG tablet TAKE TWO TABLETS BY MOUTH THREE TIMES DAILY (Patient not taking: Reported on 05/21/2016) 540 tablet 3  . sertraline (ZOLOFT) 100 MG tablet Take 200 mg by mouth at bedtime.      Facility-Administered Medications Prior to Visit  Medication Dose Route Frequency Provider Last Rate Last Dose  . botulinum toxin Type A (BOTOX) injection 300 Units  300 Units Intramuscular Once Levert Feinstein, MD        PAST MEDICAL HISTORY: Past Medical History:  Diagnosis Date  . Bladder calculi   . Chronic back pain   . Chronic fatigue   . Foley catheter in place   . Gait disorder   . GERD (gastroesophageal reflux disease)   . Hyperlipidemia   . Hypertension   . Hypothyroidism   . Incontinence of urine   . Multiple sclerosis (HCC) NEUROLOGIST-- [name]   dx 1993, JC virus negative 2/14---  CURRENTLY RECEIVING TYSABRI IV TREATMENT  . Neurogenic bladder   . Neurogenic bowel    intermittant constipation and diarrhea  . Pneumonia    dx  03-03-2014--  admitted for iv and oral antibiotics  . Shortness of breath    secondary to fatique  . 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 Urinary retention   . Vitamin D deficiency   . Yeast infection     genital area    PAST SURGICAL HISTORY: Past Surgical History:  Procedure Laterality Date  . APPENDECTOMY  age 17  . CYSTOSCOPY N/A 03/21/2014   Procedure: CYSTOSCOPY TREATMENT OF BLADDER CALCULI;  Surgeon: [name], MD;  Location: [facility];  Service: Urology;  Laterality: N/A;  . INSERTION OF SUPRAPUBIC CATHETER N/A 03/21/2014   Procedure: INSERTION OF SUPRAPUBIC CATHETER;  Surgeon: [name], MD;  Location: [facility];  Service: Urology;  Laterality: N/A;  . NASAL SEPTUM SURGERY  1996  . NEGATIVE SLEEP STUDY  1996  . TRANSTHORACIC ECHOCARDIOGRAM  02-07-2004   MILD LVH/  EF 55-65%    FAMILY HISTORY: Family History  Problem Relation Age of Onset  . Stroke Father   . Cirrhosis Brother   . Hypothyroidism Sister  SOCIAL HISTORY: Social History   Social History  . Marital status: Divorced    Spouse name: N/A  . Number of children: 0  . Years of education: college   Occupational History  . [employer]     disability   Social History Main Topics  . Smoking status: Former Smoker    Packs/day: 1.50    Years: 8.00    Types: Cigarettes    Quit date: 06/17/1985  . Smokeless tobacco: Never Used  . Alcohol use No  . Drug use: No  . Sexual activity: Not on file   Other Topics Concern  . Not on file   Social History Narrative   Patient is right handed.   Patient does not drink caffeine.      [facility] Pulmonary/CC:   Patient currently lives alone. He has 3 separate caregivers who help care for him in his own home. His mother and sister also helped in the evening to get him ready for bed. He reports he does have a dog.      PHYSICAL EXAM  Vitals:   05/21/16 1119  BP: 107/74  Pulse: 86  Height: 6\' 1"  (1.854 m)   There is no height or weight on file to calculate BMI.  Generalized: Well developed, in no acute distress   Neurological examination  Mentation: Alert oriented to time, place, history taking. Follows all  commands speech and language fluent Cranial nerve II-XII: Pupils were equal round reactive to light. Extraocular movements were full, visual field were full on confrontational test. Facial sensation and strength were normal. Uvula tongue midline.  Motor: The patient has good strength in the left upper extremity but limited movement in the right upper extremity. No movement in the lower extremities. Sensory: Sensory testing is intact to soft touch on all 4 extremities. No evidence of extinction is noted.  Coordination: Unable to complete on the right. Normal finger-nose-finger on the left. Gait and station: Patient is in a motorized wheelchair    DIAGNOSTIC DATA (LABS, IMAGING, TESTING) - I reviewed patient records, labs, notes, testing and imaging myself where available.  Lab Results  Component Value Date   WBC 9.8 01/02/2016   HGB 14.0 10/13/2015   HCT 43.9 01/02/2016   MCV 86 01/02/2016   PLT 220 01/02/2016      Component Value Date/Time   NA 134 01/02/2016 1331   K 3.7 01/02/2016 1331   CL 94 (L) 01/02/2016 1331   CO2 23 01/02/2016 1331   GLUCOSE 101 (H) 01/02/2016 1331   GLUCOSE 96 10/13/2015 0904   BUN 14 01/02/2016 1331   CREATININE 0.44 (L) 01/02/2016 1331   CALCIUM 9.8 01/02/2016 1331   PROT 6.8 01/02/2016 1331   ALBUMIN 4.8 01/02/2016 1331   AST 16 01/02/2016 1331   ALT 11 01/02/2016 1331   ALKPHOS 106 01/02/2016 1331   BILITOT 0.2 01/02/2016 1331   GFRNONAA 129 01/02/2016 1331   GFRAA 149 01/02/2016 1331    Lab Results  Component Value Date   TSH 34.636 (H) 10/13/2015      ASSESSMENT AND PLAN 55 y.o. year old male  has a past medical history of Bladder calculi; Chronic back pain; Chronic fatigue; Foley catheter in place; Gait disorder; GERD (gastroesophageal reflux disease); Hyperlipidemia; Hypertension; Hypothyroidism; Incontinence of urine; Multiple sclerosis (HCC) (NEUROLOGIST-- DR Anne HahnWILLIS); Neurogenic bladder; Neurogenic bowel; Pneumonia; Shortness of  breath; Spastic quadriparesis (HCC) (04/19/2013); Urinary retention; Vitamin D deficiency; and Yeast infection. here with:  1. Multiple sclerosis 2. Spastic quadriparesis 3.  Abnormality of gait  Overall the patient has remained stable. He will continue to use baclofen and tizanidine formuscle spasticity. [facility] with bed extinction. I will refill her hydrocodone today. I did check the [facility] drug registry and he is not receiving any other pain medicine from any other provider. I will check blood work today. Advised patient that if his symptoms worsen or he develops any new symptoms that she'll let us know. Follow-up in 6 months or sooner if needed.     [name], MSN, NP-C 05/21/2016, 11:35 AM [facility] [address] [place], [place] [address] [phone]

## 2016-05-21 NOTE — Progress Notes (Signed)
I have read the note, and I agree with the clinical assessment and plan.  Vandy Fong KEITH   

## 2016-05-21 NOTE — Patient Instructions (Addendum)
New order sent for bed Refill on hydrocodone

## 2016-05-22 ENCOUNTER — Telehealth: Payer: Self-pay

## 2016-05-22 ENCOUNTER — Telehealth: Payer: Self-pay | Admitting: *Deleted

## 2016-05-22 LAB — CBC WITH DIFFERENTIAL/PLATELET
Basophils Absolute: 0 10*3/uL (ref 0.0–0.2)
Basos: 0 %
EOS (ABSOLUTE): 0.1 10*3/uL (ref 0.0–0.4)
Eos: 1 %
Hematocrit: 41.5 % (ref 37.5–51.0)
Hemoglobin: 13.7 g/dL (ref 12.6–17.7)
Immature Grans (Abs): 0 10*3/uL (ref 0.0–0.1)
Immature Granulocytes: 0 %
Lymphocytes Absolute: 1.5 10*3/uL (ref 0.7–3.1)
Lymphs: 22 %
MCH: 29.5 pg (ref 26.6–33.0)
MCHC: 33 g/dL (ref 31.5–35.7)
MCV: 89 fL (ref 79–97)
Monocytes Absolute: 0.5 10*3/uL (ref 0.1–0.9)
Monocytes: 7 %
Neutrophils Absolute: 4.7 10*3/uL (ref 1.4–7.0)
Neutrophils: 70 %
Platelets: 201 10*3/uL (ref 150–379)
RBC: 4.65 x10E6/uL (ref 4.14–5.80)
RDW: 13.3 % (ref 12.3–15.4)
WBC: 6.9 10*3/uL (ref 3.4–10.8)

## 2016-05-22 LAB — COMPREHENSIVE METABOLIC PANEL
ALT: 21 IU/L (ref 0–44)
AST: 17 IU/L (ref 0–40)
Albumin/Globulin Ratio: 2 (ref 1.2–2.2)
Albumin: 4.3 g/dL (ref 3.5–5.5)
Alkaline Phosphatase: 127 IU/L — ABNORMAL HIGH (ref 39–117)
BUN/Creatinine Ratio: 27 — ABNORMAL HIGH (ref 9–20)
BUN: 13 mg/dL (ref 6–24)
Bilirubin Total: 0.3 mg/dL (ref 0.0–1.2)
CO2: 24 mmol/L (ref 18–29)
Calcium: 9.3 mg/dL (ref 8.7–10.2)
Chloride: 99 mmol/L (ref 96–106)
Creatinine, Ser: 0.48 mg/dL — ABNORMAL LOW (ref 0.76–1.27)
GFR calc Af Amer: 143 mL/min/{1.73_m2} (ref >59)
GFR calc non Af Amer: 124 mL/min/{1.73_m2} (ref >59)
Globulin, Total: 2.1 g/dL (ref 1.5–4.5)
Glucose: 95 mg/dL (ref 65–99)
Potassium: 4 mmol/L (ref 3.5–5.2)
Sodium: 140 mmol/L (ref 134–144)
Total Protein: 6.4 g/dL (ref 6.0–8.5)

## 2016-05-22 MED ORDER — PERPHENAZINE 2 MG PO TABS: 2.0000 mg | ORAL_TABLET | Freq: Two times a day (BID) | ORAL | 11 refills | Status: DC

## 2016-05-22 NOTE — Telephone Encounter (Signed)
Refill request

## 2016-05-22 NOTE — Telephone Encounter (Signed)
Pt returned Sandy's call °

## 2016-05-22 NOTE — Telephone Encounter (Signed)
-----   Message from Butch PennyMegan Millikan, NP sent at 05/22/2016 12:01 PM EDT ----- Lab work consistent with previous lab work. Alkaline phosphatase slightly elevated. We will monitor

## 2016-05-23 NOTE — Telephone Encounter (Signed)
Patient returned Sandy's call regarding lab results, please call [phone].

## 2016-05-23 NOTE — Telephone Encounter (Signed)
Spoke to pt and let him know the lab results.  He verbalized understanding.

## 2016-05-25 ENCOUNTER — Other Ambulatory Visit: Payer: Self-pay | Admitting: Neurology

## 2016-06-18 ENCOUNTER — Telehealth: Payer: Self-pay | Admitting: Neurology

## 2016-06-18 NOTE — Telephone Encounter (Signed)
Spoke with the patient who was wanting to know if he would have to pay the same copay every time he receives the injections. I told him I would look into this with his insurance company and call him back.   Called his insurance and they stated that there is a 20 percent copay on this medication which is the reason his payment it so high.   Called patient back to go over this information with him. He did not answer so I left him a VM asking him to call back.

## 2016-06-18 NOTE — Telephone Encounter (Signed)
Dyke MaesCarl Powers called requesting to speak w/Danille, please call [phone].

## 2016-06-19 NOTE — Telephone Encounter (Signed)
Spoke with the patient regarding his payment on the medication. I informed him that insurance had told me that was his current copay. He decided to hold off on injections for right now.

## 2016-06-20 NOTE — Telephone Encounter (Signed)
Patient called requesting to speak with Samuel [name] regarding BOTOX and why co-pay is so high, asks if it's because we ordered too much of this medication? Please call to advise.

## 2016-06-21 NOTE — Telephone Encounter (Signed)
Please refer to previous phone,  Late Entry from 06/18/16   Spoke with the patient who was wanting to know if he would have to pay the same copay every time he receives the injections. I told him I would look into this with his insurance company and call him back.   Called his insurance and they stated that there is a 20 percent copay on this medication which is the reason his payment it so high.   Called patient back to go over this information with him. He did not answer so I left him a VM asking him to call back.

## 2016-06-25 ENCOUNTER — Encounter: Payer: Self-pay | Admitting: *Deleted

## 2016-06-25 ENCOUNTER — Other Ambulatory Visit: Payer: Self-pay | Admitting: Neurology

## 2016-06-27 ENCOUNTER — Telehealth: Payer: Self-pay | Admitting: Neurology

## 2016-06-27 NOTE — Telephone Encounter (Signed)
Jed/[facility] [phone] called wanting to know if Dr w rec'd the inquiry on the patient that was faxed 10/23 @ 8:41 am. He is wanting to know when it can be faxed back.  He was vague with information so I do not know what he is asking.

## 2016-06-27 NOTE — Telephone Encounter (Signed)
error 

## 2016-06-28 NOTE — Telephone Encounter (Signed)
Returned call to [facility] and spoke to [place]. Let her know that we have not yet received requested forms. She agreed to re-fax "Provider Query" to F # [phone].

## 2016-07-06 ENCOUNTER — Other Ambulatory Visit: Payer: Self-pay | Admitting: Neurology

## 2016-07-09 NOTE — Telephone Encounter (Signed)
Rx printed, signed, faxed to pharmacy. 

## 2016-07-23 ENCOUNTER — Telehealth: Payer: Self-pay | Admitting: Neurology

## 2016-07-23 NOTE — Telephone Encounter (Signed)
error 

## 2016-09-05 ENCOUNTER — Other Ambulatory Visit: Payer: Self-pay | Admitting: Neurology

## 2016-09-05 NOTE — Telephone Encounter (Signed)
Rx printed, signed, faxed to pharmacy. 

## 2016-09-16 ENCOUNTER — Telehealth: Payer: Self-pay | Admitting: Neurology

## 2016-09-16 NOTE — Telephone Encounter (Signed)
Pt called says he is having problems with the hospital bed. Says he has LVM at [facility] before Thanksgiving but has has not heard back , he has not called them back. Pt is also wanting to discuss having botox. He said it cost $850 last appt and he thinks too much botox was ordered. He said hands and arms are drawing up (routed to [place]).

## 2016-09-30 NOTE — Telephone Encounter (Signed)
Also called and left mssg w/ [facility], re-faxed order to them as well. Called and spoke to pt's mom to let her know. She agreed to notify patient.

## 2016-09-30 NOTE — Telephone Encounter (Signed)
Pt called back, said he has not heard from DME. He is wanting to try another DME co to get another bed if possible. Pt said DME is  [facility] [phone] not [facility]. He has since LVM on 09/25/16 with no response. Please call him at [phone] or [phone]

## 2016-10-01 NOTE — Telephone Encounter (Signed)
Called pt and he says that despite use of bed extension, his feet are still up against the foot board when raising head of the bed. Recommended that he first raise foot of bed to prevent slipping prior to elevating HOB. He also mentioned that bed may be slanting to one side or the other and it made a clanging noise yesterday. He has agreed to call back to speak to technician @ [facility].

## 2016-10-01 NOTE — Telephone Encounter (Signed)
Spoke to [facility] who said that pt received a new bed last year and insurance will not pay for another one. He also received bed extension as ordered in Sept. If pt is having further difficulties w/ equipment, he will need to call in to trouble-shoot w/ technician.

## 2016-10-03 ENCOUNTER — Other Ambulatory Visit: Payer: Self-pay | Admitting: Neurology

## 2016-10-04 NOTE — Telephone Encounter (Signed)
Rx printed, signed, faxed to pharmacy. 

## 2016-11-12 ENCOUNTER — Emergency Department (HOSPITAL_COMMUNITY)
Admission: EM | Admit: 2016-11-12 | Discharge: 2016-11-12 | Disposition: A | Discharge status: Home / Self Care | Payer: Medicare Other | Attending: Emergency Medicine | Admitting: Emergency Medicine

## 2016-11-12 ENCOUNTER — Encounter (HOSPITAL_COMMUNITY): Payer: Self-pay

## 2016-11-12 DIAGNOSIS — I10 Essential (primary) hypertension: Secondary | ICD-10-CM | POA: Diagnosis not present

## 2016-11-12 DIAGNOSIS — Z87891 Personal history of nicotine dependence: Secondary | ICD-10-CM | POA: Diagnosis not present

## 2016-11-12 DIAGNOSIS — E039 Hypothyroidism, unspecified: Secondary | ICD-10-CM | POA: Insufficient documentation

## 2016-11-12 DIAGNOSIS — Z79899 Other long term (current) drug therapy: Secondary | ICD-10-CM | POA: Insufficient documentation

## 2016-11-12 DIAGNOSIS — N3000 Acute cystitis without hematuria: Secondary | ICD-10-CM | POA: Insufficient documentation

## 2016-11-12 DIAGNOSIS — R109 Unspecified abdominal pain: Secondary | ICD-10-CM | POA: Diagnosis present

## 2016-11-12 LAB — URINALYSIS, ROUTINE W REFLEX MICROSCOPIC
Bilirubin Urine: NEGATIVE
Glucose, UA: NEGATIVE mg/dL
Ketones, ur: NEGATIVE mg/dL
Nitrite: NEGATIVE
Protein, ur: 30 mg/dL — AB
Specific Gravity, Urine: 1.016 (ref 1.005–1.030)
pH: 7 (ref 5.0–8.0)

## 2016-11-12 MED ORDER — SULFAMETHOXAZOLE-TRIMETHOPRIM 800-160 MG PO TABS: 1.0000 | ORAL_TABLET | Freq: Two times a day (BID) | ORAL | 0 refills | Status: AC

## 2016-11-12 MED ORDER — SULFAMETHOXAZOLE-TRIMETHOPRIM 800-160 MG PO TABS
1.0000 | ORAL_TABLET | Freq: Once | ORAL | Status: AC
  Administered 2016-11-12: 1 via ORAL
  Filled 2016-11-12: qty 1

## 2016-11-12 MED ORDER — TRAMADOL HCL 50 MG PO TABS
100.0000 mg | ORAL_TABLET | Freq: Once | ORAL | Status: AC
  Administered 2016-11-12: 100 mg via ORAL
  Filled 2016-11-12: qty 2

## 2016-11-12 NOTE — ED Notes (Addendum)
Pt verbalized understanding of discharge instructions. Awaiting family arrival to receive patient

## 2016-11-12 NOTE — ED Triage Notes (Signed)
PT RECEIVED FROM HOME VIA EMS C/O SUPRA-PUBIC CATHETER PAIN AND POSSIBLE INFECTION AT THE SITE. PER EMS, THE PT HAD THE CATHETER CHANGED IN FEB, AND FOR A FEW DAYS HAS NOTICED REDNESS AND PUS FROM THE SITE. PT HAS A HX OF MS, AND GETS FREQUENT INFECTIONS AT THE SITE. DENIES BLOOD IN THE URINE.

## 2016-11-12 NOTE — ED Provider Notes (Signed)
[facility]-EMERGENCY DEPT Provider Note   CSN: [id number] Arrival date & time: 11/12/16  1757     History   Chief Complaint Chief Complaint  Patient presents with  . Abdominal Pain    SUPRA PUBIC CATH PAIN    HPI Samuel [name] is a 56 y.o. male.  HPI   the patient is a 56 year old male, he has a history of multiple sclerosis , he has a chronic indwelling suprapubic catheter and presents after having some purulent drainage from the  Catheter site. He denies fevers chills nausea vomiting or abdominal pain in general. The symptoms started in the last 24 hours, they are mild, persistent, he has last had his catheter changed approximately 4 weeks ago. He states that it is been present for 2 years, he has denies having bladders spasms at this time.   Past Medical History:  Diagnosis Date  . Bladder calculi   . Chronic back pain   . Chronic fatigue   . Foley catheter in place   . Gait disorder   . GERD (gastroesophageal reflux disease)   . Hyperlipidemia   . Hypertension   . Hypothyroidism   . Incontinence of urine   . Multiple sclerosis (HCC) NEUROLOGIST-- [name]   dx 1993, JC virus negative 2/14---  CURRENTLY RECEIVING TYSABRI IV TREATMENT  . Neurogenic bladder   . Neurogenic bowel    intermittant constipation and diarrhea  . Pneumonia    dx  03-03-2014--  admitted for iv and oral antibiotics  . Shortness of breath    secondary to fatique  . 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 Urinary retention   . Vitamin D deficiency   . Yeast infection    genital area    Patient Active Problem List   Diagnosis Date Noted  . Seizure (HCC)   . UTI (lower urinary tract infection) 10/10/2015  . Essential hypertension 10/10/2015  . Nausea & vomiting 10/10/2015  . Malfunction of Foley catheter (HCC)   . Malnutrition of moderate degree (HCC) 07/14/2014  . History of ESBL Klebsiella pneumoniae infection 07/14/2014  . UTI (urinary tract infection) 07/10/2014  . Neurogenic bladder  03/21/2014  . Acute retention of urine 03/03/2014  . PNA (pneumonia) 03/02/2014  . Pneumonia 03/02/2014  . Multiple sclerosis (HCC) 04/19/2013  . 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 APPENDECTOMY  age 47  . CYSTOSCOPY N/A 03/21/2014   Procedure: CYSTOSCOPY TREATMENT OF BLADDER CALCULI;  Surgeon: [name], MD;  Location: [facility];  Service: Urology;  Laterality: N/A;  . INSERTION OF SUPRAPUBIC CATHETER N/A 03/21/2014   Procedure: INSERTION OF SUPRAPUBIC CATHETER;  Surgeon: [name], MD;  Location: [facility];  Service: Urology;  Laterality: N/A;  . NASAL SEPTUM SURGERY  1996  . NEGATIVE SLEEP STUDY  1996  . TRANSTHORACIC ECHOCARDIOGRAM  02-07-2004   MILD LVH/  EF 55-65%       Home Medications    Prior to Admission medications   Medication Sig Start Date End Date Taking? Authorizing Provider  acetic acid 0.25 % irrigation Irrigate with as directed daily. Instill 30ml and clamp tube for 30 minutes then drain. 06/03/15   [name], MD  ALPRAZolam Prudy Feeler) 1 MG tablet TAKE ONE TABLET BY MOUTH THREE TIMES DAILY 07/08/16   York Spaniel, MD  baclofen (LIORESAL) 20 MG tablet TAKE TWO TABLETS BY MOUTH THREE TIMES DAILY Patient not taking: Reported  on 05/21/2016 04/19/16   York Spaniel, MD  dantrolene (DANTRIUM) 100 MG capsule TAKE ONE CAPSULE BY MOUTH THREE TIMES DAILY 10/30/15   York Spaniel, MD  HYDROcodone-acetaminophen (NORCO/VICODIN) 5-325 MG tablet Take 1 tablet by mouth every 6 (six) hours as needed. 05/21/16   [name], NP  levETIRAcetam (KEPPRA) 500 MG tablet Take 1 tablet (500 mg total) by mouth 2 (two) times daily. 10/13/15   Calvert Cantor, MD  levothyroxine (SYNTHROID, LEVOTHROID) 125 MCG tablet Take 125 mcg by mouth daily. 05/22/15   Historical Provider, MD  lisinopril-hydrochlorothiazide (PRINZIDE,ZESTORETIC) 10-12.5 MG tablet Take 0.5  tablets by mouth every morning. 09/05/15   Historical Provider, MD  mirabegron ER (MYRBETRIQ) 50 MG TB24 tablet Take 50 mg by mouth daily.    Historical Provider, MD  omeprazole (PRILOSEC) 20 MG capsule Take 20 mg by mouth every morning.     Historical Provider, MD  OnabotulinumtoxinA (BOTOX IJ) Inject 300 Units as directed every 3 (three) months. Administered at MD office    Historical Provider, MD  perphenazine (TRILAFON) 2 MG tablet Take 1 tablet (2 mg total) by mouth 2 (two) times daily. 05/24/16   York Spaniel, MD  scopolamine (TRANSDERM-SCOP, 1.5 MG,) 1 MG/3DAYS Place 1 patch (1.5 mg total) onto the skin every 3 (three) days. 05/18/15   York Spaniel, MD  solifenacin (VESICARE) 10 MG tablet Take 10 mg by mouth daily.    Historical Provider, MD  sulfamethoxazole-trimethoprim (BACTRIM DS,SEPTRA DS) 800-160 MG tablet Take 1 tablet by mouth 2 (two) times daily. 11/12/16 11/19/16  Eber Hong, MD  tiZANidine (ZANAFLEX) 4 MG tablet Take 2 tablets three times daily. 06/25/16   York Spaniel, MD  traMADol (ULTRAM) 50 MG tablet TAKE ONE TABLET BY MOUTH EVERY 6 HOURS AS NEEDED 10/04/16   York Spaniel, MD  trazodone (DESYREL) 300 MG tablet TAKE ONE TABLET BY MOUTH AT BEDTIME 05/27/16   York Spaniel, MD  venlafaxine XR (EFFEXOR-XR) 75 MG 24 hr capsule Take 2 capsules (150 mg total) by mouth daily with breakfast. 05/14/16   York Spaniel, MD  Vitamin D, Ergocalciferol, (DRISDOL) 50000 UNITS CAPS Take 50,000 Units by mouth every 7 (seven) days. Friday only    Historical Provider, MD    Family History Family History  Problem Relation Age of Onset  . Stroke Father   . Cirrhosis Brother   . Hypothyroidism Sister     Social History Social History  Substance Use Topics  . Smoking status: Former Smoker    Packs/day: 1.50    Years: 8.00    Types: Cigarettes    Quit date: 06/17/1985  . Smokeless tobacco: Never Used  . Alcohol use No     Allergies   Penicillins   Review of  Systems Review of Systems  Constitutional: Negative for fever.  Respiratory: Negative for cough and shortness of breath.   Gastrointestinal: Negative for abdominal pain.     Physical Exam Updated Vital Signs BP 120/76 (BP Location: Left Arm)   Pulse 75   Temp 98.7 F (37.1 C) (Oral)   Resp 16   Ht 6\' 1"  (1.854 m)   Wt 160 lb (72.6 kg)   SpO2 97%   BMI 21.11 kg/m   Physical Exam  Constitutional: He appears well-developed and well-nourished.  HENT:  Head: Normocephalic and atraumatic.  Eyes: Conjunctivae are normal. Right eye exhibits no discharge. Left eye exhibits no discharge.  Pulmonary/Chest: Effort normal. No respiratory distress.  Abdominal:  Abdomen is  soft and non tender - the SP catheter site is clean without redness or induration - there is some mild purulence along the edge of the catheter - cloudy urine in the catheter.  Neurological: He is alert. Coordination normal.  Skin: Skin is warm and dry. No rash noted. He is not diaphoretic. No erythema.  Psychiatric: He has a normal mood and affect.  Nursing note and vitals reviewed.    ED Treatments / Results  Labs (all labs ordered are listed, but only abnormal results are displayed) Labs Reviewed  URINALYSIS, ROUTINE W REFLEX MICROSCOPIC - Abnormal; Notable for the following:       Result Value   APPearance CLOUDY (*)    Hgb urine dipstick MODERATE (*)    Protein, ur 30 (*)    Leukocytes, UA LARGE (*)    Bacteria, UA MANY (*)    Squamous Epithelial / LPF 0-5 (*)    All other components within normal limits  URINE CULTURE     Radiology No results found.  Procedures SUPRAPUBIC TUBE PLACEMENT Date/Time: 11/12/2016 7:21 PM Performed by: Eber Hong Authorized by: Eber Hong   Consent:    Consent obtained:  Verbal   Consent given by:  Patient Anesthesia (see MAR for exact dosages):    Anesthesia method:  None Procedure details:    Complexity:  Simple   Catheter type:  Foley   Catheter size:   22 Fr   Ultrasound guidance: no     Number of attempts:  1   Urine characteristics:  Clear and yellow Post-procedure details:    Patient tolerance of procedure:  Tolerated well, no immediate complications   (including critical care time)  Medications Ordered in ED Medications  traMADol (ULTRAM) tablet 100 mg (100 mg Oral Given 11/12/16 1924)  sulfamethoxazole-trimethoprim (BACTRIM DS,SEPTRA DS) 800-160 MG per tablet 1 tablet (1 tablet Oral Given 11/12/16 1924)    Initial Impression / Assessment and Plan / ED Course  I have reviewed the triage vital signs and the nursing notes.  Pertinent labs & imaging results that were available during my care of the patient were reviewed by me and considered in my medical decision making (see chart for details).  I personally replaced the patients foley catheter and it went very smoothly - there was no collection of purulent material - this was done in a sterile fashion.  There has been yellow urine return - appears clear.  UA shows likely UTI - culture sent - Bactrim  Final Clinical Impressions(s) / ED Diagnoses   Final diagnoses:  Acute cystitis without hematuria    New Prescriptions New Prescriptions   SULFAMETHOXAZOLE-TRIMETHOPRIM (BACTRIM DS,SEPTRA DS) 800-160 MG TABLET    Take 1 tablet by mouth 2 (two) times daily.     Eber Hong, MD 11/12/16 2000

## 2016-11-12 NOTE — Discharge Instructions (Signed)

## 2016-11-14 LAB — URINE CULTURE

## 2016-11-19 ENCOUNTER — Ambulatory Visit (INDEPENDENT_AMBULATORY_CARE_PROVIDER_SITE_OTHER): Payer: Medicare Other | Admitting: Neurology

## 2016-11-19 ENCOUNTER — Other Ambulatory Visit (INDEPENDENT_AMBULATORY_CARE_PROVIDER_SITE_OTHER): Payer: Self-pay

## 2016-11-19 ENCOUNTER — Encounter: Payer: Self-pay | Admitting: Neurology

## 2016-11-19 VITALS — BP 111/77 | HR 75 | Temp 101.0°F

## 2016-11-19 DIAGNOSIS — Z0289 Encounter for other administrative examinations: Secondary | ICD-10-CM

## 2016-11-19 DIAGNOSIS — Z5181 Encounter for therapeutic drug level monitoring: Secondary | ICD-10-CM

## 2016-11-19 DIAGNOSIS — G35 Multiple sclerosis: Secondary | ICD-10-CM

## 2016-11-19 NOTE — Progress Notes (Signed)
Reason for visit: Multiple sclerosis  Samuel [name] is an 56 y.o. male  History of present illness:  Samuel [name] is a 28 are old right-handed white male with a history of multiple sclerosis associated with a chronic progressive course with spastic quadriparesis. The patient recently was in the emergency room on 11/12/2016 with an infection around his suprapubic catheter. This was treated with antibiotics, this has improved. The patient has had a slowly progressive problem with use of the arms, he has no use of the legs. He has lost the use of the right hand and most of the use of the left hand. His right arm is in flexion. He still has spasms involving the legs and back. He has recently had a basal cell carcinoma resected from the shoulder on the left. The patient has some occasional sharp lancinating pains in the feet. This is very brief lasting 6-8 seconds, and is only occasional in nature. The patient has gotten the first 2 doses of Ocrevus, he comes in today for a revisit and for his next dose of Ocrevus.  Past Medical History:  Diagnosis Date  . Bladder calculi   . Chronic back pain   . Chronic fatigue   . Foley catheter in place   . Gait disorder   . GERD (gastroesophageal reflux disease)   . Hyperlipidemia   . Hypertension   . Hypothyroidism   . Incontinence of urine   . Multiple sclerosis (HCC) NEUROLOGIST-- [name]   dx 1993, JC virus negative 2/14---  CURRENTLY RECEIVING TYSABRI IV TREATMENT  . Neurogenic bladder   . Neurogenic bowel    intermittant constipation and diarrhea  . Pneumonia    dx  03-03-2014--  admitted for iv and oral antibiotics  . Shortness of breath    secondary to fatique  . 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 Urinary retention   . Vitamin D deficiency   . Yeast infection    genital area    Past Surgical History:  Procedure Laterality Date  . APPENDECTOMY  age 59  . CYSTOSCOPY N/A 03/21/2014   Procedure: CYSTOSCOPY TREATMENT OF BLADDER  CALCULI;  Surgeon: [name], MD;  Location: [facility];  Service: Urology;  Laterality: N/A;  . INSERTION OF SUPRAPUBIC CATHETER N/A 03/21/2014   Procedure: INSERTION OF SUPRAPUBIC CATHETER;  Surgeon: [name], MD;  Location: [facility];  Service: Urology;  Laterality: N/A;  . NASAL SEPTUM SURGERY  1996  . NEGATIVE SLEEP STUDY  1996  . TRANSTHORACIC ECHOCARDIOGRAM  02-07-2004   MILD LVH/  EF 55-65%    Family History  Problem Relation Age of Onset  . Stroke Father   . Cirrhosis Brother   . Hypothyroidism Sister     Social history:  reports that he quit smoking about 31 years ago. His smoking use included Cigarettes. He has a 12.00 pack-year smoking history. He has never used smokeless tobacco. He reports that he does not drink alcohol or use drugs.    Allergies  Allergen Reactions  . Penicillins Other (See Comments)    Unknown childhoood reaction (takes amoxicillian with reaction) Has tolerated ceftriaxone, cefepime, and Primaxin during inpatient admissions    Medications:  Prior to Admission medications   Medication Sig Start Date End Date Taking? Authorizing Provider  acetic acid 0.25 % irrigation Irrigate with as directed daily. Instill 73ml and clamp tube for 30 minutes then drain. 06/03/15  Yes [name], MD  ALPRAZolam Prudy Feeler) 1  MG tablet TAKE ONE TABLET BY MOUTH THREE TIMES DAILY 07/08/16  Yes York Spaniel, MD  baclofen (LIORESAL) 20 MG tablet TAKE TWO TABLETS BY MOUTH THREE TIMES DAILY 04/19/16  Yes York Spaniel, MD  dantrolene (DANTRIUM) 100 MG capsule TAKE ONE CAPSULE BY MOUTH THREE TIMES DAILY 10/30/15  Yes York Spaniel, MD  HYDROcodone-acetaminophen (NORCO/VICODIN) 5-325 MG tablet Take 1 tablet by mouth every 6 (six) hours as needed. 05/21/16  Yes [name], NP  levothyroxine (SYNTHROID, LEVOTHROID) 125 MCG tablet Take 125 mcg by mouth daily. 05/22/15  Yes Historical Provider, MD  lisinopril-hydrochlorothiazide  (PRINZIDE,ZESTORETIC) 10-12.5 MG tablet Take 0.5 tablets by mouth every morning. 09/05/15  Yes Historical Provider, MD  mirabegron ER (MYRBETRIQ) 50 MG TB24 tablet Take 50 mg by mouth daily.   Yes Historical Provider, MD  omeprazole (PRILOSEC) 20 MG capsule Take 20 mg by mouth every morning.    Yes Historical Provider, MD  OnabotulinumtoxinA (BOTOX IJ) Inject 300 Units as directed every 3 (three) months. Administered at MD office   Yes Historical Provider, MD  perphenazine (TRILAFON) 2 MG tablet Take 1 tablet (2 mg total) by mouth 2 (two) times daily. 05/24/16  Yes York Spaniel, MD  scopolamine (TRANSDERM-SCOP, 1.5 MG,) 1 MG/3DAYS Place 1 patch (1.5 mg total) onto the skin every 3 (three) days. 05/18/15  Yes York Spaniel, MD  solifenacin (VESICARE) 10 MG tablet Take 10 mg by mouth daily.   Yes Historical Provider, MD  sulfamethoxazole-trimethoprim (BACTRIM DS,SEPTRA DS) 800-160 MG tablet Take 1 tablet by mouth 2 (two) times daily. 11/12/16 11/19/16 Yes Eber Hong, MD  tiZANidine (ZANAFLEX) 4 MG tablet Take 2 tablets three times daily. 06/25/16  Yes York Spaniel, MD  traMADol (ULTRAM) 50 MG tablet TAKE ONE TABLET BY MOUTH EVERY 6 HOURS AS NEEDED 10/04/16  Yes York Spaniel, MD  trazodone (DESYREL) 300 MG tablet TAKE ONE TABLET BY MOUTH AT BEDTIME 05/27/16  Yes York Spaniel, MD  venlafaxine XR (EFFEXOR-XR) 75 MG 24 hr capsule Take 2 capsules (150 mg total) by mouth daily with breakfast. 05/14/16  Yes York Spaniel, MD  Vitamin D, Ergocalciferol, (DRISDOL) 50000 UNITS CAPS Take 50,000 Units by mouth every 7 (seven) days. Friday only   Yes Historical Provider, MD  levETIRAcetam (KEPPRA) 500 MG tablet Take 1 tablet (500 mg total) by mouth 2 (two) times daily. Patient not taking: Reported on 11/19/2016 10/13/15   Calvert Cantor, MD    ROS:  Out of a complete 14 system review of symptoms, the patient complains only of the following symptoms, and all other reviewed systems are  negative.  Muscle spasms Numbness, weakness  Blood pressure 111/77, pulse 75, temperature (!) 101 F (38.3 C), SpO2 95 %.  Physical Exam  General: The patient is alert and cooperative at the time of the examination.  Skin: No significant peripheral edema is noted. Atrophy of the lower extremities is noted.   Neurologic Exam  Mental status: The patient is alert and oriented x 3 at the time of the examination. The patient has apparent normal recent and remote memory, with an apparently normal attention span and concentration ability.   Cranial nerves: Facial symmetry is present. Speech is normal, no aphasia or dysarthria is noted. Extraocular movements are full, with exception that the patient has a prominent left INO. Visual fields are full. Pupils are equal, round, and reactive to light. Discs are flat bilaterally.  Motor: The patient has the right arm in flexion, minimal  grip noted with the right hand. The fingers and the right hand are in flexion as they are on the left hand. With the left arm, there is 4/5 strength with flexion and extension at the elbow and with elevation of the arm. The patient has no voluntary movement of the lower extremities. Increased motor tone is seen throughout.  Sensory examination: Soft touch sensation is symmetric on the face, arms, and legs.  Coordination: The patient has the ability to perform finger-nose-finger with the left hand, not with the right arm. The patient cannot perform heel-to-shin on either side.  Gait and station: The patient could not be ambulated, he is wheelchair-bound.  Reflexes: Deep tendon reflexes are symmetric, but are depressed in the legs.   Assessment/Plan:  1. Multiple sclerosis, chronic progressive course  2. Spastic quadriparesis  The patient will be getting Ocrevus today. He will have blood work done today. He will follow-up in 6 months. I have once again discussed the possibility of a baclofen pump placement. At  some point, MRI evaluation of the brain will need to be done. The patient will have significant difficulty getting into an MRI scanner.  Marlan Palau MD 11/19/2016 12:34 PM  [facility] [address] [place], [place] [address]  Phone [phone] Fax [phone]

## 2016-11-20 ENCOUNTER — Telehealth: Payer: Self-pay | Admitting: *Deleted

## 2016-11-20 LAB — CBC WITH DIFFERENTIAL/PLATELET
Basophils Absolute: 0 10*3/uL (ref 0.0–0.2)
Basos: 0 %
EOS (ABSOLUTE): 0.1 10*3/uL (ref 0.0–0.4)
Eos: 1 %
Hematocrit: 45.8 % (ref 37.5–51.0)
Hemoglobin: 15.3 g/dL (ref 13.0–17.7)
Immature Grans (Abs): 0 10*3/uL (ref 0.0–0.1)
Immature Granulocytes: 1 %
Lymphocytes Absolute: 1.4 10*3/uL (ref 0.7–3.1)
Lymphs: 16 %
MCH: 30.1 pg (ref 26.6–33.0)
MCHC: 33.4 g/dL (ref 31.5–35.7)
MCV: 90 fL (ref 79–97)
Monocytes Absolute: 0.6 10*3/uL (ref 0.1–0.9)
Monocytes: 7 %
Neutrophils Absolute: 6.5 10*3/uL (ref 1.4–7.0)
Neutrophils: 75 %
Platelets: 254 10*3/uL (ref 150–379)
RBC: 5.09 x10E6/uL (ref 4.14–5.80)
RDW: 13.1 % (ref 12.3–15.4)
WBC: 8.6 10*3/uL (ref 3.4–10.8)

## 2016-11-20 LAB — COMPREHENSIVE METABOLIC PANEL
ALT: 39 IU/L (ref 0–44)
AST: 26 IU/L (ref 0–40)
Albumin/Globulin Ratio: 2.2 (ref 1.2–2.2)
Albumin: 4.7 g/dL (ref 3.5–5.5)
Alkaline Phosphatase: 130 IU/L — ABNORMAL HIGH (ref 39–117)
BUN/Creatinine Ratio: 14 (ref 9–20)
BUN: 10 mg/dL (ref 6–24)
Bilirubin Total: 0.3 mg/dL (ref 0.0–1.2)
CO2: 20 mmol/L (ref 18–29)
Calcium: 9.4 mg/dL (ref 8.7–10.2)
Chloride: 98 mmol/L (ref 96–106)
Creatinine, Ser: 0.73 mg/dL — ABNORMAL LOW (ref 0.76–1.27)
GFR calc Af Amer: 120 mL/min/{1.73_m2} (ref >59)
GFR calc non Af Amer: 104 mL/min/{1.73_m2} (ref >59)
Globulin, Total: 2.1 g/dL (ref 1.5–4.5)
Glucose: 151 mg/dL — ABNORMAL HIGH (ref 65–99)
Potassium: 4.1 mmol/L (ref 3.5–5.2)
Sodium: 138 mmol/L (ref 134–144)
Total Protein: 6.8 g/dL (ref 6.0–8.5)

## 2016-11-20 NOTE — Telephone Encounter (Signed)
Called pt back. Relayed per CW,MD okay to proceed with stretching exercises. Relayed message below. He verbalized understanding.

## 2016-11-20 NOTE — Telephone Encounter (Signed)
-----   Message from York Spaniel, MD sent at 11/20/2016  8:00 AM EDT -----   The blood work results are unremarkable, with exception of a minimal chronic stable elevation in alkaline phosphatase level. No clinical concerns. Please call the patient. ----- Message ----- From: Nell [facility] Lab Results In Sent: 11/20/2016   7:42 AM To: York Spaniel, MD

## 2016-11-20 NOTE — Telephone Encounter (Signed)
Called and spoke to pt about lab results per CW,MD note. He verbalized understanding.    He also forgot to ask [name] if it would be beneficial for him to stretch hamstrings, doing stretching exercises with legs. Advised it should be okay but will ask [name] to make sure.

## 2016-11-20 NOTE — Telephone Encounter (Signed)
No restrictions with stressing the legs, stretching will be necessary to help temporarily reduce spasticity, mobility in the joints of the hips and knees.

## 2016-11-21 ENCOUNTER — Other Ambulatory Visit: Payer: Self-pay | Admitting: Neurology

## 2016-12-04 ENCOUNTER — Other Ambulatory Visit: Payer: Self-pay | Admitting: Neurology

## 2016-12-04 NOTE — Telephone Encounter (Signed)
Faxed printed/signed rx tramadol to pt pharmacy. [phone] (Fax). Received confirmation.

## 2016-12-23 ENCOUNTER — Telehealth: Payer: Self-pay | Admitting: Neurology

## 2016-12-23 DIAGNOSIS — G35 Multiple sclerosis: Secondary | ICD-10-CM

## 2016-12-23 DIAGNOSIS — G8250 Quadriplegia, unspecified: Secondary | ICD-10-CM

## 2016-12-23 NOTE — Telephone Encounter (Signed)
I called the sister. The patient is finding it a manual wheelchair, he already has an Mining engineer wheelchair. There could be an issue with insurance paying for both chairs within a 5 year period.  I will go ahead and write the prescription, they can work with the DME company on this issue.

## 2016-12-23 NOTE — Telephone Encounter (Signed)
Pt's sister called requesting order for [employer] wheelchair. York Spaniel he has an Passenger transport manager but their mother is wanting to be able to push him outside to sit on the deck. Please call

## 2016-12-23 NOTE — Telephone Encounter (Signed)
Tried calling sister back. VM full, unable to LVM. Wanted to ask her if she would like to pick up order for wheelchair or have it mailed to her.

## 2016-12-24 NOTE — Telephone Encounter (Signed)
Called and spoke with sister. She would like order for wheelchair mailed to her. Verified her mailing address on DPR. Advised I will put in mail to go out tomorrow. She verbalized understanding and appreciation.

## 2016-12-24 NOTE — Telephone Encounter (Signed)
Tried calling again, mailbox full, unable to LVM. Please let sister know order for wheelchair up front for pick up. We can mail copy if she would like that instead.

## 2016-12-25 ENCOUNTER — Telehealth: Payer: Self-pay | Admitting: *Deleted

## 2016-12-25 NOTE — Telephone Encounter (Signed)
Gave completed/signed disability paperwork from [place] to medical records to process.

## 2017-01-01 DIAGNOSIS — Z0289 Encounter for other administrative examinations: Secondary | ICD-10-CM

## 2017-01-27 ENCOUNTER — Other Ambulatory Visit: Payer: Self-pay | Admitting: Neurology

## 2017-01-29 NOTE — Telephone Encounter (Signed)
Faxed printed/signed rx alprazolam to pt pharmacy. Fax:[phone]. Received confirmation.

## 2017-02-07 ENCOUNTER — Telehealth: Payer: Self-pay | Admitting: Neurology

## 2017-02-07 DIAGNOSIS — G35 Multiple sclerosis: Secondary | ICD-10-CM

## 2017-02-07 DIAGNOSIS — G8250 Quadriplegia, unspecified: Secondary | ICD-10-CM

## 2017-02-07 NOTE — Telephone Encounter (Signed)
Baldo Ash called back with [facility] fax# [phone]

## 2017-02-07 NOTE — Telephone Encounter (Signed)
Samuel [name] powers called and requested to speak with someone regarding the bed that Dr. Anne Hahn has written an rx for. He states that the air mattress is leaking and it is causing his body to lay on the left side of the bed. [facility] has advised them that he needs a new air mattress. Please call and advise. The best number to reach Samuel [name] is [phone] and [facility] number is [phone].

## 2017-02-07 NOTE — Telephone Encounter (Signed)
Baldo Ash called back, returning my call. He stated [facility] requesting order be placed for new air mattress and pump and faxed to [phone]. Advised I will send request to [name].

## 2017-02-07 NOTE — Telephone Encounter (Signed)
I will write a prescription for the air mattress and pump.

## 2017-03-13 ENCOUNTER — Telehealth: Payer: Self-pay | Admitting: *Deleted

## 2017-03-13 NOTE — Telephone Encounter (Signed)
Gave completed/signed life waiver for disability claim form ([employer] insurance company) to medical records to process for patient.

## 2017-03-19 DIAGNOSIS — Z0289 Encounter for other administrative examinations: Secondary | ICD-10-CM

## 2017-04-08 ENCOUNTER — Other Ambulatory Visit: Payer: Self-pay | Admitting: Neurology

## 2017-05-06 ENCOUNTER — Other Ambulatory Visit: Payer: Self-pay | Admitting: Neurology

## 2017-05-12 ENCOUNTER — Other Ambulatory Visit: Payer: Self-pay | Admitting: Neurology

## 2017-05-23 ENCOUNTER — Other Ambulatory Visit: Payer: Self-pay | Admitting: Neurology

## 2017-05-27 ENCOUNTER — Ambulatory Visit (INDEPENDENT_AMBULATORY_CARE_PROVIDER_SITE_OTHER): Payer: Self-pay | Admitting: Neurology

## 2017-05-27 DIAGNOSIS — G35 Multiple sclerosis: Secondary | ICD-10-CM

## 2017-05-28 ENCOUNTER — Encounter: Payer: Self-pay | Admitting: Neurology

## 2017-05-28 NOTE — Progress Notes (Signed)
The patient was rescheduled, had infusion today.

## 2017-06-05 ENCOUNTER — Other Ambulatory Visit: Payer: Self-pay | Admitting: Neurology

## 2017-06-06 NOTE — Telephone Encounter (Signed)
Faxed printed/signed rx tramadol to pt pharmacy. Fax: [phone]. Received confirmation.

## 2017-06-10 ENCOUNTER — Ambulatory Visit (INDEPENDENT_AMBULATORY_CARE_PROVIDER_SITE_OTHER): Payer: Medicare Other | Admitting: Neurology

## 2017-06-10 ENCOUNTER — Encounter: Payer: Self-pay | Admitting: Neurology

## 2017-06-10 VITALS — BP 115/81 | HR 111 | Ht 73.0 in

## 2017-06-10 DIAGNOSIS — G35 Multiple sclerosis: Secondary | ICD-10-CM | POA: Diagnosis not present

## 2017-06-10 DIAGNOSIS — Z5181 Encounter for therapeutic drug level monitoring: Secondary | ICD-10-CM | POA: Diagnosis not present

## 2017-06-10 DIAGNOSIS — N319 Neuromuscular dysfunction of bladder, unspecified: Secondary | ICD-10-CM

## 2017-06-10 NOTE — Progress Notes (Signed)
Reason for visit: Multiple sclerosis  Samuel [name] is an 56 y.o. 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 The patient is on Ocrevus and is tolerating the medication fairly well. He just got an injection several weeks ago, he had hot and cold sensations for a day and a half but recovered. After the injection, he has improvement in the discomfort that he has in the legs. He still has severe spasms of both legs, left greater than right and he has bladder spasms. The patient will have spasms involving the paraspinal muscles, he may have take Ultram at night to help some with the discomfort. The patient has been hesitant to consider a baclofen pump to control the spasticity. He is on maximum doses of the tizanidine, baclofen, and he is on alprazolam. The patient is followed through urology, he may be considered for Botox injections of the bladder in the near future. He returns to the office today for an evaluation.  Past Medical History:  Diagnosis Date  . Bladder calculi   . Chronic back pain   . Chronic fatigue   . Foley catheter in place   . Gait disorder   . GERD (gastroesophageal reflux disease)   . Hyperlipidemia   . Hypertension   . Hypothyroidism   . Incontinence of urine   . Multiple sclerosis (HCC) NEUROLOGIST-- [name]   dx 1993, JC virus negative 2/14---  CURRENTLY RECEIVING TYSABRI IV TREATMENT  . Neurogenic bladder   . Neurogenic bowel    intermittant constipation and diarrhea  . Pneumonia    dx  03-03-2014--  admitted for iv and oral antibiotics  . Shortness of breath    secondary to fatique  . 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 Urinary retention   . Vitamin D deficiency   . Yeast infection    genital area    Past Surgical History:  Procedure Laterality Date  . APPENDECTOMY  age 82  . CYSTOSCOPY N/A 03/21/2014   Procedure:  CYSTOSCOPY TREATMENT OF BLADDER CALCULI;  Surgeon: [name], MD;  Location: [facility];  Service: Urology;  Laterality: N/A;  . INSERTION OF SUPRAPUBIC CATHETER N/A 03/21/2014   Procedure: INSERTION OF SUPRAPUBIC CATHETER;  Surgeon: [name], MD;  Location: [facility];  Service: Urology;  Laterality: N/A;  . NASAL SEPTUM SURGERY  1996  . NEGATIVE SLEEP STUDY  1996  . TRANSTHORACIC ECHOCARDIOGRAM  02-07-2004   MILD LVH/  EF 55-65%    Family History  Problem Relation Age of Onset  . Stroke Father   . Cirrhosis Brother   . Hypothyroidism Sister     Social history:  reports that he quit smoking about 32 years ago. His smoking use included Cigarettes. He has a 12.00 pack-year smoking history. He has never used smokeless tobacco. He reports that he does not drink alcohol or use drugs.    Allergies  Allergen Reactions  . Penicillins Other (See Comments)    Unknown childhoood reaction (takes amoxicillian with reaction) Has tolerated ceftriaxone, cefepime, and Primaxin during inpatient admissions    Medications:  Prior to Admission medications   Medication Sig Start Date End Date Taking? Authorizing Provider  acetic acid 0.25 % irrigation Irrigate with as directed daily. Instill 30ml and clamp tube for 30 minutes then drain. 06/03/15  Yes [name], MD  ALPRAZolam Prudy Feeler) 1  MG tablet TAKE ONE TABLET BY MOUTH THREE TIMES DAILY Patient taking differently: TAKE ONE TABLET BY MOUTH THREE TIMES DAILY prn 01/28/17  Yes York Spaniel, MD  baclofen (LIORESAL) 20 MG tablet TAKE TWO TABLETS BY MOUTH THREE TIMES DAILY 04/09/17  Yes York Spaniel, MD  HYDROcodone-acetaminophen (NORCO/VICODIN) 5-325 MG tablet Take 1 tablet by mouth every 6 (six) hours as needed. 05/21/16  Yes [name], NP  levETIRAcetam (KEPPRA) 500 MG tablet Take 1 tablet (500 mg total) by mouth 2 (two) times daily. 10/13/15  Yes Calvert Cantor, MD  levothyroxine (SYNTHROID,  LEVOTHROID) 125 MCG tablet Take 125 mcg by mouth daily. 05/22/15  Yes [provider]  lisinopril-hydrochlorothiazide (PRINZIDE,ZESTORETIC) 10-12.5 MG tablet Take 0.5 tablets by mouth every morning. 09/05/15  Yes [provider]  mirabegron ER (MYRBETRIQ) 50 MG TB24 tablet Take 50 mg by mouth daily.   Yes [provider]  omeprazole (PRILOSEC) 20 MG capsule Take 20 mg by mouth every morning.    Yes [provider]  OnabotulinumtoxinA (BOTOX IJ) Inject 300 Units as directed every 3 (three) months. Administered at MD office   Yes [provider]  oxybutynin (DITROPAN XL) 15 MG 24 hr tablet Take 15 mg by mouth. 05/12/17  Yes [provider]  perphenazine (TRILAFON) 2 MG tablet Take 1 tablet (2 mg total) by mouth 2 (two) times daily. 05/24/16  Yes York Spaniel, MD  scopolamine (TRANSDERM-SCOP, 1.5 MG,) 1 MG/3DAYS Place 1 patch (1.5 mg total) onto the skin every 3 (three) days. 05/18/15  Yes York Spaniel, MD  solifenacin (VESICARE) 10 MG tablet Take 10 mg by mouth daily.   Yes [provider]  tiZANidine (ZANAFLEX) 4 MG tablet TAKE TWO TABLETS BY MOUTH THREE TIMES DAILY 05/07/17  Yes York Spaniel, MD  traMADol (ULTRAM) 50 MG tablet TAKE 1 TABLET BY MOUTH EVERY 6 HOURS AS NEEDED 06/05/17  Yes York Spaniel, MD  trazodone (DESYREL) 300 MG tablet TAKE 1 TABLET BY MOUTH ONCE DAILY AT BEDTIME 05/26/17  Yes York Spaniel, MD  venlafaxine XR (EFFEXOR-XR) 75 MG 24 hr capsule TAKE TWO CAPSULES BY MOUTH ONCE DAILY WITH BREAKFAST 05/12/17  Yes York Spaniel, MD  Vitamin D, Ergocalciferol, (DRISDOL) 50000 UNITS CAPS Take 50,000 Units by mouth every 7 (seven) days. Friday only   Yes [provider]    ROS:  Out of a complete 14 system review of symptoms, the patient complains only of the following symptoms, and all other reviewed systems are negative.  Chills Loss of vision, blurred vision Shortness of breath Cold  intolerance, heat intolerance Constipation Restless legs, frequent waking, daytime sleepiness Achy muscles, muscle cramps, walking difficulty Skin rash, moles Memory loss, dizziness, headache, numbness  Blood pressure 115/81, pulse (!) 111, height 6\' 1"  (1.854 m).  Physical Exam  General: The patient is alert and cooperative at the time of the examination.  Skin: No significant peripheral edema is noted.   Neurologic Exam  Mental status: The patient is alert and oriented x 3 at the time of the examination. The patient has apparent normal recent and remote memory, with an apparently normal attention span and concentration ability.   Cranial nerves: Facial symmetry is present. Speech is normal, no aphasia or dysarthria is noted. Extraocular movements are full, with exception that patient has bilateral INO. Visual fields are full. Pupils are equal, round, and reactive to light. Discs are flat bilaterally.  Motor: The patient has a flexion contracture of the  right arm, minimal grip with the right hand. The patient has near-normal grip with the left hand, has good biceps and triceps strength. Has difficulty elevating the left arm. The patient has no voluntary movement of either leg. Increased motor tone is noted on all 4 extremities.  Sensory examination: Soft touch sensation is symmetric on the face, arms, and legs.  Coordination: The patient has inability to use the right arm, can perform finger-nose-finger with the left arm with some difficulty, unable to perform heel-to-shin on either side.  Gait and station: The patient is nonambulatory, wheelchair bound.  Reflexes: Deep tendon reflexes are symmetric.   Assessment/Plan:  1. Multiple sclerosis  2. Gait disorder  3. Neurogenic bladder  The patient will remain on Ocrevus, he seemed be tolerating this fairly well. I have once again discussed the possibility of a baclofen pump placement for spasticity, he does not wish to  consider this option. The patient will remain on his current drugs for spasticity, the will follow-up in the near future with urology. He was given a handicapped placard form. He will follow-up in 6 months. Blood work will be done today.  Marlan Palau MD 06/10/2017 12:06 PM  [facility] [address] [place], [place] [address]  Phone [phone] Fax [phone]

## 2017-06-11 ENCOUNTER — Telehealth: Payer: Self-pay | Admitting: Neurology

## 2017-06-11 LAB — CBC WITH DIFFERENTIAL/PLATELET
Basophils Absolute: 0 10*3/uL (ref 0.0–0.2)
Basos: 0 %
EOS (ABSOLUTE): 0.1 10*3/uL (ref 0.0–0.4)
Eos: 1 %
Hematocrit: 44.1 % (ref 37.5–51.0)
Hemoglobin: 15.3 g/dL (ref 13.0–17.7)
Immature Grans (Abs): 0 10*3/uL (ref 0.0–0.1)
Immature Granulocytes: 1 %
Lymphocytes Absolute: 2.1 10*3/uL (ref 0.7–3.1)
Lymphs: 14 %
MCH: 29.9 pg (ref 26.6–33.0)
MCHC: 34.7 g/dL (ref 31.5–35.7)
MCV: 86 fL (ref 79–97)
Monocytes Absolute: 1 10*3/uL — ABNORMAL HIGH (ref 0.1–0.9)
Monocytes: 8 %
Neutrophils Absolute: 10.9 10*3/uL — ABNORMAL HIGH (ref 1.4–7.0)
Neutrophils: 76 %
Platelets: 299 10*3/uL (ref 150–379)
RBC: 5.12 x10E6/uL (ref 4.14–5.80)
RDW: 13.3 % (ref 12.3–15.4)
WBC: 14.2 10*3/uL — ABNORMAL HIGH (ref 3.4–10.8)

## 2017-06-11 LAB — COMPREHENSIVE METABOLIC PANEL
ALT: 29 IU/L (ref 0–44)
AST: 21 IU/L (ref 0–40)
Albumin/Globulin Ratio: 1.9 (ref 1.2–2.2)
Albumin: 4.6 g/dL (ref 3.5–5.5)
Alkaline Phosphatase: 119 IU/L — ABNORMAL HIGH (ref 39–117)
BUN/Creatinine Ratio: 16 (ref 9–20)
BUN: 9 mg/dL (ref 6–24)
Bilirubin Total: 0.3 mg/dL (ref 0.0–1.2)
CO2: 23 mmol/L (ref 20–29)
Calcium: 9.6 mg/dL (ref 8.7–10.2)
Chloride: 98 mmol/L (ref 96–106)
Creatinine, Ser: 0.57 mg/dL — ABNORMAL LOW (ref 0.76–1.27)
GFR calc Af Amer: 133 mL/min/{1.73_m2} (ref >59)
GFR calc non Af Amer: 115 mL/min/{1.73_m2} (ref >59)
Globulin, Total: 2.4 g/dL (ref 1.5–4.5)
Glucose: 75 mg/dL (ref 65–99)
Potassium: 4.1 mmol/L (ref 3.5–5.2)
Sodium: 140 mmol/L (ref 134–144)
Total Protein: 7 g/dL (ref 6.0–8.5)

## 2017-06-11 NOTE — Telephone Encounter (Signed)
I called patient. The patient had a previous infusion 2 weeks ago, he did have fevers and chills for a day and a half afterwards, this has resolved.  Blood work is unremarkable with exception of a mild stable elevation in alkaline phosphatase phosphatase level. The patient is running a high white count for some reason, he denies any symptoms of fevers at this point, no change in color of the urine, no cough or night sweats.  We'll need to keep an eye on this issue, if he begins to have malaise or fevers, he may require a course of antibiotics. He will call me if his clinical condition changes.  He is reporting some knee discomfort following the revisit yesterday.

## 2017-07-23 ENCOUNTER — Telehealth: Payer: Self-pay | Admitting: Neurology

## 2017-07-23 NOTE — Telephone Encounter (Signed)
I called the patient.  The patient has been put on cephalexin for a skin infection.  This may have been the source of the elevated white blood count.  The patient likely had a yeast infection that got an overlying bacterial infection.

## 2017-07-23 NOTE — Telephone Encounter (Signed)
Patient was seen on 06-10-17 and Dr. Anne Hahn said his WBC was elevated and told him to be on the lookout for an infection. He did go to a dermatologist and he does have an infection and is being treated.He would like a returned call to discuss.

## 2017-08-17 ENCOUNTER — Other Ambulatory Visit: Payer: Self-pay | Admitting: Neurology

## 2017-08-18 NOTE — Telephone Encounter (Signed)
Faxed printed/signed rx alprazolam to [facility] at [phone]. Received fax confirmation.

## 2017-08-20 ENCOUNTER — Telehealth: Payer: Self-pay | Admitting: Neurology

## 2017-08-20 NOTE — Telephone Encounter (Signed)
I called the patient.  The patient was seen by Dr. Margo AyeHall from [facility], they did a cystoscope and saw something of concern, they have set him up for a biopsy of the bladder.  The patient is not sure that he wants to have this done.  I have indicated that he has the right to refuse the procedure if he wishes, he also has a right to get another urology Dr. for a second opinion.  If he requires another referral for a urologist, I will be happy to set this up.

## 2017-08-20 NOTE — Telephone Encounter (Signed)
Patient saw urologist yesterday and he wanted to do exploratory surgery on his bladder.. Patient would like to discuss this with Dr. Anne Hahn.

## 2017-08-29 ENCOUNTER — Telehealth: Payer: Self-pay | Admitting: *Deleted

## 2017-08-29 NOTE — Telephone Encounter (Signed)
Pt [employer] form on [employer] desk.

## 2017-09-03 NOTE — Telephone Encounter (Signed)
Gave completed/signed [employer] form back to medical records to process for patient.

## 2017-09-04 ENCOUNTER — Telehealth: Payer: Self-pay | Admitting: *Deleted

## 2017-09-04 DIAGNOSIS — Z0289 Encounter for other administrative examinations: Secondary | ICD-10-CM

## 2017-09-04 NOTE — Telephone Encounter (Signed)
Pt [employer] form faxed on 09/04/17 to [phone]

## 2017-11-08 ENCOUNTER — Other Ambulatory Visit: Payer: Self-pay | Admitting: Neurology

## 2017-11-15 ENCOUNTER — Other Ambulatory Visit: Payer: Self-pay | Admitting: Neurology

## 2017-11-17 ENCOUNTER — Telehealth: Payer: Self-pay | Admitting: Neurology

## 2017-11-17 NOTE — Telephone Encounter (Signed)
I called the patient.  The patient is getting a refill for perphenazine, we have not prescribed it since 2017, but the mother indicates that he continues to get it.  I will call in the prescription.

## 2017-11-22 ENCOUNTER — Other Ambulatory Visit: Payer: Self-pay | Admitting: Neurology

## 2017-11-27 ENCOUNTER — Telehealth: Payer: Self-pay | Admitting: Neurology

## 2017-11-27 MED ORDER — DIAZEPAM 5 MG PO TABS: 5.0000 mg | ORAL_TABLET | Freq: Three times a day (TID) | ORAL | 1 refills | Status: DC

## 2017-11-27 NOTE — Telephone Encounter (Signed)
Pt called he's had an increase in anxiety over the 3-4 days and has been taking more ALPRAZolam (XANAX) 1 MG tablet than prescribed. Pt said he cannot get it refilled until Sunday and can provider give him something to help until he can get the refill. Please call to advise

## 2017-11-27 NOTE — Telephone Encounter (Signed)
I called the patient.  The patient is having a lot of difficulty with anxiety.  He is already on fairly high doses of antidepressant medications.  He has been taking Xanax 1 mg 3 times daily but it is not lasting him to the next dose.  We will switch him to diazepam 5 mg 3 times daily, this medication can be used for spasticity and may help anxiety as well.  When he starts the diazepam, he will stop the alprazolam.

## 2017-12-09 ENCOUNTER — Emergency Department (HOSPITAL_COMMUNITY): Payer: Medicare Other

## 2017-12-09 ENCOUNTER — Inpatient Hospital Stay (HOSPITAL_COMMUNITY)
Admission: EM | Admit: 2017-12-09 | Discharge: 2017-12-12 | DRG: 853 | Disposition: A | Discharge status: Home / Self Care | Payer: Medicare Other | Attending: Internal Medicine | Admitting: Internal Medicine

## 2017-12-09 ENCOUNTER — Other Ambulatory Visit: Payer: Self-pay

## 2017-12-09 ENCOUNTER — Encounter (HOSPITAL_COMMUNITY): Payer: Self-pay | Admitting: Emergency Medicine

## 2017-12-09 DIAGNOSIS — E039 Hypothyroidism, unspecified: Secondary | ICD-10-CM | POA: Diagnosis present

## 2017-12-09 DIAGNOSIS — A419 Sepsis, unspecified organism: Secondary | ICD-10-CM | POA: Diagnosis present

## 2017-12-09 DIAGNOSIS — G8250 Quadriplegia, unspecified: Secondary | ICD-10-CM | POA: Diagnosis present

## 2017-12-09 DIAGNOSIS — Z8619 Personal history of other infectious and parasitic diseases: Secondary | ICD-10-CM | POA: Diagnosis not present

## 2017-12-09 DIAGNOSIS — Z880 Allergy status to penicillin: Secondary | ICD-10-CM

## 2017-12-09 DIAGNOSIS — Z79890 Hormone replacement therapy (postmenopausal): Secondary | ICD-10-CM

## 2017-12-09 DIAGNOSIS — M2450 Contracture, unspecified joint: Secondary | ICD-10-CM | POA: Diagnosis present

## 2017-12-09 DIAGNOSIS — Z87891 Personal history of nicotine dependence: Secondary | ICD-10-CM

## 2017-12-09 DIAGNOSIS — E559 Vitamin D deficiency, unspecified: Secondary | ICD-10-CM | POA: Diagnosis present

## 2017-12-09 DIAGNOSIS — A4159 Other Gram-negative sepsis: Secondary | ICD-10-CM | POA: Diagnosis present

## 2017-12-09 DIAGNOSIS — N390 Urinary tract infection, site not specified: Secondary | ICD-10-CM

## 2017-12-09 DIAGNOSIS — G35 Multiple sclerosis: Secondary | ICD-10-CM | POA: Diagnosis present

## 2017-12-09 DIAGNOSIS — R509 Fever, unspecified: Secondary | ICD-10-CM | POA: Diagnosis present

## 2017-12-09 DIAGNOSIS — N319 Neuromuscular dysfunction of bladder, unspecified: Secondary | ICD-10-CM | POA: Diagnosis present

## 2017-12-09 DIAGNOSIS — Z79899 Other long term (current) drug therapy: Secondary | ICD-10-CM

## 2017-12-09 DIAGNOSIS — N136 Pyonephrosis: Secondary | ICD-10-CM | POA: Diagnosis present

## 2017-12-09 DIAGNOSIS — I10 Essential (primary) hypertension: Secondary | ICD-10-CM | POA: Diagnosis present

## 2017-12-09 DIAGNOSIS — Z960 Presence of urogenital implants: Secondary | ICD-10-CM | POA: Diagnosis present

## 2017-12-09 DIAGNOSIS — F419 Anxiety disorder, unspecified: Secondary | ICD-10-CM | POA: Diagnosis not present

## 2017-12-09 DIAGNOSIS — K219 Gastro-esophageal reflux disease without esophagitis: Secondary | ICD-10-CM | POA: Diagnosis present

## 2017-12-09 DIAGNOSIS — Z87440 Personal history of urinary (tract) infections: Secondary | ICD-10-CM

## 2017-12-09 LAB — COMPREHENSIVE METABOLIC PANEL
ALT: 31 U/L (ref 17–63)
AST: 23 U/L (ref 15–41)
Albumin: 4.2 g/dL (ref 3.5–5.0)
Alkaline Phosphatase: 120 U/L (ref 38–126)
Anion gap: 14 (ref 5–15)
BUN: 11 mg/dL (ref 6–20)
CO2: 24 mmol/L (ref 22–32)
Calcium: 9.6 mg/dL (ref 8.9–10.3)
Chloride: 99 mmol/L — ABNORMAL LOW (ref 101–111)
Creatinine, Ser: 0.57 mg/dL — ABNORMAL LOW (ref 0.61–1.24)
GFR calc Af Amer: >60 mL/min (ref >60)
GFR calc non Af Amer: >60 mL/min (ref >60)
Glucose, Bld: 125 mg/dL — ABNORMAL HIGH (ref 65–99)
Potassium: 3.3 mmol/L — ABNORMAL LOW (ref 3.5–5.1)
Sodium: 137 mmol/L (ref 135–145)
Total Bilirubin: 1 mg/dL (ref 0.3–1.2)
Total Protein: 7.8 g/dL (ref 6.5–8.1)

## 2017-12-09 LAB — CBC WITH DIFFERENTIAL/PLATELET
Basophils Absolute: 0 10*3/uL (ref 0.0–0.1)
Basophils Relative: 0 %
Eosinophils Absolute: 0 10*3/uL (ref 0.0–0.7)
Eosinophils Relative: 0 %
HCT: 48 % (ref 39.0–52.0)
Hemoglobin: 16.2 g/dL (ref 13.0–17.0)
Lymphocytes Relative: 5 %
Lymphs Abs: 1.4 10*3/uL (ref 0.7–4.0)
MCH: 29.8 pg (ref 26.0–34.0)
MCHC: 33.8 g/dL (ref 30.0–36.0)
MCV: 88.2 fL (ref 78.0–100.0)
Monocytes Absolute: 1.6 10*3/uL (ref 0.1–1.0)
Monocytes Relative: 6 %
Neutro Abs: 22.4 10*3/uL (ref 1.7–7.7)
Neutrophils Relative %: 89 %
Platelets: 243 10*3/uL (ref 150–400)
RBC: 5.44 MIL/uL (ref 4.22–5.81)
RDW: 13.2 % (ref 11.5–15.5)
WBC: 25.4 10*3/uL — ABNORMAL HIGH (ref 4.0–10.5)

## 2017-12-09 LAB — URINALYSIS, ROUTINE W REFLEX MICROSCOPIC
Bilirubin Urine: NEGATIVE
Glucose, UA: NEGATIVE mg/dL
Ketones, ur: 5 mg/dL — AB
Nitrite: NEGATIVE
Protein, ur: NEGATIVE mg/dL
Specific Gravity, Urine: 1.011 (ref 1.005–1.030)
Squamous Epithelial / LPF: NONE SEEN
pH: 6 (ref 5.0–8.0)

## 2017-12-09 LAB — I-STAT CG4 LACTIC ACID, ED: Lactic Acid, Venous: 1.86 mmol/L (ref 0.5–1.9)

## 2017-12-09 LAB — PROCALCITONIN: Procalcitonin: 0.49 ng/mL

## 2017-12-09 MED ORDER — LORAZEPAM 2 MG/ML IJ SOLN
0.5000 mg | Freq: Once | INTRAMUSCULAR | Status: AC
  Administered 2017-12-09: 0.5 mg via INTRAVENOUS
  Filled 2017-12-09: qty 1

## 2017-12-09 MED ORDER — SODIUM CHLORIDE 0.9 % IV BOLUS (SEPSIS)
250.0000 mL | Freq: Once | INTRAVENOUS | Status: AC
  Administered 2017-12-09: 250 mL via INTRAVENOUS

## 2017-12-09 MED ORDER — AZTREONAM 2 G IJ SOLR: 2.0000 g | Freq: Once | INTRAMUSCULAR | Status: DC

## 2017-12-09 MED ORDER — ACETAMINOPHEN 500 MG PO TABS
1000.0000 mg | ORAL_TABLET | Freq: Once | ORAL | Status: AC
  Administered 2017-12-09: 1000 mg via ORAL
  Filled 2017-12-09: qty 2

## 2017-12-09 MED ORDER — SODIUM CHLORIDE 0.9 % IV BOLUS (SEPSIS)
1000.0000 mL | Freq: Once | INTRAVENOUS | Status: AC
  Administered 2017-12-09: 1000 mL via INTRAVENOUS

## 2017-12-09 MED ORDER — VANCOMYCIN HCL IN DEXTROSE 1-5 GM/200ML-% IV SOLN
1000.0000 mg | Freq: Once | INTRAVENOUS | Status: AC
  Administered 2017-12-09: 1000 mg via INTRAVENOUS
  Filled 2017-12-09: qty 200

## 2017-12-09 MED ORDER — LEVOFLOXACIN IN D5W 750 MG/150ML IV SOLN
750.0000 mg | Freq: Once | INTRAVENOUS | Status: AC
  Administered 2017-12-09: 750 mg via INTRAVENOUS
  Filled 2017-12-09: qty 150

## 2017-12-09 MED ORDER — SODIUM CHLORIDE 0.9 % IV SOLN
2.0000 g | Freq: Once | INTRAVENOUS | Status: AC
  Administered 2017-12-09: 2 g via INTRAVENOUS
  Filled 2017-12-09: qty 2

## 2017-12-09 NOTE — H&P (Signed)
[facility]                                                                                    Patient Demographics  Samuel [name], is a 57 y.o. male  CSN: [id number]  MRN: [id number]  DOB - [birth year]  Admit Date - 12/09/2017  Outpatient Primary MD for the patient is Barbie Banner, MD   With History of -  Past Medical History:  Diagnosis Date  . Bladder calculi   . Chronic back pain   . Chronic fatigue   . Foley catheter in place   . Gait disorder   . GERD (gastroesophageal reflux disease)   . Hyperlipidemia   . Hypertension   . Hypothyroidism   . Incontinence of urine   . Multiple sclerosis (HCC) NEUROLOGIST-- [name]   dx 1993, JC virus negative 2/14---  CURRENTLY RECEIVING TYSABRI IV TREATMENT  . Neurogenic bladder   . Neurogenic bowel    intermittant constipation and diarrhea  . Pneumonia    dx  03-03-2014--  admitted for iv and oral antibiotics  . Shortness of breath    secondary to fatique  . 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 Urinary retention   . Vitamin D deficiency   . Yeast infection    genital area      Past Surgical History:  Procedure Laterality Date  . APPENDECTOMY  age 32  . CYSTOSCOPY N/A 03/21/2014   Procedure: CYSTOSCOPY TREATMENT OF BLADDER CALCULI;  Surgeon: [name], MD;  Location: [facility];  Service: Urology;  Laterality: N/A;  . INSERTION OF SUPRAPUBIC CATHETER N/A 03/21/2014   Procedure: INSERTION OF SUPRAPUBIC CATHETER;  Surgeon: [name], MD;  Location: [facility];  Service: Urology;  Laterality: N/A;  . NASAL SEPTUM SURGERY  1996  . NEGATIVE SLEEP STUDY  1996  . TRANSTHORACIC ECHOCARDIOGRAM  02-07-2004   MILD LVH/  EF 55-65%    in for   Chief Complaint  Patient presents with  . Fever     HPI  Samuel [name]  is a 57 y.o. male, with past medical history with PMHx significant for multiple sclerosis,endocrine chronic indwelling suprapubic catheter bladder  calculi presenting with 2 days history of fever, nausea and vomiting  denies abdominal pain, chest pains or shortness of breath .foleyy catheter was changed yesterday by certified medical assistant at home.patient also has history of anxiety and is asking for Ativan when necessary. In the emergency room his urinalysis was positive and he was started on IV antibiotics and code sepsis was called. However his lactic acid was normal and his blood pressure remained normal.    Review of Systems    In addition to the HPI above,   No Headache, No changes with Vision or hearing, No problems swallowing food or Liquids, No Chest pain, Cough or Shortness of Breath, No Abdominal pain,  Bowel movements are irregular, No Blood in stool or Urine, No dysuria, No new skin rashes or bruises, No new joints pains-aches,  No new weakness, tingling, numbness in any extremity, No recent weight gain or loss, No polyuria, polydypsia or polyphagia, No significant Mental Stressors.  A full 10 point Review of Systems was done, except as stated above, all other Review of Systems were negative.   Social History Social History   Tobacco Use  . Smoking status: Former Smoker    Packs/day: 1.50    Years: 8.00    Pack years: 12.00    Types: Cigarettes    Last attempt to quit: 06/17/1985    Years since quitting: 32.5  . Smokeless tobacco: Never Used  Substance Use Topics  . Alcohol use: No    Alcohol/week: 0.0 oz     Family History Family History  Problem Relation Age of Onset  . Stroke Father   . Cirrhosis Brother   . Hypothyroidism Sister      Prior to Admission medications   Medication Sig Start Date End Date Taking? Authorizing Provider  acetic acid 0.25 % irrigation Irrigate with as directed daily. Instill 30ml and clamp tube for 30 minutes then drain. 06/03/15  Yes [name], MD  baclofen (LIORESAL) 20 MG tablet TAKE TWO TABLETS BY MOUTH THREE TIMES DAILY 04/09/17  Yes York Spaniel, MD   diazepam (VALIUM) 5 MG tablet Take 1 tablet (5 mg total) by mouth 3 (three) times daily. 11/27/17  Yes York Spaniel, MD  docusate sodium (COLACE) 250 MG capsule Take 250 mg by mouth daily.   Yes [provider]  HYDROcodone-acetaminophen (NORCO/VICODIN) 5-325 MG tablet Take 1 tablet by mouth every 6 (six) hours as needed. 05/21/16  Yes [name], NP  levETIRAcetam (KEPPRA) 500 MG tablet Take 1 tablet (500 mg total) by mouth 2 (two) times daily. 10/13/15  Yes Calvert Cantor, MD  levothyroxine (SYNTHROID, LEVOTHROID) 125 MCG tablet Take 125 mcg by mouth daily. 05/22/15  Yes [provider]  lisinopril-hydrochlorothiazide (PRINZIDE,ZESTORETIC) 10-12.5 MG tablet Take 0.5 tablets by mouth every morning. 09/05/15  Yes [provider]  mirabegron ER (MYRBETRIQ) 50 MG TB24 tablet Take 50 mg by mouth daily.   Yes [provider]  omeprazole (PRILOSEC) 20 MG capsule Take 20 mg by mouth every morning.    Yes [provider]  oxybutynin (DITROPAN XL) 15 MG 24 hr tablet Take 15 mg by mouth daily.  05/12/17  Yes [provider]  perphenazine (TRILAFON) 2 MG tablet TAKE 1 TABLET BY MOUTH TWICE DAILY 11/17/17  Yes York Spaniel, MD  phenazopyridine (PYRIDIUM) 97 MG tablet Take 97 mg by mouth 2 (two) times daily.   Yes [provider]  scopolamine (TRANSDERM-SCOP, 1.5 MG,) 1 MG/3DAYS Place 1 patch (1.5 mg total) onto the skin every 3 (three) days. 05/18/15  Yes York Spaniel, MD  tiZANidine (ZANAFLEX) 4 MG tablet TAKE TWO TABLETS BY MOUTH THREE TIMES DAILY 05/07/17  Yes York Spaniel, MD  traMADol (ULTRAM) 50 MG tablet TAKE 1 TABLET BY MOUTH EVERY 6 HOURS AS NEEDED Patient taking differently: TAKE 1 TABLET BY MOUTH EVERY 6 HOURS AS NEEDED FOR PAIN 06/05/17  Yes York Spaniel, MD  trazodone (DESYREL) 300 MG tablet TAKE 1 TABLET BY MOUTH ONCE DAILY AT BEDTIME 11/24/17  Yes York Spaniel, MD  venlafaxine XR (EFFEXOR-XR) 75 MG 24 hr capsule  TAKE 2 CAPSULES BY MOUTH ONCE DAILY WITH BREAKFAST 11/10/17  Yes York Spaniel, MD  Vitamin D, Ergocalciferol, (DRISDOL) 50000 UNITS CAPS Take 50,000 Units by mouth every 7 (seven) days. Friday only   Yes [provider]    Allergies  Allergen Reactions  . Penicillins Other (See Comments)    Unknown childhoood reaction (  takes amoxicillian with reaction) Has tolerated ceftriaxone, cefepime, and Primaxin during inpatient admissions    Physical Exam  Vitals  Blood pressure 140/89, pulse (!) 118, temperature (!) 102 F (38.9 C), temperature source Rectal, resp. rate (!) 30, SpO2 99 %.   1. General chronically ill male,contracted, looks acutely ill  2. flat affect and insight, Not Suicidal or Homicidal, Awake Alert, Oriented X 3.  3. No F.N deficits, grossly..  4. Ears and Eyes appear Normal, Conjunctivae clear, PERRLA. Moist Oral Mucosa.  5. Supple Neck, No JVD, No cervical lymphadenopathy appriciated, No Carotid Bruits.  6. Symmetrical Chest wall movement, Good air movement bilaterally, CTAB.  7. RRR, tachycardic, No Gallops, Rubs or Murmurs, No Parasternal Heave.  8. Positive Bowel Sounds, Abdomen Soft, Non tender, No organomegaly appriciated,No rebound -guarding or rigidity.uprapubic catheter noted  9.  No Cyanosis, Normal Skin Turgor, No Skin Rash or Bruise.  10. Good muscle tone,  joints appear contracted.    Data Review  CBC Recent Labs  Lab 12/09/17 1941  WBC 25.4*  HGB 16.2  HCT 48.0  PLT 243  MCV 88.2  MCH 29.8  MCHC 33.8  RDW 13.2  LYMPHSABS 1.4  MONOABS 1.6  EOSABS 0.0  BASOSABS 0.0   ------------------------------------------------------------------------------------------------------------------  Chemistries  Recent Labs  Lab 12/09/17 1941  NA 137  K 3.3*  CL 99*  CO2 24  GLUCOSE 125*  BUN 11  CREATININE 0.57*  CALCIUM 9.6  AST 23  ALT 31  ALKPHOS 120  BILITOT 1.0    ------------------------------------------------------------------------------------------------------------------ CrCl cannot be calculated (Unknown ideal weight.). ------------------------------------------------------------------------------------------------------------------ No results for input(s): TSH, T4TOTAL, T3FREE, THYROIDAB in the last 72 hours.  Invalid input(s): FREET3   Coagulation profile No results for input(s): INR, PROTIME in the last 168 hours. ------------------------------------------------------------------------------------------------------------------- No results for input(s): DDIMER in the last 72 hours. -------------------------------------------------------------------------------------------------------------------  Cardiac Enzymes No results for input(s): CKMB, TROPONINI, MYOGLOBIN in the last 168 hours.  Invalid input(s): CK ------------------------------------------------------------------------------------------------------------------ Invalid input(s): POCBNP   ---------------------------------------------------------------------------------------------------------------  Urinalysis    Component Value Date/Time   COLORURINE YELLOW 12/09/2017 2038   APPEARANCEUR HAZY (A) 12/09/2017 2038   LABSPEC 1.011 12/09/2017 2038   PHURINE 6.0 12/09/2017 2038   GLUCOSEU NEGATIVE 12/09/2017 2038   HGBUR LARGE (A) 12/09/2017 2038   BILIRUBINUR NEGATIVE 12/09/2017 2038   KETONESUR 5 (A) 12/09/2017 2038   PROTEINUR NEGATIVE 12/09/2017 2038   UROBILINOGEN 0.2 06/02/2015 0459   NITRITE NEGATIVE 12/09/2017 2038   LEUKOCYTESUR LARGE (A) 12/09/2017 2038    ----------------------------------------------------------------------------------------------------------------   Imaging results:   Dg Chest 1 View  Result Date: 12/09/2017 CLINICAL DATA:  Nausea and vomiting EXAM: CHEST  1 VIEW COMPARISON:  10/10/2015 FINDINGS: Low lung volumes. No consolidation or  effusion. Normal heart size. No pneumothorax. IMPRESSION: No active disease.  Low lung volumes. Electronically Signed   By: Jasmine Pang M.D.   On: 12/09/2017 20:34    My personal review of EKG: sinus tachycardia at 1 22 bpm    Assessment & Plan  1. UTI with a history of Chronic UTI 's status post suprapubic catheter changed yesterday . 2. Sepsis 3 .history of multiple sclerosis 4 . Chronic anxiety  Plan  Admit to telemetry IV antibiotics vancomycin and cefepime Cultures obtained IV fluids   DVT Prophylaxis Lovenox  AM Labs Ordered, also please review Full Orders    Code Status full  Disposition Plan: home with home health  Time spent in minutes : 43 minutes  Condition GUARDED   @SIGNATURE @

## 2017-12-09 NOTE — ED Triage Notes (Signed)
Pt brought in from home via EMS  Pt has MS  Pt is c/o fever, nausea and vomiting that started this morning  Pt has a foley cath in place draining dark urine  Pt is alert and oriented x 4

## 2017-12-09 NOTE — ED Provider Notes (Signed)
[facility]-EMERGENCY DEPT Provider Note   CSN: [id number] Arrival date & time: 12/09/17  1908     History   Chief Complaint Chief Complaint  Patient presents with  . Fever    HPI Samuel [name] is a 57 y.o. male who is BIB EMS for for complaint of fever, nausea and vomiting.  Patient is chronically ill with a suprapubic catheter.  He is not ambulatory.  He has a history of MS.  He has a history of recurrent urinary tract infections.  Patient awoke this morning with elevated temperature generalized malaise and body aches.  He had nausea and several episodes of nonbloody nonbilious vomitus along with ascending temperature.  He has not taken any Motrin or Tylenol.  He complains of no nausea at this time.  He has no history of bedsores.  He lives at home with his mother and has frequent help with from caregivers.  He denies cough.  HPI  Past Medical History:  Diagnosis Date  . Bladder calculi   . Chronic back pain   . Chronic fatigue   . Foley catheter in place   . Gait disorder   . GERD (gastroesophageal reflux disease)   . Hyperlipidemia   . Hypertension   . Hypothyroidism   . Incontinence of urine   . Multiple sclerosis (HCC) NEUROLOGIST-- [name]   dx 1993, JC virus negative 2/14---  CURRENTLY RECEIVING TYSABRI IV TREATMENT  . Neurogenic bladder   . Neurogenic bowel    intermittant constipation and diarrhea  . Pneumonia    dx  03-03-2014--  admitted for iv and oral antibiotics  . Shortness of breath    secondary to fatique  . 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 Urinary retention   . Vitamin D deficiency   . Yeast infection    genital area    Patient Active Problem List   Diagnosis Date Noted  . Seizure (HCC)   . UTI (lower urinary tract infection) 10/10/2015  . Essential hypertension 10/10/2015  . Nausea & vomiting 10/10/2015  . Malfunction of Foley catheter (HCC)   . Malnutrition of moderate degree (HCC) 07/14/2014  . History of  ESBL Klebsiella pneumoniae infection 07/14/2014  . UTI (urinary tract infection) 07/10/2014  . Neurogenic bladder 03/21/2014  . Acute retention of urine 03/03/2014  . PNA (pneumonia) 03/02/2014  . Pneumonia 03/02/2014  . Multiple sclerosis (HCC) 04/19/2013  . 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 APPENDECTOMY  age 45  . CYSTOSCOPY N/A 03/21/2014   Procedure: CYSTOSCOPY TREATMENT OF BLADDER CALCULI;  Surgeon: [name], MD;  Location: [facility];  Service: Urology;  Laterality: N/A;  . INSERTION OF SUPRAPUBIC CATHETER N/A 03/21/2014   Procedure: INSERTION OF SUPRAPUBIC CATHETER;  Surgeon: [name], MD;  Location: [facility];  Service: Urology;  Laterality: N/A;  . NASAL SEPTUM SURGERY  1996  . NEGATIVE SLEEP STUDY  1996  . TRANSTHORACIC ECHOCARDIOGRAM  02-07-2004   MILD LVH/  EF 55-65%        Home Medications    Prior to Admission medications   Medication Sig Start Date End Date Taking? Authorizing Provider  acetic acid 0.25 % irrigation Irrigate with as directed daily. Instill 30ml and clamp tube for 30 minutes then drain. 06/03/15   [name], MD  baclofen (LIORESAL) 20 MG tablet TAKE TWO TABLETS BY MOUTH THREE TIMES DAILY 04/09/17  York Spaniel, MD  diazepam (VALIUM) 5 MG tablet Take 1 tablet (5 mg total) by mouth 3 (three) times daily. 11/27/17   York Spaniel, MD  HYDROcodone-acetaminophen (NORCO/VICODIN) 5-325 MG tablet Take 1 tablet by mouth every 6 (six) hours as needed. 05/21/16   [name], NP  levETIRAcetam (KEPPRA) 500 MG tablet Take 1 tablet (500 mg total) by mouth 2 (two) times daily. 10/13/15   Calvert Cantor, MD  levothyroxine (SYNTHROID, LEVOTHROID) 125 MCG tablet Take 125 mcg by mouth daily. 05/22/15   [provider]  lisinopril-hydrochlorothiazide (PRINZIDE,ZESTORETIC) 10-12.5 MG tablet Take 0.5 tablets by mouth every  morning. 09/05/15   [provider]  mirabegron ER (MYRBETRIQ) 50 MG TB24 tablet Take 50 mg by mouth daily.    [provider]  omeprazole (PRILOSEC) 20 MG capsule Take 20 mg by mouth every morning.     [provider]  OnabotulinumtoxinA (BOTOX IJ) Inject 300 Units as directed every 3 (three) months. Administered at MD office    [provider]  oxybutynin (DITROPAN XL) 15 MG 24 hr tablet Take 15 mg by mouth. 05/12/17   [provider]  perphenazine (TRILAFON) 2 MG tablet TAKE 1 TABLET BY MOUTH TWICE DAILY 11/17/17   York Spaniel, MD  scopolamine (TRANSDERM-SCOP, 1.5 MG,) 1 MG/3DAYS Place 1 patch (1.5 mg total) onto the skin every 3 (three) days. 05/18/15   York Spaniel, MD  solifenacin (VESICARE) 10 MG tablet Take 10 mg by mouth daily.    [provider]  tiZANidine (ZANAFLEX) 4 MG tablet TAKE TWO TABLETS BY MOUTH THREE TIMES DAILY 05/07/17   York Spaniel, MD  traMADol (ULTRAM) 50 MG tablet TAKE 1 TABLET BY MOUTH EVERY 6 HOURS AS NEEDED 06/05/17   York Spaniel, MD  trazodone (DESYREL) 300 MG tablet TAKE 1 TABLET BY MOUTH ONCE DAILY AT BEDTIME 11/24/17   York Spaniel, MD  venlafaxine XR (EFFEXOR-XR) 75 MG 24 hr capsule TAKE 2 CAPSULES BY MOUTH ONCE DAILY WITH BREAKFAST 11/10/17   York Spaniel, MD  Vitamin D, Ergocalciferol, (DRISDOL) 50000 UNITS CAPS Take 50,000 Units by mouth every 7 (seven) days. Friday only    [provider]    Family History Family History  Problem Relation Age of Onset  . Stroke Father   . Cirrhosis Brother   . Hypothyroidism Sister     Social History Social History   Tobacco Use  . Smoking status: Former Smoker    Packs/day: 1.50    Years: 8.00    Pack years: 12.00    Types: Cigarettes    Last attempt to quit: 06/17/1985    Years since quitting: 32.5  . Smokeless tobacco: Never Used  Substance Use Topics  . Alcohol use: No    Alcohol/week: 0.0 oz  . Drug use: No      Allergies   Penicillins   Review of Systems Review of Systems Ten systems reviewed and are negative for acute change, except as noted in the HPI.   Physical Exam. Updated Vital Signs BP (!) 138/91 (BP Location: Left Arm)   Pulse (!) 121   Temp 99.9 F (37.7 C) (Oral)   Resp 18   SpO2 95%   Physical Exam  Constitutional: No distress.  Appears chronically ill, multiple muscle atrophy noted with spastic hemiparesis of the R arm  HENT:  Head: Normocephalic and atraumatic.  Eyes: Pupils are equal, round, and reactive to light. Conjunctivae and EOM are normal. No scleral  icterus.  Neck: Normal range of motion. Neck supple.  Cardiovascular: Normal rate, regular rhythm and normal heart sounds.  Pulmonary/Chest: Effort normal and breath sounds normal. No respiratory distress. He has no wheezes.  Abdominal: Soft. He exhibits no distension. There is no tenderness.  Suprapubic catheter in place  Musculoskeletal: He exhibits no edema.  Neurological: He is alert.  Skin: Skin is warm and dry. He is not diaphoretic.  Psychiatric: His behavior is normal.  Nursing note and vitals reviewed.    ED Treatments / Results  Labs (all labs ordered are listed, but only abnormal results are displayed) Labs Reviewed  CULTURE, BLOOD (ROUTINE X 2)  CULTURE, BLOOD (ROUTINE X 2)  COMPREHENSIVE METABOLIC PANEL  CBC WITH DIFFERENTIAL/PLATELET  URINALYSIS, ROUTINE W REFLEX MICROSCOPIC  PROCALCITONIN  I-STAT CG4 LACTIC ACID, ED    EKG EKG Interpretation  Date/Time:  Tuesday December 09 2017 19:26:17 EDT Ventricular Rate:  122 PR Interval:    QRS Duration: 77 QT Interval:  382 QTC Calculation: 545 R Axis:   39 Text Interpretation:  Sinus tachycardia Consider right atrial enlargement Borderline repolarization abnormality Prolonged QT interval Baseline wander in lead(s) V3 V4 Since last tracing rate faster Confirmed by Jacalyn Lefevre [phone]) on 12/09/2017 7:29:47 PM   Radiology No results  found.  Procedures Procedures (including critical care time)  Medications Ordered in ED Medications  acetaminophen (TYLENOL) tablet 1,000 mg (has no administration in time range)  sodium chloride 0.9 % bolus 1,000 mL (has no administration in time range)    And  sodium chloride 0.9 % bolus 1,000 mL (has no administration in time range)    And  sodium chloride 0.9 % bolus 250 mL (has no administration in time range)  levofloxacin (LEVAQUIN) IVPB 750 mg (has no administration in time range)  vancomycin (VANCOCIN) IVPB 1000 mg/200 mL premix (has no administration in time range)     Initial Impression / Assessment and Plan / ED Course  I have reviewed the triage vital signs and the nursing notes.  Pertinent labs & imaging results that were available during my care of the patient were reviewed by me and considered in my medical decision making (see chart for details).  Clinical Course as of Dec 09 2121  Tue Dec 09, 2017  2042 DG Chest 1 View [AH]  2106 WBC(!): 25.4 [AH]  2107 Lactic has not yet been collected. Will update nursing to the urgency of this test   I-Stat CG4 Lactic Acid, ED  (not at  [facility]) [AH]    Clinical Course User Index [AH] Arthor Captain, PA-C    Patient with fever, urine positive for infection.  Patient be admitted to the hospitalist service.  Being treated for sepsis with broad-spectrum antibiotics.  Stable throughout his ED visit.  Final Clinical Impressions(s) / ED Diagnoses   Final diagnoses:  None    ED Discharge Orders    None       Arthor Captain, PA-C 12/10/17 6045    Jacalyn Lefevre, MD 12/12/17 [phone]

## 2017-12-09 NOTE — ED Notes (Signed)
EKG given to EDP,Haviland,MD., for review. 

## 2017-12-09 NOTE — Progress Notes (Signed)
Pharmacy Note   A consult was received from an ED physician for vancomycin, cefepime and levofloxacin per pharmacy dosing.  The patient's profile has been reviewed for ht/wt/allergies/indication/available labs.    A one time order has been placed for vancomycin 1000 mg IV x1, levofloxacin 750 mg IV x1 and cefepime 2 gr IV x1   Further antibiotics/pharmacy consults should be ordered by admitting physician if indicated.                       Thank you,   Adalberto Cole, PharmD, BCPS Pager [phone] 12/09/2017 7:33 PM

## 2017-12-09 NOTE — ED Notes (Signed)
ED TO INPATIENT HANDOFF REPORT  Name/Age/Gender Samuel [name] 57 y.o. male  Code Status Code Status History    Date Active Date Inactive Code Status Order ID Comments User Context   10/10/2015 2306 10/13/2015 1911 Full Code [id number]  Rise Patience, MD Inpatient   06/02/2015 0904 06/05/2015 2257 Full Code [id number]  Reyne Dumas, MD ED   07/10/2014 1322 07/14/2014 2050 Full Code [id number]  Bonnielee Haff, MD Inpatient   03/02/2014 2236 03/03/2014 1750 Full Code [id number]  Phillips Grout, MD Inpatient      Home/SNF/Other    Chief Complaint Fever  Level of Care/Admitting Diagnosis ED Disposition    ED Disposition Condition [place] [facility]: [facility] [[id number]]  Level of Care: Telemetry [5]  Admit to tele based on following criteria: Other see comments  Comments: Sepsis  Diagnosis: Sepsis [facility]) [[id number]]  Admitting Physician: Merton Border Marshal.Browner  Attending Physician: Laren Everts, [place]  Estimated length of stay: past midnight tomorrow  Certification:: I certify this patient will need inpatient services for at least 2 midnights  PT Class (Do Not Modify): Inpatient [101]  PT Acc Code (Do Not Modify): Private [1]       Medical History Past Medical History:  Diagnosis Date  . Bladder calculi   . Chronic back pain   . Chronic fatigue   . Foley catheter in place   . Gait disorder   . GERD (gastroesophageal reflux disease)   . Hyperlipidemia   . Hypertension   . Hypothyroidism   . Incontinence of urine   . Multiple sclerosis ([place]) NEUROLOGIST-- DR Jannifer Franklin   dx 1993, JC virus negative 2/14---  CURRENTLY RECEIVING TYSABRI IV TREATMENT  . Neurogenic bladder   . Neurogenic bowel    intermittant constipation and diarrhea  . Pneumonia    dx  03-03-2014--  admitted for iv and oral antibiotics  . Shortness of breath    secondary to fatique  . Spastic quadriparesis ([place]) 04/19/2013  . Urinary retention   . Vitamin D deficiency   . Yeast  infection    genital area    Allergies Allergies  Allergen Reactions  . Penicillins Other (See Comments)    Unknown childhoood reaction (takes amoxicillian with reaction) Has tolerated ceftriaxone, cefepime, and Primaxin during inpatient admissions    IV Location/Drains/Wounds Patient Lines/Drains/Airways Status   Active Line/Drains/Airways    Name:   Placement date:   Placement time:   Site:   Days:   Peripheral IV Left Hand   -    -    Hand      Peripheral IV 12/09/17 Left Wrist   12/09/17    1930    Wrist   less than 1   Peripheral IV 12/09/17 Right Hand   12/09/17    1935    Hand   less than 1   Suprapubic Catheter Triple-lumen 22 Fr.   11/12/16    1917    Triple-lumen   392   Incision (Closed) 03/21/14 Abdomen Other (Comment)   03/21/14    1030     1359          Labs/Imaging Results for orders placed or performed during the hospital encounter of 12/09/17 (from the past 48 hour(s))  Comprehensive metabolic panel     Status: Abnormal   Collection Time: 12/09/17  7:41 PM  Result Value Ref Range   Sodium 137 135 - 145 mmol/L   Potassium 3.3 (L) 3.5 - 5.1  mmol/L   Chloride 99 (L) 101 - 111 mmol/L   CO2 24 22 - 32 mmol/L   Glucose, Bld 125 (H) 65 - 99 mg/dL   BUN 11 6 - 20 mg/dL   Creatinine, Ser 0.57 (L) 0.61 - 1.24 mg/dL   Calcium 9.6 8.9 - 10.3 mg/dL   Total Protein 7.8 6.5 - 8.1 g/dL   Albumin 4.2 3.5 - 5.0 g/dL   AST 23 15 - 41 U/L   ALT 31 17 - 63 U/L   Alkaline Phosphatase 120 38 - 126 U/L   Total Bilirubin 1.0 0.3 - 1.2 mg/dL   GFR calc non Af Amer >60 >60 mL/min   GFR calc Af Amer >60 >60 mL/min    Comment: (NOTE) The eGFR has been calculated using the CKD EPI equation. This calculation has not been validated in all clinical situations. eGFR's persistently <60 mL/min signify possible Chronic Kidney Disease.    Anion gap 14 5 - 15    Comment: Performed at [facility], [place] [address] [place], [place] [address]  CBC WITH DIFFERENTIAL      Status: Abnormal   Collection Time: 12/09/17  7:41 PM  Result Value Ref Range   WBC 25.4 (H) 4.0 - 10.5 K/uL   RBC 5.44 4.22 - 5.81 MIL/uL   Hemoglobin 16.2 13.0 - 17.0 g/dL   HCT 48.0 39.0 - 52.0 %   MCV 88.2 78.0 - 100.0 fL   MCH 29.8 26.0 - 34.0 pg   MCHC 33.8 30.0 - 36.0 g/dL   RDW 13.2 11.5 - 15.5 %   Platelets 243 150 - 400 K/uL   Neutrophils Relative % 89 %   Neutro Abs 22.4 1.7 - 7.7 K/uL   Lymphocytes Relative 5 %   Lymphs Abs 1.4 0.7 - 4.0 K/uL   Monocytes Relative 6 %   Monocytes Absolute 1.6 0.1 - 1.0 K/uL   Eosinophils Relative 0 %   Eosinophils Absolute 0.0 0.0 - 0.7 K/uL   Basophils Relative 0 %   Basophils Absolute 0.0 0.0 - 0.1 K/uL   WBC Morphology WHITE COUNT CONFIRMED ON SMEAR     Comment: Performed at [facility], [place] [address], [place], [place] [address]  Procalcitonin     Status: None   Collection Time: 12/09/17  7:41 PM  Result Value Ref Range   Procalcitonin 0.49 ng/mL    Comment:        Interpretation: PCT (Procalcitonin) <= 0.5 ng/mL: Systemic infection (sepsis) is not likely. Local bacterial infection is possible. (NOTE)       Sepsis PCT Algorithm           Lower Respiratory Tract                                      Infection PCT Algorithm    ----------------------------     ----------------------------         PCT < 0.25 ng/mL                PCT < 0.10 ng/mL         Strongly encourage             Strongly discourage   discontinuation of antibiotics    initiation of antibiotics    ----------------------------     -----------------------------       PCT 0.25 - 0.50 ng/mL  PCT 0.10 - 0.25 ng/mL               OR       >80% decrease in PCT            Discourage initiation of                                            antibiotics      Encourage discontinuation           of antibiotics    ----------------------------     -----------------------------         PCT >= 0.50 ng/mL              PCT 0.26 - 0.50 ng/mL                AND        <80% decrease in PCT             Encourage initiation of                                             antibiotics       Encourage continuation           of antibiotics    ----------------------------     -----------------------------        PCT >= 0.50 ng/mL                  PCT > 0.50 ng/mL               AND         increase in PCT                  Strongly encourage                                      initiation of antibiotics    Strongly encourage escalation           of antibiotics                                     -----------------------------                                           PCT <= 0.25 ng/mL                                                 OR                                        > 80% decrease in PCT  Discontinue / Do not initiate                                             antibiotics Performed at [place] [address], [place], [place] [address]   Urinalysis, Routine w reflex microscopic     Status: Abnormal   Collection Time: 12/09/17  8:38 PM  Result Value Ref Range   Color, Urine YELLOW YELLOW   APPearance HAZY (A) CLEAR   Specific Gravity, Urine 1.011 1.005 - 1.030   pH 6.0 5.0 - 8.0   Glucose, UA NEGATIVE NEGATIVE mg/dL   Hgb urine dipstick LARGE (A) NEGATIVE   Bilirubin Urine NEGATIVE NEGATIVE   Ketones, ur 5 (A) NEGATIVE mg/dL   Protein, ur NEGATIVE NEGATIVE mg/dL   Nitrite NEGATIVE NEGATIVE   Leukocytes, UA LARGE (A) NEGATIVE   RBC / HPF 6-30 0 - 5 RBC/hpf   WBC, UA TOO NUMEROUS TO COUNT 0 - 5 WBC/hpf   Bacteria, UA FEW (A) NONE SEEN   Squamous Epithelial / LPF NONE SEEN NONE SEEN   Mucus PRESENT    Amorphous Crystal PRESENT     Comment: Performed at [facility], [place] [address] [place], [place] [address]  I-Stat CG4 Lactic Acid, ED  (not at  [facility])     Status: None   Collection Time: 12/09/17  9:27 PM  Result Value Ref Range   Lactic Acid,  Venous 1.86 0.5 - 1.9 mmol/L   Dg Chest 1 View  Result Date: 12/09/2017 CLINICAL DATA:  Nausea and vomiting EXAM: CHEST  1 VIEW COMPARISON:  10/10/2015 FINDINGS: Low lung volumes. No consolidation or effusion. Normal heart size. No pneumothorax. IMPRESSION: No active disease.  Low lung volumes. Electronically Signed   By: Donavan Foil M.D.   On: 12/09/2017 20:34    Pending Labs Unresulted Labs (From admission, onward)   Start     Ordered   12/09/17 1916  Blood Culture (routine x 2)  BLOOD CULTURE X 2,   STAT     12/09/17 1918   Signed and Held  HIV antibody (Routine Testing)  Once,   R     Signed and Held   Signed and Held  Creatinine, serum  (enoxaparin (LOVENOX)    CrCl >/= 30 ml/min)  Weekly,   R    Comments:  while on enoxaparin therapy    Signed and Held   Signed and Held  CBC  Tomorrow morning,   R     Signed and Held   Signed and Held  Basic metabolic panel  Tomorrow morning,   R     Signed and Held      Vitals/Pain Today's Vitals   12/09/17 2200 12/09/17 2230 12/09/17 2300 12/09/17 2306  BP: (!) 142/89 (!) 157/91 (!) 158/100 (!) 158/100  Pulse: (!) 116 (!) 130 (!) 137 (!) 133  Resp: (!) 35 (!) 25 (!) 33 (!) 24  Temp:    99.4 F (37.4 C)  TempSrc:    Oral  SpO2: 97% 92% (!) 89% 93%  PainSc:        Isolation Precautions No active isolations  Medications Medications  acetaminophen (TYLENOL) tablet 1,000 mg (1,000 mg Oral Given 12/09/17 1944)  sodium chloride 0.9 % bolus 1,000 mL (0 mLs Intravenous Stopped 12/09/17 2125)    And  sodium chloride 0.9 % bolus  1,000 mL (0 mLs Intravenous Stopped 12/09/17 2015)    And  sodium chloride 0.9 % bolus 250 mL (0 mLs Intravenous Stopped 12/09/17 2307)  levofloxacin (LEVAQUIN) IVPB 750 mg (0 mg Intravenous Stopped 12/09/17 2125)  vancomycin (VANCOCIN) IVPB 1000 mg/200 mL premix (0 mg Intravenous Stopped 12/09/17 2145)  ceFEPIme (MAXIPIME) 2 g in sodium chloride 0.9 % 100 mL IVPB (0 g Intravenous Stopped 12/09/17 2015)  LORazepam (ATIVAN)  injection 0.5 mg (0.5 mg Intravenous Given 12/09/17 2303)    Mobility non-ambulatory

## 2017-12-09 NOTE — ED Notes (Addendum)
Pt states he woke up this morning with nausea and vomiting and later today started running a elevated temperature  Skin is hot to touch  Pt is alert and oriented x 3   Monitor reading sinus tach in the low 100s  Resp even and nonlabored  Pt has a suprapubic catheter in place from home draining dark amber colored urine with a strong odor  Pt has multiple sclerosis, nonambulatory  Foot drop noted bilaterally, legs stiff  RIght arm drawn up  Pt has redness noted to his sacral area with a dime sized area noted

## 2017-12-10 ENCOUNTER — Inpatient Hospital Stay (HOSPITAL_COMMUNITY): Payer: Medicare Other

## 2017-12-10 DIAGNOSIS — A419 Sepsis, unspecified organism: Secondary | ICD-10-CM

## 2017-12-10 LAB — BASIC METABOLIC PANEL
Anion gap: 9 (ref 5–15)
BUN: 10 mg/dL (ref 6–20)
CO2: 25 mmol/L (ref 22–32)
Calcium: 8.8 mg/dL — ABNORMAL LOW (ref 8.9–10.3)
Chloride: 106 mmol/L (ref 101–111)
Creatinine, Ser: 0.72 mg/dL (ref 0.61–1.24)
GFR calc Af Amer: >60 mL/min (ref >60)
GFR calc non Af Amer: >60 mL/min (ref >60)
Glucose, Bld: 162 mg/dL — ABNORMAL HIGH (ref 65–99)
Potassium: 3.5 mmol/L (ref 3.5–5.1)
Sodium: 140 mmol/L (ref 135–145)

## 2017-12-10 LAB — BLOOD CULTURE ID PANEL (REFLEXED)

## 2017-12-10 LAB — CBC
HCT: 39.8 % (ref 39.0–52.0)
Hemoglobin: 13.2 g/dL (ref 13.0–17.0)
MCH: 29.5 pg (ref 26.0–34.0)
MCHC: 33.2 g/dL (ref 30.0–36.0)
MCV: 88.8 fL (ref 78.0–100.0)
Platelets: 214 10*3/uL (ref 150–400)
RBC: 4.48 MIL/uL (ref 4.22–5.81)
RDW: 13.2 % (ref 11.5–15.5)
WBC: 21.5 10*3/uL — ABNORMAL HIGH (ref 4.0–10.5)

## 2017-12-10 LAB — HIV ANTIBODY (ROUTINE TESTING W REFLEX): HIV Screen 4th Generation wRfx: NONREACTIVE

## 2017-12-10 MED ORDER — PERPHENAZINE 2 MG PO TABS
2.0000 mg | ORAL_TABLET | Freq: Two times a day (BID) | ORAL | Status: DC
  Administered 2017-12-10 – 2017-12-12 (×5): 2 mg via ORAL
  Filled 2017-12-10 (×6): qty 1

## 2017-12-10 MED ORDER — SODIUM CHLORIDE 0.9 % IV SOLN
1.0000 g | Freq: Three times a day (TID) | INTRAVENOUS | Status: DC
  Administered 2017-12-10 – 2017-12-12 (×6): 1 g via INTRAVENOUS
  Filled 2017-12-10 (×8): qty 1

## 2017-12-10 MED ORDER — ACETIC ACID 0.25 % IR SOLN: Status: DC | PRN

## 2017-12-10 MED ORDER — BACLOFEN 20 MG PO TABS
40.0000 mg | ORAL_TABLET | Freq: Three times a day (TID) | ORAL | Status: DC
  Administered 2017-12-10 – 2017-12-12 (×8): 40 mg via ORAL
  Filled 2017-12-10 (×8): qty 2

## 2017-12-10 MED ORDER — ZOLPIDEM TARTRATE 5 MG PO TABS
5.0000 mg | ORAL_TABLET | Freq: Every evening | ORAL | Status: DC | PRN
Start: 2017-12-10 — End: 2017-12-12

## 2017-12-10 MED ORDER — DIAZEPAM 5 MG PO TABS
5.0000 mg | ORAL_TABLET | Freq: Three times a day (TID) | ORAL | Status: DC
  Administered 2017-12-10 – 2017-12-12 (×7): 5 mg via ORAL
  Filled 2017-12-10 (×7): qty 1

## 2017-12-10 MED ORDER — PANTOPRAZOLE SODIUM 40 MG PO TBEC
40.0000 mg | DELAYED_RELEASE_TABLET | Freq: Every day | ORAL | Status: DC
  Administered 2017-12-10 – 2017-12-12 (×3): 40 mg via ORAL
  Filled 2017-12-10 (×3): qty 1

## 2017-12-10 MED ORDER — TRAZODONE HCL 100 MG PO TABS
300.0000 mg | ORAL_TABLET | Freq: Every day | ORAL | Status: DC
  Administered 2017-12-10 – 2017-12-11 (×3): 300 mg via ORAL
  Filled 2017-12-10 (×3): qty 3

## 2017-12-10 MED ORDER — SODIUM CHLORIDE 0.9 % IV SOLN
INTRAVENOUS | Status: DC
  Administered 2017-12-10 – 2017-12-11 (×3): via INTRAVENOUS

## 2017-12-10 MED ORDER — ACETAMINOPHEN 650 MG RE SUPP: 650.0000 mg | Freq: Four times a day (QID) | RECTAL | Status: DC | PRN

## 2017-12-10 MED ORDER — HYDROCHLOROTHIAZIDE 10 MG/ML ORAL SUSPENSION
6.2500 mg | Freq: Every day | ORAL | Status: DC
  Administered 2017-12-10 – 2017-12-12 (×3): 6.25 mg via ORAL
  Filled 2017-12-10 (×3): qty 1.25

## 2017-12-10 MED ORDER — ENOXAPARIN SODIUM 40 MG/0.4ML ~~LOC~~ SOLN
40.0000 mg | Freq: Every day | SUBCUTANEOUS | Status: DC
  Administered 2017-12-10 – 2017-12-11 (×3): 40 mg via SUBCUTANEOUS
  Filled 2017-12-10 (×3): qty 0.4

## 2017-12-10 MED ORDER — MIRABEGRON ER 25 MG PO TB24
50.0000 mg | ORAL_TABLET | Freq: Every day | ORAL | Status: DC
  Administered 2017-12-10 – 2017-12-12 (×3): 50 mg via ORAL
  Filled 2017-12-10 (×3): qty 2

## 2017-12-10 MED ORDER — ONDANSETRON HCL 4 MG PO TABS: 4.0000 mg | ORAL_TABLET | Freq: Four times a day (QID) | ORAL | Status: DC | PRN

## 2017-12-10 MED ORDER — OXYBUTYNIN CHLORIDE ER 5 MG PO TB24
15.0000 mg | ORAL_TABLET | Freq: Every day | ORAL | Status: DC
  Administered 2017-12-10 – 2017-12-12 (×3): 15 mg via ORAL
  Filled 2017-12-10 (×3): qty 3

## 2017-12-10 MED ORDER — TRAMADOL HCL 50 MG PO TABS: 50.0000 mg | ORAL_TABLET | Freq: Four times a day (QID) | ORAL | Status: DC | PRN

## 2017-12-10 MED ORDER — LISINOPRIL-HYDROCHLOROTHIAZIDE 10-12.5 MG PO TABS: 0.5000 | ORAL_TABLET | Freq: Every morning | ORAL | Status: DC

## 2017-12-10 MED ORDER — SCOPOLAMINE 1 MG/3DAYS TD PT72: 1.0000 | MEDICATED_PATCH | TRANSDERMAL | Status: DC

## 2017-12-10 MED ORDER — SODIUM CHLORIDE 0.9 % IV SOLN
1.0000 g | Freq: Three times a day (TID) | INTRAVENOUS | Status: DC
  Administered 2017-12-10: 1 g via INTRAVENOUS
  Filled 2017-12-10 (×3): qty 1

## 2017-12-10 MED ORDER — ACETAMINOPHEN 325 MG PO TABS: 650.0000 mg | ORAL_TABLET | Freq: Four times a day (QID) | ORAL | Status: DC | PRN

## 2017-12-10 MED ORDER — LISINOPRIL 5 MG PO TABS
5.0000 mg | ORAL_TABLET | Freq: Every day | ORAL | Status: DC
  Administered 2017-12-10 – 2017-12-12 (×3): 5 mg via ORAL
  Filled 2017-12-10 (×3): qty 1

## 2017-12-10 MED ORDER — LEVETIRACETAM 500 MG PO TABS
500.0000 mg | ORAL_TABLET | Freq: Two times a day (BID) | ORAL | Status: DC
  Administered 2017-12-10: 500 mg via ORAL
  Filled 2017-12-10 (×2): qty 1

## 2017-12-10 MED ORDER — VENLAFAXINE HCL ER 75 MG PO CP24
75.0000 mg | ORAL_CAPSULE | Freq: Every day | ORAL | Status: DC
  Administered 2017-12-10 – 2017-12-12 (×3): 75 mg via ORAL
  Filled 2017-12-10 (×3): qty 1

## 2017-12-10 MED ORDER — DOCUSATE SODIUM 50 MG PO CAPS
250.0000 mg | ORAL_CAPSULE | Freq: Every day | ORAL | Status: DC
  Administered 2017-12-10 – 2017-12-12 (×3): 250 mg via ORAL
  Filled 2017-12-10 (×3): qty 1

## 2017-12-10 MED ORDER — LEVOTHYROXINE SODIUM 25 MCG PO TABS
125.0000 ug | ORAL_TABLET | Freq: Every day | ORAL | Status: DC
  Administered 2017-12-10 – 2017-12-12 (×3): 125 ug via ORAL
  Filled 2017-12-10 (×3): qty 1

## 2017-12-10 MED ORDER — VANCOMYCIN HCL 10 G IV SOLR
1250.0000 mg | Freq: Two times a day (BID) | INTRAVENOUS | Status: DC
  Administered 2017-12-10: 1250 mg via INTRAVENOUS
  Filled 2017-12-10 (×2): qty 1250

## 2017-12-10 MED ORDER — ONDANSETRON HCL 4 MG/2ML IJ SOLN: 4.0000 mg | Freq: Four times a day (QID) | INTRAMUSCULAR | Status: DC | PRN

## 2017-12-10 MED ORDER — HYDROCODONE-ACETAMINOPHEN 5-325 MG PO TABS: 1.0000 | ORAL_TABLET | Freq: Four times a day (QID) | ORAL | Status: DC | PRN

## 2017-12-10 MED ORDER — TIZANIDINE HCL 4 MG PO TABS
8.0000 mg | ORAL_TABLET | Freq: Three times a day (TID) | ORAL | Status: DC
  Administered 2017-12-10 – 2017-12-12 (×8): 8 mg via ORAL
  Filled 2017-12-10 (×8): qty 2

## 2017-12-10 NOTE — Progress Notes (Signed)
Pharmacy Antibiotic Note  Samuel [name] is a 57 y.o. male admitted on 12/09/2017 with sepsis.  Pharmacy has been consulted for Vancomycin, cefepime dosing.  Plan: Cefepime 2gm iv x1, then 1gm iv q8hr Vancomycin 1gm iv x1, then 1250mg  iv q12hr  Goal AUC = 400 - 500 for all indications, except meningitis (goal AUC > 500 and Cmin 15-20 mcg/mL)   Height: 6\' 1"  (185.4 cm) Weight: 160 lb 7.9 oz (72.8 kg) IBW/kg (Calculated) : 79.9  Temp (24hrs), Avg:100.1 F (37.8 C), Min:99 F (37.2 C), Max:102 F (38.9 C)  Recent Labs  Lab 12/09/17 1941 12/09/17 2127 12/10/17 0447  WBC 25.4*  --  21.5*  CREATININE 0.57*  --   --   LATICACIDVEN  --  1.86  --     Estimated Creatinine Clearance: 104.9 mL/min (A) (by C-G formula based on SCr of 0.57 mg/dL (L)).    Allergies  Allergen Reactions  . Penicillins Other (See Comments)    Unknown childhoood reaction (takes amoxicillian with reaction) Has tolerated ceftriaxone, cefepime, and Primaxin during inpatient admissions    Antimicrobials this admission: Vancomycin 12/09/2017 >> Cefepime 12/09/2017 >>   Dose adjustments this admission: -  Microbiology results: -  Thank you for allowing pharmacy to be a part of this patient's care.  Aleene Davidson Crowford 12/10/2017 5:13 AM

## 2017-12-10 NOTE — Progress Notes (Signed)
PHARMACY - PHYSICIAN COMMUNICATION CRITICAL VALUE ALERT - BLOOD CULTURE IDENTIFICATION (BCID)  Samuel [name] is an 57 y.o. male who presented to [facility] on 12/09/2017 with a chief complaint of N/V, abdominal pain, fever  Assessment:  Sepsis - likely UTI. Previous h/o citrobacter and ESBL klebsiella in UCx  Name of physician (or Provider) Contacted: Purohit  Current antibiotics: Meropenem  Changes to prescribed antibiotics recommended:  Patient is on recommended antibiotics - No changes needed (h/o ESBL organism)  Results for orders placed or performed during the hospital encounter of 12/09/17  Blood Culture ID Panel (Reflexed) (Collected: 12/09/2017  7:35 PM)  Result Value Ref Range   Enterococcus species NOT DETECTED NOT DETECTED   Listeria monocytogenes NOT DETECTED NOT DETECTED   Staphylococcus species NOT DETECTED NOT DETECTED   Staphylococcus aureus NOT DETECTED NOT DETECTED   Streptococcus species NOT DETECTED NOT DETECTED   Streptococcus agalactiae NOT DETECTED NOT DETECTED   Streptococcus pneumoniae NOT DETECTED NOT DETECTED   Streptococcus pyogenes NOT DETECTED NOT DETECTED   Acinetobacter baumannii NOT DETECTED NOT DETECTED   Enterobacteriaceae species DETECTED (A) NOT DETECTED   Enterobacter cloacae complex NOT DETECTED NOT DETECTED   Escherichia coli NOT DETECTED NOT DETECTED   Klebsiella oxytoca NOT DETECTED NOT DETECTED   Klebsiella pneumoniae NOT DETECTED NOT DETECTED   Proteus species NOT DETECTED NOT DETECTED   Serratia marcescens NOT DETECTED NOT DETECTED   Carbapenem resistance NOT DETECTED NOT DETECTED   Haemophilus influenzae NOT DETECTED NOT DETECTED   Neisseria meningitidis NOT DETECTED NOT DETECTED   Pseudomonas aeruginosa NOT DETECTED NOT DETECTED   Candida albicans NOT DETECTED NOT DETECTED   Candida glabrata NOT DETECTED NOT DETECTED   Candida krusei NOT DETECTED NOT DETECTED   Candida parapsilosis NOT DETECTED NOT DETECTED   Candida tropicalis NOT  DETECTED NOT DETECTED   Juliette Alcide, PharmD, BCPS.   Pager: [phone] 12/10/2017 3:56 PM

## 2017-12-10 NOTE — Progress Notes (Signed)
PROGRESS NOTE    Samuel [name]  ZOX:[id number] DOB: 11/24/1960 DOA: 12/09/2017 PCP: Barbie Banner, MD   Brief Narrative:  57 year old with past medical history relevant for relapsing remitting multiple sclerosis complicated by right upper extremity paralysis, bilateral lower extremity paralysis and multiple contractures, neurogenic bladder status post suprapubic catheter placement, nephrolithiasis, hypertension, questionable seizure disorder, hypothyroidism, chronic pain admitted with nausea, vomiting, abdominal pain and fever concerning for possible UTI.   Assessment & Plan:   Active Problems:   Sepsis (HCC)   #) Sepsis due to urinary source: Patient has had multiple infections with Citrobacter koseri as well as Klebsiella pneumonia.  Interestingly enough the Citrobacter is fairly sensitive however the Klebsiella is quite resistant, and appears to be an ESBL.  His Foley catheter was replaced just prior to admission.  As he does have a history of renal stones will obtain a noncontrast CT of the abdomen. -CT of the abdomen pelvis without contrast -Start meropenem 1 g every 8 started 12/10/2017 -Follow-up urine culture ordered today 12/10/2017 -Follow-up blood cultures x2 ordered 12/09/2017  #) Relapsing remitting multiple sclerosis: -Patient is on ocular Lizzie Mab infusions as an outpatient  #) Multiple contractures/spasms: -Continue baclofen 40 mg 3 times daily -Continue diazepam 5 mg 3 times daily -Continue tizanidine 8 mg 3 times daily  #) Neurogenic bladder status post suprapubic catheter: -Continue mirabegron 50 mg daily -Continue oxybutynin 15 mg daily - Patient is an outpatient Botox injections  #) Seizure disorder: Patient reports that he is currently not on levetiracetam however this is still on his medication list.  He apparently had some seizure-like activity as a side effect to medication. -Hold levetiracetam 5 mg twice daily  #) Pain/psych: -Continue perphenazine 2  mg twice daily - Continue venlafaxine 150mg  every morning  #) Hypertension: - Continue lisinopril 5 mg daily and 6.25 mg of HCTZ daily  #) Hypothyroidism: -Continue levothyroxine 125 mcg daily  Fluids: Gentle IV fluids Electrolytes: Monitor and supplement Nutrition: Regular diet  Prophylaxis: Enoxaparin  Disposition: Pending resolution of sepsis and oral antibiotics  Full code   Consultants:   None  Procedures: (Don't include imaging studies which can be auto populated. Include things that cannot be auto populated i.e. Echo, Carotid and venous dopplers, Foley, Bipap, HD, tubes/drains, wound vac, central lines etc)  None  Antimicrobials: (specify start and planned stop date. Auto populated tables are space occupying and do not give end dates)  IV meropenem started 12/10/2017   Subjective: Patient reports that he is feeling better than when he came in.  He is quite sleepy and quite fatigued however he reports that his nausea and vomiting is resolved.  He does not have much sensation in his abdomen does not report any abdominal pain.  Objective: Vitals:   12/09/17 2306 12/10/17 0014 12/10/17 0023 12/10/17 0534  BP: (!) 158/100  (!) 150/92 90/65  Pulse: (!) 133  (!) 103 71  Resp: (!) 24  20 18   Temp: 99.4 F (37.4 C)  99 F (37.2 C) 98.1 F (36.7 C)  TempSrc: Oral  Oral Oral  SpO2: 93%  95% 96%  Weight:  72.8 kg (160 lb 7.9 oz)    Height:  6\' 1"  (1.854 m)      Intake/Output Summary (Last 24 hours) at 12/10/2017 1157 Last data filed at 12/10/2017 1039 Gross per 24 hour  Intake 400 ml  Output 1575 ml  Net -1175 ml   Filed Weights   12/10/17 0014  Weight: 72.8 kg (160  lb 7.9 oz)    Examination:  General exam: Appears calm and comfortable  Respiratory system: Clear to auscultation over anterior lung fields. Respiratory effort normal. Cardiovascular system: Regular rate and rhythm, no murmurs Gastrointestinal system: Soft, nondistended, plus bowel sounds, no  organomegaly Central nervous system: Alert and oriented.  Right upper extremity flexed and contractured, bilateral lower extremities extended with bilateral plantar flexion of feet, atrophy and shiny bilaterally, no edema, diminished pulses in lower extremities Extremities: No lower extremity edema, shiny lower extremities, bilateral contractures Skin: No rashes on visible skin, suprapubic site is clean dry intact Psychiatry: Judgement and insight appear normal. Mood & affect appropriate.     Data Reviewed: I have personally reviewed following labs and imaging studies  CBC: Recent Labs  Lab 12/09/17 1941 12/10/17 0447  WBC 25.4* 21.5*  NEUTROABS 22.4  --   HGB 16.2 13.2  HCT 48.0 39.8  MCV 88.2 88.8  PLT 243 214   Basic Metabolic Panel: Recent Labs  Lab 12/09/17 1941 12/10/17 0447  NA 137 140  K 3.3* 3.5  CL 99* 106  CO2 24 25  GLUCOSE 125* 162*  BUN 11 10  CREATININE 0.57* 0.72  CALCIUM 9.6 8.8*   GFR: Estimated Creatinine Clearance: 104.9 mL/min (by C-G formula based on SCr of 0.72 mg/dL). Liver Function Tests: Recent Labs  Lab 12/09/17 1941  AST 23  ALT 31  ALKPHOS 120  BILITOT 1.0  PROT 7.8  ALBUMIN 4.2   No results for input(s): LIPASE, AMYLASE in the last 168 hours. No results for input(s): AMMONIA in the last 168 hours. Coagulation Profile: No results for input(s): INR, PROTIME in the last 168 hours. Cardiac Enzymes: No results for input(s): CKTOTAL, CKMB, CKMBINDEX, TROPONINI in the last 168 hours. BNP (last 3 results) No results for input(s): PROBNP in the last 8760 hours. HbA1C: No results for input(s): HGBA1C in the last 72 hours. CBG: No results for input(s): GLUCAP in the last 168 hours. Lipid Profile: No results for input(s): CHOL, HDL, LDLCALC, TRIG, CHOLHDL, LDLDIRECT in the last 72 hours. Thyroid Function Tests: No results for input(s): TSH, T4TOTAL, FREET4, T3FREE, THYROIDAB in the last 72 hours. Anemia Panel: No results for  input(s): VITAMINB12, FOLATE, FERRITIN, TIBC, IRON, RETICCTPCT in the last 72 hours. Sepsis Labs: Recent Labs  Lab 12/09/17 1941 12/09/17 2127  PROCALCITON 0.49  --   LATICACIDVEN  --  1.86    Recent Results (from the past 240 hour(s))  Culture, Urine     Status: None (Preliminary result)   Collection Time: 12/10/17 10:10 AM  Result Value Ref Range Status   Specimen Description   Final    URINE, SUPRAPUBIC Performed at [facility], [address] [place], [place] [address]    Special Requests   Final    NONE Performed at [facility], [address], [place], [place] [address]    Culture PENDING  Incomplete   Report Status PENDING  Incomplete         Radiology Studies: Dg Chest 1 View  Result Date: 12/09/2017 CLINICAL DATA:  Nausea and vomiting EXAM: CHEST  1 VIEW COMPARISON:  10/10/2015 FINDINGS: Low lung volumes. No consolidation or effusion. Normal heart size. No pneumothorax. IMPRESSION: No active disease.  Low lung volumes. Electronically Signed   By: Jasmine Pang M.D.   On: 12/09/2017 20:34        Scheduled Meds: . baclofen  40 mg Oral TID  . diazepam  5 mg Oral  TID  . docusate sodium  250 mg Oral Daily  . enoxaparin (LOVENOX) injection  40 mg Subcutaneous QHS  . levETIRAcetam  500 mg Oral BID  . levothyroxine  125 mcg Oral QAC breakfast  . mirabegron ER  50 mg Oral Daily  . oxybutynin  15 mg Oral Daily  . pantoprazole  40 mg Oral Daily  . perphenazine  2 mg Oral BID  . tiZANidine  8 mg Oral TID  . trazodone  300 mg Oral QHS  . venlafaxine XR  75 mg Oral Q breakfast   Continuous Infusions: . sodium chloride 75 mL/hr at 12/10/17 0050  . ceFEPime (MAXIPIME) IV Stopped (12/10/17 1610)  . vancomycin Stopped (12/10/17 0717)     LOS: 1 day    Time spent: 35    Delaine Lame, MD [facility]   If 7PM-7AM, please contact night-coverage [email or web address] Password [facility] 12/10/2017, 11:57 AM

## 2017-12-11 ENCOUNTER — Inpatient Hospital Stay (HOSPITAL_COMMUNITY): Payer: Medicare Other

## 2017-12-11 ENCOUNTER — Inpatient Hospital Stay (HOSPITAL_COMMUNITY): Payer: Medicare Other | Admitting: Certified Registered Nurse Anesthetist

## 2017-12-11 ENCOUNTER — Telehealth: Payer: Self-pay | Admitting: Neurology

## 2017-12-11 ENCOUNTER — Encounter (HOSPITAL_COMMUNITY)
Admission: EM | Disposition: A | Discharge status: Home / Self Care | Payer: Self-pay | Source: Home / Self Care | Attending: Internal Medicine

## 2017-12-11 ENCOUNTER — Encounter (HOSPITAL_COMMUNITY): Payer: Self-pay

## 2017-12-11 HISTORY — PX: CYSTOSCOPY WITH RETROGRADE PYELOGRAM, URETEROSCOPY AND STENT PLACEMENT: SHX5789

## 2017-12-11 LAB — CBC
HCT: 40.5 % (ref 39.0–52.0)
Hemoglobin: 12.8 g/dL — ABNORMAL LOW (ref 13.0–17.0)
MCH: 28.7 pg (ref 26.0–34.0)
MCHC: 31.6 g/dL (ref 30.0–36.0)
MCV: 90.8 fL (ref 78.0–100.0)
Platelets: 173 10*3/uL (ref 150–400)
RBC: 4.46 MIL/uL (ref 4.22–5.81)
RDW: 13.8 % (ref 11.5–15.5)
WBC: 11.5 10*3/uL — ABNORMAL HIGH (ref 4.0–10.5)

## 2017-12-11 SURGERY — CYSTOURETEROSCOPY, WITH RETROGRADE PYELOGRAM AND STENT INSERTION
Anesthesia: General | Site: Ureter | Laterality: Right

## 2017-12-11 MED ORDER — MIDAZOLAM HCL 2 MG/2ML IJ SOLN
INTRAMUSCULAR | Status: AC
  Filled 2017-12-11: qty 2

## 2017-12-11 MED ORDER — FENTANYL CITRATE (PF) 100 MCG/2ML IJ SOLN
INTRAMUSCULAR | Status: DC | PRN
  Administered 2017-12-11 (×2): 25 ug via INTRAVENOUS

## 2017-12-11 MED ORDER — EPHEDRINE SULFATE-NACL 50-0.9 MG/10ML-% IV SOSY
PREFILLED_SYRINGE | INTRAVENOUS | Status: DC | PRN
  Administered 2017-12-11: 10 mg via INTRAVENOUS

## 2017-12-11 MED ORDER — STERILE WATER FOR IRRIGATION IR SOLN
Status: DC | PRN
  Administered 2017-12-11: 500 mL

## 2017-12-11 MED ORDER — FENTANYL CITRATE (PF) 100 MCG/2ML IJ SOLN
INTRAMUSCULAR | Status: AC
  Filled 2017-12-11: qty 2

## 2017-12-11 MED ORDER — LACTATED RINGERS IV SOLN
INTRAVENOUS | Status: DC
  Administered 2017-12-11: 1000 mL via INTRAVENOUS

## 2017-12-11 MED ORDER — PROPOFOL 10 MG/ML IV BOLUS
INTRAVENOUS | Status: AC
  Filled 2017-12-11: qty 20

## 2017-12-11 MED ORDER — ONDANSETRON HCL 4 MG/2ML IJ SOLN
INTRAMUSCULAR | Status: DC | PRN
  Administered 2017-12-11: 4 mg via INTRAVENOUS

## 2017-12-11 MED ORDER — ONDANSETRON HCL 4 MG/2ML IJ SOLN
INTRAMUSCULAR | Status: AC
  Filled 2017-12-11: qty 2

## 2017-12-11 MED ORDER — LIDOCAINE 2% (20 MG/ML) 5 ML SYRINGE
INTRAMUSCULAR | Status: AC
  Filled 2017-12-11: qty 5

## 2017-12-11 MED ORDER — PHENAZOPYRIDINE HCL 200 MG PO TABS
200.0000 mg | ORAL_TABLET | ORAL | Status: AC
  Administered 2017-12-11: 200 mg via ORAL
  Filled 2017-12-11: qty 1

## 2017-12-11 MED ORDER — PROPOFOL 10 MG/ML IV BOLUS
INTRAVENOUS | Status: DC | PRN
  Administered 2017-12-11: 150 mg via INTRAVENOUS

## 2017-12-11 MED ORDER — IOPAMIDOL (ISOVUE-300) INJECTION 61%
INTRAVENOUS | Status: DC | PRN
  Administered 2017-12-11: 5 mL via URETHRAL

## 2017-12-11 MED ORDER — DEXAMETHASONE SODIUM PHOSPHATE 10 MG/ML IJ SOLN
INTRAMUSCULAR | Status: AC
  Filled 2017-12-11: qty 1

## 2017-12-11 MED ORDER — SODIUM CHLORIDE 0.9 % IR SOLN
Status: DC | PRN
  Administered 2017-12-11: 4000 mL

## 2017-12-11 MED ORDER — FENTANYL CITRATE (PF) 100 MCG/2ML IJ SOLN: 25.0000 ug | INTRAMUSCULAR | Status: DC | PRN

## 2017-12-11 MED ORDER — DEXAMETHASONE SODIUM PHOSPHATE 10 MG/ML IJ SOLN
INTRAMUSCULAR | Status: DC | PRN
  Administered 2017-12-11: 10 mg via INTRAVENOUS

## 2017-12-11 MED ORDER — LIDOCAINE 2% (20 MG/ML) 5 ML SYRINGE
INTRAMUSCULAR | Status: DC | PRN
  Administered 2017-12-11: 100 mg via INTRAVENOUS

## 2017-12-11 SURGICAL SUPPLY — 27 items
BAG URINE DRAINAGE (UROLOGICAL SUPPLIES) ×1 IMPLANT
BAG URO CATCHER STRL LF (MISCELLANEOUS) ×2 IMPLANT
BASKET ZERO TIP NITINOL 2.4FR (BASKET) ×2 IMPLANT
BSKT STON RTRVL ZERO TP 2.4FR (BASKET) ×1
CATH FOLEY 2WAY SLVR  5CC 16FR (CATHETERS) ×1
CATH FOLEY 2WAY SLVR 5CC 16FR (CATHETERS) IMPLANT
CATH INTERMIT  6FR 70CM (CATHETERS) ×2 IMPLANT
CATH URET 5FR 28IN CONE TIP (BALLOONS) ×1
CATH URET 5FR 70CM CONE TIP (BALLOONS) ×1 IMPLANT
CATH URET WHISTLE 6FR (CATHETERS) ×2 IMPLANT
CLOTH BEACON ORANGE TIMEOUT ST (SAFETY) ×2 IMPLANT
COVER FOOTSWITCH UNIV (MISCELLANEOUS) IMPLANT
COVER SURGICAL LIGHT HANDLE (MISCELLANEOUS) ×2 IMPLANT
GLOVE BIO SURGEON STRL SZ7.5 (GLOVE) ×3 IMPLANT
GLOVE BIOGEL PI IND STRL 7.5 (GLOVE) IMPLANT
GLOVE BIOGEL PI INDICATOR 7.5 (GLOVE) ×1
GLOVE SURG SS PI 7.5 STRL IVOR (GLOVE) ×2 IMPLANT
GOWN SPEC L3 XXLG W/TWL (GOWN DISPOSABLE) ×1 IMPLANT
GOWN STRL REUS W/TWL XL LVL3 (GOWN DISPOSABLE) ×2 IMPLANT
GUIDEWIRE STR DUAL SENSOR (WIRE) ×2 IMPLANT
MANIFOLD NEPTUNE II (INSTRUMENTS) ×2 IMPLANT
PACK CYSTO (CUSTOM PROCEDURE TRAY) ×2 IMPLANT
SHEATH ACCESS URETERAL 24CM (SHEATH) ×2 IMPLANT
SHEATH URETERAL 12FRX35CM (MISCELLANEOUS) ×2 IMPLANT
STENT URET 6FRX26 CONTOUR (STENTS) ×1 IMPLANT
TUBING CONNECTING 10 (TUBING) ×2 IMPLANT
WIRE COONS/BENSON .038X145CM (WIRE) ×2 IMPLANT

## 2017-12-11 NOTE — Interval H&P Note (Signed)
History and Physical Interval Note:  12/11/2017 3:07 PM  Samuel [name]  has presented today for surgery, with the diagnosis of RIGHT RENAL STONE, FEVER  The various methods of treatment have been discussed with the patient and family. After consideration of risks, benefits and other options for treatment, the patient has consented to  Cystoscopy with right retrograde pyelogram and right ureteral stent placement as a surgical intervention. We discussed side effects of the proposed treatment, the likelihood of the patient achieving the goals of the procedure, and any potential problems that might occur during the procedure or recuperation. The patient's history has been reviewed, patient examined, no change in status, stable for surgery.  I have reviewed the patient's chart and labs.  Questions were answered to the patient's satisfaction.  His urine is orange.    [name]

## 2017-12-11 NOTE — Op Note (Signed)
Preoperative diagnosis: Right renal stones, right hydronephrosis, fever  postoperative diagnosis: Same  Procedure: Cystoscopy, right retrograde pyelogram, right ureteral stent placement, suprapubic tube change  Surgeon: Mena Goes   anesthesia: General  Indication for procedure: 57 year old male with paralysis due to MS, chronic suprapubic tube who was admitted with fever nausea vomiting.  CT scan revealed 11 mm right UPJ stone with some mild collecting system dilation and perinephric stranding.  The stones appeared to be in position to cause intermittent obstruction and had grown from prior ultrasound.  He was brought for ureteral stent to prepare for a staged right ureteroscopy.  Findings: The penis was circumcised and without mass or lesion.  On cystoscopy the urethra and prostatic urethra were normal.  The bladder neck was very high and tight and a bit difficult to get the scope in the bladder.  The suprapubic tube was in good position.  The trigone contained bullous edema which is likely what Dr. Alvester Morin had plan to biopsy.  Orange urine was noted emanating from the area of the right trigone and the right ureteral orifice was located.  The left ureteral orifice was not located.  Right retrograde pyelogram- the ureter appeared normal.  There were filling defects in the renal pelvis consistent with both stones,  minimal hydronephrosis.  Description of procedure: After consent was obtained the patient brought to the operating room.  After adequate anesthesia he was carefully placed in lithotomy position. 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  The suprapubic tube was removed.  I prepped the suprapubic tube site into the field.  Gloves were changed.  I then placed a new 18 French suprapubic tube and inflated the balloon to 10 cc.  The rigid cystoscope was passed per urethra and with some difficulty angled up over the bladder neck and into the bladder.  The suprapubic tube was in  good position.  The trigone was covered in bullous edema but no obvious mass.  Visualization of the bladder was somewhat limited by the tight bladder neck.  I could see orange urine welling up in the area of the right trigone and a sensor wire was advanced in the right ureteral orifice was located.  I advanced a 6 [place] open-ended catheter into the right distal ureter and retrograde injection of contrast was performed.  I then passed a sensor wire into the upper pole calyx to get around the stones.  Older more debris laden urine drained.  I then passed a 6 x 26 cm stent and remove the wire.  A good coil was seen in the upper pole calyx and a good coil in the bladder.  The bladder was irrigated continuously through the SP tube as there was some mild bleeding from the bladder neck due to the scope.  I then backed the scope out and left the SP tube to gravity drainage.  He was awakened and taken to the recovery room in stable condition.  Complications: None   blood loss: Minimal  Specimens: None  Drains: 6 x 26 cm right ureteral stent; suprapubic tube  Disposition: Patient stable to PACU

## 2017-12-11 NOTE — Consult Note (Signed)
Consultation: Right renal stone, fever and bacteremia Requested by: Dr. Renaye Rakers. Purohit     History of Present Illness: 57 year old white male with a history of MS and suprapubic tube recently saw Dr. Alvester Morin in the office.  He was admitted with lethargy, nausea vomiting and fever.  CT scan revealed enlarging right renal stone of about 11 mm with an irregular 6 mm stone above this and mild dilation of the collecting system more so of the upper pole collecting system, and some peri-nephric stranding concerning for obstruction and infection.  The patient has improved with IV fluids and IV antibiotics.  He denies any flank pain.  The right stones were seen on ultrasound December 2018, but they were only measuring about 7 mm.  He continues to have fevers.  Past Medical History:  Diagnosis Date  . Bladder calculi   . Chronic back pain   . Chronic fatigue   . Foley catheter in place   . Gait disorder   . GERD (gastroesophageal reflux disease)   . Hyperlipidemia   . Hypertension   . Hypothyroidism   . Incontinence of urine   . Multiple sclerosis (HCC) NEUROLOGIST-- [name]   dx 1993, JC virus negative 2/14---  CURRENTLY RECEIVING TYSABRI IV TREATMENT  . Neurogenic bladder   . Neurogenic bowel    intermittant constipation and diarrhea  . Pneumonia    dx  03-03-2014--  admitted for iv and oral antibiotics  . Shortness of breath    secondary to fatique  . 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 Urinary retention   . Vitamin D deficiency   . Yeast infection    genital area   Past Surgical History:  Procedure Laterality Date  . APPENDECTOMY  age 55  . CYSTOSCOPY N/A 03/21/2014   Procedure: CYSTOSCOPY TREATMENT OF BLADDER CALCULI;  Surgeon: [name], MD;  Location: [facility];  Service: Urology;  Laterality: N/A;  . INSERTION OF SUPRAPUBIC CATHETER N/A 03/21/2014   Procedure: INSERTION OF SUPRAPUBIC CATHETER;  Surgeon: [name], MD;  Location: [facility];  Service: Urology;  Laterality: N/A;  . NASAL SEPTUM SURGERY  1996  . NEGATIVE SLEEP STUDY  1996  . TRANSTHORACIC ECHOCARDIOGRAM  02-07-2004   MILD LVH/  EF 55-65%    Home Medications:  Facility-Administered Medications Prior to Admission  Medication Dose Route Frequency Provider Last Rate Last Dose  . botulinum toxin Type A (BOTOX) injection 300 Units  300 Units Intramuscular Once Levert Feinstein, MD       Medications Prior to Admission  Medication Sig Dispense Refill Last Dose  . acetic acid 0.25 % irrigation Irrigate with as directed daily. Instill 30ml and clamp tube for 30 minutes then drain. 500 mL 12 12/08/2017 at Unknown time  . baclofen (LIORESAL) 20 MG tablet TAKE TWO TABLETS BY MOUTH THREE TIMES DAILY 540 tablet 3 12/09/2017 at Unknown time  . diazepam (VALIUM) 5 MG tablet Take 1 tablet (5 mg total) by mouth 3 (three) times daily. 270 tablet 1 12/09/2017 at Unknown time  . docusate sodium (COLACE) 250 MG capsule Take 250 mg by mouth daily.   12/09/2017 at Unknown time  . HYDROcodone-acetaminophen (NORCO/VICODIN) 5-325 MG tablet Take 1 tablet by mouth every 6 (six) hours as needed. 30 tablet 0 12/09/2017 at Unknown time  . levETIRAcetam (KEPPRA) 500 MG tablet Take 1 tablet (500 mg total) by mouth 2 (two) times daily. 60 tablet 0 12/09/2017 at Unknown time  . levothyroxine (SYNTHROID, LEVOTHROID)  125 MCG tablet Take 125 mcg by mouth daily.   12/09/2017 at Unknown time  . lisinopril-hydrochlorothiazide (PRINZIDE,ZESTORETIC) 10-12.5 MG tablet Take 0.5 tablets by mouth every morning.   12/09/2017 at Unknown time  . mirabegron ER (MYRBETRIQ) 50 MG TB24 tablet Take 50 mg by mouth daily.   12/09/2017 at Unknown time  . omeprazole (PRILOSEC) 20 MG capsule Take 20 mg by mouth every morning.    12/09/2017 at Unknown time  . oxybutynin (DITROPAN XL) 15 MG 24 hr tablet Take 15 mg by mouth daily.    12/09/2017 at Unknown time  . perphenazine (TRILAFON) 2 MG tablet TAKE 1 TABLET BY MOUTH TWICE DAILY 60 tablet 5  12/09/2017 at Unknown time  . phenazopyridine (PYRIDIUM) 97 MG tablet Take 97 mg by mouth 2 (two) times daily.   12/09/2017 at Unknown time  . scopolamine (TRANSDERM-SCOP, 1.5 MG,) 1 MG/3DAYS Place 1 patch (1.5 mg total) onto the skin every 3 (three) days. 10 patch 3 not used  . tiZANidine (ZANAFLEX) 4 MG tablet TAKE TWO TABLETS BY MOUTH THREE TIMES DAILY 540 tablet 1 12/09/2017 at Unknown time  . traMADol (ULTRAM) 50 MG tablet TAKE 1 TABLET BY MOUTH EVERY 6 HOURS AS NEEDED (Patient taking differently: TAKE 1 TABLET BY MOUTH EVERY 6 HOURS AS NEEDED FOR PAIN) 60 tablet 5 Past Week at Unknown time  . trazodone (DESYREL) 300 MG tablet TAKE 1 TABLET BY MOUTH ONCE DAILY AT BEDTIME 30 tablet 5 12/08/2017 at Unknown time  . venlafaxine XR (EFFEXOR-XR) 75 MG 24 hr capsule TAKE 2 CAPSULES BY MOUTH ONCE DAILY WITH BREAKFAST 180 capsule 0 12/09/2017 at Unknown time  . Vitamin D, Ergocalciferol, (DRISDOL) 50000 UNITS CAPS Take 50,000 Units by mouth every 7 (seven) days. Friday only   Past Week at Unknown time   Allergies:  Allergies  Allergen Reactions  . Penicillins Other (See Comments)    Unknown childhoood reaction (takes amoxicillian with reaction) Has tolerated ceftriaxone, cefepime, and Primaxin during inpatient admissions    Family History  Problem Relation Age of Onset  . Stroke Father   . Cirrhosis Brother   . Hypothyroidism Sister    Social History:  reports that he quit smoking about 32 years ago. His smoking use included cigarettes. He has a 12.00 pack-year smoking history. He has never used smokeless tobacco. He reports that he does not drink alcohol or use drugs.  ROS: A complete review of systems was performed.  All systems are negative except for pertinent findings as noted. Review of Systems  Neurological: Positive for weakness.  All other systems reviewed and are negative.    Physical Exam:  Vital signs in last 24 hours: Temp:  [97.7 F (36.5 C)-99.4 F (37.4 C)] 99.4 F (37.4 C)  (04/11 0536) Pulse Rate:  [65-73] 65 (04/11 0536) Resp:  [18] 18 (04/11 0536) BP: (102-116)/(58-68) 116/68 (04/11 0536) SpO2:  [95 %-98 %] 97 % (04/11 0536) General:  Alert and oriented, No acute distress HEENT: Normocephalic, atraumatic Cardiovascular: Regular rate and rhythm Lungs: Regular rate and effort Abdomen: Soft, nontender, nondistended, no abdominal masses, SP tube in place with clear urine Back: No CVA tenderness Extremities: No edema Neurologic: Limited movement of his hands, no movement of the lower extremities.  Laboratory Data:  Results for orders placed or performed during the hospital encounter of 12/09/17 (from the past 24 hour(s))  CBC     Status: Abnormal   Collection Time: 12/11/17  4:35 AM  Result Value Ref Range   WBC  11.5 (H) 4.0 - 10.5 K/uL   RBC 4.46 4.22 - 5.81 MIL/uL   Hemoglobin 12.8 (L) 13.0 - 17.0 g/dL   HCT 02.7 25.3 - 66.4 %   MCV 90.8 78.0 - 100.0 fL   MCH 28.7 26.0 - 34.0 pg   MCHC 31.6 30.0 - 36.0 g/dL   RDW 40.3 47.4 - 25.9 %   Platelets 173 150 - 400 K/uL   Recent Results (from the past 240 hour(s))  Blood Culture (routine x 2)     Status: None (Preliminary result)   Collection Time: 12/09/17  7:35 PM  Result Value Ref Range Status   Specimen Description   Final    BLOOD RIGHT HAND Performed at [facility], [address], [place], [place] [address]    Special Requests   Final    BOTTLES DRAWN AEROBIC AND ANAEROBIC Blood Culture results may not be optimal due to an excessive volume of blood received in culture bottles Performed at [facility], [address] [place], [place] [address]    Culture  Setup Time   Final    GRAM NEGATIVE RODS AEROBIC BOTTLE ONLY Organism ID to follow CRITICAL RESULT CALLED TO, READ BACK BY AND VERIFIED WITH: Hurshel Party PharmD 15:40 12/10/17 ([email or web address])    Culture   Final    Romie Minus NEGATIVE RODS IDENTIFICATION AND SUSCEPTIBILITIES TO FOLLOW Performed at [facility], [address] [place], [place] [address]    Report Status PENDING  Incomplete  Blood Culture ID Panel (Reflexed)     Status: Abnormal   Collection Time: 12/09/17  7:35 PM  Result Value Ref Range Status   Enterococcus species NOT DETECTED NOT DETECTED Final   Listeria monocytogenes NOT DETECTED NOT DETECTED Final   Staphylococcus species NOT DETECTED NOT DETECTED Final   Staphylococcus aureus NOT DETECTED NOT DETECTED Final   Streptococcus species NOT DETECTED NOT DETECTED Final   Streptococcus agalactiae NOT DETECTED NOT DETECTED Final   Streptococcus pneumoniae NOT DETECTED NOT DETECTED Final   Streptococcus pyogenes NOT DETECTED NOT DETECTED Final   Acinetobacter baumannii NOT DETECTED NOT DETECTED Final   Enterobacteriaceae species DETECTED (A) NOT DETECTED Final    Comment: Enterobacteriaceae represent a large family of gram negative bacteria, not a single organism. Refer to culture for further identification. CRITICAL RESULT CALLED TO, READ BACK BY AND VERIFIED WITH: Hurshel Party PharmD 15:40 12/10/17 ([email or web address])    Enterobacter cloacae complex NOT DETECTED NOT DETECTED Final   Escherichia coli NOT DETECTED NOT DETECTED Final   Klebsiella oxytoca NOT DETECTED NOT DETECTED Final   Klebsiella pneumoniae NOT DETECTED NOT DETECTED Final   Proteus species NOT DETECTED NOT DETECTED Final   Serratia marcescens NOT DETECTED NOT DETECTED Final   Carbapenem resistance NOT DETECTED NOT DETECTED Final   Haemophilus influenzae NOT DETECTED NOT DETECTED Final   Neisseria meningitidis NOT DETECTED NOT DETECTED Final   Pseudomonas aeruginosa NOT DETECTED NOT DETECTED Final   Candida albicans NOT DETECTED NOT DETECTED Final   Candida glabrata NOT DETECTED NOT DETECTED Final   Candida krusei NOT DETECTED NOT DETECTED Final   Candida parapsilosis NOT DETECTED NOT DETECTED Final   Candida tropicalis NOT DETECTED NOT DETECTED Final    Comment: Performed at [facility], [address] [place], [place] [address]  Culture, Urine     Status: None (Preliminary result)   Collection Time: 12/10/17 10:10 AM  Result Value Ref Range Status   Specimen Description  Final    URINE, SUPRAPUBIC Performed at [facility], [address], [place], [place] [address]    Special Requests   Final    NONE Performed at [facility], [address], [place], [place] [address]    Culture   Final    CULTURE REINCUBATED FOR BETTER GROWTH Performed at [facility], [address], [place], [place] [address]    Report Status PENDING  Incomplete   Creatinine: Recent Labs    12/09/17 1941 12/10/17 0447  CREATININE 0.57* 0.72   CT - I reviewed the images. I reviewed [place] images.   I reviewed Urochart notes.  Impression/Assessment/plan: Fever, possible right pyelonephritis, enlarging right renal stones, possible mild and/or intermittent obstruction- I discussed with the patient and I called his sister and went over the nature risks benefits and alternatives to cystoscopy with right retrograde pyelogram and right ureteral stent placement with follow-up right ureteroscopy, holmium laser lithotripsy in a few weeks.  I drew him a picture of the anatomy and CT findings.  We discussed the rationale for a staged procedure.  We discussed the right renal stones are enlarging and could cause obstruction and infection in the future and also be harder to remove.  We did discuss cardiovascular complications with surgery and anesthesia as well as his known small capacity bladder and bladder inflammation which might limit retrograde access.  All questions answered.  He elects to proceed with cystoscopy and right ureteral stent.  [name] 12/11/2017, 11:01 AM

## 2017-12-11 NOTE — Anesthesia Procedure Notes (Signed)
Procedure Name: LMA Insertion Date/Time: 12/11/2017 3:15 PM Performed by: Doran Clay, CRNA Pre-anesthesia Checklist: Patient identified, Emergency Drugs available, Suction available, Patient being monitored and Timeout performed Patient Re-evaluated:Patient Re-evaluated prior to induction Oxygen Delivery Method: Circle system utilized Preoxygenation: Pre-oxygenation with 100% oxygen Induction Type: IV induction LMA: LMA inserted LMA Size: 4.0 Number of attempts: 1 Placement Confirmation: positive ETCO2 and breath sounds checked- equal and bilateral Tube secured with: Tape Dental Injury: Teeth and Oropharynx as per pre-operative assessment

## 2017-12-11 NOTE — H&P (View-Only) (Signed)
Consultation: Right renal stone, fever and bacteremia Requested by: Samuel [name]     History of Present Illness: 57-year-old white male with a history of MS and suprapubic tube recently saw Samuel [name] in the office.  He was admitted with lethargy, nausea vomiting and fever.  CT scan revealed enlarging right renal stone of about 11 mm with an irregular 6 mm stone above this and mild dilation of the collecting system more so of the upper pole collecting system, and some peri-nephric stranding concerning for obstruction and infection.  The patient has improved with IV fluids and IV antibiotics.  He denies any flank pain.  The right stones were seen on ultrasound December 2018, but they were only measuring about 7 mm.  He continues to have fevers.  Past Medical History:  Diagnosis Date  . Bladder calculi   . Chronic back pain   . Chronic fatigue   . Foley catheter in place   . Gait disorder   . GERD (gastroesophageal reflux disease)   . Hyperlipidemia   . Hypertension   . Hypothyroidism   . Incontinence of urine   . Multiple sclerosis (HCC) NEUROLOGIST-- Samuel [name]   dx 1993, JC virus negative 2/14---  CURRENTLY RECEIVING TYSABRI IV TREATMENT  . Neurogenic bladder   . Neurogenic bowel    intermittant constipation and diarrhea  . Pneumonia    dx  03-03-2014--  admitted for iv and oral antibiotics  . Shortness of breath    secondary to fatique  . 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 Urinary retention   . Vitamin D deficiency   . Yeast infection    genital area   Past Surgical History:  Procedure Laterality Date  . APPENDECTOMY  age 13  . CYSTOSCOPY N/A 03/21/2014   Procedure: CYSTOSCOPY TREATMENT OF BLADDER CALCULI;  Surgeon: Samuel S Grapey, MD;  Location: [facility];  Service: Urology;  Laterality: N/A;  . INSERTION OF SUPRAPUBIC CATHETER N/A 03/21/2014   Procedure: INSERTION OF SUPRAPUBIC CATHETER;  Surgeon: Samuel S Grapey, MD;  Location: [facility];  Service: Urology;  Laterality: N/A;  . NASAL SEPTUM SURGERY  1996  . NEGATIVE SLEEP STUDY  1996  . TRANSTHORACIC ECHOCARDIOGRAM  02-07-2004   MILD LVH/  EF 55-65%    Home Medications:  Facility-Administered Medications Prior to Admission  Medication Dose Route Frequency Provider Last Rate Last Dose  . botulinum toxin Type A (BOTOX) injection 300 Units  300 Units Intramuscular Once Yan, Yijun, MD       Medications Prior to Admission  Medication Sig Dispense Refill Last Dose  . acetic acid 0.25 % irrigation Irrigate with as directed daily. Instill 30ml and clamp tube for 30 minutes then drain. 500 mL 12 12/08/2017 at Unknown time  . baclofen (LIORESAL) 20 MG tablet TAKE TWO TABLETS BY MOUTH THREE TIMES DAILY 540 tablet 3 12/09/2017 at Unknown time  . diazepam (VALIUM) 5 MG tablet Take 1 tablet (5 mg total) by mouth 3 (three) times daily. 270 tablet 1 12/09/2017 at Unknown time  . docusate sodium (COLACE) 250 MG capsule Take 250 mg by mouth daily.   12/09/2017 at Unknown time  . HYDROcodone-acetaminophen (NORCO/VICODIN) 5-325 MG tablet Take 1 tablet by mouth every 6 (six) hours as needed. 30 tablet 0 12/09/2017 at Unknown time  . levETIRAcetam (KEPPRA) 500 MG tablet Take 1 tablet (500 mg total) by mouth 2 (two) times daily. 60 tablet 0 12/09/2017 at Unknown time  . levothyroxine (SYNTHROID, LEVOTHROID)   125 MCG tablet Take 125 mcg by mouth daily.   12/09/2017 at Unknown time  . lisinopril-hydrochlorothiazide (PRINZIDE,ZESTORETIC) 10-12.5 MG tablet Take 0.5 tablets by mouth every morning.   12/09/2017 at Unknown time  . mirabegron ER (MYRBETRIQ) 50 MG TB24 tablet Take 50 mg by mouth daily.   12/09/2017 at Unknown time  . omeprazole (PRILOSEC) 20 MG capsule Take 20 mg by mouth every morning.    12/09/2017 at Unknown time  . oxybutynin (DITROPAN XL) 15 MG 24 hr tablet Take 15 mg by mouth daily.    12/09/2017 at Unknown time  . perphenazine (TRILAFON) 2 MG tablet TAKE 1 TABLET BY MOUTH TWICE DAILY 60 tablet 5  12/09/2017 at Unknown time  . phenazopyridine (PYRIDIUM) 97 MG tablet Take 97 mg by mouth 2 (two) times daily.   12/09/2017 at Unknown time  . scopolamine (TRANSDERM-SCOP, 1.5 MG,) 1 MG/3DAYS Place 1 patch (1.5 mg total) onto the skin every 3 (three) days. 10 patch 3 not used  . tiZANidine (ZANAFLEX) 4 MG tablet TAKE TWO TABLETS BY MOUTH THREE TIMES DAILY 540 tablet 1 12/09/2017 at Unknown time  . traMADol (ULTRAM) 50 MG tablet TAKE 1 TABLET BY MOUTH EVERY 6 HOURS AS NEEDED (Patient taking differently: TAKE 1 TABLET BY MOUTH EVERY 6 HOURS AS NEEDED FOR PAIN) 60 tablet 5 Past Week at Unknown time  . trazodone (DESYREL) 300 MG tablet TAKE 1 TABLET BY MOUTH ONCE DAILY AT BEDTIME 30 tablet 5 12/08/2017 at Unknown time  . venlafaxine XR (EFFEXOR-XR) 75 MG 24 hr capsule TAKE 2 CAPSULES BY MOUTH ONCE DAILY WITH BREAKFAST 180 capsule 0 12/09/2017 at Unknown time  . Vitamin D, Ergocalciferol, (DRISDOL) 50000 UNITS CAPS Take 50,000 Units by mouth every 7 (seven) days. Friday only   Past Week at Unknown time   Allergies:  Allergies  Allergen Reactions  . Penicillins Other (See Comments)    Unknown childhoood reaction (takes amoxicillian with reaction) Has tolerated ceftriaxone, cefepime, and Primaxin during inpatient admissions    Family History  Problem Relation Age of Onset  . Stroke Father   . Cirrhosis Brother   . Hypothyroidism Sister    Social History:  reports that he quit smoking about 32 years ago. His smoking use included cigarettes. He has a 12.00 pack-year smoking history. He has never used smokeless tobacco. He reports that he does not drink alcohol or use drugs.  ROS: A complete review of systems was performed.  All systems are negative except for pertinent findings as noted. Review of Systems  Neurological: Positive for weakness.  All other systems reviewed and are negative.    Physical Exam:  Vital signs in last 24 hours: Temp:  [97.7 F (36.5 C)-99.4 F (37.4 C)] 99.4 F (37.4 C)  (04/11 0536) Pulse Rate:  [65-73] 65 (04/11 0536) Resp:  [18] 18 (04/11 0536) BP: (102-116)/(58-68) 116/68 (04/11 0536) SpO2:  [95 %-98 %] 97 % (04/11 0536) General:  Alert and oriented, No acute distress HEENT: Normocephalic, atraumatic Cardiovascular: Regular rate and rhythm Lungs: Regular rate and effort Abdomen: Soft, nontender, nondistended, no abdominal masses, SP tube in place with clear urine Back: No CVA tenderness Extremities: No edema Neurologic: Limited movement of his hands, no movement of the lower extremities.  Laboratory Data:  Results for orders placed or performed during the hospital encounter of 12/09/17 (from the past 24 hour(s))  CBC     Status: Abnormal   Collection Time: 12/11/17  4:35 AM  Result Value Ref Range   WBC   11.5 (H) 4.0 - 10.5 K/uL   RBC 4.46 4.22 - 5.81 MIL/uL   Hemoglobin 12.8 (L) 13.0 - 17.0 g/dL   HCT 40.5 39.0 - 52.0 %   MCV 90.8 78.0 - 100.0 fL   MCH 28.7 26.0 - 34.0 pg   MCHC 31.6 30.0 - 36.0 g/dL   RDW 13.8 11.5 - 15.5 %   Platelets 173 150 - 400 K/uL   Recent Results (from the past 240 hour(s))  Blood Culture (routine x 2)     Status: None (Preliminary result)   Collection Time: 12/09/17  7:35 PM  Result Value Ref Range Status   Specimen Description   Final    BLOOD RIGHT HAND Performed at [facility], [address] [place], [place] [address]    Special Requests   Final    BOTTLES DRAWN AEROBIC AND ANAEROBIC Blood Culture results may not be optimal due to an excessive volume of blood received in culture bottles Performed at [facility], [address] [place], [place] [address]    Culture  Setup Time   Final    GRAM NEGATIVE RODS AEROBIC BOTTLE ONLY Organism ID to follow CRITICAL RESULT CALLED TO, READ BACK BY AND VERIFIED WITH: D. Zeigler PharmD 15:40 12/10/17 ([email or web address])    Culture   Final    GRAM NEGATIVE RODS IDENTIFICATION AND SUSCEPTIBILITIES TO FOLLOW Performed at [facility], [address] [place], [place] [address]    Report Status PENDING  Incomplete  Blood Culture ID Panel (Reflexed)     Status: Abnormal   Collection Time: 12/09/17  7:35 PM  Result Value Ref Range Status   Enterococcus species NOT DETECTED NOT DETECTED Final   Listeria monocytogenes NOT DETECTED NOT DETECTED Final   Staphylococcus species NOT DETECTED NOT DETECTED Final   Staphylococcus aureus NOT DETECTED NOT DETECTED Final   Streptococcus species NOT DETECTED NOT DETECTED Final   Streptococcus agalactiae NOT DETECTED NOT DETECTED Final   Streptococcus pneumoniae NOT DETECTED NOT DETECTED Final   Streptococcus pyogenes NOT DETECTED NOT DETECTED Final   Acinetobacter baumannii NOT DETECTED NOT DETECTED Final   Enterobacteriaceae species DETECTED (A) NOT DETECTED Final    Comment: Enterobacteriaceae represent a large family of gram negative bacteria, not a single organism. Refer to culture for further identification. CRITICAL RESULT CALLED TO, READ BACK BY AND VERIFIED WITH: D. Zeigler PharmD 15:40 12/10/17 ([email or web address])    Enterobacter cloacae complex NOT DETECTED NOT DETECTED Final   Escherichia coli NOT DETECTED NOT DETECTED Final   Klebsiella oxytoca NOT DETECTED NOT DETECTED Final   Klebsiella pneumoniae NOT DETECTED NOT DETECTED Final   Proteus species NOT DETECTED NOT DETECTED Final   Serratia marcescens NOT DETECTED NOT DETECTED Final   Carbapenem resistance NOT DETECTED NOT DETECTED Final   Haemophilus influenzae NOT DETECTED NOT DETECTED Final   Neisseria meningitidis NOT DETECTED NOT DETECTED Final   Pseudomonas aeruginosa NOT DETECTED NOT DETECTED Final   Candida albicans NOT DETECTED NOT DETECTED Final   Candida glabrata NOT DETECTED NOT DETECTED Final   Candida krusei NOT DETECTED NOT DETECTED Final   Candida parapsilosis NOT DETECTED NOT DETECTED Final   Candida tropicalis NOT DETECTED NOT DETECTED Final    Comment: Performed at [facility], [address] [place], [place] [address]  Culture, Urine     Status: None (Preliminary result)   Collection Time: 12/10/17 10:10 AM  Result Value Ref Range Status   Specimen Description     Final    URINE, SUPRAPUBIC Performed at [facility], [address] [place], [place] [address]    Special Requests   Final    NONE Performed at [facility], [address] [place], [place] [address]    Culture   Final    CULTURE REINCUBATED FOR BETTER GROWTH Performed at [facility], [address] [place], [place] [address]    Report Status PENDING  Incomplete   Creatinine: Recent Labs    12/09/17 1941 12/10/17 0447  CREATININE 0.57* 0.72   CT - I reviewed the images. I reviewed US images.   I reviewed Urochart notes.  Impression/Assessment/plan: Fever, possible right pyelonephritis, enlarging right renal stones, possible mild and/or intermittent obstruction- I discussed with the patient and I called his sister and went over the nature risks benefits and alternatives to cystoscopy with right retrograde pyelogram and right ureteral stent placement with follow-up right ureteroscopy, holmium laser lithotripsy in a few weeks.  I drew him a picture of the anatomy and CT findings.  We discussed the rationale for a staged procedure.  We discussed the right renal stones are enlarging and could cause obstruction and infection in the future and also be harder to remove.  We did discuss cardiovascular complications with surgery and anesthesia as well as his known small capacity bladder and bladder inflammation which might limit retrograde access.  All questions answered.  He elects to proceed with cystoscopy and right ureteral stent.  Samuel [name] 12/11/2017, 11:01 AM   

## 2017-12-11 NOTE — Transfer of Care (Signed)
Immediate Anesthesia Transfer of Care Note  Patient: Samuel [name]  Procedure(s) Performed: CYSTOSCOPY WITH RETROGRADE PYELOGRAM, URETEROSCOPY AND STENT PLACEMENT RIGHT (Right Ureter)  Patient Location: PACU  Anesthesia Type:General  Level of Consciousness: sedated  Airway & Oxygen Therapy: Patient Spontanous Breathing and Patient connected to face mask oxygen  Post-op Assessment: Report given to RN and Post -op Vital signs reviewed and stable  Post vital signs: Reviewed and stable  Last Vitals:  Vitals Value Taken Time  BP    Temp    Pulse    Resp    SpO2      Last Pain:  Vitals:   12/11/17 1345  TempSrc:   PainSc: 0-No pain      Patients Stated Pain Goal: 4 (12/11/17 1345)  Complications: No apparent anesthesia complications

## 2017-12-11 NOTE — Discharge Instructions (Signed)
Ureteral Stent Implantation, Care After Refer to this sheet in the next few weeks. These instructions provide you with information about caring for yourself after your procedure. Your health care provider may also give you more specific instructions. Your treatment has been planned according to current medical practices, but problems sometimes occur. Call your health care provider if you have any problems or questions after your procedure.  Be sure to follow-up with Dr. Alvester Morin for stent/stone removal.   What can I expect after the procedure? After the procedure, it is common to have:  Nausea.  Mild pain when you urinate. You may feel this pain in your lower back or lower abdomen. Pain should stop within a few minutes after you urinate. This may last for up to 1 week.  A small amount of blood in your urine for several days.  Follow these instructions at home:  Medicines  Take over-the-counter and prescription medicines only as told by your health care provider.  If you were prescribed an antibiotic medicine, take it as told by your health care provider. Do not stop taking the antibiotic even if you start to feel better.  Do not drive for 24 hours if you received a sedative.  Do not drive or operate heavy machinery while taking prescription pain medicines. Activity  Return to your normal activities as told by your health care provider. Ask your health care provider what activities are safe for you.  Do not lift anything that is heavier than 10 lb (4.5 kg). Follow this limit for 1 week after your procedure, or for as long as told by your health care provider. General instructions  Watch for any blood in your urine. Call your health care provider if the amount of blood in your urine increases.  If you have a catheter: ? Follow instructions from your health care provider about taking care of your catheter and collection bag. ? Do not take baths, swim, or use a hot tub until your health  care provider approves.  Drink enough fluid to keep your urine clear or pale yellow.  Keep all follow-up visits as told by your health care provider. This is important. Contact a health care provider if:  You have pain that gets worse or does not get better with medicine, especially pain when you urinate.  You have difficulty urinating.  You feel nauseous or you vomit repeatedly during a period of more than 2 days after the procedure. Get help right away if:  Your urine is dark red or has blood clots in it.  You are leaking urine (have incontinence).  The end of the stent comes out of your urethra.  You cannot urinate.  You have sudden, sharp, or severe pain in your abdomen or lower back.  You have a fever. This information is not intended to replace advice given to you by your health care provider. Make sure you discuss any questions you have with your health care provider. Document Released: 04/21/2013 Document Revised: 01/25/2016 Document Reviewed: 03/03/2015 Elsevier Interactive Patient Education  [employer].

## 2017-12-11 NOTE — Progress Notes (Signed)
Pt. States he has occasional bladder spasms causing him to leak urine from his penis. Condom catheter applied per patient request. Will continue to monitor.

## 2017-12-11 NOTE — Progress Notes (Signed)
PROGRESS NOTE    Samuel [name]  ZOX:[id number] DOB: 04/29/1961 DOA: 12/09/2017 PCP: Barbie Banner, MD   Brief Narrative:  57 year old with past medical history relevant for relapsing remitting multiple sclerosis complicated by right upper extremity paralysis, bilateral lower extremity paralysis and multiple contractures, neurogenic bladder status post suprapubic catheter placement, nephrolithiasis, hypertension, questionable seizure disorder, hypothyroidism, chronic pain admitted with nausea, vomiting, abdominal pain and fever concerning for possible UTI and found to have a large right ureteral stone.   Assessment & Plan:   Active Problems:   Sepsis (HCC)   #) Sepsis due to right-sided pyelonephritis due to calculi on right: CT scan shows right pyelonephritis and large right renal stone with possible obstruction -Urology consulted, pending cystoscopy and possible right ureteral stent today -Continue meropenem 1 g every 8 started 12/10/2017 -Follow-up urine culture ordered today 12/10/2017 - blood cultures x2 ordered 12/09/2017 growing GNR pending speciation  #) Relapsing remitting multiple sclerosis: -Patient is on infusions as an outpatient  #) Multiple contractures/spasms: -Continue baclofen 40 mg 3 times daily -Continue diazepam 5 mg 3 times daily -Continue tizanidine 8 mg 3 times daily  #) Neurogenic bladder status post suprapubic catheter: -Continue mirabegron 50 mg daily -Continue oxybutynin 15 mg daily - Patient is an outpatient Botox injections  #) Seizure disorder: Patient reports that he is currently not on levetiracetam however this is still on his medication list.  He apparently had some seizure-like activity as a side effect to medication. -Hold levetiracetam 500 mg twice daily  #) Pain/psych: -Continue perphenazine 2 mg twice daily - Continue venlafaxine 150mg  every morning  #) Hypertension: - Continue lisinopril 5 mg daily and 6.25 mg of HCTZ daily  #)  Hypothyroidism: -Continue levothyroxine 125 mcg daily  Fluids: Gentle IV fluids Electrolytes: Monitor and supplement Nutrition: Regular diet  Prophylaxis: Enoxaparin  Disposition: Pending oral antibiotics and placement of ureteral stent  Full code   Consultants:   None  Procedures: (Don't include imaging studies which can be auto populated. Include things that cannot be auto populated i.e. Echo, Carotid and venous dopplers, Foley, Bipap, HD, tubes/drains, wound vac, central lines etc)  12/11/2017 cystoscopy with right ureteral stent: Pending  Antimicrobials: (specify start and planned stop date. Auto populated tables are space occupying and do not give end dates)  IV meropenem started 12/10/2017   Subjective: Patient reports that he is feeling much better.  He denies any nausea, vomiting, abdominal pain.  He was told about the CT scan showing large right renal stone and that urology will come by and discuss options with him.  Objective: Vitals:   12/10/17 0534 12/10/17 1202 12/10/17 2145 12/11/17 0536  BP: 90/65 104/68 (!) 102/58 116/68  Pulse: 71 68 73 65  Resp: 18  18 18   Temp: 98.1 F (36.7 C) 97.7 F (36.5 C) 98.7 F (37.1 C) 99.4 F (37.4 C)  TempSrc: Oral Oral Oral Oral  SpO2: 96% 98% 95% 97%  Weight:      Height:        Intake/Output Summary (Last 24 hours) at 12/11/2017 1228 Last data filed at 12/11/2017 8119 Gross per 24 hour  Intake 2515 ml  Output 1425 ml  Net 1090 ml   Filed Weights   12/10/17 0014  Weight: 72.8 kg (160 lb 7.9 oz)    Examination:  General exam: Appears calm and comfortable  Respiratory system: Clear to auscultation over anterior lung fields. Respiratory effort normal. Cardiovascular system: Regular rate and rhythm, no murmurs Gastrointestinal system: Soft,  nondistended, plus bowel sounds, no organomegaly Central nervous system: Alert and oriented.  Right upper extremity flexed and contractured, bilateral lower extremities  extended with bilateral plantar flexion of feet, atrophy and shiny bilaterally, no edema, diminished pulses in lower extremities Extremities: No lower extremity edema, shiny lower extremities, bilateral contractures Skin: No rashes on visible skin, suprapubic site is clean dry intact Psychiatry: Judgement and insight appear normal. Mood & affect appropriate.     Data Reviewed: I have personally reviewed following labs and imaging studies  CBC: Recent Labs  Lab 12/09/17 1941 12/10/17 0447 12/11/17 0435  WBC 25.4* 21.5* 11.5*  NEUTROABS 22.4  --   --   HGB 16.2 13.2 12.8*  HCT 48.0 39.8 40.5  MCV 88.2 88.8 90.8  PLT 243 214 173   Basic Metabolic Panel: Recent Labs  Lab 12/09/17 1941 12/10/17 0447  NA 137 140  K 3.3* 3.5  CL 99* 106  CO2 24 25  GLUCOSE 125* 162*  BUN 11 10  CREATININE 0.57* 0.72  CALCIUM 9.6 8.8*   GFR: Estimated Creatinine Clearance: 104.9 mL/min (by C-G formula based on SCr of 0.72 mg/dL). Liver Function Tests: Recent Labs  Lab 12/09/17 1941  AST 23  ALT 31  ALKPHOS 120  BILITOT 1.0  PROT 7.8  ALBUMIN 4.2   No results for input(s): LIPASE, AMYLASE in the last 168 hours. No results for input(s): AMMONIA in the last 168 hours. Coagulation Profile: No results for input(s): INR, PROTIME in the last 168 hours. Cardiac Enzymes: No results for input(s): CKTOTAL, CKMB, CKMBINDEX, TROPONINI in the last 168 hours. BNP (last 3 results) No results for input(s): PROBNP in the last 8760 hours. HbA1C: No results for input(s): HGBA1C in the last 72 hours. CBG: No results for input(s): GLUCAP in the last 168 hours. Lipid Profile: No results for input(s): CHOL, HDL, LDLCALC, TRIG, CHOLHDL, LDLDIRECT in the last 72 hours. Thyroid Function Tests: No results for input(s): TSH, T4TOTAL, FREET4, T3FREE, THYROIDAB in the last 72 hours. Anemia Panel: No results for input(s): VITAMINB12, FOLATE, FERRITIN, TIBC, IRON, RETICCTPCT in the last 72 hours. Sepsis  Labs: Recent Labs  Lab 12/09/17 1941 12/09/17 2127  PROCALCITON 0.49  --   LATICACIDVEN  --  1.86    Recent Results (from the past 240 hour(s))  Blood Culture (routine x 2)     Status: None (Preliminary result)   Collection Time: 12/09/17  7:35 PM  Result Value Ref Range Status   Specimen Description   Final    BLOOD RIGHT HAND Performed at [facility], [address], [place], [place] [address]    Special Requests   Final    BOTTLES DRAWN AEROBIC AND ANAEROBIC Blood Culture results may not be optimal due to an excessive volume of blood received in culture bottles Performed at [facility], [address], [place], [place] [address]    Culture  Setup Time   Final    GRAM NEGATIVE RODS AEROBIC BOTTLE ONLY Organism ID to follow CRITICAL RESULT CALLED TO, READ BACK BY AND VERIFIED WITH: [place] Party PharmD 15:40 12/10/17 ([email or web address])    Culture   Final    Romie Minus NEGATIVE RODS IDENTIFICATION AND SUSCEPTIBILITIES TO FOLLOW Performed at [facility], [address], [place], [place] [address]    Report Status PENDING  Incomplete  Blood Culture ID Panel (Reflexed)     Status: Abnormal   Collection Time: 12/09/17  7:35 PM  Result Value Ref Range Status  Enterococcus species NOT DETECTED NOT DETECTED Final   Listeria monocytogenes NOT DETECTED NOT DETECTED Final   Staphylococcus species NOT DETECTED NOT DETECTED Final   Staphylococcus aureus NOT DETECTED NOT DETECTED Final   Streptococcus species NOT DETECTED NOT DETECTED Final   Streptococcus agalactiae NOT DETECTED NOT DETECTED Final   Streptococcus pneumoniae NOT DETECTED NOT DETECTED Final   Streptococcus pyogenes NOT DETECTED NOT DETECTED Final   Acinetobacter baumannii NOT DETECTED NOT DETECTED Final   Enterobacteriaceae species DETECTED (A) NOT DETECTED Final    Comment: Enterobacteriaceae represent a large family of gram negative bacteria, not a single organism. Refer to culture for  further identification. CRITICAL RESULT CALLED TO, READ BACK BY AND VERIFIED WITH: [place] Party PharmD 15:40 12/10/17 ([email or web address])    Enterobacter cloacae complex NOT DETECTED NOT DETECTED Final   Escherichia coli NOT DETECTED NOT DETECTED Final   Klebsiella oxytoca NOT DETECTED NOT DETECTED Final   Klebsiella pneumoniae NOT DETECTED NOT DETECTED Final   Proteus species NOT DETECTED NOT DETECTED Final   Serratia marcescens NOT DETECTED NOT DETECTED Final   Carbapenem resistance NOT DETECTED NOT DETECTED Final   Haemophilus influenzae NOT DETECTED NOT DETECTED Final   Neisseria meningitidis NOT DETECTED NOT DETECTED Final   Pseudomonas aeruginosa NOT DETECTED NOT DETECTED Final   Candida albicans NOT DETECTED NOT DETECTED Final   Candida glabrata NOT DETECTED NOT DETECTED Final   Candida krusei NOT DETECTED NOT DETECTED Final   Candida parapsilosis NOT DETECTED NOT DETECTED Final   Candida tropicalis NOT DETECTED NOT DETECTED Final    Comment: Performed at [facility], [address] [place], [place] [address]  Culture, Urine     Status: None (Preliminary result)   Collection Time: 12/10/17 10:10 AM  Result Value Ref Range Status   Specimen Description   Final    URINE, SUPRAPUBIC Performed at [facility], [address], [place], [place] [address]    Special Requests   Final    NONE Performed at [facility], [address], [place], [place] [address]    Culture   Final    CULTURE REINCUBATED FOR BETTER GROWTH Performed at [facility], [address], [place], [place] [address]    Report Status PENDING  Incomplete         Radiology Studies: Ct Abdomen Pelvis Wo Contrast  Result Date: 12/10/2017 CLINICAL DATA:  Nausea vomiting and fever since this morning. History of kidney stones. EXAM: CT ABDOMEN AND PELVIS WITHOUT CONTRAST TECHNIQUE: Multidetector CT imaging of the abdomen and pelvis was performed following the standard protocol  without IV contrast. COMPARISON:  10/11/2015 FINDINGS: Lower chest: There is dependent opacity in the lower lobes consistent with atelectasis. No convincing pneumonia or pulmonary edema. Heart is normal in size. Hepatobiliary: Liver demonstrates diffuse decreased attenuation consistent with fatty infiltration. No liver mass or focal lesion. Gallbladder is unremarkable. No bile duct dilation. Pancreas: Unremarkable. No pancreatic ductal dilatation or surrounding inflammatory changes. Spleen: Normal in size without focal abnormality. Adrenals/Urinary Tract: No adrenal masses. Mild prominence of the right intrarenal collecting system. There are intrarenal stones on the right, largest a right renal pelvis stone measuring 1.1 cm. Mild right perinephric stranding. No left intrarenal stones. No left hydronephrosis. No renal masses on either side. Both ureters are normal in course and in caliber. Stranding extends along the proximal to mid right ureter along the posterior, inferior pararenal fascia. No ureteral stones. Bladder is decompressed by a suprapubic Foley catheter.  No bladder mass or stone. Stomach/Bowel: Moderate increased stool distends the rectum. There is increased stool, mild-to-moderate, throughout the colon. No rectal or colonic wall thickening. No inflammation. Stomach and small bowel are unremarkable. Portions of a normal appendix are visualized. Vascular/Lymphatic: Minor aortic atherosclerosis.  No adenopathy. Reproductive: Unremarkable. Other: No abdominal wall hernia.  No ascites. Musculoskeletal: No fracture or acute finding. No osteoblastic or osteolytic lesions. IMPRESSION: 1. Mild prominence of the right intrarenal collecting system. There are right intrarenal stones, largest a renal pelvis stone measuring 11 mm. This stone may be causing intermittent UPJ obstruction. This is supported by mild right perinephric stranding. 2. No ureteral stones.  No left intrarenal stones. 3. No other evidence of an  acute abnormality. 4. Hepatic steatosis. 5. Increased stool in the colon. Increased stool, moderate, distends the rectum. No bowel inflammation or obstruction. 6. Mild aortic atherosclerosis. Electronically Signed   By: Amie Portland M.D.   On: 12/10/2017 15:43   Dg Chest 1 View  Result Date: 12/09/2017 CLINICAL DATA:  Nausea and vomiting EXAM: CHEST  1 VIEW COMPARISON:  10/10/2015 FINDINGS: Low lung volumes. No consolidation or effusion. Normal heart size. No pneumothorax. IMPRESSION: No active disease.  Low lung volumes. Electronically Signed   By: Jasmine Pang M.D.   On: 12/09/2017 20:34        Scheduled Meds: . baclofen  40 mg Oral TID  . diazepam  5 mg Oral TID  . docusate sodium  250 mg Oral Daily  . enoxaparin (LOVENOX) injection  40 mg Subcutaneous QHS  . hydrochlorothiazide  6.25 mg Oral Daily   And  . lisinopril  5 mg Oral Daily  . levothyroxine  125 mcg Oral QAC breakfast  . mirabegron ER  50 mg Oral Daily  . oxybutynin  15 mg Oral Daily  . pantoprazole  40 mg Oral Daily  . perphenazine  2 mg Oral BID  . phenazopyridine  200 mg Oral On Call  . tiZANidine  8 mg Oral TID  . trazodone  300 mg Oral QHS  . venlafaxine XR  75 mg Oral Q breakfast   Continuous Infusions: . sodium chloride 75 mL/hr at 12/11/17 0340  . meropenem (MERREM) IV 1 g (12/11/17 0536)     LOS: 2 days    Time spent: 35    Delaine Lame, MD [facility]   If 7PM-7AM, please contact night-coverage [email or web address] Password [facility] 12/11/2017, 12:28 PM

## 2017-12-11 NOTE — Telephone Encounter (Signed)
Pt's sister called to advise he is in the hospital and will probably be there for the next couple of days for a possible bladder infection. Pt's last ocrevus was week before last (FYI).

## 2017-12-11 NOTE — Anesthesia Postprocedure Evaluation (Signed)
Anesthesia Post Note  Patient: Samuel [name]  Procedure(s) Performed: CYSTOSCOPY WITH RETROGRADE PYELOGRAM, URETEROSCOPY AND STENT PLACEMENT RIGHT (Right Ureter)     Patient location during evaluation: PACU Anesthesia Type: General Level of consciousness: awake and alert Pain management: pain level controlled Vital Signs Assessment: post-procedure vital signs reviewed and stable Respiratory status: spontaneous breathing, nonlabored ventilation, respiratory function stable and patient connected to nasal cannula oxygen Cardiovascular status: blood pressure returned to baseline and stable Postop Assessment: no apparent nausea or vomiting Anesthetic complications: no    Last Vitals:  Vitals:   12/11/17 1700 12/11/17 1725  BP: (!) 146/86 (!) 141/76  Pulse: 87 88  Resp: 20 (!) 21  Temp: 37.2 C 37.2 C  SpO2: 95% 95%    Last Pain:  Vitals:   12/11/17 1700  TempSrc:   PainSc: 0-No pain                 Raechel Marcos A.

## 2017-12-11 NOTE — H&P (View-Only) (Signed)
Consultation: Right renal stone, fever and bacteremia Requested by: Dr. S.C. Purohit     History of Present Illness: 57-year-old white male with a history of MS and suprapubic tube recently saw Dr. Bell in the office.  He was admitted with lethargy, nausea vomiting and fever.  CT scan revealed enlarging right renal stone of about 11 mm with an irregular 6 mm stone above this and mild dilation of the collecting system more so of the upper pole collecting system, and some peri-nephric stranding concerning for obstruction and infection.  The patient has improved with IV fluids and IV antibiotics.  He denies any flank pain.  The right stones were seen on ultrasound December 2018, but they were only measuring about 7 mm.  He continues to have fevers.  Past Medical History:  Diagnosis Date  . Bladder calculi   . Chronic back pain   . Chronic fatigue   . Foley catheter in place   . Gait disorder   . GERD (gastroesophageal reflux disease)   . Hyperlipidemia   . Hypertension   . Hypothyroidism   . Incontinence of urine   . Multiple sclerosis (HCC) NEUROLOGIST-- DR WILLIS   dx 1993, JC virus negative 2/14---  CURRENTLY RECEIVING TYSABRI IV TREATMENT  . Neurogenic bladder   . Neurogenic bowel    intermittant constipation and diarrhea  . Pneumonia    dx  03-03-2014--  admitted for iv and oral antibiotics  . Shortness of breath    secondary to fatique  . 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 Urinary retention   . Vitamin D deficiency   . Yeast infection    genital area   Past Surgical History:  Procedure Laterality Date  . APPENDECTOMY  age 13  . CYSTOSCOPY N/A 03/21/2014   Procedure: CYSTOSCOPY TREATMENT OF BLADDER CALCULI;  Surgeon: David S Grapey, MD;  Location: [facility];  Service: Urology;  Laterality: N/A;  . INSERTION OF SUPRAPUBIC CATHETER N/A 03/21/2014   Procedure: INSERTION OF SUPRAPUBIC CATHETER;  Surgeon: David S Grapey, MD;  Location: [facility];  Service: Urology;  Laterality: N/A;  . NASAL SEPTUM SURGERY  1996  . NEGATIVE SLEEP STUDY  1996  . TRANSTHORACIC ECHOCARDIOGRAM  02-07-2004   MILD LVH/  EF 55-65%    Home Medications:  Facility-Administered Medications Prior to Admission  Medication Dose Route Frequency Provider Last Rate Last Dose  . botulinum toxin Type A (BOTOX) injection 300 Units  300 Units Intramuscular Once Yan, Yijun, MD       Medications Prior to Admission  Medication Sig Dispense Refill Last Dose  . acetic acid 0.25 % irrigation Irrigate with as directed daily. Instill 30ml and clamp tube for 30 minutes then drain. 500 mL 12 12/08/2017 at Unknown time  . baclofen (LIORESAL) 20 MG tablet TAKE TWO TABLETS BY MOUTH THREE TIMES DAILY 540 tablet 3 12/09/2017 at Unknown time  . diazepam (VALIUM) 5 MG tablet Take 1 tablet (5 mg total) by mouth 3 (three) times daily. 270 tablet 1 12/09/2017 at Unknown time  . docusate sodium (COLACE) 250 MG capsule Take 250 mg by mouth daily.   12/09/2017 at Unknown time  . HYDROcodone-acetaminophen (NORCO/VICODIN) 5-325 MG tablet Take 1 tablet by mouth every 6 (six) hours as needed. 30 tablet 0 12/09/2017 at Unknown time  . levETIRAcetam (KEPPRA) 500 MG tablet Take 1 tablet (500 mg total) by mouth 2 (two) times daily. 60 tablet 0 12/09/2017 at Unknown time  . levothyroxine (SYNTHROID, LEVOTHROID)   125 MCG tablet Take 125 mcg by mouth daily.   12/09/2017 at Unknown time  . lisinopril-hydrochlorothiazide (PRINZIDE,ZESTORETIC) 10-12.5 MG tablet Take 0.5 tablets by mouth every morning.   12/09/2017 at Unknown time  . mirabegron ER (MYRBETRIQ) 50 MG TB24 tablet Take 50 mg by mouth daily.   12/09/2017 at Unknown time  . omeprazole (PRILOSEC) 20 MG capsule Take 20 mg by mouth every morning.    12/09/2017 at Unknown time  . oxybutynin (DITROPAN XL) 15 MG 24 hr tablet Take 15 mg by mouth daily.    12/09/2017 at Unknown time  . perphenazine (TRILAFON) 2 MG tablet TAKE 1 TABLET BY MOUTH TWICE DAILY 60 tablet 5  12/09/2017 at Unknown time  . phenazopyridine (PYRIDIUM) 97 MG tablet Take 97 mg by mouth 2 (two) times daily.   12/09/2017 at Unknown time  . scopolamine (TRANSDERM-SCOP, 1.5 MG,) 1 MG/3DAYS Place 1 patch (1.5 mg total) onto the skin every 3 (three) days. 10 patch 3 not used  . tiZANidine (ZANAFLEX) 4 MG tablet TAKE TWO TABLETS BY MOUTH THREE TIMES DAILY 540 tablet 1 12/09/2017 at Unknown time  . traMADol (ULTRAM) 50 MG tablet TAKE 1 TABLET BY MOUTH EVERY 6 HOURS AS NEEDED (Patient taking differently: TAKE 1 TABLET BY MOUTH EVERY 6 HOURS AS NEEDED FOR PAIN) 60 tablet 5 Past Week at Unknown time  . trazodone (DESYREL) 300 MG tablet TAKE 1 TABLET BY MOUTH ONCE DAILY AT BEDTIME 30 tablet 5 12/08/2017 at Unknown time  . venlafaxine XR (EFFEXOR-XR) 75 MG 24 hr capsule TAKE 2 CAPSULES BY MOUTH ONCE DAILY WITH BREAKFAST 180 capsule 0 12/09/2017 at Unknown time  . Vitamin D, Ergocalciferol, (DRISDOL) 50000 UNITS CAPS Take 50,000 Units by mouth every 7 (seven) days. Friday only   Past Week at Unknown time   Allergies:  Allergies  Allergen Reactions  . Penicillins Other (See Comments)    Unknown childhoood reaction (takes amoxicillian with reaction) Has tolerated ceftriaxone, cefepime, and Primaxin during inpatient admissions    Family History  Problem Relation Age of Onset  . Stroke Father   . Cirrhosis Brother   . Hypothyroidism Sister    Social History:  reports that he quit smoking about 32 years ago. His smoking use included cigarettes. He has a 12.00 pack-year smoking history. He has never used smokeless tobacco. He reports that he does not drink alcohol or use drugs.  ROS: A complete review of systems was performed.  All systems are negative except for pertinent findings as noted. Review of Systems  Neurological: Positive for weakness.  All other systems reviewed and are negative.    Physical Exam:  Vital signs in last 24 hours: Temp:  [97.7 F (36.5 C)-99.4 F (37.4 C)] 99.4 F (37.4 C)  (04/11 0536) Pulse Rate:  [65-73] 65 (04/11 0536) Resp:  [18] 18 (04/11 0536) BP: (102-116)/(58-68) 116/68 (04/11 0536) SpO2:  [95 %-98 %] 97 % (04/11 0536) General:  Alert and oriented, No acute distress HEENT: Normocephalic, atraumatic Cardiovascular: Regular rate and rhythm Lungs: Regular rate and effort Abdomen: Soft, nontender, nondistended, no abdominal masses, SP tube in place with clear urine Back: No CVA tenderness Extremities: No edema Neurologic: Limited movement of his hands, no movement of the lower extremities.  Laboratory Data:  Results for orders placed or performed during the hospital encounter of 12/09/17 (from the past 24 hour(s))  CBC     Status: Abnormal   Collection Time: 12/11/17  4:35 AM  Result Value Ref Range   WBC   11.5 (H) 4.0 - 10.5 K/uL   RBC 4.46 4.22 - 5.81 MIL/uL   Hemoglobin 12.8 (L) 13.0 - 17.0 g/dL   HCT 40.5 39.0 - 52.0 %   MCV 90.8 78.0 - 100.0 fL   MCH 28.7 26.0 - 34.0 pg   MCHC 31.6 30.0 - 36.0 g/dL   RDW 13.8 11.5 - 15.5 %   Platelets 173 150 - 400 K/uL   Recent Results (from the past 240 hour(s))  Blood Culture (routine x 2)     Status: None (Preliminary result)   Collection Time: 12/09/17  7:35 PM  Result Value Ref Range Status   Specimen Description   Final    BLOOD RIGHT HAND Performed at [facility], [address] [place], [place] [address]    Special Requests   Final    BOTTLES DRAWN AEROBIC AND ANAEROBIC Blood Culture results may not be optimal due to an excessive volume of blood received in culture bottles Performed at [facility], [address] [place], [place] [address]    Culture  Setup Time   Final    GRAM NEGATIVE RODS AEROBIC BOTTLE ONLY Organism ID to follow CRITICAL RESULT CALLED TO, READ BACK BY AND VERIFIED WITH: D. Zeigler PharmD 15:40 12/10/17 ([email or web address])    Culture   Final    GRAM NEGATIVE RODS IDENTIFICATION AND SUSCEPTIBILITIES TO FOLLOW Performed at [facility], [address] [place], [place] [address]    Report Status PENDING  Incomplete  Blood Culture ID Panel (Reflexed)     Status: Abnormal   Collection Time: 12/09/17  7:35 PM  Result Value Ref Range Status   Enterococcus species NOT DETECTED NOT DETECTED Final   Listeria monocytogenes NOT DETECTED NOT DETECTED Final   Staphylococcus species NOT DETECTED NOT DETECTED Final   Staphylococcus aureus NOT DETECTED NOT DETECTED Final   Streptococcus species NOT DETECTED NOT DETECTED Final   Streptococcus agalactiae NOT DETECTED NOT DETECTED Final   Streptococcus pneumoniae NOT DETECTED NOT DETECTED Final   Streptococcus pyogenes NOT DETECTED NOT DETECTED Final   Acinetobacter baumannii NOT DETECTED NOT DETECTED Final   Enterobacteriaceae species DETECTED (A) NOT DETECTED Final    Comment: Enterobacteriaceae represent a large family of gram negative bacteria, not a single organism. Refer to culture for further identification. CRITICAL RESULT CALLED TO, READ BACK BY AND VERIFIED WITH: D. Zeigler PharmD 15:40 12/10/17 ([email or web address])    Enterobacter cloacae complex NOT DETECTED NOT DETECTED Final   Escherichia coli NOT DETECTED NOT DETECTED Final   Klebsiella oxytoca NOT DETECTED NOT DETECTED Final   Klebsiella pneumoniae NOT DETECTED NOT DETECTED Final   Proteus species NOT DETECTED NOT DETECTED Final   Serratia marcescens NOT DETECTED NOT DETECTED Final   Carbapenem resistance NOT DETECTED NOT DETECTED Final   Haemophilus influenzae NOT DETECTED NOT DETECTED Final   Neisseria meningitidis NOT DETECTED NOT DETECTED Final   Pseudomonas aeruginosa NOT DETECTED NOT DETECTED Final   Candida albicans NOT DETECTED NOT DETECTED Final   Candida glabrata NOT DETECTED NOT DETECTED Final   Candida krusei NOT DETECTED NOT DETECTED Final   Candida parapsilosis NOT DETECTED NOT DETECTED Final   Candida tropicalis NOT DETECTED NOT DETECTED Final    Comment: Performed at [facility], [address] [place], [place] [address]  Culture, Urine     Status: None (Preliminary result)   Collection Time: 12/10/17 10:10 AM  Result Value Ref Range Status   Specimen Description     Final    URINE, SUPRAPUBIC Performed at [facility], [address] [place], [place] [address]    Special Requests   Final    NONE Performed at [facility], [address] [place], [place] [address]    Culture   Final    CULTURE REINCUBATED FOR BETTER GROWTH Performed at [facility], [address] , [place] [address]    Report Status PENDING  Incomplete   Creatinine: Recent Labs    12/09/17 1941 12/10/17 0447  CREATININE 0.57* 0.72   CT - I reviewed the images. I reviewed US images.   I reviewed Urochart notes.  Impression/Assessment/plan: Fever, possible right pyelonephritis, enlarging right renal stones, possible mild and/or intermittent obstruction- I discussed with the patient and I called his sister and went over the nature risks benefits and alternatives to cystoscopy with right retrograde pyelogram and right ureteral stent placement with follow-up right ureteroscopy, holmium laser lithotripsy in a few weeks.  I drew him a picture of the anatomy and CT findings.  We discussed the rationale for a staged procedure.  We discussed the right renal stones are enlarging and could cause obstruction and infection in the future and also be harder to remove.  We did discuss cardiovascular complications with surgery and anesthesia as well as his known small capacity bladder and bladder inflammation which might limit retrograde access.  All questions answered.  He elects to proceed with cystoscopy and right ureteral stent.  Sharvil Hoey 12/11/2017, 11:01 AM   

## 2017-12-11 NOTE — Anesthesia Preprocedure Evaluation (Addendum)
Anesthesia Evaluation  Patient identified by MRN, date of birth, ID band Patient awake    Reviewed: Allergy & Precautions, H&P , NPO status , Patient's Chart, lab work & pertinent test results, reviewed documented beta blocker date and time   Airway Mallampati: II  TM Distance: >3 FB Neck ROM: full    Dental no notable dental hx. (+) Teeth Intact   Pulmonary shortness of breath, with exertion, at rest and lying, pneumonia, resolved, former smoker,    Pulmonary exam normal breath sounds clear to auscultation       Cardiovascular hypertension, Pt. on medications  Rhythm:Regular Rate:Normal     Neuro/Psych Seizures -, Well Controlled,  Hx/o MS Spastic hemiplegia right side negative psych ROS   GI/Hepatic Neg liver ROS, GERD  Medicated and Controlled,  Endo/Other  Hypothyroidism Protein calorie malnutrition  Renal/GU Hx/o renal calculi urosepsis in setting of right renal stone Bladder dysfunction  Indwelling suprapubic catheter Neurogenic bladder    Musculoskeletal Flexion Contracture RUE   Abdominal   Peds  Hematology  (+) anemia ,   Anesthesia Other Findings   Reproductive/Obstetrics                         Anesthesia Physical Anesthesia Plan  ASA: II  Anesthesia Plan: General   Post-op Pain Management:    Induction: Intravenous  PONV Risk Score and Plan: 4 or greater and Ondansetron, Midazolam, Dexamethasone and Treatment may vary due to age or medical condition  Airway Management Planned: LMA  Additional Equipment:   Intra-op Plan:   Post-operative Plan: Extubation in OR  Informed Consent: I have reviewed the patients History and Physical, chart, labs and discussed the procedure including the risks, benefits and alternatives for the proposed anesthesia with the patient or authorized representative who has indicated his/her understanding and acceptance.   Dental Advisory Given  and Dental advisory given  Plan Discussed with: CRNA, Surgeon and Anesthesiologist  Anesthesia Plan Comments: ( )       Anesthesia Quick Evaluation

## 2017-12-12 ENCOUNTER — Encounter (HOSPITAL_COMMUNITY): Payer: Self-pay | Admitting: Urology

## 2017-12-12 LAB — CULTURE, BLOOD (ROUTINE X 2)

## 2017-12-12 MED ORDER — CEPHALEXIN 500 MG PO CAPS: 1000.0000 mg | ORAL_CAPSULE | Freq: Three times a day (TID) | ORAL | 0 refills | Status: AC

## 2017-12-12 MED ORDER — TRAMADOL HCL 50 MG PO TABS: 50.0000 mg | ORAL_TABLET | Freq: Four times a day (QID) | ORAL | 0 refills | Status: DC | PRN

## 2017-12-12 MED ORDER — POLYVINYL ALCOHOL 1.4 % OP SOLN
1.0000 [drp] | OPHTHALMIC | Status: DC | PRN
  Administered 2017-12-12: 1 [drp] via OPHTHALMIC
  Filled 2017-12-12 (×2): qty 15

## 2017-12-12 NOTE — Progress Notes (Signed)
PT[facility] called  

## 2017-12-12 NOTE — Progress Notes (Signed)
CM called to assist in sitting up [facility] for transport home, pt's family in room, asked to verify address: [address] [name], was information given, verified with family x 2 as it was different from address on facesheet and was told patient is now living with his mother, CM was on phone with me at the time and is aware

## 2017-12-12 NOTE — Care Management Important Message (Signed)
Important Message  Patient Details  Name: NURI LARMER MRN: [id number] Date of Birth: Nov 11, 1960   Medicare Important Message Given:  Yes    Caren Macadam 12/12/2017, 10:46 AMImportant Message  Patient Details  Name: CATHY ROPP MRN: [id number] Date of Birth: 05/05/1961   Medicare Important Message Given:  Yes    Caren Macadam 12/12/2017, 10:45 AM

## 2017-12-12 NOTE — Discharge Summary (Signed)
Physician Discharge Summary  Samuel [name] ZOX:[id number] DOB: February 15, 1961 DOA: 12/09/2017  PCP: Barbie Banner, MD  Admit date: 12/09/2017 Discharge date: 12/12/2017  Admitted From: Home Disposition: Home  Recommendations for Outpatient Follow-up:  1. Follow up with PCP in 1-2 weeks 2. Please obtain BMP/CBC in one week 3.  Home Health: Yes Equipment/Devices: Suprapubic catheter  Discharge Condition: Stable CODE STATUS: Full Diet recommendation: Regular  Brief/Interim Summary:  #) Citrobacter  krosei sepsis from urinary source: Patient was admitted with fevers, chills, nausea, vomiting, abdominal pain and elevated white count consistent with sepsis.  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  His blood cultures grew out Citrobacter species that he is grown in the past from his urine.  Noncontrasted CT performed on 12/10/2017 showed large 11 mm right renal calculi that was causing minimal hydronephrosis as well as right perinephric stranding.  Urology was consulted and placed a right ureteral stent on 12/11/2017.  His Citrobacter was pansensitive and he was discharged home on oral cephalexin thousand milligrams 3 times daily.  #) Relapsing remitting multiple sclerosis: Patient is outpatient in future  #) multiple contractures/spasms: Patient was continued on home baclofen, diazepam, tizanidine doses.  #) Neurogenic bladder status post suprapubic catheter: Patient was continued on mirabegron and oxybutynin.  #) Seizure disorder: Patient's medication list lists levetiracetam as a home medication however patient and his mom denied ever having seizures but rather a reaction to an antibiotic.  This was discontinued on discharge.  #) Pain/psych: Patient was continued on home fluphenazine and venlafaxine.  #) Hypertension: Patient was continued on home lisinopril and HCTZ.  Exline #) Hypothyroidism: Patient was continued on home  levothyroxine  Discharge Diagnoses:  Active Problems:   Sepsis [facility])    Discharge Instructions  Discharge Instructions    Call MD for:  difficulty breathing, headache or visual disturbances   Complete by:  As directed    Call MD for:  hives   Complete by:  As directed    Call MD for:  persistant dizziness or light-headedness   Complete by:  As directed    Call MD for:  persistant nausea and vomiting   Complete by:  As directed    Call MD for:  redness, tenderness, or signs of infection (pain, swelling, redness, odor or green/yellow discharge around incision site)   Complete by:  As directed    Call MD for:  severe uncontrolled pain   Complete by:  As directed    Call MD for:  temperature >100.4   Complete by:  As directed    Diet - low sodium heart healthy   Complete by:  As directed    Discharge instructions   Complete by:  As directed    Please follow-up with your PCP in 1 week.  Please follow-up with urologist in 1 week.   Increase activity slowly   Complete by:  As directed      Allergies as of 12/12/2017      Reactions   Penicillins Other (See Comments)   Unknown childhoood reaction (takes amoxicillian with reaction) Has tolerated ceftriaxone, cefepime, and Primaxin during inpatient admissions      Medication List    STOP taking these medications   levETIRAcetam 500 MG tablet Commonly known as:  KEPPRA   MYRBETRIQ 50 MG Tb24 tablet Generic drug:  mirabegron ER   oxybutynin 15 MG 24 hr tablet Commonly known as:  DITROPAN XL  phenazopyridine 97 MG tablet Commonly known as:  PYRIDIUM     TAKE these medications   acetic acid 0.25 % irrigation Irrigate with as directed daily. Instill 30ml and clamp tube for 30 minutes then drain.   baclofen 20 MG tablet Commonly known as:  LIORESAL TAKE TWO TABLETS BY MOUTH THREE TIMES DAILY   cephALEXin 500 MG capsule Commonly known as:  KEFLEX Take 2 capsules (1,000 mg total) by mouth 3 (three) times daily for 6  days.   diazepam 5 MG tablet Commonly known as:  VALIUM Take 1 tablet (5 mg total) by mouth 3 (three) times daily.   docusate sodium 250 MG capsule Commonly known as:  COLACE Take 250 mg by mouth daily.   HYDROcodone-acetaminophen 5-325 MG tablet Commonly known as:  NORCO/VICODIN Take 1 tablet by mouth every 6 (six) hours as needed.   levothyroxine 125 MCG tablet Commonly known as:  SYNTHROID, LEVOTHROID Take 125 mcg by mouth daily.   lisinopril-hydrochlorothiazide 10-12.5 MG tablet Commonly known as:  PRINZIDE,ZESTORETIC Take 0.5 tablets by mouth every morning.   omeprazole 20 MG capsule Commonly known as:  PRILOSEC Take 20 mg by mouth every morning.   perphenazine 2 MG tablet Commonly known as:  TRILAFON TAKE 1 TABLET BY MOUTH TWICE DAILY   scopolamine 1 MG/3DAYS Commonly known as:  TRANSDERM-SCOP (1.5 MG) Place 1 patch (1.5 mg total) onto the skin every 3 (three) days.   tiZANidine 4 MG tablet Commonly known as:  ZANAFLEX TAKE TWO TABLETS BY MOUTH THREE TIMES DAILY   traMADol 50 MG tablet Commonly known as:  ULTRAM Take 1 tablet (50 mg total) by mouth every 6 (six) hours as needed. What changed:    how much to take  how to take this  when to take this  reasons to take this   trazodone 300 MG tablet Commonly known as:  DESYREL TAKE 1 TABLET BY MOUTH ONCE DAILY AT BEDTIME   venlafaxine XR 75 MG 24 hr capsule Commonly known as:  EFFEXOR-XR TAKE 2 CAPSULES BY MOUTH ONCE DAILY WITH BREAKFAST   Vitamin D (Ergocalciferol) 50000 units Caps capsule Commonly known as:  DRISDOL Take 50,000 Units by mouth every 7 (seven) days. Friday only      Follow-up Information    Alvester Morin Jannett Celestine, MD.   Specialty:  Urology Why:  Office will call with appointment Contact information: [address] [place] [address] [phone]          Allergies  Allergen Reactions  . Penicillins Other (See Comments)    Unknown childhoood reaction (takes  amoxicillian with reaction) Has tolerated ceftriaxone, cefepime, and Primaxin during inpatient admissions    Consultations:  Neurology   Procedures/Studies: Ct Abdomen Pelvis Wo Contrast  Result Date: 12/10/2017 CLINICAL DATA:  Nausea vomiting and fever since this morning. History of kidney stones. EXAM: CT ABDOMEN AND PELVIS WITHOUT CONTRAST TECHNIQUE: Multidetector CT imaging of the abdomen and pelvis was performed following the standard protocol without IV contrast. COMPARISON:  10/11/2015 FINDINGS: Lower chest: There is dependent opacity in the lower lobes consistent with atelectasis. No convincing pneumonia or pulmonary edema. Heart is normal in size. Hepatobiliary: Liver demonstrates diffuse decreased attenuation consistent with fatty infiltration. No liver mass or focal lesion. Gallbladder is unremarkable. No bile duct dilation. Pancreas: Unremarkable. No pancreatic ductal dilatation or surrounding inflammatory changes. Spleen: Normal in size without focal abnormality. Adrenals/Urinary Tract: No adrenal masses. Mild prominence of the right intrarenal collecting system. There are intrarenal stones on the  right, largest a right renal pelvis stone measuring 1.1 cm. Mild right perinephric stranding. No left intrarenal stones. No left hydronephrosis. No renal masses on either side. Both ureters are normal in course and in caliber. Stranding extends along the proximal to mid right ureter along the posterior, inferior pararenal fascia. No ureteral stones. Bladder is decompressed by a suprapubic Foley catheter. No bladder mass or stone. Stomach/Bowel: Moderate increased stool distends the rectum. There is increased stool, mild-to-moderate, throughout the colon. No rectal or colonic wall thickening. No inflammation. Stomach and small bowel are unremarkable. Portions of a normal appendix are visualized. Vascular/Lymphatic: Minor aortic atherosclerosis.  No adenopathy. Reproductive: Unremarkable. Other: No  abdominal wall hernia.  No ascites. Musculoskeletal: No fracture or acute finding. No osteoblastic or osteolytic lesions. IMPRESSION: 1. Mild prominence of the right intrarenal collecting system. There are right intrarenal stones, largest a renal pelvis stone measuring 11 mm. This stone may be causing intermittent UPJ obstruction. This is supported by mild right perinephric stranding. 2. No ureteral stones.  No left intrarenal stones. 3. No other evidence of an acute abnormality. 4. Hepatic steatosis. 5. Increased stool in the colon. Increased stool, moderate, distends the rectum. No bowel inflammation or obstruction. 6. Mild aortic atherosclerosis. Electronically Signed   By: Amie Portland M.D.   On: 12/10/2017 15:43   Dg Chest 1 View  Result Date: 12/09/2017 CLINICAL DATA:  Nausea and vomiting EXAM: CHEST  1 VIEW COMPARISON:  10/10/2015 FINDINGS: Low lung volumes. No consolidation or effusion. Normal heart size. No pneumothorax. IMPRESSION: No active disease.  Low lung volumes. Electronically Signed   By: Jasmine Pang M.D.   On: 12/09/2017 20:34   Dg C-arm 1-60 Min-no Report  Result Date: 12/11/2017 Fluoroscopy was utilized by the requesting physician.  No radiographic interpretation.  12/11/2017 cystoscopy, right retrograde pyelogram, right ureteral stent placement, suprapubic tube change The penis was circumcised and without mass or lesion.  On cystoscopy the urethra and prostatic urethra were normal.  The bladder neck was very high and tight and a bit difficult to get the scope in the bladder.  The suprapubic tube was in good position.  The trigone contained bullous edema which is likely what Dr. Alvester Morin had plan to biopsy.  Orange urine was noted emanating from the area of the right trigone and the right ureteral orifice was located.  The left ureteral orifice was not located.  Right retrograde pyelogram- the ureter appeared normal.  There were filling defects in the renal pelvis consistent with both  stones,  minimal hydronephrosis.  Description of procedure: After consent was obtained the patient brought to the operating room.  After adequate anesthesia he was carefully placed in lithotomy position. 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  The suprapubic tube was removed.  I prepped the suprapubic tube site into the field.  Gloves were changed.  I then placed a new 18 French suprapubic tube and inflated the balloon to 10 cc.  The rigid cystoscope was passed per urethra and with some difficulty angled up over the bladder neck and into the bladder.  The suprapubic tube was in good position.  The trigone was covered in bullous edema but no obvious mass.  Visualization of the bladder was somewhat limited by the tight bladder neck.  I could see orange urine welling up in the area of the right trigone and a sensor wire was advanced in the right ureteral orifice was located.  I advanced a 6 [place] open-ended catheter into  the right distal ureter and retrograde injection of contrast was performed.  I then passed a sensor wire into the upper pole calyx to get around the stones.  Older more debris laden urine drained.  I then passed a 6 x 26 cm stent and remove the wire.  A good coil was seen in the upper pole calyx and a good coil in the bladder.  The bladder was irrigated continuously through the SP tube as there was some mild bleeding from the bladder neck due to the scope.  I then backed the scope out and left the SP tube to gravity drainage.  He was awakened and taken to the recovery room in stable condition.    Subjective:   Discharge Exam: Vitals:   12/12/17 0234 12/12/17 0535  BP: 130/83 117/69  Pulse: 100 71  Resp: 18 20  Temp: 99 F (37.2 C) 98.8 F (37.1 C)  SpO2: 93% 94%   Vitals:   12/11/17 1725 12/11/17 2145 12/12/17 0234 12/12/17 0535  BP: (!) 141/76 134/83 130/83 117/69  Pulse: 88 80 100 71  Resp: (!) 21 20 18 20   Temp: 98.9 F (37.2 C) 98.8 F (37.1 C) 99  F (37.2 C) 98.8 F (37.1 C)  TempSrc:  Oral Oral Oral  SpO2: 95% 93% 93% 94%  Weight:      Height:       General exam: Appears calm and comfortable  Respiratory system: Clear to auscultation over anterior lung fields. Respiratory effort normal. Cardiovascular system: Regular rate and rhythm, no murmurs Gastrointestinal system: Soft, nondistended, plus bowel sounds, no organomegaly Central nervous system: Alert and oriented.  Right upper extremity flexed and contractured, bilateral lower extremities extended with bilateral plantar flexion of feet, atrophy and shiny bilaterally, no edema, diminished pulses in lower extremities Extremities: No lower extremity edema, shiny lower extremities, bilateral contractures Skin: No rashes on visible skin, suprapubic site is clean dry intact Psychiatry: Judgement and insight appear normal. Mood & affect appropriate.      The results of significant diagnostics from this hospitalization (including imaging, microbiology, ancillary and laboratory) are listed below for reference.     Microbiology: Recent Results (from the past 240 hour(s))  Blood Culture (routine x 2)     Status: None (Preliminary result)   Collection Time: 12/09/17  7:35 PM  Result Value Ref Range Status   Specimen Description   Final    BLOOD LEFT WRIST Performed at [facility], [address], [place], [place] [address]    Special Requests   Final    BOTTLES DRAWN AEROBIC AND ANAEROBIC Blood Culture adequate volume Performed at [facility], [address], [place], [place] [address]    Culture   Final    NO GROWTH 2 DAYS Performed at [facility], [address], [place], [place] [address]    Report Status PENDING  Incomplete  Blood Culture (routine x 2)     Status: Abnormal   Collection Time: 12/09/17  7:35 PM  Result Value Ref Range Status   Specimen Description   Final    BLOOD RIGHT HAND Performed at [facility], [address] [place], [place] [address]    Special Requests   Final    BOTTLES DRAWN AEROBIC AND ANAEROBIC Blood Culture results may not be optimal due to an excessive volume of blood received in culture bottles Performed at [facility], [address] [place], [place] [address]  Culture  Setup Time   Final    GRAM NEGATIVE RODS AEROBIC BOTTLE ONLY CRITICAL RESULT CALLED TO, READ BACK BY AND VERIFIED WITH: Hurshel Party PharmD 15:40 12/10/17 ([email or web address]) Performed at [facility], [address] [place], [place] [address]    Culture CITROBACTER KOSERI (A)  Final   Report Status 12/12/2017 FINAL  Final   Organism ID, Bacteria CITROBACTER KOSERI  Final      Susceptibility   Citrobacter koseri - MIC*    CEFAZOLIN <=4 SENSITIVE Sensitive     CEFEPIME <=1 SENSITIVE Sensitive     CEFTAZIDIME <=1 SENSITIVE Sensitive     CEFTRIAXONE <=1 SENSITIVE Sensitive     CIPROFLOXACIN <=0.25 SENSITIVE Sensitive     GENTAMICIN <=1 SENSITIVE Sensitive     IMIPENEM <=0.25 SENSITIVE Sensitive     TRIMETH/SULFA <=20 SENSITIVE Sensitive     PIP/TAZO <=4 SENSITIVE Sensitive     * CITROBACTER KOSERI  Blood Culture ID Panel (Reflexed)     Status: Abnormal   Collection Time: 12/09/17  7:35 PM  Result Value Ref Range Status   Enterococcus species NOT DETECTED NOT DETECTED Final   Listeria monocytogenes NOT DETECTED NOT DETECTED Final   Staphylococcus species NOT DETECTED NOT DETECTED Final   Staphylococcus aureus NOT DETECTED NOT DETECTED Final   Streptococcus species NOT DETECTED NOT DETECTED Final   Streptococcus agalactiae NOT DETECTED NOT DETECTED Final   Streptococcus pneumoniae NOT DETECTED NOT DETECTED Final   Streptococcus pyogenes NOT DETECTED NOT DETECTED Final   Acinetobacter baumannii NOT DETECTED NOT DETECTED Final   Enterobacteriaceae species DETECTED (A) NOT DETECTED Final    Comment: Enterobacteriaceae represent a large family of gram negative  bacteria, not a single organism. Refer to culture for further identification. CRITICAL RESULT CALLED TO, READ BACK BY AND VERIFIED WITH: Hurshel Party PharmD 15:40 12/10/17 ([email or web address])    Enterobacter cloacae complex NOT DETECTED NOT DETECTED Final   Escherichia coli NOT DETECTED NOT DETECTED Final   Klebsiella oxytoca NOT DETECTED NOT DETECTED Final   Klebsiella pneumoniae NOT DETECTED NOT DETECTED Final   Proteus species NOT DETECTED NOT DETECTED Final   Serratia marcescens NOT DETECTED NOT DETECTED Final   Carbapenem resistance NOT DETECTED NOT DETECTED Final   Haemophilus influenzae NOT DETECTED NOT DETECTED Final   Neisseria meningitidis NOT DETECTED NOT DETECTED Final   Pseudomonas aeruginosa NOT DETECTED NOT DETECTED Final   Candida albicans NOT DETECTED NOT DETECTED Final   Candida glabrata NOT DETECTED NOT DETECTED Final   Candida krusei NOT DETECTED NOT DETECTED Final   Candida parapsilosis NOT DETECTED NOT DETECTED Final   Candida tropicalis NOT DETECTED NOT DETECTED Final    Comment: Performed at [facility], [address], [place], [place] [address]  Culture, Urine     Status: Abnormal (Preliminary result)   Collection Time: 12/10/17 10:10 AM  Result Value Ref Range Status   Specimen Description   Final    URINE, SUPRAPUBIC Performed at [facility], [address], [place], [place] [address]    Special Requests   Final    NONE Performed at [facility], [address], [place], [place] [address]    Culture (A)  Final    >=100,000 COLONIES/mL ENTEROCOCCUS FAECALIS SUSCEPTIBILITIES TO FOLLOW CULTURE REINCUBATED FOR BETTER GROWTH Performed at [facility], [address], [place], [place] [address]    Report Status PENDING  Incomplete     Labs: BNP (last 3 results) No results for  input(s): BNP in the last 8760 hours. Basic Metabolic Panel: Recent Labs  Lab 12/09/17 1941 12/10/17 0447  NA 137 140  K 3.3* 3.5  CL 99* 106   CO2 24 25  GLUCOSE 125* 162*  BUN 11 10  CREATININE 0.57* 0.72  CALCIUM 9.6 8.8*   Liver Function Tests: Recent Labs  Lab 12/09/17 1941  AST 23  ALT 31  ALKPHOS 120  BILITOT 1.0  PROT 7.8  ALBUMIN 4.2   No results for input(s): LIPASE, AMYLASE in the last 168 hours. No results for input(s): AMMONIA in the last 168 hours. CBC: Recent Labs  Lab 12/09/17 1941 12/10/17 0447 12/11/17 0435  WBC 25.4* 21.5* 11.5*  NEUTROABS 22.4  --   --   HGB 16.2 13.2 12.8*  HCT 48.0 39.8 40.5  MCV 88.2 88.8 90.8  PLT 243 214 173   Cardiac Enzymes: No results for input(s): CKTOTAL, CKMB, CKMBINDEX, TROPONINI in the last 168 hours. BNP: Invalid input(s): POCBNP CBG: No results for input(s): GLUCAP in the last 168 hours. D-Dimer No results for input(s): DDIMER in the last 72 hours. Hgb A1c No results for input(s): HGBA1C in the last 72 hours. Lipid Profile No results for input(s): CHOL, HDL, LDLCALC, TRIG, CHOLHDL, LDLDIRECT in the last 72 hours. Thyroid function studies No results for input(s): TSH, T4TOTAL, T3FREE, THYROIDAB in the last 72 hours.  Invalid input(s): FREET3 Anemia work up No results for input(s): VITAMINB12, FOLATE, FERRITIN, TIBC, IRON, RETICCTPCT in the last 72 hours. Urinalysis    Component Value Date/Time   COLORURINE YELLOW 12/09/2017 2038   APPEARANCEUR HAZY (A) 12/09/2017 2038   LABSPEC 1.011 12/09/2017 2038   PHURINE 6.0 12/09/2017 2038   GLUCOSEU NEGATIVE 12/09/2017 2038   HGBUR LARGE (A) 12/09/2017 2038   BILIRUBINUR NEGATIVE 12/09/2017 2038   KETONESUR 5 (A) 12/09/2017 2038   PROTEINUR NEGATIVE 12/09/2017 2038   UROBILINOGEN 0.2 06/02/2015 0459   NITRITE NEGATIVE 12/09/2017 2038   LEUKOCYTESUR LARGE (A) 12/09/2017 2038   Sepsis Labs Invalid input(s): PROCALCITONIN,  WBC,  LACTICIDVEN Microbiology Recent Results (from the past 240 hour(s))  Blood Culture (routine x 2)     Status: None (Preliminary result)   Collection Time: 12/09/17  7:35 PM   Result Value Ref Range Status   Specimen Description   Final    BLOOD LEFT WRIST Performed at [facility], [address], [place], [place] [address]    Special Requests   Final    BOTTLES DRAWN AEROBIC AND ANAEROBIC Blood Culture adequate volume Performed at [facility], [address] [place], [place] [address]    Culture   Final    NO GROWTH 2 DAYS Performed at [facility], [address], [place], [place] [address]    Report Status PENDING  Incomplete  Blood Culture (routine x 2)     Status: Abnormal   Collection Time: 12/09/17  7:35 PM  Result Value Ref Range Status   Specimen Description   Final    BLOOD RIGHT HAND Performed at [facility], [address], [place], [place] [address]    Special Requests   Final    BOTTLES DRAWN AEROBIC AND ANAEROBIC Blood Culture results may not be optimal due to an excessive volume of blood received in culture bottles Performed at [facility], [address], [place], [place] [address]    Culture  Setup Time   Final    GRAM NEGATIVE RODS AEROBIC BOTTLE  ONLY CRITICAL RESULT CALLED TO, READ BACK BY AND VERIFIED WITH: Hurshel Party PharmD 15:40 12/10/17 ([email or web address]) Performed at [facility], [address] [place], [place] [address]    Culture CITROBACTER KOSERI (A)  Final   Report Status 12/12/2017 FINAL  Final   Organism ID, Bacteria CITROBACTER KOSERI  Final      Susceptibility   Citrobacter koseri - MIC*    CEFAZOLIN <=4 SENSITIVE Sensitive     CEFEPIME <=1 SENSITIVE Sensitive     CEFTAZIDIME <=1 SENSITIVE Sensitive     CEFTRIAXONE <=1 SENSITIVE Sensitive     CIPROFLOXACIN <=0.25 SENSITIVE Sensitive     GENTAMICIN <=1 SENSITIVE Sensitive     IMIPENEM <=0.25 SENSITIVE Sensitive     TRIMETH/SULFA <=20 SENSITIVE Sensitive     PIP/TAZO <=4 SENSITIVE Sensitive     * CITROBACTER KOSERI  Blood Culture ID Panel (Reflexed)     Status: Abnormal    Collection Time: 12/09/17  7:35 PM  Result Value Ref Range Status   Enterococcus species NOT DETECTED NOT DETECTED Final   Listeria monocytogenes NOT DETECTED NOT DETECTED Final   Staphylococcus species NOT DETECTED NOT DETECTED Final   Staphylococcus aureus NOT DETECTED NOT DETECTED Final   Streptococcus species NOT DETECTED NOT DETECTED Final   Streptococcus agalactiae NOT DETECTED NOT DETECTED Final   Streptococcus pneumoniae NOT DETECTED NOT DETECTED Final   Streptococcus pyogenes NOT DETECTED NOT DETECTED Final   Acinetobacter baumannii NOT DETECTED NOT DETECTED Final   Enterobacteriaceae species DETECTED (A) NOT DETECTED Final    Comment: Enterobacteriaceae represent a large family of gram negative bacteria, not a single organism. Refer to culture for further identification. CRITICAL RESULT CALLED TO, READ BACK BY AND VERIFIED WITH: Hurshel Party PharmD 15:40 12/10/17 ([email or web address])    Enterobacter cloacae complex NOT DETECTED NOT DETECTED Final   Escherichia coli NOT DETECTED NOT DETECTED Final   Klebsiella oxytoca NOT DETECTED NOT DETECTED Final   Klebsiella pneumoniae NOT DETECTED NOT DETECTED Final   Proteus species NOT DETECTED NOT DETECTED Final   Serratia marcescens NOT DETECTED NOT DETECTED Final   Carbapenem resistance NOT DETECTED NOT DETECTED Final   Haemophilus influenzae NOT DETECTED NOT DETECTED Final   Neisseria meningitidis NOT DETECTED NOT DETECTED Final   Pseudomonas aeruginosa NOT DETECTED NOT DETECTED Final   Candida albicans NOT DETECTED NOT DETECTED Final   Candida glabrata NOT DETECTED NOT DETECTED Final   Candida krusei NOT DETECTED NOT DETECTED Final   Candida parapsilosis NOT DETECTED NOT DETECTED Final   Candida tropicalis NOT DETECTED NOT DETECTED Final    Comment: Performed at [facility], [address] [place], [place] [address]  Culture, Urine     Status: Abnormal (Preliminary result)   Collection Time: 12/10/17 10:10 AM  Result Value Ref Range  Status   Specimen Description   Final    URINE, SUPRAPUBIC Performed at [facility], [address], [place], [place] [address]    Special Requests   Final    NONE Performed at [facility], [address], [place], [place] [address]    Culture (A)  Final    >=100,000 COLONIES/mL ENTEROCOCCUS FAECALIS SUSCEPTIBILITIES TO FOLLOW CULTURE REINCUBATED FOR BETTER GROWTH Performed at [facility], [address] [place], [place] [address]    Report Status PENDING  Incomplete     Time coordinating discharge: Over 30 minutes  SIGNED:   Delaine Lame, MD  [facility] 12/12/2017, 12:53 PM  If  7PM-7AM, please contact night-coverage [email or web address] Password [email or web address]

## 2017-12-12 NOTE — Progress Notes (Signed)
Urology Inpatient Progress Report  Fever  Procedure(s): CYSTOSCOPY WITH RETROGRADE PYELOGRAM, URETEROSCOPY AND STENT PLACEMENT RIGHT  1 Day Post-Op   Intv/Subj: No acute events overnight. Patient is without complaint. Patient has been afebrile.  Vital signs have been stable.  He has no complaints.  Urine is draining light red as expected the surgery.  Urine/blood culture results have finalized.  Getting ready to go home later today.   Active Problems:   Sepsis (HCC)  Current Facility-Administered Medications  Medication Dose Route Frequency Provider Last Rate Last Dose  . acetaminophen (TYLENOL) tablet 650 mg  650 mg Oral Q6H PRN [name], MD       Or  . acetaminophen (TYLENOL) suppository 650 mg  650 mg Rectal Q6H PRN [name], MD      . acetic acid 0.25 % irrigation   Irrigation PRN [name], MD      . baclofen (LIORESAL) tablet 40 mg  40 mg Oral TID [name], MD   40 mg at 12/12/17 1516  . diazepam (VALIUM) tablet 5 mg  5 mg Oral TID [name], MD   5 mg at 12/12/17 1516  . docusate sodium (COLACE) capsule 250 mg  250 mg Oral Daily [name], MD   250 mg at 12/12/17 5409  . enoxaparin (LOVENOX) injection 40 mg  40 mg Subcutaneous QHS [name], MD   40 mg at 12/11/17 2227  . hydrochlorothiazide 10 mg/mL oral suspension 6.25 mg  6.25 mg Oral Daily [name], MD   6.25 mg at 12/12/17 0856   And  . lisinopril (PRINIVIL,ZESTRIL) tablet 5 mg  5 mg Oral Daily [name], MD   5 mg at 12/12/17 0853  . HYDROcodone-acetaminophen (NORCO/VICODIN) 5-325 MG per tablet 1 tablet  1 tablet Oral Q6H PRN [name], MD      . levothyroxine (SYNTHROID, LEVOTHROID) tablet 125 mcg  125 mcg Oral QAC breakfast [name], MD   125 mcg at 12/12/17 [phone]  . meropenem (MERREM) 1 g in sodium chloride 0.9 % 100 mL IVPB  1 g Intravenous Q8H [name], MD   Stopped at 12/12/17 [phone]  . mirabegron ER (MYRBETRIQ)  tablet 50 mg  50 mg Oral Daily [name], MD   50 mg at 12/12/17 0852  . ondansetron (ZOFRAN) tablet 4 mg  4 mg Oral Q6H PRN [name], MD       Or  . ondansetron [facility]) injection 4 mg  4 mg Intravenous Q6H PRN [name], MD      . oxybutynin (DITROPAN-XL) 24 hr tablet 15 mg  15 mg Oral Daily [name], MD   15 mg at 12/12/17 0853  . pantoprazole (PROTONIX) EC tablet 40 mg  40 mg Oral Daily [name], MD   40 mg at 12/12/17 0853  . perphenazine (TRILAFON) tablet 2 mg  2 mg Oral BID [name], MD   2 mg at 12/12/17 2956  . polyvinyl alcohol (LIQUIFILM TEARS) 1.4 % ophthalmic solution 1 drop  1 drop Both Eyes PRN Purohit, Salli Quarry, MD   1 drop at 12/12/17 0609  . tiZANidine (ZANAFLEX) tablet 8 mg  8 mg Oral TID [name], MD   8 mg at 12/12/17 1515  . traZODone (DESYREL) tablet 300 mg  300 mg Oral QHS [name], MD   300 mg at 12/11/17 2215  . venlafaxine XR (EFFEXOR-XR) 24 hr capsule 75 mg  75 mg Oral Q breakfast [name], MD   75 mg at  12/12/17 0852  . zolpidem (AMBIEN) tablet 5 mg  5 mg Oral QHS PRN,MR X 1 [name], MD         Objective: Vital: Vitals:   12/11/17 2145 12/12/17 0234 12/12/17 0535 12/12/17 1302  BP: 134/83 130/83 117/69 132/90  Pulse: 80 100 71 76  Resp: 20 18 20    Temp: 98.8 F (37.1 C) 99 F (37.2 C) 98.8 F (37.1 C) 98.7 F (37.1 C)  TempSrc: Oral Oral Oral Oral  SpO2: 93% 93% 94% 91%  Weight:      Height:       I/Os: I/O last 3 completed shifts: In: 2405 [P.O.:280; I.V.:1825; IV Piggyback:300] Out: 3595 [Urine:3595]  Physical Exam:  General: Patient is in no apparent distress Lungs: Normal respiratory effort, chest expands symmetrically. GI: the abdomen is soft and nontender without mass. JP drain with serosanguinous drainage Foley: Light red urine Ext: Contracted extremities  Lab Results: Recent Labs    12/09/17 1941 12/10/17 0447 12/11/17 0435  WBC 25.4* 21.5*  11.5*  HGB 16.2 13.2 12.8*  HCT 48.0 39.8 40.5   Recent Labs    12/09/17 1941 12/10/17 0447  NA 137 140  K 3.3* 3.5  CL 99* 106  CO2 24 25  GLUCOSE 125* 162*  BUN 11 10  CREATININE 0.57* 0.72  CALCIUM 9.6 8.8*   No results for input(s): LABPT, INR in the last 72 hours. No results for input(s): LABURIN in the last 72 hours. Results for orders placed or performed during the hospital encounter of 12/09/17  Blood Culture (routine x 2)     Status: None (Preliminary result)   Collection Time: 12/09/17  7:35 PM  Result Value Ref Range Status   Specimen Description   Final    BLOOD LEFT WRIST Performed at [facility], [address], [place], [place] [address]    Special Requests   Final    BOTTLES DRAWN AEROBIC AND ANAEROBIC Blood Culture adequate volume Performed at [facility], [address] [place], [place] [address]    Culture   Final    NO GROWTH 2 DAYS Performed at [facility], [address], [place], [place] [address]    Report Status PENDING  Incomplete  Blood Culture (routine x 2)     Status: Abnormal   Collection Time: 12/09/17  7:35 PM  Result Value Ref Range Status   Specimen Description   Final    BLOOD RIGHT HAND Performed at [facility], [address], [place], [place] [address]    Special Requests   Final    BOTTLES DRAWN AEROBIC AND ANAEROBIC Blood Culture results may not be optimal due to an excessive volume of blood received in culture bottles Performed at [facility], [address] [place], [place] [address]    Culture  Setup Time   Final    GRAM NEGATIVE RODS AEROBIC BOTTLE ONLY CRITICAL RESULT CALLED TO, READ BACK BY AND VERIFIED WITH: Hurshel Party PharmD 15:40 12/10/17 ([email or web address]) Performed at [facility], [address] [place], [place] [address]    Culture CITROBACTER KOSERI (A)  Final   Report Status 12/12/2017 FINAL  Final   Organism ID, Bacteria  CITROBACTER KOSERI  Final      Susceptibility   Citrobacter koseri - MIC*    CEFAZOLIN <=4 SENSITIVE Sensitive     CEFEPIME <=1 SENSITIVE Sensitive     CEFTAZIDIME <=1 SENSITIVE Sensitive  CEFTRIAXONE <=1 SENSITIVE Sensitive     CIPROFLOXACIN <=0.25 SENSITIVE Sensitive     GENTAMICIN <=1 SENSITIVE Sensitive     IMIPENEM <=0.25 SENSITIVE Sensitive     TRIMETH/SULFA <=20 SENSITIVE Sensitive     PIP/TAZO <=4 SENSITIVE Sensitive     * CITROBACTER KOSERI  Blood Culture ID Panel (Reflexed)     Status: Abnormal   Collection Time: 12/09/17  7:35 PM  Result Value Ref Range Status   Enterococcus species NOT DETECTED NOT DETECTED Final   Listeria monocytogenes NOT DETECTED NOT DETECTED Final   Staphylococcus species NOT DETECTED NOT DETECTED Final   Staphylococcus aureus NOT DETECTED NOT DETECTED Final   Streptococcus species NOT DETECTED NOT DETECTED Final   Streptococcus agalactiae NOT DETECTED NOT DETECTED Final   Streptococcus pneumoniae NOT DETECTED NOT DETECTED Final   Streptococcus pyogenes NOT DETECTED NOT DETECTED Final   Acinetobacter baumannii NOT DETECTED NOT DETECTED Final   Enterobacteriaceae species DETECTED (A) NOT DETECTED Final    Comment: Enterobacteriaceae represent a large family of gram negative bacteria, not a single organism. Refer to culture for further identification. CRITICAL RESULT CALLED TO, READ BACK BY AND VERIFIED WITH: Hurshel Party PharmD 15:40 12/10/17 ([email or web address])    Enterobacter cloacae complex NOT DETECTED NOT DETECTED Final   Escherichia coli NOT DETECTED NOT DETECTED Final   Klebsiella oxytoca NOT DETECTED NOT DETECTED Final   Klebsiella pneumoniae NOT DETECTED NOT DETECTED Final   Proteus species NOT DETECTED NOT DETECTED Final   Serratia marcescens NOT DETECTED NOT DETECTED Final   Carbapenem resistance NOT DETECTED NOT DETECTED Final   Haemophilus influenzae NOT DETECTED NOT DETECTED Final   Neisseria meningitidis NOT DETECTED NOT DETECTED Final    Pseudomonas aeruginosa NOT DETECTED NOT DETECTED Final   Candida albicans NOT DETECTED NOT DETECTED Final   Candida glabrata NOT DETECTED NOT DETECTED Final   Candida krusei NOT DETECTED NOT DETECTED Final   Candida parapsilosis NOT DETECTED NOT DETECTED Final   Candida tropicalis NOT DETECTED NOT DETECTED Final    Comment: Performed at [facility], [address] [place], [place] [address]  Culture, Urine     Status: Abnormal (Preliminary result)   Collection Time: 12/10/17 10:10 AM  Result Value Ref Range Status   Specimen Description   Final    URINE, SUPRAPUBIC Performed at [facility], [address] [place], [place] [address]    Special Requests   Final    NONE Performed at [facility], [address] [place], [place] [address]    Culture (A)  Final    >=100,000 COLONIES/mL ENTEROCOCCUS FAECALIS SUSCEPTIBILITIES TO FOLLOW CULTURE REINCUBATED FOR BETTER GROWTH Performed at [facility], [address], [place], [place] [address]    Report Status PENDING  Incomplete    Studies/Results: Ct Abdomen Pelvis Wo Contrast  Result Date: 12/10/2017 CLINICAL DATA:  Nausea vomiting and fever since this morning. History of kidney stones. EXAM: CT ABDOMEN AND PELVIS WITHOUT CONTRAST TECHNIQUE: Multidetector CT imaging of the abdomen and pelvis was performed following the standard protocol without IV contrast. COMPARISON:  10/11/2015 FINDINGS: Lower chest: There is dependent opacity in the lower lobes consistent with atelectasis. No convincing pneumonia or pulmonary edema. Heart is normal in size. Hepatobiliary: Liver demonstrates diffuse decreased attenuation consistent with fatty infiltration. No liver mass or focal lesion. Gallbladder is unremarkable. No bile duct dilation. Pancreas: Unremarkable. No pancreatic ductal dilatation or surrounding inflammatory changes. Spleen: Normal in size without focal abnormality. Adrenals/Urinary Tract:  No adrenal  masses. Mild prominence of the right intrarenal collecting system. There are intrarenal stones on the right, largest a right renal pelvis stone measuring 1.1 cm. Mild right perinephric stranding. No left intrarenal stones. No left hydronephrosis. No renal masses on either side. Both ureters are normal in course and in caliber. Stranding extends along the proximal to mid right ureter along the posterior, inferior pararenal fascia. No ureteral stones. Bladder is decompressed by a suprapubic Foley catheter. No bladder mass or stone. Stomach/Bowel: Moderate increased stool distends the rectum. There is increased stool, mild-to-moderate, throughout the colon. No rectal or colonic wall thickening. No inflammation. Stomach and small bowel are unremarkable. Portions of a normal appendix are visualized. Vascular/Lymphatic: Minor aortic atherosclerosis.  No adenopathy. Reproductive: Unremarkable. Other: No abdominal wall hernia.  No ascites. Musculoskeletal: No fracture or acute finding. No osteoblastic or osteolytic lesions. IMPRESSION: 1. Mild prominence of the right intrarenal collecting system. There are right intrarenal stones, largest a renal pelvis stone measuring 11 mm. This stone may be causing intermittent UPJ obstruction. This is supported by mild right perinephric stranding. 2. No ureteral stones.  No left intrarenal stones. 3. No other evidence of an acute abnormality. 4. Hepatic steatosis. 5. Increased stool in the colon. Increased stool, moderate, distends the rectum. No bowel inflammation or obstruction. 6. Mild aortic atherosclerosis. Electronically Signed   By: Amie Portland M.D.   On: 12/10/2017 15:43   Dg C-arm 1-60 Min-no Report  Result Date: 12/11/2017 Fluoroscopy was utilized by the requesting physician.  No radiographic interpretation.    Assessment: Procedure(s): Right ureteropelvic junction calculus status post ureteral stent placement  CYSTOSCOPY WITH RETROGRADE PYELOGRAM, URETEROSCOPY  AND STENT PLACEMENT RIGHT, 1 Day Post-Op  doing well.  Plan: Agree with discharging with antibiotics.  We will set him up to undergo a cystoscopy with right ureteroscopy, laser lithotripsy, ureteral stent exchange.   Modena Slater, MD Urology 12/12/2017, 3:18 PM

## 2017-12-13 LAB — URINE CULTURE: Culture: >=100000 — AB

## 2017-12-15 LAB — CULTURE, BLOOD (ROUTINE X 2)
Culture: NO GROWTH
Special Requests: ADEQUATE

## 2017-12-15 NOTE — Telephone Encounter (Signed)
I called the family.  The patient was in the hospital on 09 December 2017.  The patient was taken off of Keppra, they indicated that he never really had a seizure previously it was a medication reaction.  That is fine to stay off the medication.  He has a suprapubic catheter in place, he was taken off of the oxybutynin, Myrbetriq, and Vesicare.  They need to check with their urology doctor, but the patient does have painful bladder spasms, likely will need the oxybutynin, he may be able to come off the other 2 medications.

## 2017-12-15 NOTE — Telephone Encounter (Signed)
Pt sister has called stating pt sent home on Friday and they have made major changes to pt's medication that concern pt sister.  She would like a call back from [name] to know what he wants  Pt to stay on and what to come off of

## 2017-12-16 ENCOUNTER — Ambulatory Visit: Payer: Medicare Other | Admitting: Neurology

## 2017-12-17 ENCOUNTER — Other Ambulatory Visit: Payer: Self-pay | Admitting: Urology

## 2017-12-20 ENCOUNTER — Other Ambulatory Visit: Payer: Self-pay | Admitting: Neurology

## 2017-12-22 ENCOUNTER — Other Ambulatory Visit: Payer: Self-pay

## 2017-12-22 ENCOUNTER — Encounter (HOSPITAL_COMMUNITY): Payer: Self-pay | Admitting: *Deleted

## 2017-12-29 ENCOUNTER — Encounter (HOSPITAL_COMMUNITY): Payer: Self-pay | Admitting: Anesthesiology

## 2017-12-29 ENCOUNTER — Ambulatory Visit (HOSPITAL_COMMUNITY): Payer: Medicare Other | Admitting: Anesthesiology

## 2017-12-29 ENCOUNTER — Ambulatory Visit (HOSPITAL_COMMUNITY): Payer: Medicare Other

## 2017-12-29 ENCOUNTER — Encounter (HOSPITAL_COMMUNITY)
Admission: AD | Disposition: A | Discharge status: Home / Self Care | Payer: Self-pay | Source: Home / Self Care | Attending: Urology

## 2017-12-29 ENCOUNTER — Other Ambulatory Visit: Payer: Self-pay

## 2017-12-29 ENCOUNTER — Inpatient Hospital Stay (HOSPITAL_COMMUNITY)
Admission: AD | Admit: 2017-12-29 | Discharge: 2018-01-01 | DRG: 659 | Disposition: A | Discharge status: Home / Self Care | Payer: Medicare Other | Attending: Urology | Admitting: Urology

## 2017-12-29 DIAGNOSIS — Y733 Surgical instruments, materials and gastroenterology and urology devices (including sutures) associated with adverse incidents: Secondary | ICD-10-CM | POA: Diagnosis not present

## 2017-12-29 DIAGNOSIS — K592 Neurogenic bowel, not elsewhere classified: Secondary | ICD-10-CM | POA: Diagnosis present

## 2017-12-29 DIAGNOSIS — J9601 Acute respiratory failure with hypoxia: Secondary | ICD-10-CM | POA: Diagnosis not present

## 2017-12-29 DIAGNOSIS — N319 Neuromuscular dysfunction of bladder, unspecified: Secondary | ICD-10-CM | POA: Diagnosis present

## 2017-12-29 DIAGNOSIS — J690 Pneumonitis due to inhalation of food and vomit: Secondary | ICD-10-CM | POA: Diagnosis not present

## 2017-12-29 DIAGNOSIS — R532 Functional quadriplegia: Secondary | ICD-10-CM | POA: Diagnosis present

## 2017-12-29 DIAGNOSIS — N3090 Cystitis, unspecified without hematuria: Secondary | ICD-10-CM | POA: Diagnosis present

## 2017-12-29 DIAGNOSIS — Z87891 Personal history of nicotine dependence: Secondary | ICD-10-CM

## 2017-12-29 DIAGNOSIS — R0682 Tachypnea, not elsewhere classified: Secondary | ICD-10-CM

## 2017-12-29 DIAGNOSIS — J95821 Acute postprocedural respiratory failure: Secondary | ICD-10-CM | POA: Diagnosis not present

## 2017-12-29 DIAGNOSIS — T8144XA Sepsis following a procedure, initial encounter: Secondary | ICD-10-CM | POA: Diagnosis not present

## 2017-12-29 DIAGNOSIS — A419 Sepsis, unspecified organism: Secondary | ICD-10-CM | POA: Diagnosis not present

## 2017-12-29 DIAGNOSIS — K219 Gastro-esophageal reflux disease without esophagitis: Secondary | ICD-10-CM | POA: Diagnosis present

## 2017-12-29 DIAGNOSIS — Z79899 Other long term (current) drug therapy: Secondary | ICD-10-CM

## 2017-12-29 DIAGNOSIS — Y838 Other surgical procedures as the cause of abnormal reaction of the patient, or of later complication, without mention of misadventure at the time of the procedure: Secondary | ICD-10-CM | POA: Diagnosis not present

## 2017-12-29 DIAGNOSIS — N201 Calculus of ureter: Secondary | ICD-10-CM | POA: Diagnosis present

## 2017-12-29 DIAGNOSIS — I10 Essential (primary) hypertension: Secondary | ICD-10-CM | POA: Diagnosis present

## 2017-12-29 DIAGNOSIS — G8929 Other chronic pain: Secondary | ICD-10-CM | POA: Diagnosis present

## 2017-12-29 DIAGNOSIS — G35 Multiple sclerosis: Secondary | ICD-10-CM | POA: Diagnosis present

## 2017-12-29 DIAGNOSIS — R5382 Chronic fatigue, unspecified: Secondary | ICD-10-CM | POA: Diagnosis present

## 2017-12-29 DIAGNOSIS — J9600 Acute respiratory failure, unspecified whether with hypoxia or hypercapnia: Secondary | ICD-10-CM

## 2017-12-29 DIAGNOSIS — E785 Hyperlipidemia, unspecified: Secondary | ICD-10-CM | POA: Diagnosis present

## 2017-12-29 DIAGNOSIS — Z936 Other artificial openings of urinary tract status: Secondary | ICD-10-CM

## 2017-12-29 DIAGNOSIS — Z79890 Hormone replacement therapy (postmenopausal): Secondary | ICD-10-CM

## 2017-12-29 DIAGNOSIS — E559 Vitamin D deficiency, unspecified: Secondary | ICD-10-CM | POA: Diagnosis present

## 2017-12-29 DIAGNOSIS — N202 Calculus of kidney with calculus of ureter: Principal | ICD-10-CM | POA: Diagnosis present

## 2017-12-29 DIAGNOSIS — E039 Hypothyroidism, unspecified: Secondary | ICD-10-CM | POA: Diagnosis present

## 2017-12-29 DIAGNOSIS — Z87442 Personal history of urinary calculi: Secondary | ICD-10-CM

## 2017-12-29 DIAGNOSIS — Z880 Allergy status to penicillin: Secondary | ICD-10-CM

## 2017-12-29 HISTORY — PX: CYSTOSCOPY WITH RETROGRADE PYELOGRAM, URETEROSCOPY AND STENT PLACEMENT: SHX5789

## 2017-12-29 HISTORY — DX: Depression, unspecified: F32.A

## 2017-12-29 HISTORY — PX: CYSTOSCOPY WITH BIOPSY: SHX5122

## 2017-12-29 HISTORY — DX: Major depressive disorder, single episode, unspecified: F32.9

## 2017-12-29 HISTORY — DX: Anxiety disorder, unspecified: F41.9

## 2017-12-29 HISTORY — DX: Personal history of urinary calculi: Z87.442

## 2017-12-29 LAB — BASIC METABOLIC PANEL
Anion gap: 16 — ABNORMAL HIGH (ref 5–15)
BUN: 13 mg/dL (ref 6–20)
CO2: 19 mmol/L — ABNORMAL LOW (ref 22–32)
Calcium: 9.3 mg/dL (ref 8.9–10.3)
Chloride: 101 mmol/L (ref 101–111)
Creatinine, Ser: 0.87 mg/dL (ref 0.61–1.24)
GFR calc Af Amer: >60 mL/min (ref >60)
GFR calc non Af Amer: >60 mL/min (ref >60)
Glucose, Bld: 154 mg/dL — ABNORMAL HIGH (ref 65–99)
Potassium: 4.4 mmol/L (ref 3.5–5.1)
Sodium: 136 mmol/L (ref 135–145)

## 2017-12-29 LAB — BLOOD GAS, ARTERIAL
Acid-base deficit: 0.7 mmol/L (ref 0.0–2.0)
Bicarbonate: 23.5 mmol/L (ref 20.0–28.0)
Delivery systems: POSITIVE
Drawn by: 257701
FIO2: 100
O2 Saturation: 99.7 %
PEEP: 6 cmH2O
Patient temperature: 103
Pressure control: 12 cmH2O
RATE: 14 resp/min
pCO2 arterial: 44.3 mmHg (ref 32.0–48.0)
pH, Arterial: 7.358 (ref 7.350–7.450)
pO2, Arterial: 414 mmHg — ABNORMAL HIGH (ref 83.0–108.0)

## 2017-12-29 LAB — CBC WITH DIFFERENTIAL/PLATELET
Basophils Absolute: 0 10*3/uL (ref 0.0–0.1)
Basophils Relative: 0 %
Eosinophils Absolute: 0 10*3/uL (ref 0.0–0.7)
Eosinophils Relative: 0 %
HCT: 50.7 % (ref 39.0–52.0)
Hemoglobin: 16.6 g/dL (ref 13.0–17.0)
Lymphocytes Relative: 4 %
Lymphs Abs: 0.4 10*3/uL — ABNORMAL LOW (ref 0.7–4.0)
MCH: 29.6 pg (ref 26.0–34.0)
MCHC: 32.7 g/dL (ref 30.0–36.0)
MCV: 90.4 fL (ref 78.0–100.0)
Monocytes Absolute: 0.1 10*3/uL (ref 0.1–1.0)
Monocytes Relative: 1 %
Neutro Abs: 11.2 10*3/uL — ABNORMAL HIGH (ref 1.7–7.7)
Neutrophils Relative %: 95 %
Platelets: 329 10*3/uL (ref 150–400)
RBC: 5.61 MIL/uL (ref 4.22–5.81)
RDW: 13 % (ref 11.5–15.5)
WBC: 11.8 10*3/uL — ABNORMAL HIGH (ref 4.0–10.5)

## 2017-12-29 LAB — MRSA PCR SCREENING: MRSA by PCR: NEGATIVE

## 2017-12-29 SURGERY — CYSTOURETEROSCOPY, WITH RETROGRADE PYELOGRAM AND STENT INSERTION
Anesthesia: General | Laterality: Right

## 2017-12-29 MED ORDER — FENTANYL CITRATE (PF) 100 MCG/2ML IJ SOLN
INTRAMUSCULAR | Status: DC | PRN
  Administered 2017-12-29 (×3): 50 ug via INTRAVENOUS
  Administered 2017-12-29 (×3): 25 ug via INTRAVENOUS
  Administered 2017-12-29: 50 ug via INTRAVENOUS
  Administered 2017-12-29: 25 ug via INTRAVENOUS

## 2017-12-29 MED ORDER — LACTATED RINGERS IV SOLN
INTRAVENOUS | Status: DC
Start: 2017-12-29 — End: 2017-12-29
  Administered 2017-12-29 (×2): via INTRAVENOUS

## 2017-12-29 MED ORDER — ACETAMINOPHEN 325 MG PO TABS: 650.0000 mg | ORAL_TABLET | ORAL | Status: DC | PRN

## 2017-12-29 MED ORDER — OXYCODONE HCL 5 MG PO TABS: 5.0000 mg | ORAL_TABLET | ORAL | Status: DC | PRN

## 2017-12-29 MED ORDER — LACTATED RINGERS IV SOLN
INTRAVENOUS | Status: DC
  Administered 2017-12-29: 16:00:00 via INTRAVENOUS

## 2017-12-29 MED ORDER — DOCUSATE SODIUM 50 MG PO CAPS
250.0000 mg | ORAL_CAPSULE | Freq: Every day | ORAL | Status: DC
  Administered 2017-12-30 – 2017-12-31 (×2): 250 mg via ORAL
  Filled 2017-12-29 (×4): qty 1

## 2017-12-29 MED ORDER — PHENYLEPHRINE HCL 10 MG/ML IJ SOLN
INTRAMUSCULAR | Status: DC | PRN
  Administered 2017-12-29: 40 ug via INTRAVENOUS
  Administered 2017-12-29: 80 ug via INTRAVENOUS

## 2017-12-29 MED ORDER — TIZANIDINE HCL 4 MG PO TABS
8.0000 mg | ORAL_TABLET | Freq: Three times a day (TID) | ORAL | Status: DC
  Administered 2017-12-30 – 2018-01-01 (×8): 8 mg via ORAL
  Filled 2017-12-29 (×8): qty 2

## 2017-12-29 MED ORDER — ESMOLOL HCL-SODIUM CHLORIDE 2000 MG/100ML IV SOLN
25.0000 ug/kg/min | INTRAVENOUS | Status: DC
  Filled 2017-12-29: qty 100

## 2017-12-29 MED ORDER — DIPHENHYDRAMINE HCL 50 MG/ML IJ SOLN: 12.5000 mg | Freq: Four times a day (QID) | INTRAMUSCULAR | Status: DC | PRN

## 2017-12-29 MED ORDER — PANTOPRAZOLE SODIUM 40 MG PO TBEC
40.0000 mg | DELAYED_RELEASE_TABLET | Freq: Every day | ORAL | Status: DC
  Administered 2017-12-30 – 2018-01-01 (×3): 40 mg via ORAL
  Filled 2017-12-29 (×3): qty 1

## 2017-12-29 MED ORDER — ACETAMINOPHEN 650 MG RE SUPP: 650.0000 mg | RECTAL | Status: DC | PRN

## 2017-12-29 MED ORDER — FENTANYL CITRATE (PF) 100 MCG/2ML IJ SOLN: 25.0000 ug | INTRAMUSCULAR | Status: DC | PRN

## 2017-12-29 MED ORDER — LEVOTHYROXINE SODIUM 25 MCG PO TABS
125.0000 ug | ORAL_TABLET | Freq: Every day | ORAL | Status: DC
  Administered 2017-12-30 – 2018-01-01 (×3): 125 ug via ORAL
  Filled 2017-12-29 (×3): qty 1

## 2017-12-29 MED ORDER — VANCOMYCIN HCL 10 G IV SOLR
1500.0000 mg | Freq: Once | INTRAVENOUS | Status: AC
  Administered 2017-12-29: 1500 mg via INTRAVENOUS
  Filled 2017-12-29: qty 1500

## 2017-12-29 MED ORDER — LISINOPRIL-HYDROCHLOROTHIAZIDE 10-12.5 MG PO TABS: 0.5000 | ORAL_TABLET | Freq: Every morning | ORAL | Status: DC

## 2017-12-29 MED ORDER — ONDANSETRON HCL 4 MG/2ML IJ SOLN
INTRAMUSCULAR | Status: DC | PRN
  Administered 2017-12-29: 4 mg via INTRAVENOUS

## 2017-12-29 MED ORDER — PROMETHAZINE HCL 25 MG/ML IJ SOLN: 6.2500 mg | INTRAMUSCULAR | Status: DC | PRN

## 2017-12-29 MED ORDER — LIDOCAINE 2% (20 MG/ML) 5 ML SYRINGE
INTRAMUSCULAR | Status: AC
  Filled 2017-12-29: qty 10

## 2017-12-29 MED ORDER — OXYBUTYNIN CHLORIDE 5 MG PO TABS: 5.0000 mg | ORAL_TABLET | Freq: Three times a day (TID) | ORAL | Status: DC | PRN

## 2017-12-29 MED ORDER — FENTANYL CITRATE (PF) 100 MCG/2ML IJ SOLN
INTRAMUSCULAR | Status: AC
  Filled 2017-12-29: qty 2

## 2017-12-29 MED ORDER — ACETAMINOPHEN 10 MG/ML IV SOLN
INTRAVENOUS | Status: AC
  Administered 2017-12-29: 1000 mg via INTRAVENOUS
  Filled 2017-12-29: qty 100

## 2017-12-29 MED ORDER — LIDOCAINE HCL (CARDIAC) PF 100 MG/5ML IV SOSY
PREFILLED_SYRINGE | INTRAVENOUS | Status: DC | PRN
  Administered 2017-12-29: 80 mg via INTRAVENOUS

## 2017-12-29 MED ORDER — DIAZEPAM 5 MG PO TABS
5.0000 mg | ORAL_TABLET | Freq: Three times a day (TID) | ORAL | Status: DC
Start: 2017-12-29 — End: 2018-01-01
  Administered 2017-12-30 – 2018-01-01 (×8): 5 mg via ORAL
  Filled 2017-12-29 (×8): qty 1

## 2017-12-29 MED ORDER — TRAZODONE HCL 100 MG PO TABS
300.0000 mg | ORAL_TABLET | Freq: Every day | ORAL | Status: DC
  Administered 2017-12-30 – 2017-12-31 (×2): 300 mg via ORAL
  Filled 2017-12-29: qty 6
  Filled 2017-12-29: qty 3

## 2017-12-29 MED ORDER — ACETAMINOPHEN 10 MG/ML IV SOLN
1000.0000 mg | Freq: Once | INTRAVENOUS | Status: AC
  Administered 2017-12-29: 1000 mg via INTRAVENOUS

## 2017-12-29 MED ORDER — LISINOPRIL 2.5 MG PO TABS: 5.0000 mg | ORAL_TABLET | Freq: Every day | ORAL | Status: DC

## 2017-12-29 MED ORDER — ESMOLOL HCL 100 MG/10ML IV SOLN
30.0000 mg | Freq: Once | INTRAVENOUS | Status: AC
  Administered 2017-12-29: 30 mg via INTRAVENOUS
  Filled 2017-12-29: qty 3

## 2017-12-29 MED ORDER — PROPOFOL 10 MG/ML IV BOLUS
INTRAVENOUS | Status: AC
Start: 2017-12-29 — End: 2017-12-29
  Filled 2017-12-29: qty 20

## 2017-12-29 MED ORDER — TAMSULOSIN HCL 0.4 MG PO CAPS: 0.4000 mg | ORAL_CAPSULE | Freq: Every day | ORAL | 2 refills | Status: DC

## 2017-12-29 MED ORDER — DIPHENHYDRAMINE HCL 12.5 MG/5ML PO ELIX: 12.5000 mg | ORAL_SOLUTION | Freq: Four times a day (QID) | ORAL | Status: DC | PRN

## 2017-12-29 MED ORDER — PHENYLEPHRINE 40 MCG/ML (10ML) SYRINGE FOR IV PUSH (FOR BLOOD PRESSURE SUPPORT)
PREFILLED_SYRINGE | INTRAVENOUS | Status: AC
  Filled 2017-12-29: qty 10

## 2017-12-29 MED ORDER — BACLOFEN 20 MG PO TABS
40.0000 mg | ORAL_TABLET | Freq: Three times a day (TID) | ORAL | Status: DC
  Administered 2017-12-30 – 2018-01-01 (×8): 40 mg via ORAL
  Filled 2017-12-29 (×3): qty 4
  Filled 2017-12-29 (×3): qty 2
  Filled 2017-12-29 (×2): qty 4

## 2017-12-29 MED ORDER — TRAMADOL HCL 50 MG PO TABS
50.0000 mg | ORAL_TABLET | Freq: Four times a day (QID) | ORAL | Status: DC | PRN
  Administered 2017-12-30: 50 mg via ORAL
  Filled 2017-12-29: qty 1

## 2017-12-29 MED ORDER — MORPHINE SULFATE (PF) 4 MG/ML IV SOLN: 2.0000 mg | INTRAVENOUS | Status: DC | PRN

## 2017-12-29 MED ORDER — PERPHENAZINE 2 MG PO TABS
2.0000 mg | ORAL_TABLET | Freq: Two times a day (BID) | ORAL | Status: DC
  Administered 2017-12-30 – 2018-01-01 (×5): 2 mg via ORAL
  Filled 2017-12-29 (×6): qty 1

## 2017-12-29 MED ORDER — ENOXAPARIN SODIUM 40 MG/0.4ML ~~LOC~~ SOLN
40.0000 mg | SUBCUTANEOUS | Status: DC
  Administered 2017-12-30 – 2018-01-01 (×3): 40 mg via SUBCUTANEOUS
  Filled 2017-12-29 (×3): qty 0.4

## 2017-12-29 MED ORDER — ESMOLOL BOLUS VIA INFUSION: 30.0000 ug | Freq: Once | INTRAVENOUS | Status: DC

## 2017-12-29 MED ORDER — CEFEPIME HCL 1 G IJ SOLR
1.0000 g | Freq: Three times a day (TID) | INTRAMUSCULAR | Status: DC
  Administered 2017-12-29 – 2017-12-31 (×5): 1 g via INTRAVENOUS
  Filled 2017-12-29 (×6): qty 1

## 2017-12-29 MED ORDER — VENLAFAXINE HCL ER 150 MG PO CP24
150.0000 mg | ORAL_CAPSULE | Freq: Every day | ORAL | Status: DC
  Administered 2017-12-30 – 2018-01-01 (×3): 150 mg via ORAL
  Filled 2017-12-29 (×3): qty 1

## 2017-12-29 MED ORDER — ZOLPIDEM TARTRATE 5 MG PO TABS: 5.0000 mg | ORAL_TABLET | Freq: Every evening | ORAL | Status: DC | PRN

## 2017-12-29 MED ORDER — ONDANSETRON HCL 4 MG/2ML IJ SOLN: 4.0000 mg | INTRAMUSCULAR | Status: DC | PRN

## 2017-12-29 MED ORDER — MIDAZOLAM HCL 5 MG/5ML IJ SOLN
INTRAMUSCULAR | Status: DC | PRN
  Administered 2017-12-29: 2 mg via INTRAVENOUS

## 2017-12-29 MED ORDER — 0.9 % SODIUM CHLORIDE (POUR BTL) OPTIME
TOPICAL | Status: DC | PRN
  Administered 2017-12-29: 1000 mL

## 2017-12-29 MED ORDER — CIPROFLOXACIN HCL 500 MG PO TABS: 500.0000 mg | ORAL_TABLET | Freq: Two times a day (BID) | ORAL | 0 refills | Status: AC

## 2017-12-29 MED ORDER — BELLADONNA ALKALOIDS-OPIUM 16.2-60 MG RE SUPP: 1.0000 | Freq: Four times a day (QID) | RECTAL | Status: DC | PRN

## 2017-12-29 MED ORDER — LEVALBUTEROL HCL 0.63 MG/3ML IN NEBU: 0.6300 mg | INHALATION_SOLUTION | Freq: Three times a day (TID) | RESPIRATORY_TRACT | Status: DC

## 2017-12-29 MED ORDER — TAMSULOSIN HCL 0.4 MG PO CAPS
0.4000 mg | ORAL_CAPSULE | Freq: Every day | ORAL | Status: DC
  Administered 2017-12-30 – 2018-01-01 (×3): 0.4 mg via ORAL
  Filled 2017-12-29 (×4): qty 1

## 2017-12-29 MED ORDER — LEVALBUTEROL HCL 1.25 MG/0.5ML IN NEBU
INHALATION_SOLUTION | RESPIRATORY_TRACT | Status: AC
  Administered 2017-12-29: 1.25 mg
  Filled 2017-12-29: qty 0.5

## 2017-12-29 MED ORDER — CIPROFLOXACIN HCL 500 MG PO TABS: 500.0000 mg | ORAL_TABLET | Freq: Two times a day (BID) | ORAL | Status: DC

## 2017-12-29 MED ORDER — HYDRALAZINE HCL 20 MG/ML IJ SOLN
10.0000 mg | Freq: Once | INTRAMUSCULAR | Status: AC
  Administered 2017-12-29: 10 mg via INTRAVENOUS

## 2017-12-29 MED ORDER — SODIUM CHLORIDE 0.9 % IR SOLN
Status: DC | PRN
  Administered 2017-12-29: 6000 mL via INTRAVESICAL

## 2017-12-29 MED ORDER — HYDRALAZINE HCL 20 MG/ML IJ SOLN
INTRAMUSCULAR | Status: AC
  Filled 2017-12-29: qty 1

## 2017-12-29 MED ORDER — LABETALOL HCL 5 MG/ML IV SOLN
INTRAVENOUS | Status: DC | PRN
  Administered 2017-12-29: 2.5 mg via INTRAVENOUS
  Administered 2017-12-29: 5 mg via INTRAVENOUS
  Administered 2017-12-29: 2.5 mg via INTRAVENOUS

## 2017-12-29 MED ORDER — KETOROLAC TROMETHAMINE 30 MG/ML IJ SOLN: 30.0000 mg | Freq: Once | INTRAMUSCULAR | Status: DC | PRN

## 2017-12-29 MED ORDER — LACTATED RINGERS IV SOLN
INTRAVENOUS | Status: DC | PRN
  Administered 2017-12-29: 10:00:00 via INTRAVENOUS

## 2017-12-29 MED ORDER — CIPROFLOXACIN IN D5W 400 MG/200ML IV SOLN
400.0000 mg | INTRAVENOUS | Status: AC
  Administered 2017-12-29: 400 mg via INTRAVENOUS
  Filled 2017-12-29: qty 200

## 2017-12-29 MED ORDER — DEXAMETHASONE SODIUM PHOSPHATE 10 MG/ML IJ SOLN
INTRAMUSCULAR | Status: DC | PRN
  Administered 2017-12-29: 10 mg via INTRAVENOUS

## 2017-12-29 MED ORDER — IOHEXOL 300 MG/ML  SOLN
INTRAMUSCULAR | Status: DC | PRN
  Administered 2017-12-29: 5 mL via URETHRAL

## 2017-12-29 MED ORDER — HYDROCHLOROTHIAZIDE 10 MG/ML ORAL SUSPENSION: 6.2500 mg | Freq: Every day | ORAL | Status: DC

## 2017-12-29 MED ORDER — VANCOMYCIN HCL IN DEXTROSE 1-5 GM/200ML-% IV SOLN
1000.0000 mg | Freq: Two times a day (BID) | INTRAVENOUS | Status: DC
  Administered 2017-12-30: 1000 mg via INTRAVENOUS
  Filled 2017-12-29: qty 200

## 2017-12-29 MED ORDER — PROPOFOL 10 MG/ML IV BOLUS
INTRAVENOUS | Status: DC | PRN
  Administered 2017-12-29: 150 mg via INTRAVENOUS

## 2017-12-29 MED ORDER — VITAMIN D (ERGOCALCIFEROL) 1.25 MG (50000 UNIT) PO CAPS
50000.0000 [IU] | ORAL_CAPSULE | ORAL | Status: DC
Start: 2018-01-02 — End: 2018-01-01

## 2017-12-29 MED ORDER — MIDAZOLAM HCL 2 MG/2ML IJ SOLN
INTRAMUSCULAR | Status: AC
  Filled 2017-12-29: qty 2

## 2017-12-29 MED ORDER — DEXTROSE-NACL 5-0.45 % IV SOLN: INTRAVENOUS | Status: DC

## 2017-12-29 MED ORDER — SCOPOLAMINE 1 MG/3DAYS TD PT72
1.0000 | MEDICATED_PATCH | TRANSDERMAL | Status: DC
  Administered 2017-12-30: 1.5 mg via TRANSDERMAL
  Filled 2017-12-29: qty 1

## 2017-12-29 MED ORDER — FENTANYL CITRATE (PF) 100 MCG/2ML IJ SOLN
INTRAMUSCULAR | Status: AC
Start: 2017-12-29 — End: 2017-12-29
  Filled 2017-12-29: qty 2

## 2017-12-29 MED ORDER — TRAMADOL HCL 50 MG PO TABS: 50.0000 mg | ORAL_TABLET | Freq: Four times a day (QID) | ORAL | Status: DC | PRN

## 2017-12-29 MED ORDER — SODIUM CHLORIDE 0.9 % IV SOLN
INTRAVENOUS | Status: DC
  Administered 2017-12-30 – 2018-01-01 (×3): via INTRAVENOUS

## 2017-12-29 MED ORDER — LABETALOL HCL 5 MG/ML IV SOLN
INTRAVENOUS | Status: AC
  Filled 2017-12-29: qty 4

## 2017-12-29 SURGICAL SUPPLY — 33 items
BAG URINE DRAINAGE (UROLOGICAL SUPPLIES) IMPLANT
BAG URO CATCHER STRL LF (MISCELLANEOUS) ×4 IMPLANT
BASKET LASER NITINOL 1.9FR (BASKET) IMPLANT
BASKET ZERO TIP NITINOL 2.4FR (BASKET) IMPLANT
BSKT STON RTRVL 120 1.9FR (BASKET)
BSKT STON RTRVL ZERO TP 2.4FR (BASKET)
CATH INTERMIT  6FR 70CM (CATHETERS) ×2 IMPLANT
CATH URET 5FR 28IN CONE TIP (BALLOONS)
CATH URET 5FR 70CM CONE TIP (BALLOONS) IMPLANT
CLOTH BEACON ORANGE TIMEOUT ST (SAFETY) ×4 IMPLANT
COVER FOOTSWITCH UNIV (MISCELLANEOUS) ×4 IMPLANT
ELECT REM PT RETURN 15FT ADLT (MISCELLANEOUS) ×4 IMPLANT
FIBER LASER FLEXIVA 365 (UROLOGICAL SUPPLIES) IMPLANT
FIBER LASER TRAC TIP (UROLOGICAL SUPPLIES) ×2 IMPLANT
GLOVE BIOGEL M STRL SZ7.5 (GLOVE) ×4 IMPLANT
GOWN STRL REUS W/TWL LRG LVL3 (GOWN DISPOSABLE) ×2 IMPLANT
GOWN STRL REUS W/TWL XL LVL3 (GOWN DISPOSABLE) ×4 IMPLANT
GUIDEWIRE ANG ZIPWIRE 038X150 (WIRE) IMPLANT
GUIDEWIRE STR DUAL SENSOR (WIRE) ×6 IMPLANT
INFUSOR MANOMETER BAG 3000ML (MISCELLANEOUS) IMPLANT
LOOP CUT BIPOLAR 24F LRG (ELECTROSURGICAL) ×2 IMPLANT
MANIFOLD NEPTUNE II (INSTRUMENTS) ×4 IMPLANT
PACK CYSTO (CUSTOM PROCEDURE TRAY) ×4 IMPLANT
SET ASPIRATION TUBING (TUBING) IMPLANT
SHEATH ACCESS URETERAL 24CM (SHEATH) IMPLANT
SHEATH ACCESS URETERAL 54CM (SHEATH) IMPLANT
SHEATH URETERAL 12FRX35CM (MISCELLANEOUS) ×2 IMPLANT
SLEEVE SURGEON STRL (DRAPES) ×2 IMPLANT
STENT URET 6FRX26 CONTOUR (STENTS) ×2 IMPLANT
SYRINGE IRR TOOMEY STRL 70CC (SYRINGE) ×2 IMPLANT
TUBING CONNECTING 10 (TUBING) ×3 IMPLANT
TUBING CONNECTING 10' (TUBING) ×1
TUBING UROLOGY SET (TUBING) ×4 IMPLANT

## 2017-12-29 NOTE — Anesthesia Postprocedure Evaluation (Signed)
Anesthesia Post Note  Patient: Samuel [name]  Procedure(s) Performed: CYSTOSCOPY WITH RIGHT  RETROGRADE PYELOGRAM, URETEROSCOPY/LASER LITHOTRIPSY AND STENT PLACEMENT (Right ) TRANSURETHRAL RESECTION OF BLADDER TUMOR (N/A )     Patient location during evaluation: PACU Anesthesia Type: General Level of consciousness: lethargic Pain management: pain level controlled Vital Signs Assessment: vitals unstable Respiratory status: spontaneous breathing Cardiovascular status: tachycardic and blood pressure returned to baseline Postop Assessment: no apparent nausea or vomiting Anesthetic complications: no Comments: Patient became septic in PACU with tachycardia, fever, and  mottling of extremities. CXR/ABG obtained, CCM consulted as patient will have to go to ICU. Placed on BIPAP while in PACU which slowed his respirations down from 25 to 14    Last Vitals:  Vitals:   12/29/17 1556 12/29/17 1600  BP:  (!) 152/106  Pulse:    Resp: (!) 31 (!) 33  Temp:  (!) 39.4 C  SpO2:  98%    Last Pain:  Vitals:   12/29/17 1600  TempSrc: Oral  PainSc: 0-No pain                 Velora Horstman S

## 2017-12-29 NOTE — Op Note (Signed)
Operative Note  Preoperative diagnosis:  1.  Bladder mass of posterior bladder wall 2.  Right renal calculus  Postoperative diagnosis: 1.  Bladder mass posterior bladder wall 2.  Right renal calculus  Procedure(s): 1.  Transurethral resection of bladder tumor-medium 2.  Cystoscopy with right retrograde pyelogram with interpretation, right ureteroscopy with laser lithotripsy and ureteral stent exchange, fluoroscopy less than 1 hour  Surgeon: Modena Slater, MD  Assistants: None  Anesthesia: General  Complications: None immediate  EBL: Minimal  Specimens: 1.  Bladder mass of posterior wall  Drains/Catheters: 1.  Suprapubic catheter 2.  6 x 26 double-J ureteral stent with a string in place  Intraoperative findings: 1.  Normal anterior urethra 2.  High riding stiff bladder neck 3.  Papillary appearing bladder mass on the posterior bladder wall near the trigone well away from bilateral ureteral orifices which was a bladder tumor versus papillary edema secondary to chronic suprapubic catheter.  There were no other bladder wall masses. 4.  Right retrograde pyelogram revealed a mildly dilated collecting system and a radiopaque calculus. 5.  Approximately 1 cm and 2 smaller right renal calculi fragmented to tiny fragments  Indication: 57 year old male who was evaluated in the urology office with cystoscopy and was found to have a possible bladder tumor.  He subsequently failed to follow-up but presented to the hospital with a febrile UTI and associated right ureteropelvic junction calculus and underwent a urgent ureteral stent placement.  He presents for the previously mentioned operations.  Description of procedure:  The patient was identified and consent was obtained.  The patient was taken to the operating room and placed in the supine position.  The patient was placed under general anesthesia.  Perioperative antibiotics were administered.  The patient was placed in dorsal lithotomy.   Patient was prepped and draped in a standard sterile fashion and a timeout was performed.  A 21 French rigid cystoscope was advanced into the urethra and into the bladder.  Complete cystoscopy was performed with findings noted above.  The scope was exchanged for a 51 French resectoscope with a visual obturator in place which was then exchanged for the working element.  The area of interest was resected and cautery was used for hemostasis.  The specimen was collected and passed off for permanent pathology.  The bladder was reinspected and there was no active bleeding noted.  The scope was exchanged for a 21 French rigid cystoscope which was advanced into the bladder.  The end of the stent was grasped and pulled just beyond the urethral meatus.  A wire was advanced through the stent up to the kidney under fluoroscopic guidance and the stent was withdrawn.  A 12 x 14 access sheath was advanced over the wire under continuous fluoroscopic guidance of the ureter.  The inner sheath was removed and a second wire was advanced through the lumen of the sheath up to the kidney under fluoroscopic guidance which confirmed to wires all the way up to the kidney.  The sheath was removed and 1 of the wires was secured to the drape as a safety wire and the other wire was used to advance the ureteral access sheath again over this wire under continuous fluoroscopic guidance of the ureter.  The wire and inner sheath were withdrawn.  A flexible ureteroscope was advanced down to the kidney and complete pyeloscopy was performed with the findings noted above.  The stones of interest were encountered and were fragmented to tiny fragments.  Complete pyeloscopy was  again performed and there were no clinically significant stone fragments, only tiny fragments too small to basket.  A retrograde pyelogram was performed through the scope with the findings noted above.  The scope along with the access sheath were withdrawn and there were no  ureteral injury or ureteral calculi noted.  A 6 x 26 double-J ureteral stent was advanced over the wire under fluoroscopic guidance and the wire was withdrawn.  Fluoroscopy confirmed good proximal and distal placement as evidenced by a good coil within the kidney as well as in the bladder.  The string was secured to the penis.  This concluded the operation.  The patient tolerated the procedure well and was stable postoperatively.  Plan: The patient will have his stent removed in approximately 1 week at home.  He can follow-up in several weeks with a renal ultrasound and KUB.

## 2017-12-29 NOTE — Consult Note (Signed)
Name: Samuel [name] MRN: [id number] DOB: March 08, 1961    ADMISSION DATE:  12/29/2017 CONSULTATION DATE:  12/29/2017   REFERRING MD :  Alvester Morin, urology MD  CHIEF COMPLAINT:  resp distress post-op  HISTORY OF PRESENT ILLNESS: 57 year old functional quadriplegic due to advanced multiple sclerosis with chronic indwelling suprapubic catheter for neurogenic bladder admitted 4/9 with urosepsis due to Citrobacter and urine culture also growing enterococcus on 4/10.  He was also found to have a right ureteropelvic junction calculus and underwent emergent stent placement.  He had been evaluated earlier in the urology office with cystoscopy and felt to have possible bladder tumor. On 4/29 he underwent elective cystoscopy with right ureteroscopy and stent exchange and transurethral resection of bladder mass on the posterior bladder wall.  Renal calculi was removed, postoperatively in the PACU he developed respiratory distress and was found to have a left lower lobe pneumonia on chest x-ray. He was placed on BiPAP and transferred to PICU and PCCM was consulted.  He was noted to be febrile to 103  SIGNIFICANT EVENTS    STUDIES:  CT abdomen 4/10 >> Mild prominence of the right intrarenal collecting system. There are right intrarenal stones, largest a renal pelvis stone measuring 11 mm.     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  has a past surgical history that includes Appendectomy (age 19); Nasal septum surgery (1996); NEGATIVE SLEEP STUDY  (1996); transthoracic echocardiogram (02-07-2004); Insertion of suprapubic catheter (N/A, 03/21/2014); Cystoscopy (N/A, 03/21/2014); Cystoscopy with retrograde pyelogram, ureteroscopy and stent placement (Right, 12/11/2017); Cystoscopy with retrograde pyelogram, ureteroscopy and stent placement (Right, 12/29/2017); and Cystoscopy with biopsy (N/A, 12/29/2017). Prior to Admission medications   Medication Sig Start Date End Date Taking? Authorizing Provider  baclofen (LIORESAL) 20 MG tablet TAKE TWO TABLETS BY MOUTH THREE TIMES DAILY 04/09/17  Yes York Spaniel, MD  diazepam (VALIUM) 5 MG tablet Take 1 tablet (5 mg total) by mouth 3 (three) times daily. 11/27/17  Yes York Spaniel, MD  docusate sodium (COLACE) 250 MG capsule Take 250 mg by mouth daily.   Yes [provider]  levothyroxine (SYNTHROID, LEVOTHROID) 125 MCG tablet Take 125 mcg by mouth daily. 05/22/15  Yes [provider]  lisinopril-hydrochlorothiazide (PRINZIDE,ZESTORETIC) 10-12.5 MG tablet Take 0.5 tablets by mouth every morning. 09/05/15  Yes [provider]  omeprazole (PRILOSEC) 20 MG capsule Take 20 mg by mouth every morning.    Yes [provider]  perphenazine (TRILAFON) 2 MG tablet TAKE 1 TABLET BY MOUTH TWICE DAILY 11/17/17  Yes York Spaniel, MD  tiZANidine (ZANAFLEX) 4 MG tablet Take 2 tablets (8 mg total) by mouth 3 (three) times daily. Please call [phone] to schedule an appt. 12/22/17  Yes York Spaniel, MD  trazodone (DESYREL) 300 MG tablet TAKE 1 TABLET BY MOUTH ONCE DAILY AT BEDTIME 11/24/17  Yes York Spaniel, MD  venlafaxine XR (EFFEXOR-XR) 75 MG 24 hr capsule TAKE 2 CAPSULES BY MOUTH ONCE DAILY WITH BREAKFAST 11/10/17  Yes York Spaniel, MD  acetic acid 0.25 % irrigation Irrigate with as directed daily. Instill 30ml and clamp tube for 30 minutes then drain. 06/03/15   Annabell Howells,  Jonny Ruiz, MD  ciprofloxacin (CIPRO) 500 MG tablet Take 1 tablet (500 mg total) by mouth 2 (two) times daily  for 7 days. 12/29/17 01/05/18  [name], MD  HYDROcodone-acetaminophen (NORCO/VICODIN) 5-325 MG tablet Take 1 tablet by mouth every 6 (six) hours as needed. 05/21/16   [name], NP  scopolamine (TRANSDERM-SCOP, 1.5 MG,) 1 MG/3DAYS Place 1 patch (1.5 mg total) onto the skin every 3 (three) days. 05/18/15   York Spaniel, MD  tamsulosin (FLOMAX) 0.4 MG CAPS capsule Take 1 capsule (0.4 mg total) by mouth daily. 12/29/17   [name], MD  traMADol (ULTRAM) 50 MG tablet Take 1 tablet (50 mg total) by mouth every 6 (six) hours as needed. 12/12/17   Purohit, Salli Quarry, MD  Vitamin D, Ergocalciferol, (DRISDOL) 50000 UNITS CAPS Take 50,000 Units by mouth every 7 (seven) days. Friday only    [provider]   Allergies  Allergen Reactions  . Penicillins Other (See Comments)    Unknown childhoood reaction (takes amoxicillian with reaction) Has tolerated ceftriaxone, cefepime, and Primaxin during inpatient admissions  . Primaxin [Imipenem] Other (See Comments)    Seizure     FAMILY HISTORY:  family history includes Cirrhosis in his brother; Hypothyroidism in his sister; Stroke in his father. SOCIAL HISTORY:  reports that he quit smoking about 32 years ago. His smoking use included cigarettes. He has a 12.00 pack-year smoking history. He has never used smokeless tobacco. He reports that he does not drink alcohol or use drugs.  REVIEW OF SYSTEMS:   Unable to obtain detailed since on BiPAP. Complains of abdominal pain. Febrile to 103  SUBJECTIVE:   VITAL SIGNS: Temp:  [97.6 F (36.4 C)-103 F (39.4 C)] 102 F (38.9 C) (04/29 1715) Pulse Rate:  [95-136] 136 (04/29 1530) Resp:  [14-45] 33 (04/29 1715) BP: (123-166)/(87-124) 149/108 (04/29 1715) SpO2:  [90 %-100 %] 97 % (04/29 1715) FiO2 (%):  [60 %-100 %] 100 % (04/29 1715) Weight:  [158 lb 8 oz (71.9 kg)-160 lb 7.9 oz (72.8 kg)] 160 lb 7.9 oz (72.8 kg) (04/29 1600)  PHYSICAL EXAMINATION: General: Chronically  ill-appearing, thin frail man on BiPAP with full facemask Neuro: Awake and interactive, able to speak a few words through BiPAP mask, able to move left upper extremity against gravity otherwise plegic in all other extremities HEENT: No pallor, icterus or JVD Cardiovascular: S1-S2 tacky, bounding pulses Lungs: Decreased breath sounds left base, good air entry in the right Abdomen: Soft, nontender, blood around penile meatus Musculoskeletal: Contractures both lower extremity and foot drop Skin: No rash  Recent Labs  Lab 12/29/17 1506  NA 136  K 4.4  CL 101  CO2 19*  BUN 13  CREATININE 0.87  GLUCOSE 154*   Recent Labs  Lab 12/29/17 1506  HGB 16.6  HCT 50.7  WBC 11.8*  PLT 329   X-ray Chest Pa Or Ap  Result Date: 12/29/2017 CLINICAL DATA:  Increasing shortness of breath and tachycardia after cystoscopy. EXAM: CHEST  1 VIEW COMPARISON:  Chest x-ray dated December 09, 2017. FINDINGS: The patient is rotated to the right. The heart size and mediastinal contours are within normal limits. Normal pulmonary vascularity. New airspace disease at the left lung base. No pleural effusion or pneumothorax. No acute osseous abnormality. IMPRESSION: 1. New airspace disease at the left lung base, suspicious for aspiration given clinical history. Electronically Signed   By: Obie Dredge M.D.   On: 12/29/2017 14:32   Dg C-arm  1-60 Min-no Report  Result Date: 12/29/2017 Fluoroscopy was utilized by the requesting physician.  No radiographic interpretation.    ASSESSMENT / PLAN:  Acute respiratory failure, Postop -Appears to be related to aspiration pneumonia, left lower lobe noted on chest x-ray  -Continue BiPAP for now, PO2 appears good on ABG and although his work of breathing seems to vary, respiratory rate is 25 currently and he is able to speak through BiPAP mask, hopefully we can avoid intubation here  Sepsis due to UTI and instrumentation -recent history of Citrobacter sepsis pansensitive and  enterococcal UTI, allergic to penicillins and imipenem noted  -We will use cefepime and vancomycin while awaiting cultures Received 2 L of fluid, will check a lactate but blood pressure normal is reassuring -Continue IV normal saline at 125 an hour. Decreased fever with Tylenol and cooling blanket if necessary if temperature persistently above 102   Will hold all oral meds for now, hold lisinopril and thiazide until creatinine noted to be stable  Discussed with urology.   Cyril Mourning MD. Tonny Bollman. [facility] Pager [phone] If no response call [phone]     12/29/2017, 5:40 PM

## 2017-12-29 NOTE — Progress Notes (Signed)
Postoperatively, the patient had difficulty breathing tachypnea, hypertension.  He soon became febrile.  X-ray revealed possible aspiration. He was placed on BiPAP and due to the concern for respiratory distress as well as sepsis, the ICU was called.  The intensivist kindly assesed him.  We will broaden out his antibiotics, obtain pan cultures, continue IV fluids.  He will remain in the ICU for now.  He currently continues on BiPAP.  Respiratory distress is improving.  ABG was encouraging.  He was mentating well upon assessment.  Foley catheter is draining bright red urine as expected for recent surgery.  Highly suspect urosepsis following ureteroscopy.  Appreciate ICU care.  I will defer to the ICU in regards to treatment of possible aspiration that was seen on chest x-ray.

## 2017-12-29 NOTE — Discharge Instructions (Signed)
[facility] [phone] Post Ureteroscopy With or Without Stent Instructions  Remove the stent in 1 week, next Monday at 7 AM by gently pulling the string.  The stent is about 1 foot long.  Complete the course of antibiotics that were prescribed  Definitions:  Ureter: The duct that transports urine from the kidney to the bladder. Stent:   A plastic hollow tube that is placed into the ureter, from the kidney to the bladder to prevent the ureter from swelling shut.  GENERAL INSTRUCTIONS:  Despite the fact that no skin incisions were used, the area around the ureter and bladder is raw and irritated. The stent is a foreign body which will further irritate the bladder wall. This irritation is manifested by increased frequency of urination, both day and night, and by an increase in the urge to urinate. In some, the urge to urinate is present almost always. Sometimes the urge is strong enough that you may not be able to stop yourself from urinating. The only real cure is to remove the stent and then give time for the bladder wall to heal which can't be done until the danger of the ureter swelling shut has passed, which varies.  You may see some blood in your urine while the stent is in place and a few days afterwards. Do not be alarmed, even if the urine was clear for a while. Get off your feet and drink lots of fluids until clearing occurs. If you start to pass clots or don't improve, call us.  DIET: You may return to your normal diet immediately. Because of the raw surface of your bladder, alcohol, spicy foods, acid type foods and drinks with caffeine may cause irritation or frequency and should be used in moderation. To keep your urine flowing freely and to avoid constipation, drink plenty of fluids during the day ( 8-10 glasses ). Tip: Avoid cranberry juice because it is very acidic.  ACTIVITY: Your physical activity doesn't need to be restricted. However, if you are very  active, you may see some blood in your urine. We suggest that you reduce your activity under these circumstances until the bleeding has stopped.  BOWELS: It is important to keep your bowels regular during the postoperative period. Straining with bowel movements can cause bleeding. A bowel movement every other day is reasonable. Use a mild laxative if needed, such as Milk of Magnesia 2-3 tablespoons, or 2 Dulcolax tablets. Call if you continue to have problems. If you have been taking narcotics for pain, before, during or after your surgery, you may be constipated. Take a laxative if necessary.   MEDICATION: You should resume your pre-surgery medications unless told not to. You may take oxybutynin or flomax if prescribed for bladder spasms or discomfort from the stent Take pain medication as directed for pain refractory to conservative management  PROBLEMS YOU SHOULD REPORT TO [place]:  Fevers over 100.5 Fahrenheit.  Heavy bleeding, or clots ( See above notes about blood in urine ).  Inability to urinate.  Drug reactions ( hives, rash, nausea, vomiting, diarrhea ).  Severe burning or pain with urination that is not improving.

## 2017-12-29 NOTE — Transfer of Care (Signed)
Immediate Anesthesia Transfer of Care Note  Patient: Samuel [name]  Procedure(s) Performed: CYSTOSCOPY WITH RIGHT  RETROGRADE PYELOGRAM, URETEROSCOPY/LASER LITHOTRIPSY AND STENT PLACEMENT (Right ) TRANSURETHRAL RESECTION OF BLADDER TUMOR (N/A )  Patient Location: PACU  Anesthesia Type:General  Level of Consciousness: awake, alert  and oriented  Airway & Oxygen Therapy: Patient Spontanous Breathing and Patient connected to face mask oxygen  Post-op Assessment: Report given to RN  Post vital signs: Reviewed and stable  Last Vitals:  Vitals Value Taken Time  BP 168/119 12/29/2017  1:15 PM  Temp    Pulse 109 12/29/2017  1:19 PM  Resp 15 12/29/2017  1:19 PM  SpO2 90 % 12/29/2017  1:19 PM  Vitals shown include unvalidated device data.  Last Pain:  Vitals:   12/29/17 0929  TempSrc: Oral      Patients Stated Pain Goal: 4 (12/29/17 1001)  Complications: No apparent anesthesia complications

## 2017-12-29 NOTE — Interval H&P Note (Signed)
History and Physical Interval Note:  12/29/2017 11:16 AM  Samuel [name]  has presented today for surgery, with the diagnosis of RIGHT RENAL CALCULUS, BLADDER MASS  The various methods of treatment have been discussed with the patient and family. After consideration of risks, benefits and other options for treatment, the patient has consented to  Procedure(s): CYSTOSCOPY WITH RIGHT  RETROGRADE PYELOGRAM, URETEROSCOPYLASER LITHOTRIPSY AND STENT PLACEMENT (Right) CYSTOSCOPY WITH BIOPSY AND FULGURATION VS TRANSURETHRAL RESECTION OF BLADDER TUMOR (N/A) as a surgical intervention .  The patient's history has been reviewed, patient examined, no change in status, stable for surgery.  I have reviewed the patient's chart and labs.  Questions were answered to the patient's satisfaction.    Patient is status post urgent right ureteral stent placement for fever with associated right ureteropelvic junction calculus.  He also has a history of chronic indwelling catheter and in the office, he had a possible suspicious area on the bladder that warrants further investigation and possible biopsy.  He presents to undergo the above operation.  Ray Church, III

## 2017-12-29 NOTE — Anesthesia Preprocedure Evaluation (Signed)
Anesthesia Evaluation  Patient identified by MRN, date of birth, ID band Patient awake    Reviewed: Allergy & Precautions, NPO status , Patient's Chart, lab work & pertinent test results  Airway Mallampati: II  TM Distance: >3 FB Neck ROM: Full    Dental no notable dental hx.    Pulmonary neg pulmonary ROS, former smoker,    Pulmonary exam normal breath sounds clear to auscultation       Cardiovascular hypertension, Normal cardiovascular exam Rhythm:Regular Rate:Normal     Neuro/Psych MS Spastic hemiplegia negative neurological ROS     GI/Hepatic negative GI ROS, Neg liver ROS,   Endo/Other  negative endocrine ROS  Renal/GU negative Renal ROS  negative genitourinary   Musculoskeletal negative musculoskeletal ROS (+)   Abdominal   Peds negative pediatric ROS (+)  Hematology negative hematology ROS (+)   Anesthesia Other Findings   Reproductive/Obstetrics negative OB ROS                             Anesthesia Physical Anesthesia Plan  ASA: III  Anesthesia Plan: General   Post-op Pain Management:    Induction: Intravenous  PONV Risk Score and Plan: 2 and Ondansetron, Dexamethasone and Treatment may vary due to age or medical condition  Airway Management Planned: LMA  Additional Equipment:   Intra-op Plan:   Post-operative Plan: Extubation in OR  Informed Consent: I have reviewed the patients History and Physical, chart, labs and discussed the procedure including the risks, benefits and alternatives for the proposed anesthesia with the patient or authorized representative who has indicated his/her understanding and acceptance.   Dental advisory given  Plan Discussed with: CRNA and Surgeon  Anesthesia Plan Comments:         Anesthesia Quick Evaluation

## 2017-12-29 NOTE — Progress Notes (Signed)
Pharmacy Antibiotic Note  Samuel [name] is a 57 y.o. male admitted on 12/29/2017 for elective cystoscopy  with right ureteroscopy and stent exchange and transurethral resection of bladder mass on the posterior bladder wall.  Pt is a functional quadriplegic due to advanced multiple sclerosis with chronic indwelling suprapubic catheter. Recently (4/10) admitted with urosepsis.Marland Kitchen  Pharmacy has been consulted for vancomycin and cefepime dosing for HCAP.  Plan: Vancomycin 1500mg  IV x 1 then 1gm IV q12h (AUC 500.4, Scr 0.87) Cefepime 1gm IV q8h Follow renal function, cultures and clinical course  Height: 6\' 1"  (185.4 cm) Weight: 160 lb 7.9 oz (72.8 kg) IBW/kg (Calculated) : 79.9  Temp (24hrs), Avg:100.6 F (38.1 C), Min:97.6 F (36.4 C), Max:103 F (39.4 C)  Recent Labs  Lab 12/29/17 1506  WBC 11.8*  CREATININE 0.87    Estimated Creatinine Clearance: 96.5 mL/min (by C-G formula based on SCr of 0.87 mg/dL).    Allergies  Allergen Reactions  . Penicillins Other (See Comments)    Unknown childhoood reaction (takes amoxicillian with reaction) Has tolerated ceftriaxone, cefepime, and Primaxin during inpatient admissions  . Primaxin [Imipenem] Other (See Comments)    Seizure     Antimicrobials this admission: 4/29 vanc >> 4/26 cefepime >> Dose adjustments this admission:   Microbiology results: 4/29 BCx:  4/29 UCx:  4/29 MRSA PCR: neg  Thank you for allowing pharmacy to be a part of this patient's care.  Arley Phenix RPh 12/29/2017, 5:57 PM Pager [phone]

## 2017-12-29 NOTE — Anesthesia Procedure Notes (Signed)
Procedure Name: LMA Insertion Date/Time: 12/29/2017 12:01 PM Performed by: Theodosia Quay, CRNA Pre-anesthesia Checklist: Patient identified, Emergency Drugs available, Suction available, Patient being monitored and Timeout performed Patient Re-evaluated:Patient Re-evaluated prior to induction Oxygen Delivery Method: Circle system utilized Preoxygenation: Pre-oxygenation with 100% oxygen Induction Type: IV induction Ventilation: Mask ventilation without difficulty LMA: LMA inserted Tube size: 4.0 mm Number of attempts: 1 Placement Confirmation: positive ETCO2 and breath sounds checked- equal and bilateral Tube secured with: Tape Dental Injury: Teeth and Oropharynx as per pre-operative assessment  Comments: Bite block.

## 2017-12-30 DIAGNOSIS — J95821 Acute postprocedural respiratory failure: Secondary | ICD-10-CM | POA: Diagnosis not present

## 2017-12-30 DIAGNOSIS — R509 Fever, unspecified: Secondary | ICD-10-CM | POA: Diagnosis present

## 2017-12-30 DIAGNOSIS — N319 Neuromuscular dysfunction of bladder, unspecified: Secondary | ICD-10-CM | POA: Diagnosis present

## 2017-12-30 DIAGNOSIS — Y838 Other surgical procedures as the cause of abnormal reaction of the patient, or of later complication, without mention of misadventure at the time of the procedure: Secondary | ICD-10-CM | POA: Diagnosis not present

## 2017-12-30 DIAGNOSIS — E559 Vitamin D deficiency, unspecified: Secondary | ICD-10-CM | POA: Diagnosis present

## 2017-12-30 DIAGNOSIS — A419 Sepsis, unspecified organism: Secondary | ICD-10-CM | POA: Diagnosis not present

## 2017-12-30 DIAGNOSIS — T8144XA Sepsis following a procedure, initial encounter: Secondary | ICD-10-CM | POA: Diagnosis not present

## 2017-12-30 DIAGNOSIS — Z79890 Hormone replacement therapy (postmenopausal): Secondary | ICD-10-CM | POA: Diagnosis not present

## 2017-12-30 DIAGNOSIS — Z936 Other artificial openings of urinary tract status: Secondary | ICD-10-CM | POA: Diagnosis not present

## 2017-12-30 DIAGNOSIS — Z79899 Other long term (current) drug therapy: Secondary | ICD-10-CM | POA: Diagnosis not present

## 2017-12-30 DIAGNOSIS — Z87891 Personal history of nicotine dependence: Secondary | ICD-10-CM | POA: Diagnosis not present

## 2017-12-30 DIAGNOSIS — E039 Hypothyroidism, unspecified: Secondary | ICD-10-CM | POA: Diagnosis present

## 2017-12-30 DIAGNOSIS — J690 Pneumonitis due to inhalation of food and vomit: Secondary | ICD-10-CM | POA: Diagnosis not present

## 2017-12-30 DIAGNOSIS — Z880 Allergy status to penicillin: Secondary | ICD-10-CM | POA: Diagnosis not present

## 2017-12-30 DIAGNOSIS — N202 Calculus of kidney with calculus of ureter: Secondary | ICD-10-CM | POA: Diagnosis present

## 2017-12-30 DIAGNOSIS — Y733 Surgical instruments, materials and gastroenterology and urology devices (including sutures) associated with adverse incidents: Secondary | ICD-10-CM | POA: Diagnosis not present

## 2017-12-30 DIAGNOSIS — K219 Gastro-esophageal reflux disease without esophagitis: Secondary | ICD-10-CM | POA: Diagnosis present

## 2017-12-30 DIAGNOSIS — Z87442 Personal history of urinary calculi: Secondary | ICD-10-CM | POA: Diagnosis not present

## 2017-12-30 DIAGNOSIS — G8929 Other chronic pain: Secondary | ICD-10-CM | POA: Diagnosis present

## 2017-12-30 DIAGNOSIS — N3090 Cystitis, unspecified without hematuria: Secondary | ICD-10-CM | POA: Diagnosis present

## 2017-12-30 DIAGNOSIS — K592 Neurogenic bowel, not elsewhere classified: Secondary | ICD-10-CM | POA: Diagnosis present

## 2017-12-30 DIAGNOSIS — I10 Essential (primary) hypertension: Secondary | ICD-10-CM | POA: Diagnosis present

## 2017-12-30 DIAGNOSIS — G35 Multiple sclerosis: Secondary | ICD-10-CM | POA: Diagnosis present

## 2017-12-30 DIAGNOSIS — E785 Hyperlipidemia, unspecified: Secondary | ICD-10-CM | POA: Diagnosis present

## 2017-12-30 DIAGNOSIS — R532 Functional quadriplegia: Secondary | ICD-10-CM | POA: Diagnosis present

## 2017-12-30 DIAGNOSIS — T17908A Unspecified foreign body in respiratory tract, part unspecified causing other injury, initial encounter: Secondary | ICD-10-CM | POA: Diagnosis not present

## 2017-12-30 DIAGNOSIS — R5382 Chronic fatigue, unspecified: Secondary | ICD-10-CM | POA: Diagnosis present

## 2017-12-30 MED ORDER — VANCOMYCIN HCL IN DEXTROSE 1-5 GM/200ML-% IV SOLN
1000.0000 mg | Freq: Two times a day (BID) | INTRAVENOUS | Status: DC
  Administered 2017-12-30 – 2017-12-31 (×2): 1000 mg via INTRAVENOUS
  Filled 2017-12-30 (×2): qty 200

## 2017-12-30 NOTE — Progress Notes (Signed)
Name: Samuel [name] MRN: [id number] DOB: 1960/09/17    ADMISSION DATE:  12/29/2017 CONSULTATION DATE:  12/30/2017   REFERRING MD :  Alvester Morin, urology MD  CHIEF COMPLAINT:  resp distress post-op  HISTORY OF PRESENT ILLNESS: 57 year old functional quadriplegic due to advanced multiple sclerosis with chronic indwelling suprapubic catheter for neurogenic bladder admitted 4/9 with urosepsis due to Citrobacter and urine culture also growing enterococcus on 4/10.  He was also found to have a right ureteropelvic junction calculus and underwent emergent stent placement.  He had been evaluated earlier in the urology office with cystoscopy and felt to have possible bladder tumor. On 4/29 he underwent elective cystoscopy with right ureteroscopy and stent exchange and transurethral resection of bladder mass on the posterior bladder wall.  Renal calculi was removed, postoperatively in the PACU he developed respiratory distress and was found to have a left lower lobe pneumonia on chest x-ray. He was placed on BiPAP and transferred to PICU and PCCM was consulted.  He was noted to be febrile to 103  SIGNIFICANT EVENTS    STUDIES:  CT abdomen 4/10 >> Mild prominence of the right intrarenal collecting system. There are right intrarenal stones, largest a renal pelvis stone measuring 11 mm.    SUBJECTIVE:  Much improved On [place], no distress Able to speak in full sentences defervesced after T 103 around 4pm   VITAL SIGNS: Temp:  [97.6 F (36.4 C)-103 F (39.4 C)] 99 F (37.2 C) (04/30 0800) Pulse Rate:  [97-136] 97 (04/29 2349) Resp:  [14-45] 20 (04/30 0800) BP: (106-166)/(68-124) 106/69 (04/30 0800) SpO2:  [90 %-100 %] 98 % (04/30 0800) FiO2 (%):  [30 %-100 %] 30 % (04/29 2349) Weight:  [160 lb 7.9 oz (72.8 kg)] 160 lb 7.9 oz (72.8 kg) (04/29 1600)  PHYSICAL EXAMINATION: General: Chronically ill-appearing, thin frail man on [place] Neuro: Awake and interactive,  Power 3/5 in LUE, 1/5 in all other  extremities HEENT: No pallor, icterus or JVD Cardiovascular: S1-S2 tacky, bounding pulses Lungs: Decreased breath sounds left base, good air entry in the right Abdomen: Soft, nontender, bloody urine Musculoskeletal: Contractures both lower extremity and foot drop Skin: No rash  Recent Labs  Lab 12/29/17 1506  NA 136  K 4.4  CL 101  CO2 19*  BUN 13  CREATININE 0.87  GLUCOSE 154*   Recent Labs  Lab 12/29/17 1506  HGB 16.6  HCT 50.7  WBC 11.8*  PLT 329   X-ray Chest Pa Or Ap  Result Date: 12/29/2017 CLINICAL DATA:  Increasing shortness of breath and tachycardia after cystoscopy. EXAM: CHEST  1 VIEW COMPARISON:  Chest x-ray dated December 09, 2017. FINDINGS: The patient is rotated to the right. The heart size and mediastinal contours are within normal limits. Normal pulmonary vascularity. New airspace disease at the left lung base. No pleural effusion or pneumothorax. No acute osseous abnormality. IMPRESSION: 1. New airspace disease at the left lung base, suspicious for aspiration given clinical history. Electronically Signed   By: Obie Dredge M.D.   On: 12/29/2017 14:32   Dg C-arm 1-60 Min-no Report  Result Date: 12/29/2017 Fluoroscopy was utilized by the requesting physician.  No radiographic interpretation.    ASSESSMENT / PLAN:  Acute respiratory failure, Postop -due to aspiration pneumonia, left lower lobe   -much improved -taper off O2  Sepsis due to UTI and instrumentation -recent history of Citrobacter sepsis pansensitive and enterococcal UTI, allergic to penicillins and imipenem noted  -Ct cefepime, dc vancomycin since MRSA pcr  neg -drop IV normal saline at 50 an hour. - hold lisinopril and thiazide until creatinine noted to be stable  Transfer to floor, can switch to oral abx tomorrow if remains stable, plan 7ds total     Cyril Mourning MD. FCCP. [place] Pager [phone] If no response call [phone]     12/30/2017, 9:40 AM

## 2017-12-30 NOTE — Progress Notes (Signed)
Nutrition Brief Note  Patient identified on the low [place] screening report.   Wt Readings from Last 15 Encounters:  12/29/17 160 lb 7.9 oz (72.8 kg)  12/11/17 160 lb (72.6 kg)  11/12/16 160 lb (72.6 kg)  10/10/15 128 lb 8.5 oz (58.3 kg)  06/03/15 142 lb (64.4 kg)  07/12/14 208 lb 1.8 oz (94.4 kg)  06/28/14 174 lb (78.9 kg)  03/21/14 175 lb (79.4 kg)  03/02/14 171 lb 11.8 oz (77.9 kg)  09/09/13 190 lb (86.2 kg)  10/20/12 200 lb (90.7 kg)  02/08/13 190 lb (86.2 kg)  12/25/12 195 lb (88.5 kg)  11/27/12 195 lb (88.5 kg)  09/09/12 195 lb (88.5 kg)    Body mass index is 21.17 kg/m. Patient meets criteria for normal weight based on current BMI. R abdominal incision from 4/11. He has hx of advanced MS with associated functional quadriplegia and has a chronic indwelling suprapubic catheter d/t neurogenic bladder. He was admitted to the hospital on 4/9 for urosepsis and underweight stent placement d/t R ureteropelvic junction calculus. He was seen outpatient by Urology yesterday and found to have a possible bladder mass. Yesterday he underwent elective cystoscopy, stent exchange, and transurethral resection of mass.  Current diet order is NPO. Medications reviewed; 250 mg Colace one/day, 125 mcg oral Synthroid/day, 40 mg Protonix/day, 50000 units Drisdol every 7 days.  Labs reviewed. IVF: LR @ 125 mL/hr.    No nutrition interventions warranted at this time. If nutrition issues arise, please consult RD.     Trenton Gammon, MS, RD, LDN, [facility] Inpatient Clinical Dietitian Pager # [phone] After hours/weekend pager # [phone]

## 2017-12-30 NOTE — Progress Notes (Signed)
Urology Inpatient Progress Report  Ureteral stone [N20.1]  Procedure(s): CYSTOSCOPY WITH RIGHT  RETROGRADE PYELOGRAM, URETEROSCOPY/LASER LITHOTRIPSY AND STENT PLACEMENT TRANSURETHRAL RESECTION OF BLADDER TUMOR  1 Day Post-Op   Intv/Subj: Afebrile today on cefepime. Respiratory status much improved, now on RA. Bcx neg x 24 hrs. VSS. No compaints  Active Problems:   Ureteral calculus   Ureteral stone  Current Facility-Administered Medications  Medication Dose Route Frequency Provider Last Rate Last Dose  . 0.9 %  sodium chloride infusion   Intravenous Continuous Oretha Milch, MD 50 mL/hr at 12/30/17 1045    . acetaminophen (TYLENOL) suppository 650 mg  650 mg Rectal Q4H PRN Oretha Milch, MD      . acetaminophen (TYLENOL) tablet 650 mg  650 mg Oral Q4H PRN Ray Church III, MD      . baclofen (LIORESAL) tablet 40 mg  40 mg Oral TID Ray Church III, MD   40 mg at 12/30/17 1743  . ceFEPIme (MAXIPIME) 1 g in sodium chloride 0.9 % 100 mL IVPB  1 g Intravenous Q8H [name], MD   Stopped at 12/30/17 1744  . dextrose 5 %-0.45 % sodium chloride infusion   Intravenous Continuous Dancy, Amanda, PA-C      . diazepam (VALIUM) tablet 5 mg  5 mg Oral TID Ray Church III, MD   5 mg at 12/30/17 1743  . diphenhydrAMINE (BENADRYL) injection 12.5-25 mg  12.5-25 mg Intravenous Q6H PRN Ray Church III, MD       Or  . diphenhydrAMINE (BENADRYL) 12.5 MG/5ML elixir 12.5-25 mg  12.5-25 mg Oral Q6H PRN Ray Church III, MD      . docusate sodium (COLACE) capsule 250 mg  250 mg Oral Daily Ray Church III, MD   250 mg at 12/30/17 1040  . enoxaparin (LOVENOX) injection 40 mg  40 mg Subcutaneous Q24H Ray Church III, MD   40 mg at 12/30/17 0815  . levothyroxine (SYNTHROID, LEVOTHROID) tablet 125 mcg  125 mcg Oral QAC breakfast Ray Church III, MD   125 mcg at 12/30/17 [phone]  . morphine 4 MG/ML injection 2-4 mg  2-4 mg Intravenous Q2H PRN Ray Church III, MD      . ondansetron  (ZOFRAN) injection 4 mg  4 mg Intravenous Q4H PRN Ray Church III, MD      . opium-belladonna (B&O SUPPRETTES) 16.2-60 MG suppository 1 suppository  1 suppository Rectal Q6H PRN Ray Church III, MD      . oxybutynin (DITROPAN) tablet 5 mg  5 mg Oral Q8H PRN Ray Church III, MD      . oxyCODONE (Oxy IR/ROXICODONE) immediate release tablet 5 mg  5 mg Oral Q4H PRN Ray Church III, MD      . pantoprazole (PROTONIX) EC tablet 40 mg  40 mg Oral Daily Ray Church III, MD   40 mg at 12/30/17 1039  . perphenazine (TRILAFON) tablet 2 mg  2 mg Oral BID Ray Church III, MD   2 mg at 12/30/17 1040  . scopolamine (TRANSDERM-SCOP) 1 MG/3DAYS 1.5 mg  1 patch Transdermal Q72H Ray Church III, MD   1.5 mg at 12/30/17 1041  . tamsulosin (FLOMAX) capsule 0.4 mg  0.4 mg Oral Daily Ray Church III, MD   0.4 mg at 12/30/17 1042  . tiZANidine (ZANAFLEX) tablet 8 mg  8 mg Oral TID [name], MD  8 mg at 12/30/17 1743  . traMADol (ULTRAM) tablet 50-100 mg  50-100 mg Oral Q6H PRN Ray Church III, MD      . traZODone (DESYREL) tablet 300 mg  300 mg Oral QHS Ray Church III, MD      . vancomycin (VANCOCIN) IVPB 1000 mg/200 mL premix  1,000 mg Intravenous Q12H Shade, Christine E, RPH 200 mL/hr at 12/30/17 1744 1,000 mg at 12/30/17 1744  . venlafaxine XR (EFFEXOR-XR) 24 hr capsule 150 mg  150 mg Oral Q breakfast Ray Church III, MD   150 mg at 12/30/17 1308  . [START ON 01/02/2018] Vitamin D (Ergocalciferol) (DRISDOL) capsule 50,000 Units  50,000 Units Oral Q7 days Ray Church III, MD      . zolpidem (AMBIEN) tablet 5 mg  5 mg Oral QHS PRN,MR X 1 Ray Church III, MD         Objective: Vital: Vitals:   12/30/17 0700 12/30/17 0800 12/30/17 1200 12/30/17 1600  BP: 122/69 106/69    Pulse:      Resp: (!) 30 20    Temp:  99 F (37.2 C) 98.3 F (36.8 C) 99.7 F (37.6 C)  TempSrc:  Oral Oral Oral  SpO2: 96% 98%    Weight:      Height:       I/Os: I/O last 3 completed  shifts: In: 2235.4 [I.V.:2135.4; IV Piggyback:100] Out: 2000 [Urine:2000]  Physical Exam:  General: Patient is in no apparent distress Lungs: Normal respiratory effort, chest expands symmetrically. On RA GI: The abdomen is soft and nontender without mass. SPT draining bright red urine Ext: lower extremities symmetric  Lab Results: Recent Labs    12/29/17 1506  WBC 11.8*  HGB 16.6  HCT 50.7   Recent Labs    12/29/17 1506  NA 136  K 4.4  CL 101  CO2 19*  GLUCOSE 154*  BUN 13  CREATININE 0.87  CALCIUM 9.3   No results for input(s): LABPT, INR in the last 72 hours. No results for input(s): LABURIN in the last 72 hours. Results for orders placed or performed during the hospital encounter of 12/29/17  MRSA PCR Screening     Status: None   Collection Time: 12/29/17  4:03 PM  Result Value Ref Range Status   MRSA by PCR NEGATIVE NEGATIVE Final    Comment:        The GeneXpert MRSA Assay (FDA approved for NASAL specimens only), is one component of a comprehensive MRSA colonization surveillance program. It is not intended to diagnose MRSA infection nor to guide or monitor treatment for MRSA infections. Performed at [facility], [address] [place], [place] [address]   Culture, blood (routine x 2)     Status: None (Preliminary result)   Collection Time: 12/29/17  4:25 PM  Result Value Ref Range Status   Specimen Description   Final    BLOOD RIGHT HAND Performed at [facility], [address], [place], [place] [address]    Special Requests   Final    BOTTLES DRAWN AEROBIC ONLY Blood Culture adequate volume   Culture   Final    NO GROWTH < 24 HOURS Performed at [facility], [address] [place], [place] [address]    Report Status PENDING  Incomplete  Culture, blood (routine x 2)     Status: None (Preliminary result)   Collection Time: 12/29/17  4:25 PM  Result Value Ref  Range Status   Specimen Description    Final    BLOOD LEFT HAND Performed at [facility], [address] [place], [place] [address]    Special Requests   Final    BOTTLES DRAWN AEROBIC ONLY Blood Culture results may not be optimal due to an inadequate volume of blood received in culture bottles   Culture   Final    NO GROWTH < 24 HOURS Performed at [facility], [address], [place], [place] [address]    Report Status PENDING  Incomplete    Studies/Results: X-ray Chest Pa Or Ap  Result Date: 12/29/2017 CLINICAL DATA:  Increasing shortness of breath and tachycardia after cystoscopy. EXAM: CHEST  1 VIEW COMPARISON:  Chest x-ray dated December 09, 2017. FINDINGS: The patient is rotated to the right. The heart size and mediastinal contours are within normal limits. Normal pulmonary vascularity. New airspace disease at the left lung base. No pleural effusion or pneumothorax. No acute osseous abnormality. IMPRESSION: 1. New airspace disease at the left lung base, suspicious for aspiration given clinical history. Electronically Signed   By: Obie Dredge M.D.   On: 12/29/2017 14:32   Dg C-arm 1-60 Min-no Report  Result Date: 12/29/2017 Fluoroscopy was utilized by the requesting physician.  No radiographic interpretation.    Assessment: Aspiration PNA ? Sepsis 2/2 to UTI from ureteroscopy, improving  Procedure(s): CYSTOSCOPY WITH RIGHT  RETROGRADE PYELOGRAM, URETEROSCOPY/LASER LITHOTRIPSY AND STENT PLACEMENT TRANSURETHRAL RESECTION OF BLADDER TUMOR, 1 Day Post-Op  doing well.  Plan: Agree with transfer to floor, f/u cultures. If negative will switch to PO abx x 7 days.   Modena Slater, MD Urology 12/30/2017, 6:47 PM

## 2017-12-31 ENCOUNTER — Other Ambulatory Visit: Payer: Self-pay

## 2017-12-31 ENCOUNTER — Inpatient Hospital Stay (HOSPITAL_COMMUNITY): Payer: Medicare Other

## 2017-12-31 DIAGNOSIS — T17908A Unspecified foreign body in respiratory tract, part unspecified causing other injury, initial encounter: Secondary | ICD-10-CM

## 2017-12-31 LAB — BASIC METABOLIC PANEL
Anion gap: 11 (ref 5–15)
BUN: 9 mg/dL (ref 6–20)
CO2: 21 mmol/L — ABNORMAL LOW (ref 22–32)
Calcium: 8.2 mg/dL — ABNORMAL LOW (ref 8.9–10.3)
Chloride: 106 mmol/L (ref 101–111)
Creatinine, Ser: 0.62 mg/dL (ref 0.61–1.24)
GFR calc Af Amer: >60 mL/min (ref >60)
GFR calc non Af Amer: >60 mL/min (ref >60)
Glucose, Bld: 143 mg/dL — ABNORMAL HIGH (ref 65–99)
Potassium: 4.2 mmol/L (ref 3.5–5.1)
Sodium: 138 mmol/L (ref 135–145)

## 2017-12-31 LAB — PHOSPHORUS: Phosphorus: 1.9 mg/dL — ABNORMAL LOW (ref 2.5–4.6)

## 2017-12-31 LAB — MAGNESIUM: Magnesium: 1.6 mg/dL — ABNORMAL LOW (ref 1.7–2.4)

## 2017-12-31 MED ORDER — SODIUM CHLORIDE 0.9 % IV BOLUS
1000.0000 mL | Freq: Once | INTRAVENOUS | Status: AC
  Administered 2017-12-31: 1000 mL via INTRAVENOUS

## 2017-12-31 MED ORDER — CIPROFLOXACIN HCL 500 MG PO TABS
500.0000 mg | ORAL_TABLET | Freq: Two times a day (BID) | ORAL | Status: DC
  Administered 2017-12-31 – 2018-01-01 (×3): 500 mg via ORAL
  Filled 2017-12-31 (×3): qty 1

## 2017-12-31 NOTE — Progress Notes (Signed)
Patient transferred form ICU at this time to room. Safety precautions and orders reviewed. VSS.  Pt declined any distress. Will continue to monitor.   Sim Boast, RN

## 2017-12-31 NOTE — Progress Notes (Signed)
Urology Inpatient Progress Report  Ureteral stone [N20.1]  Procedure(s): CYSTOSCOPY WITH RIGHT  RETROGRADE PYELOGRAM, URETEROSCOPY/LASER LITHOTRIPSY AND STENT PLACEMENT TRANSURETHRAL RESECTION OF BLADDER TUMOR  2 Days Post-Op   Intv/Subj: No acute events overnight. Patient is without complaint.  He feels much improved.  He has been afebrile overnight.  He has been somewhat hypotensive however given his history of spinal cord injury, this may be normal for him.  Respiratory status is much improved.  Urine is clearing and is now light pink.  Pathology from his bladder mass returned and was consistent with cystitis/inflammatory change.  No evidence of malignancy.  Heart rate has normalized for the most part but he is intermittently tachycardic to the low 100s.  Blood cultures negative to date.  Urine culture with 50,000 colonies of gram-negative rods.  Active Problems:   Ureteral calculus   Ureteral stone  Current Facility-Administered Medications  Medication Dose Route Frequency Provider Last Rate Last Dose  . 0.9 %  sodium chloride infusion   Intravenous Continuous Oretha Milch, MD 50 mL/hr at 12/30/17 1045    . acetaminophen (TYLENOL) suppository 650 mg  650 mg Rectal Q4H PRN Oretha Milch, MD      . acetaminophen (TYLENOL) tablet 650 mg  650 mg Oral Q4H PRN Ray Church III, MD      . baclofen (LIORESAL) tablet 40 mg  40 mg Oral TID Ray Church III, MD   40 mg at 12/30/17 2229  . ceFEPIme (MAXIPIME) 1 g in sodium chloride 0.9 % 100 mL IVPB  1 g Intravenous Q8H [name], MD   Stopped at 12/31/17 [phone]  . dextrose 5 %-0.45 % sodium chloride infusion   Intravenous Continuous Dancy, Amanda, PA-C      . diazepam (VALIUM) tablet 5 mg  5 mg Oral TID Ray Church III, MD   5 mg at 12/30/17 2229  . diphenhydrAMINE (BENADRYL) injection 12.5-25 mg  12.5-25 mg Intravenous Q6H PRN Ray Church III, MD       Or  . diphenhydrAMINE (BENADRYL) 12.5 MG/5ML elixir 12.5-25 mg  12.5-25  mg Oral Q6H PRN Ray Church III, MD      . docusate sodium (COLACE) capsule 250 mg  250 mg Oral Daily Ray Church III, MD   250 mg at 12/30/17 1040  . enoxaparin (LOVENOX) injection 40 mg  40 mg Subcutaneous Q24H Ray Church III, MD   40 mg at 12/31/17 0817  . levothyroxine (SYNTHROID, LEVOTHROID) tablet 125 mcg  125 mcg Oral QAC breakfast Ray Church III, MD   125 mcg at 12/31/17 [phone]  . morphine 4 MG/ML injection 2-4 mg  2-4 mg Intravenous Q2H PRN Ray Church III, MD      . ondansetron (ZOFRAN) injection 4 mg  4 mg Intravenous Q4H PRN Ray Church III, MD      . opium-belladonna (B&O SUPPRETTES) 16.2-60 MG suppository 1 suppository  1 suppository Rectal Q6H PRN Ray Church III, MD      . oxybutynin (DITROPAN) tablet 5 mg  5 mg Oral Q8H PRN Ray Church III, MD      . oxyCODONE (Oxy IR/ROXICODONE) immediate release tablet 5 mg  5 mg Oral Q4H PRN Ray Church III, MD      . pantoprazole (PROTONIX) EC tablet 40 mg  40 mg Oral Daily Ray Church III, MD   40 mg at 12/30/17 1039  . perphenazine (TRILAFON) tablet 2  mg  2 mg Oral BID Ray Church III, MD   2 mg at 12/30/17 2229  . scopolamine (TRANSDERM-SCOP) 1 MG/3DAYS 1.5 mg  1 patch Transdermal Q72H Ray Church III, MD   1.5 mg at 12/30/17 1041  . tamsulosin (FLOMAX) capsule 0.4 mg  0.4 mg Oral Daily Ray Church III, MD   0.4 mg at 12/30/17 1042  . tiZANidine (ZANAFLEX) tablet 8 mg  8 mg Oral TID Ray Church III, MD   8 mg at 12/30/17 2228  . traMADol (ULTRAM) tablet 50-100 mg  50-100 mg Oral Q6H PRN Ray Church III, MD   50 mg at 12/30/17 2229  . traZODone (DESYREL) tablet 300 mg  300 mg Oral QHS Ray Church III, MD   300 mg at 12/30/17 2229  . vancomycin (VANCOCIN) IVPB 1000 mg/200 mL premix  1,000 mg Intravenous Q12H Winfield Rast, RPH   Stopped at 12/31/17 0102  . venlafaxine XR (EFFEXOR-XR) 24 hr capsule 150 mg  150 mg Oral Q breakfast Ray Church III, MD   150 mg at 12/31/17 0818  . [START ON  01/02/2018] Vitamin D (Ergocalciferol) (DRISDOL) capsule 50,000 Units  50,000 Units Oral Q7 days Ray Church III, MD      . zolpidem (AMBIEN) tablet 5 mg  5 mg Oral QHS PRN,MR X 1 Ray Church III, MD         Objective: Vital: Vitals:   12/31/17 0500 12/31/17 0600 12/31/17 0700 12/31/17 0800  BP: (!) 100/51 92/72 (!) 95/47   Pulse:      Resp: 14 11 14  (!) 24  Temp:      TempSrc:      SpO2: 99% 98% 100% 100%  Weight:      Height:       I/Os: I/O last 3 completed shifts: In: 3625 [I.V.:2925; IV Piggyback:700] Out: 3000 [Urine:3000]  Physical Exam:  General: Patient is in no apparent distress Lungs: Normal respiratory effort, chest expands symmetrically. GI: The abdomen is soft and nontender without mass. SP tube: Draining light pink urine Gu: Strain from the ureteral stent is in place on the penis Ext: lower extremities symmetric  Lab Results: Recent Labs    12/29/17 1506  WBC 11.8*  HGB 16.6  HCT 50.7   Recent Labs    12/29/17 1506  NA 136  K 4.4  CL 101  CO2 19*  GLUCOSE 154*  BUN 13  CREATININE 0.87  CALCIUM 9.3   No results for input(s): LABPT, INR in the last 72 hours. No results for input(s): LABURIN in the last 72 hours. Results for orders placed or performed during the hospital encounter of 12/29/17  Culture, Urine     Status: Abnormal (Preliminary result)   Collection Time: 12/29/17  4:01 PM  Result Value Ref Range Status   Specimen Description   Final    URINE, SUPRAPUBIC Performed at [facility], [address], [place], [place] [address]    Special Requests   Final    NONE Performed at [facility], [address], [place], [place] [address]    Culture 50,000 COLONIES/mL GRAM NEGATIVE RODS (A)  Final   Report Status PENDING  Incomplete  MRSA PCR Screening     Status: None   Collection Time: 12/29/17  4:03 PM  Result Value Ref Range Status   MRSA by PCR NEGATIVE NEGATIVE Final    Comment:  The GeneXpert MRSA Assay (FDA approved for NASAL specimens only), is one component of a comprehensive MRSA colonization surveillance program. It is not intended to diagnose MRSA infection nor to guide or monitor treatment for MRSA infections. Performed at [facility], [address], [place], [place] [address]   Culture, blood (routine x 2)     Status: None (Preliminary result)   Collection Time: 12/29/17  4:25 PM  Result Value Ref Range Status   Specimen Description   Final    BLOOD RIGHT HAND Performed at [facility], [address] [place], [place] [address]    Special Requests   Final    BOTTLES DRAWN AEROBIC ONLY Blood Culture adequate volume   Culture   Final    NO GROWTH < 24 HOURS Performed at [facility], [address] [place], [place] [address]    Report Status PENDING  Incomplete  Culture, blood (routine x 2)     Status: None (Preliminary result)   Collection Time: 12/29/17  4:25 PM  Result Value Ref Range Status   Specimen Description   Final    BLOOD LEFT HAND Performed at [facility], [address], [place], [place] [address]    Special Requests   Final    BOTTLES DRAWN AEROBIC ONLY Blood Culture results may not be optimal due to an inadequate volume of blood received in culture bottles   Culture   Final    NO GROWTH < 24 HOURS Performed at [facility], [address] [place], [place] [address]    Report Status PENDING  Incomplete    Studies/Results: X-ray Chest Pa Or Ap  Result Date: 12/29/2017 CLINICAL DATA:  Increasing shortness of breath and tachycardia after cystoscopy. EXAM: CHEST  1 VIEW COMPARISON:  Chest x-ray dated December 09, 2017. FINDINGS: The patient is rotated to the right. The heart size and mediastinal contours are within normal limits. Normal pulmonary vascularity. New airspace disease at the left lung base. No pleural effusion or pneumothorax. No acute osseous  abnormality. IMPRESSION: 1. New airspace disease at the left lung base, suspicious for aspiration given clinical history. Electronically Signed   By: Obie Dredge M.D.   On: 12/29/2017 14:32   Dg C-arm 1-60 Min-no Report  Result Date: 12/29/2017 Fluoroscopy was utilized by the requesting physician.  No radiographic interpretation.    Assessment: Right ureteral calculus Postoperative sepsis/transient bacteremia likely secondary to instrumentation during ureteroscopy  Procedure(s): CYSTOSCOPY WITH RIGHT  RETROGRADE PYELOGRAM, URETEROSCOPY/LASER LITHOTRIPSY AND STENT PLACEMENT TRANSURETHRAL RESECTION OF BLADDER TUMOR, 2 Days Post-Op  doing well.  Plan: Agree with current antibiotics.  We will await final blood culture and urine culture.  Can consider transitioning to oral antibiotics considering he has been afebrile.  During the last admission, he was transitioned to and he was discharged home on oral cephalexin thousand milligrams 3 times daily.  Can consider transitioning to this.  Okay from my standpoint to transfer to the floor when the ICU deems it appropriate.  He will keep the stent for about 1 week.   Modena Slater, MD Urology 12/31/2017, 8:34 AM

## 2017-12-31 NOTE — Plan of Care (Signed)

## 2017-12-31 NOTE — Progress Notes (Signed)
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  He was also found to have a right ureteropelvic junction calculus and underwent emergent stent placement.  He had been evaluated earlier in the urology office with cystoscopy and felt to have possible bladder tumor. On 4/29 he underwent elective cystoscopy with right ureteroscopy and stent exchange and transurethral resection of bladder mass on the posterior bladder wall.  Renal calculi was removed, postoperatively in the PACU he developed respiratory distress and was found to have a left lower lobe pneumonia on chest x-ray. He was placed on BiPAP and transferred to PICU and PCCM was consulted.  He was noted to be febrile to 103  SIGNIFICANT EVENTS    STUDIES:  CT abdomen 4/10 >> Mild prominence of the right intrarenal collecting system. There are right intrarenal stones, largest a renal pelvis stone measuring 11 mm.    SUBJECTIVE:  Continues to improve, On [place], no distress afebrile   VITAL SIGNS: Temp:  [97.6 F (36.4 C)-99.7 F (37.6 C)] 98 F (36.7 C) (05/01 0800) Resp:  [11-36] 24 (05/01 0800) BP: (70-127)/(40-72) 95/47 (05/01 0700) SpO2:  [89 %-100 %] 100 % (05/01 0800)  PHYSICAL EXAMINATION: General: Chronically ill-appearing, thin frail man on [place] Neuro: Awake and interactive,  Power 3/5 in LUE, 1/5 in all other extremities HEENT: No pallor, icterus or JVD Cardiovascular: S1-S2 normal, no murmur Lungs: Decreased breath sounds left base, good air entry in the right Abdomen: Soft, nontender, bloody  urine Musculoskeletal: Contractures both lower extremity and foot drop Skin: No rash  Recent Labs  Lab 12/29/17 1506 12/31/17 0809  NA 136 138  K 4.4 4.2  CL 101 106  CO2 19* 21*  BUN 13 9  CREATININE 0.87 0.62  GLUCOSE 154* 143*   Recent Labs  Lab 12/29/17 1506  HGB 16.6  HCT 50.7  WBC 11.8*  PLT 329   X-ray Chest Pa Or Ap  Result Date: 12/29/2017 CLINICAL DATA:  Increasing shortness of breath and tachycardia after cystoscopy. EXAM: CHEST  1 VIEW COMPARISON:  Chest x-ray dated December 09, 2017. FINDINGS: The patient is rotated to the right. The heart size and mediastinal contours are within normal limits. Normal pulmonary vascularity. New airspace disease at the left lung base. No pleural effusion or pneumothorax. No acute osseous abnormality. IMPRESSION: 1. New airspace disease at the left lung base, suspicious for aspiration given clinical history. Electronically Signed   By: Samuel [name] M.D.   On: 12/29/2017 14:32   Dg Chest Port 1 View  Result Date: 12/31/2017 CLINICAL DATA:  Respiratory failure, spastic hemiplegia, multiple scleroses, possible aspiration pneumonia. Urinary tract stones. EXAM: PORTABLE CHEST 1 VIEW COMPARISON:  Chest x-ray of December 29, 2017 FINDINGS: The lungs are reasonably well inflated. Density at the left lung base is less conspicuous today suggesting that it may have been indicated transient atelectasis. There is no discrete infiltrate. The heart is top-normal in size. The pulmonary vascularity is not engorged. IMPRESSION: Improved appearance of the left lung base consistent with resolving atelectasis. No acute pneumonia nor CHF. Electronically Signed   By: Samuel  [name] M.D.   On:  12/31/2017 09:48   Dg C-arm 1-60 Min-no Report  Result Date: 12/29/2017 Fluoroscopy was utilized by the requesting physician.  No radiographic interpretation.    ASSESSMENT / PLAN:  Acute respiratory failure, Postop -due to aspiration pneumonia, left lower lobe    -resolved, CXR improved -taper off O2  Sepsis due to UTI and instrumentation -recent history of Citrobacter sepsis pansensitive and enterococcal UTI, allergic to penicillins and imipenem noted  -dc cefepime &  vancomycin since MRSA pcr neg -switch to oral  cipro 500 bid x 5ds -drop IV normal saline at 50 an hour. - can resume lisinopril and thiazide on discharge  Transfer to floor,  Discharge per urology PCCM available     Samuel Mourning MD. [facility]. [place] [facility] Pager [phone] If no response call [phone]     12/31/2017, 10:10 AM

## 2017-12-31 NOTE — Progress Notes (Addendum)
eLink Physician-Brief Progress Note Patient Name: Samuel [name] DOB: [birth year] MRN: [id number]   Date of Service  12/31/2017  HPI/Events of Note  Hypotension - BP = 77/41. LVF = 60%-65%  eICU Interventions  Will order: 1. Bolus with 0.9 NaCl 1 liter IV over 1 hour now.      Intervention Category Major Interventions: Hypotension - evaluation and management  Sommer,Steven Eugene 12/31/2017, 2:27 AM

## 2018-01-01 LAB — URINE CULTURE: Culture: 50000 — AB

## 2018-01-01 NOTE — Discharge Summary (Signed)
Physician Discharge Summary  Patient ID: Samuel [name] MRN: [id number] DOB/AGE: [birth year] 57 y.o.  Admit date: 12/29/2017 Discharge date: 01/01/2018  Admission Diagnoses:  Discharge Diagnoses:  Active Problems:   Ureteral calculus   Ureteral stone   Discharged Condition: good  Hospital Course: status post right ureteroscopy with laser lithotripsy and ureteral stent placement.  Postoperatively, he had respiratory distress and was febrile.  He was transferred to the ICU and started on broad-spectrum antibiotics.  Respiratory status improved as did all other parameters.  He was transitioned over to ciprofloxacin and did well on this for 1 day.  He was therefore discharged on ciprofloxacin for 7 days.  Consults: ICU  Significant Diagnostic Studies: none  Treatments: surgery: right ureteral stent placement  Discharge Exam: Blood pressure 112/73, pulse 63, temperature 98.5 F (36.9 C), temperature source Oral, resp. rate 18, height 6\' 1"  (1.854 m), weight 72.8 kg (160 lb 7.9 oz), SpO2 99 %. General appearance: alertno acute distress Normal temperature, adequate perfusion of extremities Nonlabored respirations, symmetrical chest rise on room air Suprapubic catheter draining clear yellow urine.  Hematuria resolved. Contracted upper and lower extremities.  Disposition: Discharge disposition: 01-Home or Self Care        Allergies as of 01/01/2018      Reactions   Penicillins Other (See Comments)   Unknown childhoood reaction (takes amoxicillian with reaction) Has tolerated ceftriaxone, cefepime, and Primaxin during inpatient admissions   Primaxin [imipenem] Other (See Comments)   Seizure       Medication List    TAKE these medications   acetic acid 0.25 % irrigation Irrigate with as directed daily. Instill 67ml and clamp tube for 30 minutes then drain.   baclofen 20 MG tablet Commonly known as:  LIORESAL TAKE TWO TABLETS BY MOUTH THREE TIMES DAILY   ciprofloxacin 500  MG tablet Commonly known as:  CIPRO Take 1 tablet (500 mg total) by mouth 2 (two) times daily for 7 days.   diazepam 5 MG tablet Commonly known as:  VALIUM Take 1 tablet (5 mg total) by mouth 3 (three) times daily.   docusate sodium 250 MG capsule Commonly known as:  COLACE Take 250 mg by mouth daily.   HYDROcodone-acetaminophen 5-325 MG tablet Commonly known as:  NORCO/VICODIN Take 1 tablet by mouth every 6 (six) hours as needed.   levothyroxine 125 MCG tablet Commonly known as:  SYNTHROID, LEVOTHROID Take 125 mcg by mouth daily.   lisinopril-hydrochlorothiazide 10-12.5 MG tablet Commonly known as:  PRINZIDE,ZESTORETIC Take 0.5 tablets by mouth every morning.   omeprazole 20 MG capsule Commonly known as:  PRILOSEC Take 20 mg by mouth every morning.   perphenazine 2 MG tablet Commonly known as:  TRILAFON TAKE 1 TABLET BY MOUTH TWICE DAILY   scopolamine 1 MG/3DAYS Commonly known as:  TRANSDERM-SCOP (1.5 MG) Place 1 patch (1.5 mg total) onto the skin every 3 (three) days.   tamsulosin 0.4 MG Caps capsule Commonly known as:  FLOMAX Take 1 capsule (0.4 mg total) by mouth daily.   tiZANidine 4 MG tablet Commonly known as:  ZANAFLEX Take 2 tablets (8 mg total) by mouth 3 (three) times daily. Please call [phone] to schedule an appt.   traMADol 50 MG tablet Commonly known as:  ULTRAM Take 1 tablet (50 mg total) by mouth every 6 (six) hours as needed.   trazodone 300 MG tablet Commonly known as:  DESYREL TAKE 1 TABLET BY MOUTH ONCE DAILY AT BEDTIME   venlafaxine XR 75 MG 24 hr  capsule Commonly known as:  EFFEXOR-XR TAKE 2 CAPSULES BY MOUTH ONCE DAILY WITH BREAKFAST   Vitamin D (Ergocalciferol) 50000 units Caps capsule Commonly known as:  DRISDOL Take 50,000 Units by mouth every 7 (seven) days. Friday only        Signed: Ray [name], Samuel [name] 01/01/2018, 5:15 PM

## 2018-01-01 NOTE — Progress Notes (Signed)
Discharge instructions reviewed with patient/family. All questions answered at this time. RN instructed pt to call MD office for future question arises. RX given. Pt transport by [facility].  Sim Boast, RN

## 2018-01-01 NOTE — Care Management Note (Signed)
Case Management Note  Patient Details  Name: Samuel [name] MRN: [id number] Date of Birth: 06-Sep-1960  Subjective/Objective:  D/C home by [facility]-forms on shadow chart for nsg to call when ready. No further CM needs.                  Action/Plan:d/c home by [facility].   Expected Discharge Date:  01/01/18               Expected Discharge Plan:  Home/Self Care  In-House Referral:     Discharge planning Services  CM Consult  Post Acute Care Choice:    Choice offered to:     DME Arranged:    DME Agency:     HH Arranged:    HH Agency:     Status of Service:  Completed, signed off  If discussed at [employer] of Stay Meetings, dates discussed:    Additional Comments:  Lanier Clam, RN 01/01/2018, 1:38 PM

## 2018-01-01 NOTE — Progress Notes (Signed)
Patient with discharge order. CM notified for transport home per [facility] per pt/family request.   Sim Boast, RN

## 2018-01-03 LAB — CULTURE, BLOOD (ROUTINE X 2)
Culture: NO GROWTH
Culture: NO GROWTH
Special Requests: ADEQUATE

## 2018-01-07 ENCOUNTER — Other Ambulatory Visit: Payer: Self-pay | Admitting: Neurology

## 2018-02-18 ENCOUNTER — Other Ambulatory Visit: Payer: Self-pay | Admitting: Neurology

## 2018-02-18 NOTE — Telephone Encounter (Signed)
[name] returning RNs call, advised her what the messages stated and offered appt time. [name] stated that it takes 4 people to get the pt here and is unable to take last minute appts. Requesting a call back to discuss why the lab work from the pts hospital visit cannot be used so he doesn't have to come in.

## 2018-02-18 NOTE — Telephone Encounter (Signed)
Called sister. Scheduled pt for 05/19/18 at 830am. Advised refills sent in. She verbalized understanding and appreciation.

## 2018-02-18 NOTE — Telephone Encounter (Signed)
Called and LVM for Audry Riles (sister) who has POA to call office.   We received refill requests for tramadol and venlafaxine however, we need to get pt scheduled to see Dr. Anne Hahn for a f/u. He is overdue for his 6 month f/u. Can offer 02/20/18 at 12pm, check in 11:30am if she calls.

## 2018-02-18 NOTE — Telephone Encounter (Signed)
Per Dr. Anne Hahn- ok for pt to follow up around September or October

## 2018-03-09 ENCOUNTER — Telehealth: Payer: Self-pay | Admitting: *Deleted

## 2018-03-09 NOTE — Telephone Encounter (Signed)
Tried calling Samuel [name] (POA). Received notice from [facility] patient assistance foundation  (for ocrevus) that they could not reach pt/family to get updated insurance info/financial info. They have to get this every 3 months to determine ongoing eligibility. They need to contact them back at [phone].  Please relay message above if they call, thanks

## 2018-03-12 NOTE — Telephone Encounter (Signed)
Called Samuel [name] again since no return call. She states her VM has not been working for last month. I relayed information below. She will call them to give them updated information. Nothing further needed.

## 2018-04-02 ENCOUNTER — Other Ambulatory Visit: Payer: Self-pay | Admitting: Neurology

## 2018-04-03 ENCOUNTER — Other Ambulatory Visit: Payer: Self-pay | Admitting: Neurology

## 2018-04-18 ENCOUNTER — Other Ambulatory Visit: Payer: Self-pay | Admitting: Neurology

## 2018-04-20 ENCOUNTER — Inpatient Hospital Stay (HOSPITAL_COMMUNITY)
Admission: EM | Admit: 2018-04-20 | Discharge: 2018-04-23 | DRG: 698 | Disposition: A | Discharge status: Home / Self Care | Payer: Medicare Other | Attending: Family Medicine | Admitting: Family Medicine

## 2018-04-20 ENCOUNTER — Emergency Department (HOSPITAL_COMMUNITY): Payer: Medicare Other

## 2018-04-20 ENCOUNTER — Inpatient Hospital Stay (HOSPITAL_COMMUNITY): Payer: Medicare Other

## 2018-04-20 DIAGNOSIS — N3001 Acute cystitis with hematuria: Secondary | ICD-10-CM

## 2018-04-20 DIAGNOSIS — R5382 Chronic fatigue, unspecified: Secondary | ICD-10-CM | POA: Diagnosis present

## 2018-04-20 DIAGNOSIS — E785 Hyperlipidemia, unspecified: Secondary | ICD-10-CM | POA: Diagnosis present

## 2018-04-20 DIAGNOSIS — A419 Sepsis, unspecified organism: Secondary | ICD-10-CM | POA: Diagnosis present

## 2018-04-20 DIAGNOSIS — Z6820 Body mass index (BMI) 20.0-20.9, adult: Secondary | ICD-10-CM | POA: Diagnosis not present

## 2018-04-20 DIAGNOSIS — E039 Hypothyroidism, unspecified: Secondary | ICD-10-CM | POA: Diagnosis present

## 2018-04-20 DIAGNOSIS — T83518A Infection and inflammatory reaction due to other urinary catheter, initial encounter: Secondary | ICD-10-CM | POA: Diagnosis not present

## 2018-04-20 DIAGNOSIS — K219 Gastro-esophageal reflux disease without esophagitis: Secondary | ICD-10-CM | POA: Diagnosis present

## 2018-04-20 DIAGNOSIS — N319 Neuromuscular dysfunction of bladder, unspecified: Secondary | ICD-10-CM | POA: Diagnosis present

## 2018-04-20 DIAGNOSIS — F419 Anxiety disorder, unspecified: Secondary | ICD-10-CM | POA: Diagnosis present

## 2018-04-20 DIAGNOSIS — G8114 Spastic hemiplegia affecting left nondominant side: Secondary | ICD-10-CM | POA: Diagnosis present

## 2018-04-20 DIAGNOSIS — Z881 Allergy status to other antibiotic agents status: Secondary | ICD-10-CM

## 2018-04-20 DIAGNOSIS — N390 Urinary tract infection, site not specified: Secondary | ICD-10-CM | POA: Diagnosis present

## 2018-04-20 DIAGNOSIS — Z79890 Hormone replacement therapy (postmenopausal): Secondary | ICD-10-CM

## 2018-04-20 DIAGNOSIS — Z87440 Personal history of urinary (tract) infections: Secondary | ICD-10-CM

## 2018-04-20 DIAGNOSIS — G8929 Other chronic pain: Secondary | ICD-10-CM | POA: Diagnosis present

## 2018-04-20 DIAGNOSIS — L89322 Pressure ulcer of left buttock, stage 2: Secondary | ICD-10-CM | POA: Diagnosis present

## 2018-04-20 DIAGNOSIS — K592 Neurogenic bowel, not elsewhere classified: Secondary | ICD-10-CM | POA: Diagnosis present

## 2018-04-20 DIAGNOSIS — Z8619 Personal history of other infectious and parasitic diseases: Secondary | ICD-10-CM | POA: Diagnosis not present

## 2018-04-20 DIAGNOSIS — E440 Moderate protein-calorie malnutrition: Secondary | ICD-10-CM | POA: Diagnosis present

## 2018-04-20 DIAGNOSIS — I10 Essential (primary) hypertension: Secondary | ICD-10-CM | POA: Diagnosis present

## 2018-04-20 DIAGNOSIS — Z87891 Personal history of nicotine dependence: Secondary | ICD-10-CM

## 2018-04-20 DIAGNOSIS — Z7401 Bed confinement status: Secondary | ICD-10-CM | POA: Diagnosis not present

## 2018-04-20 DIAGNOSIS — Z87442 Personal history of urinary calculi: Secondary | ICD-10-CM | POA: Diagnosis not present

## 2018-04-20 DIAGNOSIS — E559 Vitamin D deficiency, unspecified: Secondary | ICD-10-CM | POA: Diagnosis present

## 2018-04-20 DIAGNOSIS — G8250 Quadriplegia, unspecified: Secondary | ICD-10-CM | POA: Diagnosis present

## 2018-04-20 DIAGNOSIS — Z8701 Personal history of pneumonia (recurrent): Secondary | ICD-10-CM

## 2018-04-20 DIAGNOSIS — F329 Major depressive disorder, single episode, unspecified: Secondary | ICD-10-CM | POA: Diagnosis present

## 2018-04-20 DIAGNOSIS — G35 Multiple sclerosis: Secondary | ICD-10-CM | POA: Diagnosis present

## 2018-04-20 DIAGNOSIS — Z79899 Other long term (current) drug therapy: Secondary | ICD-10-CM

## 2018-04-20 DIAGNOSIS — R0902 Hypoxemia: Secondary | ICD-10-CM | POA: Diagnosis present

## 2018-04-20 DIAGNOSIS — Y846 Urinary catheterization as the cause of abnormal reaction of the patient, or of later complication, without mention of misadventure at the time of the procedure: Secondary | ICD-10-CM | POA: Diagnosis present

## 2018-04-20 DIAGNOSIS — G35D Multiple sclerosis, unspecified: Secondary | ICD-10-CM | POA: Diagnosis present

## 2018-04-20 DIAGNOSIS — L8990 Pressure ulcer of unspecified site, unspecified stage: Secondary | ICD-10-CM

## 2018-04-20 DIAGNOSIS — R509 Fever, unspecified: Secondary | ICD-10-CM | POA: Diagnosis not present

## 2018-04-20 DIAGNOSIS — Z880 Allergy status to penicillin: Secondary | ICD-10-CM

## 2018-04-20 LAB — COMPREHENSIVE METABOLIC PANEL
ALT: 30 U/L (ref 0–44)
AST: 21 U/L (ref 15–41)
Albumin: 4.5 g/dL (ref 3.5–5.0)
Alkaline Phosphatase: 145 U/L — ABNORMAL HIGH (ref 38–126)
Anion gap: 12 (ref 5–15)
BUN: 8 mg/dL (ref 6–20)
CO2: 26 mmol/L (ref 22–32)
Calcium: 9.4 mg/dL (ref 8.9–10.3)
Chloride: 103 mmol/L (ref 98–111)
Creatinine, Ser: 0.52 mg/dL — ABNORMAL LOW (ref 0.61–1.24)
GFR calc Af Amer: >60 mL/min (ref >60)
GFR calc non Af Amer: >60 mL/min (ref >60)
Glucose, Bld: 120 mg/dL — ABNORMAL HIGH (ref 70–99)
Potassium: 3.6 mmol/L (ref 3.5–5.1)
Sodium: 141 mmol/L (ref 135–145)
Total Bilirubin: 0.5 mg/dL (ref 0.3–1.2)
Total Protein: 7.6 g/dL (ref 6.5–8.1)

## 2018-04-20 LAB — URINALYSIS, ROUTINE W REFLEX MICROSCOPIC
Bilirubin Urine: NEGATIVE
Glucose, UA: NEGATIVE mg/dL
Ketones, ur: 5 mg/dL — AB
Nitrite: POSITIVE — AB
Protein, ur: 30 mg/dL — AB
Specific Gravity, Urine: 1.009 (ref 1.005–1.030)
WBC, UA: >50 WBC/hpf — ABNORMAL HIGH (ref 0–5)
pH: 6 (ref 5.0–8.0)

## 2018-04-20 LAB — CBC WITH DIFFERENTIAL/PLATELET
Basophils Absolute: 0 10*3/uL (ref 0.0–0.1)
Basophils Relative: 0 %
Eosinophils Absolute: 0 10*3/uL (ref 0.0–0.7)
Eosinophils Relative: 0 %
HCT: 47.4 % (ref 39.0–52.0)
Hemoglobin: 16 g/dL (ref 13.0–17.0)
Lymphocytes Relative: 6 %
Lymphs Abs: 0.9 10*3/uL (ref 0.7–4.0)
MCH: 29.2 pg (ref 26.0–34.0)
MCHC: 33.8 g/dL (ref 30.0–36.0)
MCV: 86.5 fL (ref 78.0–100.0)
Monocytes Absolute: 1 10*3/uL (ref 0.1–1.0)
Monocytes Relative: 7 %
Neutro Abs: 12.5 10*3/uL — ABNORMAL HIGH (ref 1.7–7.7)
Neutrophils Relative %: 87 %
Platelets: 276 10*3/uL (ref 150–400)
RBC: 5.48 MIL/uL (ref 4.22–5.81)
RDW: 12.9 % (ref 11.5–15.5)
WBC: 14.4 10*3/uL — ABNORMAL HIGH (ref 4.0–10.5)

## 2018-04-20 MED ORDER — CIPROFLOXACIN IN D5W 400 MG/200ML IV SOLN: 400.0000 mg | Freq: Once | INTRAVENOUS | Status: DC

## 2018-04-20 MED ORDER — SODIUM CHLORIDE 0.9 % IV SOLN
1.0000 g | Freq: Once | INTRAVENOUS | Status: AC
  Administered 2018-04-20: 1 g via INTRAVENOUS
  Filled 2018-04-20: qty 1

## 2018-04-20 MED ORDER — POTASSIUM CHLORIDE CRYS ER 20 MEQ PO TBCR
40.0000 meq | EXTENDED_RELEASE_TABLET | Freq: Once | ORAL | Status: AC
  Administered 2018-04-21: 40 meq via ORAL
  Filled 2018-04-20: qty 2

## 2018-04-20 NOTE — ED Provider Notes (Signed)
[facility]-EMERGENCY DEPT Provider Note   CSN: [id number] Arrival date & time: 04/20/18  1807     History   Chief Complaint Chief Complaint  Patient presents with  . Shortness of Breath    HPI Samuel [name] is a 57 y.o. male.  Patient complains of fevers and chills.  Patient has a history of MS  The history is provided by the patient. No language interpreter was used.  Illness  This is a recurrent problem. The current episode started 2 days ago. The problem occurs constantly. The problem has not changed since onset.Pertinent negatives include no chest pain, no abdominal pain and no headaches. Nothing aggravates the symptoms. Nothing relieves the symptoms.    Past Medical History:  Diagnosis Date  . Anxiety   . Bladder calculi   . Chronic back pain   . Chronic fatigue   . Depression   . Foley catheter in place   . Gait disorder   . GERD (gastroesophageal reflux disease)   . History of kidney stones   . Hyperlipidemia   . Hypertension   . Hypothyroidism   . Incontinence of urine   . Multiple sclerosis (HCC) NEUROLOGIST-- [name]   dx 1993, JC virus negative 2/14---  CURRENTLY RECEIVING TYSABRI IV TREATMENT  . Neurogenic bladder   . Neurogenic bowel    intermittant constipation and diarrhea  . Pneumonia    dx  03-03-2014--  admitted for iv and oral antibiotics  . 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 Urinary retention   . Vitamin D deficiency   . Yeast infection    genital area    Patient Active Problem List   Diagnosis Date Noted  . Ureteral calculus 12/29/2017  . Ureteral stone 12/29/2017  . Sepsis (HCC) 12/09/2017  . Seizure (HCC)   . Lower urinary tract infectious disease 10/10/2015  . Essential hypertension 10/10/2015  . Nausea & vomiting 10/10/2015  . Malfunction of Foley catheter (HCC)   . Malnutrition of moderate degree (HCC) 07/14/2014  . History of ESBL Klebsiella pneumoniae infection 07/14/2014  . UTI (urinary tract  infection) 07/10/2014  . Neurogenic bladder 03/21/2014  . Acute retention of urine 03/03/2014  . PNA (pneumonia) 03/02/2014  . Pneumonia 03/02/2014  . Multiple sclerosis (HCC) 04/19/2013  . 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 APPENDECTOMY  age 17  . CYSTOSCOPY N/A 03/21/2014   Procedure: CYSTOSCOPY TREATMENT OF BLADDER CALCULI;  Surgeon: [name], MD;  Location: [facility];  Service: Urology;  Laterality: N/A;  . CYSTOSCOPY WITH BIOPSY N/A 12/29/2017   Procedure: TRANSURETHRAL RESECTION OF BLADDER TUMOR;  Surgeon: [name], MD;  Location: [facility];  Service: Urology;  Laterality: N/A;  . CYSTOSCOPY WITH RETROGRADE PYELOGRAM, URETEROSCOPY AND STENT PLACEMENT Right 12/11/2017   Procedure: CYSTOSCOPY WITH RETROGRADE PYELOGRAM, URETEROSCOPY AND STENT PLACEMENT RIGHT;  Surgeon: [name], MD;  Location: [facility];  Service: Urology;  Laterality: Right;  . CYSTOSCOPY WITH RETROGRADE PYELOGRAM, URETEROSCOPY AND STENT PLACEMENT Right 12/29/2017   Procedure: CYSTOSCOPY WITH RIGHT  RETROGRADE PYELOGRAM, URETEROSCOPY/LASER LITHOTRIPSY AND STENT PLACEMENT;  Surgeon: [name], MD;  Location: [facility];  Service: Urology;  Laterality: Right;  . INSERTION OF SUPRAPUBIC CATHETER N/A 03/21/2014   Procedure: INSERTION OF SUPRAPUBIC CATHETER;  Surgeon: [name], MD;  Location: [facility];  Service: Urology;  Laterality: N/A;  . NASAL SEPTUM SURGERY  1996  . NEGATIVE SLEEP STUDY  1996  . TRANSTHORACIC ECHOCARDIOGRAM  02-07-2004   MILD [facility]/  EF 55-65%        Home Medications    Prior to Admission medications   Medication Sig Start Date End Date Taking? Authorizing Provider  acetic acid 0.25 % irrigation Irrigate with as directed daily. Instill 30ml and clamp tube for 30 minutes then drain. 06/03/15  Yes [name], MD  AZO-CRANBERRY PO Take 1 tablet by  mouth 2 (two) times daily.   Yes [provider]  baclofen (LIORESAL) 20 MG tablet TAKE 2 TABLETS BY MOUTH THREE TIMES DAILY 04/20/18  Yes York Spaniel, MD  diazepam (VALIUM) 5 MG tablet Take 1 tablet (5 mg total) by mouth 3 (three) times daily. 11/27/17  Yes York Spaniel, MD  levothyroxine (SYNTHROID, LEVOTHROID) 125 MCG tablet Take 125 mcg by mouth daily. 05/22/15  Yes [provider]  lisinopril-hydrochlorothiazide (PRINZIDE,ZESTORETIC) 10-12.5 MG tablet Take 0.5 tablets by mouth every other day.  09/05/15  Yes [provider]  omeprazole (PRILOSEC) 20 MG capsule Take 20 mg by mouth every morning.    Yes [provider]  oxybutynin (DITROPAN XL) 15 MG 24 hr tablet Take 15 mg by mouth every morning.  03/30/18  Yes [provider]  perphenazine (TRILAFON) 2 MG tablet TAKE 1 TABLET BY MOUTH TWICE DAILY 11/17/17  Yes York Spaniel, MD  tamsulosin (FLOMAX) 0.4 MG CAPS capsule Take 1 capsule (0.4 mg total) by mouth daily. 12/29/17  Yes Ray Church III, MD  tiZANidine (ZANAFLEX) 4 MG tablet TAKE 2 TABLETS BY MOUTH THREE TIMES DAILY PLEASE  CALL  [phone]  TO  SCHEDULE  AN  APPT Patient taking differently: Take 4 mg by mouth every 8 (eight) hours as needed for muscle spasms.  04/03/18  Yes York Spaniel, MD  trazodone (DESYREL) 300 MG tablet TAKE 1 TABLET BY MOUTH ONCE DAILY AT BEDTIME 11/24/17  Yes York Spaniel, MD  venlafaxine XR (EFFEXOR-XR) 75 MG 24 hr capsule TAKE 2 CAPSULES BY MOUTH ONCE DAILY WITH BREAKFAST Patient taking differently: Take 150 mg by mouth daily with breakfast.  02/18/18  Yes York Spaniel, MD  docusate sodium (COLACE) 250 MG capsule Take 250 mg by mouth daily as needed for constipation.     [provider]  HYDROcodone-acetaminophen (NORCO/VICODIN) 5-325 MG tablet Take 1 tablet by mouth every 6 (six) hours as needed. Patient not taking: Reported on 04/20/2018 05/21/16   [name], NP  scopolamine  (TRANSDERM-SCOP, 1.5 MG,) 1 MG/3DAYS Place 1 patch (1.5 mg total) onto the skin every 3 (three) days. Patient not taking: Reported on 04/20/2018 05/18/15   York Spaniel, MD  traMADol (ULTRAM) 50 MG tablet TAKE 1 TABLET BY MOUTH EVERY 6 HOURS AS NEEDED Patient taking differently: Take 50 mg by mouth every 6 (six) hours as needed for moderate pain.  02/18/18   York Spaniel, MD  Vitamin D, Ergocalciferol, (DRISDOL) 50000 UNITS CAPS Take 50,000 Units by mouth every 7 (seven) days. Friday only    [provider]    Family History Family History  Problem Relation Age of Onset  . Stroke Father   . Cirrhosis Brother   . Hypothyroidism Sister     Social History Social History   Tobacco Use  . Smoking status: Former Smoker    Packs/day: 1.50    Years: 8.00    Pack years: 12.00    Types: Cigarettes  Last attempt to quit: 06/17/1985    Years since quitting: 32.8  . Smokeless tobacco: Never Used  Substance Use Topics  . Alcohol use: No    Alcohol/week: 0.0 standard drinks  . Drug use: No     Allergies   Penicillins and Primaxin [imipenem]   Review of Systems Review of Systems  Constitutional: Positive for chills and fatigue. Negative for appetite change.  HENT: Negative for congestion, ear discharge and sinus pressure.   Eyes: Negative for discharge.  Respiratory: Negative for cough.   Cardiovascular: Negative for chest pain.  Gastrointestinal: Negative for abdominal pain and diarrhea.  Genitourinary: Negative for frequency and hematuria.  Musculoskeletal: Negative for back pain.  Skin: Negative for rash.  Neurological: Negative for seizures and headaches.  Psychiatric/Behavioral: Negative for hallucinations.     Physical Exam Updated Vital Signs BP (!) 153/96   Pulse 97   Temp 98.2 F (36.8 C) (Oral)   Resp 17   SpO2 94%   Physical Exam  Constitutional: He is oriented to person, place, and time. He appears well-developed.  HENT:  Head:  Normocephalic.  Eyes: Conjunctivae and EOM are normal. No scleral icterus.  Neck: Neck supple. No thyromegaly present.  Cardiovascular: Normal rate and regular rhythm. Exam reveals no gallop and no friction rub.  No murmur heard. Pulmonary/Chest: No stridor. He has no wheezes. He has no rales. He exhibits no tenderness.  Abdominal: He exhibits no distension. There is no tenderness. There is no rebound.  Musculoskeletal: Normal range of motion. He exhibits no edema.  Lymphadenopathy:    He has no cervical adenopathy.  Neurological: He is oriented to person, place, and time. He exhibits normal muscle tone. Coordination abnormal.  Contractures in right upper extremity  Skin: No rash noted. No erythema.  Psychiatric: He has a normal mood and affect. His behavior is normal.     ED Treatments / Results  Labs (all labs ordered are listed, but only abnormal results are displayed) Labs Reviewed  CBC WITH DIFFERENTIAL/PLATELET - Abnormal; Notable for the following components:      Result Value   WBC 14.4 (*)    Neutro Abs 12.5 (*)    All other components within normal limits  COMPREHENSIVE METABOLIC PANEL - Abnormal; Notable for the following components:   Glucose, Bld 120 (*)    Creatinine, Ser 0.52 (*)    Alkaline Phosphatase 145 (*)    All other components within normal limits  URINALYSIS, ROUTINE W REFLEX MICROSCOPIC - Abnormal; Notable for the following components:   APPearance CLOUDY (*)    Hgb urine dipstick MODERATE (*)    Ketones, ur 5 (*)    Protein, ur 30 (*)    Nitrite POSITIVE (*)    Leukocytes, UA LARGE (*)    WBC, UA >50 (*)    Bacteria, UA FEW (*)    All other components within normal limits  URINE CULTURE    EKG None  Radiology Dg Chest 2 View  Result Date: 04/20/2018 CLINICAL DATA:  Shortness of breath low oxygen saturation EXAM: CHEST - 2 VIEW COMPARISON:  12/31/2017, 12/29/2017, 12/09/2017 FINDINGS: Mildly low lung volumes. No focal airspace disease or  pleural effusion. The cardiomediastinal silhouette is within normal limits. No pneumothorax is seen. IMPRESSION: No active cardiopulmonary disease. Electronically Signed   By: Jasmine Pang M.D.   On: 04/20/2018 19:40    Procedures Procedures (including critical care time)  Medications Ordered in ED Medications  meropenem (MERREM) 1 g in sodium chloride 0.9 %  100 mL IVPB (has no administration in time range)     Initial Impression / Assessment and Plan / ED Course  I have reviewed the triage vital signs and the nursing notes.  Pertinent labs & imaging results that were available during my care of the patient were reviewed by me and considered in my medical decision making (see chart for details).     Patient with urinary tract infection.  Patient will have urine culture done and will be treated with IV antibiotics in the hospital  Final Clinical Impressions(s) / ED Diagnoses   Final diagnoses:  Acute cystitis with hematuria    ED Discharge Orders    None       Bethann Berkshire, MD 04/20/18 2237

## 2018-04-20 NOTE — H&P (Signed)
Samuel [name] ZOX:[id number] DOB: 1960-10-15 DOA: 04/20/2018     PCP: Barbie Banner, MD   Outpatient Specialists:     NEurology     Dr.Willis   [facility] Patient arrived to ER on 04/20/18 at 1807  Patient coming from:    home Lives  With family   Chief Complaint:  Chief Complaint  Patient presents with  . Shortness of Breath    HPI: Samuel [name] is a 57 y.o. male with medical history significant of kidney stones, relapsing remitting multiple sclerosis followed by neurology, multiple contractures, neurogenic bladder status post suprapubic catheter, hypertension and hypothyroidism  Presented with 3-day history of fatigue decrease in appetite, nausea similar to prior presentation with kidney stone but milder.  is noted to be hypoxic at home down to 91% after 4 L improved to 96 noted to have low-grade fever up to 100.2 Recently admitted (from April 29 to May 2 )with ureteral calculus status post right ureteroscopy with laser lithotripsy and ureteral stent placement.  Complicated by postoperative respiratory distress and sepsis  requiring admission to ICU and initially on broad-spectrum antibiotics.  Respiratory status improved patient defervesced. He was transitioned over to ciprofloxacin and was discharged on ciprofloxacin for 7 days. 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  At that time he was noted to have a large 11 mm right renal calculi and a stent placement Discharge to home on cephalexin after sensitivities came back   Pt at baseline bed bound his sister able to move him twice a day,     While in ER: Meeting sepsis criteria with tachycardia elevated white blood cell count low-grade fever Likely source UTI The following Work up has been  ordered so far:  Orders Placed This Encounter  Procedures  . Urine culture  . DG Chest 2 View  . CBC with Differential/Platelet  . Comprehensive metabolic panel  . Urinalysis, Routine w reflex microscopic  . Consult to hospitalist  . EKG 12-Lead  . EKG 12-Lead  . Saline lock IV      Following Medications were ordered in ER: Medications  ciprofloxacin (CIPRO) IVPB 400 mg (has no administration in time range)    Significant initial  Findings: Abnormal Labs Reviewed  CBC WITH DIFFERENTIAL/PLATELET - Abnormal; Notable for the following components:      Result Value   WBC 14.4 (*)    Neutro Abs 12.5 (*)    All other components within normal limits  COMPREHENSIVE METABOLIC PANEL - Abnormal; Notable for the following components:   Glucose, Bld 120 (*)    Creatinine, Ser 0.52 (*)    Alkaline Phosphatase 145 (*)    All other components within normal limits  URINALYSIS, ROUTINE W REFLEX MICROSCOPIC - Abnormal; Notable for the following components:   APPearance CLOUDY (*)    Hgb urine dipstick MODERATE (*)    Ketones, ur 5 (*)    Protein, ur 30 (*)    Nitrite POSITIVE (*)    Leukocytes, UA LARGE (*)    WBC, UA >50 (*)    Bacteria, UA FEW (*)    All other components within normal limits     Na 141 K 3.6  Cr    Stable,  Lab Results  Component  Value Date   CREATININE 0.52 (L) 04/20/2018   CREATININE 0.62 12/31/2017   CREATININE 0.87 12/29/2017     HG/HCT   Stable,     Component Value Date/Time   HGB 16.0 04/20/2018 1910   HGB 15.3 06/10/2017 1239   HCT 47.4 04/20/2018 1910   HCT 44.1 06/10/2017 1239    Lactic Acid, Venous    Component Value Date/Time   LATICACIDVEN 1.86 12/09/2017 2127      UA   evidence of UTI      CXR -  NON acute  ECG:  Personally reviewed by me showing: HR : 102 Rhythm: NSR,  no evidence of ischemic changes QTC 462    ED Triage Vitals  Enc Vitals Group     BP 04/20/18 1824 (!) 158/89     Pulse Rate 04/20/18 1824 (!) 110      Resp 04/20/18 1824 (!) 25     Temp 04/20/18 1824 98.2 F (36.8 C)     Temp Source 04/20/18 1824 Oral     SpO2 04/20/18 1815 95 %     Weight --      Height --      Head Circumference --      Peak Flow --      Pain Score 04/20/18 1834 0     Pain Loc --      Pain Edu? --      Excl. in GC? --   TMAX(24)@       Latest  Blood pressure (!) 151/94, pulse (!) 109, temperature 98.2 F (36.8 C), temperature source Oral, resp. rate 19, SpO2 94 %.    Hospitalist was called for admission for sepsis due to UTI   Review of Systems:    Pertinent positives include:  Fevers, chills, fatigue,  Constitutional:  No weight loss, night sweats weight loss  HEENT:  No headaches, Difficulty swallowing,Tooth/dental problems,Sore throat,  No sneezing, itching, ear ache, nasal congestion, post nasal drip,  Cardio-vascular:  No chest pain, Orthopnea, PND, anasarca, dizziness, palpitations.no Bilateral lower extremity swelling  GI:  No heartburn, indigestion, abdominal pain, nausea, vomiting, diarrhea, change in bowel habits, loss of appetite, melena, blood in stool, hematemesis Resp:  no shortness of breath at rest. No dyspnea on exertion, No excess mucus, no productive cough, No non-productive cough, No coughing up of blood.No change in color of mucus.No wheezing. Skin:  no rash or lesions. No jaundice GU:  no dysuria, change in color of urine, no urgency or frequency. No straining to urinate.  No flank pain.  Musculoskeletal:  No joint pain or no joint swelling. No decreased range of motion. No back pain.  Psych:  No change in mood or affect. No depression or anxiety. No memory loss.  Neuro: no localizing neurological complaints, no tingling, no weakness, no double vision, no gait abnormality, no slurred speech, no confusion  All systems reviewed and apart from HOPI all are negative  Past Medical History:   Past Medical History:  Diagnosis Date  . Anxiety   . Bladder calculi   . Chronic  back pain   . Chronic fatigue   . Depression   . Foley catheter in place   . Gait disorder   . GERD (gastroesophageal reflux disease)   . History of kidney stones   . Hyperlipidemia   . Hypertension   . Hypothyroidism   . Incontinence of urine   . Multiple sclerosis (HCC) NEUROLOGIST-- [name]   dx 1993, JC virus negative 2/14---  CURRENTLY RECEIVING TYSABRI IV TREATMENT  . Neurogenic bladder   . Neurogenic bowel    intermittant constipation and diarrhea  . Pneumonia    dx  03-03-2014--  admitted for iv and oral antibiotics  . 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 Urinary retention   . Vitamin D deficiency   . Yeast infection    genital area      Past Surgical History:  Procedure Laterality Date  . APPENDECTOMY  age 40  . CYSTOSCOPY N/A 03/21/2014   Procedure: CYSTOSCOPY TREATMENT OF BLADDER CALCULI;  Surgeon: [name], MD;  Location: [facility];  Service: Urology;  Laterality: N/A;  . CYSTOSCOPY WITH BIOPSY N/A 12/29/2017   Procedure: TRANSURETHRAL RESECTION OF BLADDER TUMOR;  Surgeon: [name], MD;  Location: [facility];  Service: Urology;  Laterality: N/A;  . CYSTOSCOPY WITH RETROGRADE PYELOGRAM, URETEROSCOPY AND STENT PLACEMENT Right 12/11/2017   Procedure: CYSTOSCOPY WITH RETROGRADE PYELOGRAM, URETEROSCOPY AND STENT PLACEMENT RIGHT;  Surgeon: [name], MD;  Location: [facility];  Service: Urology;  Laterality: Right;  . CYSTOSCOPY WITH RETROGRADE PYELOGRAM, URETEROSCOPY AND STENT PLACEMENT Right 12/29/2017   Procedure: CYSTOSCOPY WITH RIGHT  RETROGRADE PYELOGRAM, URETEROSCOPY/LASER LITHOTRIPSY AND STENT PLACEMENT;  Surgeon: [name], MD;  Location: [facility];  Service: Urology;  Laterality: Right;  . INSERTION OF SUPRAPUBIC CATHETER N/A 03/21/2014   Procedure: INSERTION OF SUPRAPUBIC CATHETER;  Surgeon: [name], MD;  Location: [facility];  Service: Urology;  Laterality: N/A;  . NASAL SEPTUM SURGERY  1996  .  NEGATIVE SLEEP STUDY  1996  . TRANSTHORACIC ECHOCARDIOGRAM  02-07-2004   MILD LVH/  EF 55-65%    Social History:  Ambulatory  bed bound     reports that he quit smoking about 32 years ago. His smoking use included cigarettes. He has a 12.00 pack-year smoking history. He has never used smokeless tobacco. He reports that he does not drink alcohol or use drugs.     Family History:  Family History  Problem Relation Age of Onset  . Stroke Father   . Cirrhosis Brother   . Hypothyroidism Sister     Allergies: Allergies  Allergen Reactions  . Penicillins Other (See Comments)    Unknown childhoood reaction (takes amoxicillian with reaction) Has tolerated ceftriaxone, cefepime, and Primaxin during inpatient admissions  . Primaxin [Imipenem] Other (See Comments)    Seizure      Prior to Admission medications   Medication Sig Start Date End Date Taking? Authorizing Provider  acetic acid 0.25 % irrigation Irrigate with as directed daily. Instill 30ml and clamp tube for 30 minutes then drain. 06/03/15  Yes [name], MD  AZO-CRANBERRY PO Take 1 tablet by mouth 2 (two) times daily.   Yes [provider]  baclofen (LIORESAL) 20 MG tablet TAKE 2 TABLETS BY MOUTH THREE TIMES DAILY 04/20/18  Yes York Spaniel, MD  diazepam (VALIUM) 5 MG tablet Take 1 tablet (5 mg total) by mouth 3 (three) times daily. 11/27/17  Yes York Spaniel, MD  levothyroxine (SYNTHROID, LEVOTHROID) 125 MCG tablet Take 125 mcg by mouth daily. 05/22/15  Yes [provider]  lisinopril-hydrochlorothiazide (PRINZIDE,ZESTORETIC) 10-12.5 MG tablet Take 0.5 tablets by mouth every other day.  09/05/15  Yes [provider]  omeprazole (PRILOSEC) 20 MG capsule Take 20 mg by mouth every morning.    Yes [provider]  oxybutynin (DITROPAN XL) 15 MG 24 hr tablet Take 15 mg by mouth every morning.  03/30/18  Yes [provider]  perphenazine (TRILAFON) 2 MG tablet TAKE 1 TABLET BY  MOUTH TWICE DAILY 11/17/17  Yes York Spaniel, MD  tamsulosin (FLOMAX) 0.4 MG CAPS capsule Take 1 capsule (0.4 mg total) by mouth daily. 12/29/17  Yes Ray Church III, MD  tiZANidine (ZANAFLEX) 4 MG tablet TAKE 2 TABLETS BY MOUTH THREE TIMES DAILY PLEASE  CALL  [phone]  TO  SCHEDULE  AN  APPT Patient taking differently: Take 4 mg by mouth every 8 (eight) hours as needed for muscle spasms.  04/03/18  Yes York Spaniel, MD  trazodone (DESYREL) 300 MG tablet TAKE 1 TABLET BY MOUTH ONCE DAILY AT BEDTIME 11/24/17  Yes York Spaniel, MD  venlafaxine XR (EFFEXOR-XR) 75 MG 24 hr capsule TAKE 2 CAPSULES BY MOUTH ONCE DAILY WITH BREAKFAST Patient taking differently: Take 150 mg by mouth daily with breakfast.  02/18/18  Yes York Spaniel, MD  docusate sodium (COLACE) 250 MG capsule Take 250 mg by mouth daily as needed for constipation.     [provider]  HYDROcodone-acetaminophen (NORCO/VICODIN) 5-325 MG tablet Take 1 tablet by mouth every 6 (six) hours as needed. Patient not taking: Reported on 04/20/2018 05/21/16   [name], NP  scopolamine (TRANSDERM-SCOP, 1.5 MG,) 1 MG/3DAYS Place 1 patch (1.5 mg total) onto the skin every 3 (three) days. Patient not taking: Reported on 04/20/2018 05/18/15   York Spaniel, MD  traMADol (ULTRAM) 50 MG tablet TAKE 1 TABLET BY MOUTH EVERY 6 HOURS AS NEEDED Patient taking differently: Take 50 mg by mouth every 6 (six) hours as needed for moderate pain.  02/18/18   York Spaniel, MD  Vitamin D, Ergocalciferol, (DRISDOL) 50000 UNITS CAPS Take 50,000 Units by mouth every 7 (seven) days. Friday only    [provider]   Physical Exam: Blood pressure (!) 151/94, pulse (!) 109, temperature 98.2 F (36.8 C), temperature source Oral, resp. rate 19, SpO2 94 %. 1. General:  in No Acute distress  Chronically ill -appearing multiple contractures 2. Psychological: Alert and   Oriented 3. Head/ENT: Dry Mucous Membranes                           Head Non traumatic, neck supple                            Poor Dentition 4. SKIN:    decreased Skin turgor,  Skin clean Dry sacral decube 5. Heart: Regular rate and rhythm no Murmur, no Rub or gallop 6. Lungs:  no wheezes or crackles   7. Abdomen: Soft,  tender  distended   owel sounds present 8. Lower extremities: no clubbing, cyanosis, or  edema 9. Neurologically contractures of all 4 extremities  10. MSK: Normal range of motion   LABS:     Recent Labs  Lab 04/20/18 1910  WBC 14.4*  NEUTROABS 12.5*  HGB 16.0  HCT 47.4  MCV 86.5  PLT 276   Basic Metabolic Panel: Recent Labs  Lab 04/20/18 1910  NA 141  K 3.6  CL 103  CO2 26  GLUCOSE 120*  BUN 8  CREATININE 0.52*  CALCIUM 9.4      Recent Labs  Lab 04/20/18 1910  AST 21  ALT 30  ALKPHOS 145*  BILITOT 0.5  PROT 7.6  ALBUMIN 4.5   No results for input(s): LIPASE, AMYLASE in the last  168 hours. No results for input(s): AMMONIA in the last 168 hours.    HbA1C: No results for input(s): HGBA1C in the last 72 hours. CBG: No results for input(s): GLUCAP in the last 168 hours.    Urine analysis:    Component Value Date/Time   COLORURINE YELLOW 04/20/2018 2031   APPEARANCEUR CLOUDY (A) 04/20/2018 2031   LABSPEC 1.009 04/20/2018 2031   PHURINE 6.0 04/20/2018 2031   GLUCOSEU NEGATIVE 04/20/2018 2031   HGBUR MODERATE (A) 04/20/2018 2031   BILIRUBINUR NEGATIVE 04/20/2018 2031   KETONESUR 5 (A) 04/20/2018 2031   PROTEINUR 30 (A) 04/20/2018 2031   UROBILINOGEN 0.2 06/02/2015 0459   NITRITE POSITIVE (A) 04/20/2018 2031   LEUKOCYTESUR LARGE (A) 04/20/2018 2031       Cultures:    Component Value Date/Time   SDES  12/29/2017 1625    BLOOD RIGHT HAND Performed at [facility], [address], [place], [place] [address]    SDES  12/29/2017 1625    BLOOD LEFT HAND Performed at [facility], [address], [place], [place] [address]    SPECREQUEST  12/29/2017  1625    BOTTLES DRAWN AEROBIC ONLY Blood Culture adequate volume   SPECREQUEST  12/29/2017 1625    BOTTLES DRAWN AEROBIC ONLY Blood Culture results may not be optimal due to an inadequate volume of blood received in culture bottles   CULT  12/29/2017 1625    NO GROWTH 5 DAYS Performed at [facility], [address], [place], [place] [address]    CULT  12/29/2017 1625    NO GROWTH 5 DAYS Performed at [facility], [address] [place], [place] [address]    REPTSTATUS 01/03/2018 FINAL 12/29/2017 1625   REPTSTATUS 01/03/2018 FINAL 12/29/2017 1625     Radiological Exams on Admission: Ct Abdomen Pelvis Wo Contrast  Result Date: 04/21/2018 CLINICAL DATA:  Flank pain, recurrent stone disease suspected. EXAM: CT ABDOMEN AND PELVIS WITHOUT CONTRAST TECHNIQUE: Multidetector CT imaging of the abdomen and pelvis was performed following the standard protocol without IV contrast. COMPARISON:  CT 12/10/2017 FINDINGS: Lower chest: Trace pleural effusions with mild improvement from prior exam. Lower lobe scarring or atelectasis. Calcified granuloma in the right lower lobe. Hepatobiliary: Decreased hepatic density suggesting steatosis. No discrete focal lesion. Gallbladder physiologically distended, no calcified stone. No biliary dilatation. Pancreas: Fatty atrophy.  No ductal dilatation or inflammation. Spleen: Normal in size without focal abnormality. Small splenule superiorly. Adrenals/Urinary Tract: No adrenal nodule. No hydronephrosis. Decreased stone burden in the right kidney from prior exam with small nonobstructing stones in the upper pole largest measuring 7 mm. No ureteral calculi. Mild bilateral perinephric edema, improved on the right from prior exam. Suprapubic catheter decompresses the urinary bladder. Possible punctate stone in the right urinary trigone. Stomach/Bowel: Moderate stool in the ascending and rectosigmoid colon. Mild rectal wall thickening. Perirectal stranding is  unchanged from prior exam. No bowel obstruction or inflammation. Appendix not visualized, surgically absent per history. Stomach physiologically distended. Vascular/Lymphatic: Mild aortic and iliac atherosclerosis. No aneurysm. No abdominopelvic adenopathy. Reproductive: Prostate is unremarkable. Other: No free air, free fluid or intra-abdominal abscess. Tiny fat containing umbilical hernia. Musculoskeletal: There are no acute or suspicious osseous abnormalities. The bones are under mineralized. IMPRESSION: 1. No hydronephrosis. Nonobstructing stones in the upper right kidney with decreased stone burden from prior CT. 2. Possible punctate stone in the right urinary trigone without back pressure changes. 3. Moderate stool burden, mild rectal wall thickening  with increased stool in the rectum, suggesting chronic constipation. 4. Mild hepatic steatosis. Electronically Signed   By: Rubye Oaks M.D.   On: 04/21/2018 00:05   Dg Chest 2 View  Result Date: 04/20/2018 CLINICAL DATA:  Shortness of breath low oxygen saturation EXAM: CHEST - 2 VIEW COMPARISON:  12/31/2017, 12/29/2017, 12/09/2017 FINDINGS: Mildly low lung volumes. No focal airspace disease or pleural effusion. The cardiomediastinal silhouette is within normal limits. No pneumothorax is seen. IMPRESSION: No active cardiopulmonary disease. Electronically Signed   By: Jasmine Pang M.D.   On: 04/20/2018 19:40    Chart has been reviewed    Assessment/Plan   57 y.o. male with medical history significant of kidney stones, relapsing remitting multiple sclerosis followed by neurology, multiple contractures, neurogenic bladder status post suprapubic catheter, hypertension and hypothyroidism  Admitted for sepsis due to UTI  Present on Admission: . Sepsis (HCC) rehydrate and administer IV antibiotics most likely source being UTI given history of ESBL treated with meropenem . Neurogenic bladder status post suprapubic catheter continue home  medications . Essential hypertension currently stable continue to monitor . History of ESBL Klebsiella pneumoniae infection await results of urine culture and adjust antibiotic coverage as needed  . Malnutrition of moderate degree (HCC) would benefit from nutritional consult . Multiple sclerosis (HCC) stable chronic continue home medications and follow-up with neurology as an outpatient Decubitus ulcer with wound care consult  . Lower urinary tract infectious disease treat underlying infection with meropenem await results of urine culture . 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  DVT prophylaxis:  SCD      Code Status:  FULL CODE   as per patient   I had personally discussed CODE STATUS with patient and   Family Communication:   Family not at  Bedside    Disposition Plan:     To home once workup is complete and patient is stable                                      Wound care  consulted               Consults called: none   Admission status:   inpatient     Level of care     tele      For persistent tachycardia      Horst Ostermiller 04/21/2018, 12:26 AM   [facility]  Pager [phone]   after 2 AM please page floor coverage PA If 7AM-7PM, please contact the day team taking care of the patient  [email or web address]  Password [email or web address]

## 2018-04-20 NOTE — ED Notes (Signed)
Bed: HQ46 Expected date:  Expected time:  Means of arrival:  Comments: EMS- shortness of breath

## 2018-04-20 NOTE — ED Triage Notes (Signed)
BIB [facility] from home. PT is c/o 3 day fatigue, decrease in appetite. RA sats were 91%, 96% on 4 L. Hx of MS. 500 NS fluid bolus given en route. Temp was 100.2 per EMS.

## 2018-04-21 ENCOUNTER — Encounter (HOSPITAL_COMMUNITY): Payer: Self-pay

## 2018-04-21 ENCOUNTER — Other Ambulatory Visit: Payer: Self-pay

## 2018-04-21 DIAGNOSIS — N390 Urinary tract infection, site not specified: Secondary | ICD-10-CM | POA: Diagnosis present

## 2018-04-21 DIAGNOSIS — L8990 Pressure ulcer of unspecified site, unspecified stage: Secondary | ICD-10-CM

## 2018-04-21 LAB — COMPREHENSIVE METABOLIC PANEL
ALT: 28 U/L (ref 0–44)
AST: 19 U/L (ref 15–41)
Albumin: 4 g/dL (ref 3.5–5.0)
Alkaline Phosphatase: 127 U/L — ABNORMAL HIGH (ref 38–126)
Anion gap: 11 (ref 5–15)
BUN: 10 mg/dL (ref 6–20)
CO2: 25 mmol/L (ref 22–32)
Calcium: 9 mg/dL (ref 8.9–10.3)
Chloride: 103 mmol/L (ref 98–111)
Creatinine, Ser: 0.52 mg/dL — ABNORMAL LOW (ref 0.61–1.24)
GFR calc Af Amer: >60 mL/min (ref >60)
GFR calc non Af Amer: >60 mL/min (ref >60)
Glucose, Bld: 102 mg/dL — ABNORMAL HIGH (ref 70–99)
Potassium: 3.9 mmol/L (ref 3.5–5.1)
Sodium: 139 mmol/L (ref 135–145)
Total Bilirubin: 0.7 mg/dL (ref 0.3–1.2)
Total Protein: 6.9 g/dL (ref 6.5–8.1)

## 2018-04-21 LAB — CBC
HCT: 45 % (ref 39.0–52.0)
Hemoglobin: 15 g/dL (ref 13.0–17.0)
MCH: 29.2 pg (ref 26.0–34.0)
MCHC: 33.3 g/dL (ref 30.0–36.0)
MCV: 87.7 fL (ref 78.0–100.0)
Platelets: 251 10*3/uL (ref 150–400)
RBC: 5.13 MIL/uL (ref 4.22–5.81)
RDW: 13 % (ref 11.5–15.5)
WBC: 13.3 10*3/uL — ABNORMAL HIGH (ref 4.0–10.5)

## 2018-04-21 LAB — APTT: aPTT: 32 seconds (ref 24–36)

## 2018-04-21 LAB — PROTIME-INR
INR: 0.98
Prothrombin Time: 12.9 seconds (ref 11.4–15.2)

## 2018-04-21 LAB — LACTIC ACID, PLASMA
Lactic Acid, Venous: 3.5 mmol/L (ref 0.5–1.9)
Lactic Acid, Venous: 1.2 mmol/L (ref 0.5–1.9)

## 2018-04-21 LAB — TSH: TSH: 1.393 u[IU]/mL (ref 0.350–4.500)

## 2018-04-21 LAB — PHOSPHORUS: Phosphorus: 2.5 mg/dL (ref 2.5–4.6)

## 2018-04-21 LAB — PROCALCITONIN: Procalcitonin: <0.1 ng/mL

## 2018-04-21 LAB — MAGNESIUM: Magnesium: 2 mg/dL (ref 1.7–2.4)

## 2018-04-21 MED ORDER — GERHARDT'S BUTT CREAM
TOPICAL_CREAM | Freq: Three times a day (TID) | CUTANEOUS | Status: DC
  Administered 2018-04-21 – 2018-04-23 (×5): via TOPICAL
  Filled 2018-04-21: qty 1

## 2018-04-21 MED ORDER — TAMSULOSIN HCL 0.4 MG PO CAPS
0.4000 mg | ORAL_CAPSULE | Freq: Every day | ORAL | Status: DC
  Administered 2018-04-21 – 2018-04-23 (×3): 0.4 mg via ORAL
  Filled 2018-04-21 (×3): qty 1

## 2018-04-21 MED ORDER — ACETAMINOPHEN 650 MG RE SUPP: 650.0000 mg | Freq: Four times a day (QID) | RECTAL | Status: DC | PRN

## 2018-04-21 MED ORDER — TRAMADOL HCL 50 MG PO TABS: 50.0000 mg | ORAL_TABLET | Freq: Four times a day (QID) | ORAL | Status: DC | PRN

## 2018-04-21 MED ORDER — OXYBUTYNIN CHLORIDE ER 5 MG PO TB24
15.0000 mg | ORAL_TABLET | ORAL | Status: DC
  Administered 2018-04-21 – 2018-04-23 (×3): 15 mg via ORAL
  Filled 2018-04-21 (×3): qty 3

## 2018-04-21 MED ORDER — SODIUM CHLORIDE 0.9 % IV SOLN
INTRAVENOUS | Status: AC
  Administered 2018-04-21 (×2): via INTRAVENOUS

## 2018-04-21 MED ORDER — POLYETHYLENE GLYCOL 3350 17 G PO PACK: 17.0000 g | PACK | Freq: Every day | ORAL | Status: DC | PRN

## 2018-04-21 MED ORDER — SODIUM CHLORIDE 0.9 % IV BOLUS
500.0000 mL | Freq: Once | INTRAVENOUS | Status: AC
  Administered 2018-04-21: 500 mL via INTRAVENOUS

## 2018-04-21 MED ORDER — HYDROCODONE-ACETAMINOPHEN 5-325 MG PO TABS
1.0000 | ORAL_TABLET | ORAL | Status: DC | PRN
  Administered 2018-04-23: 2 via ORAL
  Filled 2018-04-21: qty 2

## 2018-04-21 MED ORDER — ACETAMINOPHEN 325 MG PO TABS
650.0000 mg | ORAL_TABLET | Freq: Four times a day (QID) | ORAL | Status: DC | PRN
  Administered 2018-04-22 (×2): 650 mg via ORAL
  Filled 2018-04-21 (×3): qty 2

## 2018-04-21 MED ORDER — SODIUM CHLORIDE 0.9 % IV SOLN
1.0000 g | Freq: Three times a day (TID) | INTRAVENOUS | Status: DC
  Administered 2018-04-21 – 2018-04-22 (×4): 1 g via INTRAVENOUS
  Filled 2018-04-21 (×5): qty 1

## 2018-04-21 MED ORDER — PERPHENAZINE 2 MG PO TABS
2.0000 mg | ORAL_TABLET | Freq: Two times a day (BID) | ORAL | Status: DC
  Administered 2018-04-21 – 2018-04-23 (×6): 2 mg via ORAL
  Filled 2018-04-21 (×6): qty 1

## 2018-04-21 MED ORDER — BACLOFEN 20 MG PO TABS
40.0000 mg | ORAL_TABLET | Freq: Three times a day (TID) | ORAL | Status: DC
  Administered 2018-04-21 – 2018-04-23 (×7): 40 mg via ORAL
  Filled 2018-04-21 (×7): qty 2

## 2018-04-21 MED ORDER — AMLODIPINE BESYLATE 5 MG PO TABS
5.0000 mg | ORAL_TABLET | Freq: Every day | ORAL | Status: DC
  Administered 2018-04-21 – 2018-04-23 (×3): 5 mg via ORAL
  Filled 2018-04-21 (×3): qty 1

## 2018-04-21 MED ORDER — ONDANSETRON HCL 4 MG/2ML IJ SOLN
4.0000 mg | Freq: Four times a day (QID) | INTRAMUSCULAR | Status: DC | PRN
  Administered 2018-04-21: 4 mg via INTRAVENOUS
  Filled 2018-04-21: qty 2

## 2018-04-21 MED ORDER — ONDANSETRON HCL 4 MG PO TABS: 4.0000 mg | ORAL_TABLET | Freq: Four times a day (QID) | ORAL | Status: DC | PRN

## 2018-04-21 MED ORDER — SODIUM CHLORIDE 0.9 % IV BOLUS: 500.0000 mL | Freq: Once | INTRAVENOUS | Status: DC

## 2018-04-21 MED ORDER — BISACODYL 10 MG RE SUPP: 10.0000 mg | Freq: Every day | RECTAL | Status: DC | PRN

## 2018-04-21 MED ORDER — LEVOTHYROXINE SODIUM 25 MCG PO TABS
125.0000 ug | ORAL_TABLET | Freq: Every day | ORAL | Status: DC
  Administered 2018-04-21 – 2018-04-23 (×3): 125 ug via ORAL
  Filled 2018-04-21 (×3): qty 1

## 2018-04-21 MED ORDER — ORAL CARE MOUTH RINSE
15.0000 mL | Freq: Two times a day (BID) | OROMUCOSAL | Status: DC
  Administered 2018-04-21 – 2018-04-22 (×4): 15 mL via OROMUCOSAL

## 2018-04-21 MED ORDER — VENLAFAXINE HCL ER 150 MG PO CP24
150.0000 mg | ORAL_CAPSULE | Freq: Every day | ORAL | Status: DC
  Administered 2018-04-21 – 2018-04-23 (×3): 150 mg via ORAL
  Filled 2018-04-21 (×3): qty 1

## 2018-04-21 MED ORDER — TRAZODONE HCL 100 MG PO TABS
300.0000 mg | ORAL_TABLET | Freq: Every day | ORAL | Status: DC
  Administered 2018-04-21 – 2018-04-22 (×3): 300 mg via ORAL
  Filled 2018-04-21 (×3): qty 3

## 2018-04-21 MED ORDER — SENNA 8.6 MG PO TABS
1.0000 | ORAL_TABLET | Freq: Two times a day (BID) | ORAL | Status: DC
  Administered 2018-04-21 – 2018-04-23 (×5): 8.6 mg via ORAL
  Filled 2018-04-21 (×5): qty 1

## 2018-04-21 MED ORDER — DIAZEPAM 5 MG PO TABS: 5.0000 mg | ORAL_TABLET | Freq: Three times a day (TID) | ORAL | Status: DC | PRN

## 2018-04-21 NOTE — ED Notes (Signed)
ED TO INPATIENT HANDOFF REPORT  Name/Age/Gender Samuel [name] 57 y.o. male  Code Status Code Status History    Date Active Date Inactive Code Status Order ID Comments User Context   12/29/2017 1602 01/01/2018 1855 Full Code [id number]  Lattie Corns Inpatient   12/29/2017 1602 12/29/2017 1602 Full Code [id number]  Lucas Mallow, MD Inpatient   12/10/2017 0018 12/12/2017 2015 Full Code [id number]  Merton Border, MD Inpatient   10/10/2015 2306 10/13/2015 1911 Full Code [id number]  Rise Patience, MD Inpatient   06/02/2015 0904 06/05/2015 2257 Full Code [id number]  Reyne Dumas, MD ED   07/10/2014 1322 07/14/2014 2050 Full Code [id number]  Bonnielee Haff, MD Inpatient   03/02/2014 2236 03/03/2014 1750 Full Code [id number]  Phillips Grout, MD Inpatient      Home/SNF/Other Home  Chief Complaint Shortness of breath  Level of Care/Admitting Diagnosis ED Disposition    ED Disposition Condition [facility]: [facility] [[id number]]  Level of Care: Telemetry [5]  Admit to tele based on following criteria: Other see comments  Comments: tachycardia  Diagnosis: Sepsis [facility]) [[id number]]  Admitting Physician: Toy Baker [[id number]]  Attending Physician: Toy Baker [[id number]]  Estimated length of stay: 3 - 4 days  Certification:: I certify this patient will need inpatient services for at least 2 midnights  PT Class (Do Not Modify): Inpatient [101]  PT Acc Code (Do Not Modify): Private [1]       Medical History Past Medical History:  Diagnosis Date  . Anxiety   . Bladder calculi   . Chronic back pain   . Chronic fatigue   . Depression   . Foley catheter in place   . Gait disorder   . GERD (gastroesophageal reflux disease)   . History of kidney stones   . Hyperlipidemia   . Hypertension   . Hypothyroidism   . Incontinence of urine   . Multiple sclerosis ([place]) NEUROLOGIST-- DR Jannifer Franklin   dx 1993, JC virus negative 2/14---  CURRENTLY  RECEIVING TYSABRI IV TREATMENT  . Neurogenic bladder   . Neurogenic bowel    intermittant constipation and diarrhea  . Pneumonia    dx  03-03-2014--  admitted for iv and oral antibiotics  . 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 Urinary retention   . Vitamin D deficiency   . Yeast infection    genital area    Allergies Allergies  Allergen Reactions  . Penicillins Other (See Comments)    Unknown childhoood reaction (takes amoxicillian with reaction) Has tolerated ceftriaxone, cefepime, and Primaxin during inpatient admissions  . Primaxin [Imipenem] Other (See Comments)    Seizure     IV Location/Drains/Wounds Patient Lines/Drains/Airways Status   Active Line/Drains/Airways    Name:   Placement date:   Placement time:   Site:   Days:   Peripheral IV 04/20/18 Left Wrist   04/20/18    -    Wrist   1   Suprapubic Catheter Double-lumen;Latex 16 Fr.   12/11/17    1545    Double-lumen;Latex   131   Ureteral Drain/Stent Right ureter 6 Fr.   12/29/17    1256    (S) Right ureter   113          Labs/Imaging Results for orders placed or performed during the hospital encounter of 04/20/18 (from the past 48 hour(s))  CBC with Differential/Platelet     Status: Abnormal   Collection Time: 04/20/18  7:10 PM  Result Value Ref Range   WBC 14.4 (H) 4.0 - 10.5 K/uL   RBC 5.48 4.22 - 5.81 MIL/uL   Hemoglobin 16.0 13.0 - 17.0 g/dL   HCT 47.4 39.0 - 52.0 %   MCV 86.5 78.0 - 100.0 fL   MCH 29.2 26.0 - 34.0 pg   MCHC 33.8 30.0 - 36.0 g/dL   RDW 12.9 11.5 - 15.5 %   Platelets 276 150 - 400 K/uL   Neutrophils Relative % 87 %   Neutro Abs 12.5 (H) 1.7 - 7.7 K/uL   Lymphocytes Relative 6 %   Lymphs Abs 0.9 0.7 - 4.0 K/uL   Monocytes Relative 7 %   Monocytes Absolute 1.0 0.1 - 1.0 K/uL   Eosinophils Relative 0 %   Eosinophils Absolute 0.0 0.0 - 0.7 K/uL   Basophils Relative 0 %   Basophils Absolute 0.0 0.0 - 0.1 K/uL    Comment: Performed at [facility], [place]  [address], [place], [place] [address]  Comprehensive metabolic panel     Status: Abnormal   Collection Time: 04/20/18  7:10 PM  Result Value Ref Range   Sodium 141 135 - 145 mmol/L   Potassium 3.6 3.5 - 5.1 mmol/L   Chloride 103 98 - 111 mmol/L   CO2 26 22 - 32 mmol/L   Glucose, Bld 120 (H) 70 - 99 mg/dL   BUN 8 6 - 20 mg/dL   Creatinine, Ser 0.52 (L) 0.61 - 1.24 mg/dL   Calcium 9.4 8.9 - 10.3 mg/dL   Total Protein 7.6 6.5 - 8.1 g/dL   Albumin 4.5 3.5 - 5.0 g/dL   AST 21 15 - 41 U/L   ALT 30 0 - 44 U/L   Alkaline Phosphatase 145 (H) 38 - 126 U/L   Total Bilirubin 0.5 0.3 - 1.2 mg/dL   GFR calc non Af Amer >60 >60 mL/min   GFR calc Af Amer >60 >60 mL/min    Comment: (NOTE) The eGFR has been calculated using the CKD EPI equation. This calculation has not been validated in all clinical situations. eGFR's persistently <60 mL/min signify possible Chronic Kidney Disease.    Anion gap 12 5 - 15    Comment: Performed at [facility], [place] [address] [place], [place] [address]  Urinalysis, Routine w reflex microscopic     Status: Abnormal   Collection Time: 04/20/18  8:31 PM  Result Value Ref Range   Color, Urine YELLOW YELLOW   APPearance CLOUDY (A) CLEAR   Specific Gravity, Urine 1.009 1.005 - 1.030   pH 6.0 5.0 - 8.0   Glucose, UA NEGATIVE NEGATIVE mg/dL   Hgb urine dipstick MODERATE (A) NEGATIVE   Bilirubin Urine NEGATIVE NEGATIVE   Ketones, ur 5 (A) NEGATIVE mg/dL   Protein, ur 30 (A) NEGATIVE mg/dL   Nitrite POSITIVE (A) NEGATIVE   Leukocytes, UA LARGE (A) NEGATIVE   RBC / HPF 21-50 0 - 5 RBC/hpf   WBC, UA >50 (H) 0 - 5 WBC/hpf   Bacteria, UA FEW (A) NONE SEEN   Mucus PRESENT     Comment: Performed at [facility], [place] [address], [place],  [address]   Ct Abdomen Pelvis Wo Contrast  Result Date: 04/21/2018 CLINICAL DATA:  Flank pain, recurrent stone disease suspected. EXAM: CT ABDOMEN AND PELVIS WITHOUT CONTRAST TECHNIQUE:  Multidetector CT imaging of the abdomen and pelvis was performed following the standard protocol without IV contrast. COMPARISON:  CT 12/10/2017 FINDINGS: Lower chest:  Trace pleural effusions with mild improvement from prior exam. Lower lobe scarring or atelectasis. Calcified granuloma in the right lower lobe. Hepatobiliary: Decreased hepatic density suggesting steatosis. No discrete focal lesion. Gallbladder physiologically distended, no calcified stone. No biliary dilatation. Pancreas: Fatty atrophy.  No ductal dilatation or inflammation. Spleen: Normal in size without focal abnormality. Small splenule superiorly. Adrenals/Urinary Tract: No adrenal nodule. No hydronephrosis. Decreased stone burden in the right kidney from prior exam with small nonobstructing stones in the upper pole largest measuring 7 mm. No ureteral calculi. Mild bilateral perinephric edema, improved on the right from prior exam. Suprapubic catheter decompresses the urinary bladder. Possible punctate stone in the right urinary trigone. Stomach/Bowel: Moderate stool in the ascending and rectosigmoid colon. Mild rectal wall thickening. Perirectal stranding is unchanged from prior exam. No bowel obstruction or inflammation. Appendix not visualized, surgically absent per history. Stomach physiologically distended. Vascular/Lymphatic: Mild aortic and iliac atherosclerosis. No aneurysm. No abdominopelvic adenopathy. Reproductive: Prostate is unremarkable. Other: No free air, free fluid or intra-abdominal abscess. Tiny fat containing umbilical hernia. Musculoskeletal: There are no acute or suspicious osseous abnormalities. The bones are under mineralized. IMPRESSION: 1. No hydronephrosis. Nonobstructing stones in the upper right kidney with decreased stone burden from prior CT. 2. Possible punctate stone in the right urinary trigone without back pressure changes. 3. Moderate stool burden, mild rectal wall thickening with increased stool in the rectum,  suggesting chronic constipation. 4. Mild hepatic steatosis. Electronically Signed   By: Jeb Levering M.D.   On: 04/21/2018 00:05   Dg Chest 2 View  Result Date: 04/20/2018 CLINICAL DATA:  Shortness of breath low oxygen saturation EXAM: CHEST - 2 VIEW COMPARISON:  12/31/2017, 12/29/2017, 12/09/2017 FINDINGS: Mildly low lung volumes. No focal airspace disease or pleural effusion. The cardiomediastinal silhouette is within normal limits. No pneumothorax is seen. IMPRESSION: No active cardiopulmonary disease. Electronically Signed   By: Donavan Foil M.D.   On: 04/20/2018 19:40    Pending Labs Unresulted Labs (From admission, onward)    Start     Ordered   04/20/18 2204  Urine culture  STAT,   STAT     04/20/18 2204   Signed and Held  Culture, blood (x 2)  BLOOD CULTURE X 2,   STAT    Comments:  INITIATE ANTIBIOTICS WITHIN 1 HOUR AFTER BLOOD CULTURES DRAWN.  If unable to obtain blood cultures, call MD immediately regarding antibiotic instructions.    Signed and Held   Signed and Held  Lactic acid, plasma  STAT Now then every 3 hours,   STAT     Signed and Held   Signed and Held  Procalcitonin  STAT,   R     Signed and Held   Signed and Held  Protime-INR  STAT,   R     Signed and Held   Signed and Held  APTT  STAT,   R     Signed and Held   Signed and Held  Magnesium  Tomorrow morning,   R    Comments:  Call MD if <1.5    Signed and Held   Signed and Held  Phosphorus  Tomorrow morning,   R     Signed and Held   Signed and Held  TSH  Once,   R    Comments:  Cancel if already done within 1 month and notify MD    Signed and Held   Signed and Held  Comprehensive metabolic panel  Once,   R  Comments:  Cal MD for K<3.5 or >5.0    Signed and Held   Signed and Held  CBC  Once,   R    Comments:  Call for hg <8.0    Signed and Held          Vitals/Pain Today's Vitals   04/20/18 1826 04/20/18 1834 04/20/18 2033 04/20/18 2230  BP:   (!) 151/94 (!) 153/96  Pulse:   (!) 109 97   Resp:   19 17  Temp:      TempSrc:      SpO2: 92%  94% 94%  PainSc:  0-No pain      Isolation Precautions No active isolations  Medications Medications  potassium chloride SA (K-DUR,KLOR-CON) CR tablet 40 mEq (has no administration in time range)  meropenem (MERREM) 1 g in sodium chloride 0.9 % 100 mL IVPB ( Intravenous Stopped 04/20/18 2339)    Mobility non-ambulatory

## 2018-04-21 NOTE — Consult Note (Signed)
WOC Nurse wound consult note Evaluation of patient completed in [facility] 1410 with the assistance of his primary care RN. Reason for Consult: Decubitus ulcers per consult request Wound type:  The patient has erythematous patches to bilateral buttock.  He states that for years fluid filled areas will pop up, eventually drain, and the dry up.  Today I did not observe any fluid filled areas, on roundish erythematous patches on the buttocks, and a couple of dried, non-erythematous round areas to the lower back and these have dried peeling skin.  The patient states none of the areas burn, itch, or hurt.  They have not had an evaluation by a dermatologist.  Once discharged I do highly recommend site evaluation by a dermatologist.  The areas resemble bullous pemphigus. Drainage (amount, consistency, odor) No drainage, induration or odor Periwound: Dried, peeled areas scattered throughout the buttocks and lower back. Dressing procedure/placement/frequency: Gerhardt's butt cream for the antifungal and steroid components, and the zinc for healing.  NO use of sacral foam dressings. Monitor the wound area(s) for worsening of condition such as: Signs/symptoms of infection,  Increase in size,  Development of or worsening of odor, Development of pain, or increased pain at the affected locations.  Notify the medical team if any of these develop.  Thank you for the consult.  Discussed plan of care with the patient and bedside nurse.  WOC nurse will not follow at this time.  Please re-consult the WOC team if needed.  Helmut Muster, RN, MSN, CWOCN, CNS-BC, pager [phone]

## 2018-04-21 NOTE — Progress Notes (Signed)
CRITICAL VALUE ALERT  Critical Value:  Lactic 3.5   Date & Time Notied: 0258 04/21/18  Provider Notified: schorr  Orders Received/Actions taken: New orders placed

## 2018-04-21 NOTE — Progress Notes (Signed)
Pharmacy Antibiotic Note  Samuel [name] is a 57 y.o. male admitted on 04/20/2018 with sepsis- presumed urinary source.  PMH significant for kidney stones and MS with neurogenic bladder requiring suprapubic catheter.  He has several recent admissions for UTI/kidney stones- most recent cultures positive for pan-sensitive citrobacter & enterococcus, however patient had ESBL-producing culture in 2015.  Pharmacy has been consulted for Meropenem dosing.  Allergies noted- patient had seizure during Feb 2017 which was linked to Primaxin despite patient having tolerated this during multiple admissions prior to 2017.  Will proceed with Meropenem since has lower risk for seizures and monitor patient closely.  Received initial dose in ED.   04/21/2018:  Afebrile  Elevated WBC- 14.4  Renal function at patient's baseline; Scr likely over-estimates renal function but presumed CrCl still >52ml/min.   Plan:  Meropenem 1gm IV q8h  Monitor renal function and cx data   Monitor for seizure activity  De-escalate as able once cx data available     Temp (24hrs), Avg:98.2 F (36.8 C), Min:98.1 F (36.7 C), Max:98.2 F (36.8 C)  Recent Labs  Lab 04/20/18 1910  WBC 14.4*  CREATININE 0.52*    CrCl cannot be calculated (Unknown ideal weight.).    Allergies  Allergen Reactions  . Penicillins Other (See Comments)    Unknown childhoood reaction (takes amoxicillian with reaction) Has tolerated ceftriaxone, cefepime, and Primaxin during inpatient admissions  . Primaxin [Imipenem] Other (See Comments)    Seizure     Antimicrobials this admission: 8/19 Meropenem >>   Dose adjustments this admission:  Microbiology results: 8/19 BCx:  8/19 UCx:    Thank you for allowing pharmacy to be a part of this patient's care.  Elson Clan 04/21/2018 1:25 AM

## 2018-04-21 NOTE — Progress Notes (Signed)
[facility]                                                                              Patient Demographics  Samuel [name], is a 57 y.o. male, DOB - 07-Dec-1960, XYI:[id number]  Admit date - 04/20/2018   Admitting Physician Toy Baker, MD  Outpatient Primary MD for the patient is Christain Sacramento, MD  Outpatient specialists:   LOS - 1  days   Medical records reviewed and are as summarized below:    Chief Complaint  Patient presents with  . Shortness of Breath       Brief summary   Patient is a 57 year old male with kidney stones, relapsing remitting multiple sclerosis followed by neurology, multiple contractures, neurogenic bladder status post suprapubic catheter, hypertension and hypothyroidism presented with 3-day history of fatigue, decrease in appetite, nausea, hypoxia at home down to 91% on 4 L, low-grade temp of 100.2 F. Recently admitted (from April 29 to May 2 )with ureteral calculus status post right ureteroscopy with laser lithotripsy and ureteral stent placement.  Complicated by postoperative respiratory distress and sepsis requiring admission to ICU and initially on broad-spectrum antibiotics, discharged on ciprofloxacin for 7 days. At baseline bedbound.  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, left -Relapsing and remitting, continue outpatient medications, follow-up with neurology    Malnutrition of moderate degree ([place]) -Nutrition consult, nutritional supplements recommended    Essential hypertension -BP currently elevated, placed on Norvasc 5 mg daily  Hypothyroidism  -Continue  Synthroid  History of anxiety, depression - continue trazodone, Effexor  Code Status: Full CODE STATUS DVT Prophylaxis:  SCD's Family Communication: Discussed in detail with the patient, all imaging results, lab results explained to the patient   Disposition Plan: When medically stable, cultures available  Time Spent in minutes 25 minutes  Procedures:  None  Consultants:   None  Antimicrobials:   IV meropenem   Medications  Scheduled Meds: . baclofen  40 mg Oral TID  . Gerhardt's butt cream   Topical TID  . levothyroxine  125 mcg Oral QAC breakfast  . mouth rinse  15 mL Mouth Rinse BID  . oxybutynin  15 mg Oral BH-q7a  . perphenazine  2 mg Oral BID  . senna  1 tablet Oral BID  . tamsulosin  0.4 mg Oral Daily  . trazodone  300 mg Oral QHS  . venlafaxine XR  150 mg Oral Q breakfast   Continuous Infusions: . meropenem (MERREM) IV 1 g (04/21/18 0645)  . sodium chloride     PRN Meds:.acetaminophen **OR** acetaminophen, bisacodyl, diazepam, HYDROcodone-acetaminophen, ondansetron **OR** ondansetron (ZOFRAN) IV, polyethylene glycol, traMADol   Antibiotics   Anti-infectives (From admission, onward)   Start     Dose/Rate Route Frequency Ordered Stop   04/21/18 0600  meropenem (MERREM) 1 g in sodium chloride 0.9 %  100 mL IVPB     1 g 200 mL/hr over 30 Minutes Intravenous Every 8 hours 04/21/18 0147     04/20/18 2300  meropenem (MERREM) 1 g in sodium chloride 0.9 % 100 mL IVPB     1 g 200 mL/hr over 30 Minutes Intravenous  Once 04/20/18 2232 04/20/18 2339   04/20/18 2215  ciprofloxacin (CIPRO) IVPB 400 mg  Status:  Discontinued     400 mg 200 mL/hr over 60 Minutes Intravenous  Once 04/20/18 2204 04/20/18 2231        Subjective:   Samuel [name] was seen and examined today.  No acute complaints, afebrile this morning, Patient denies dizziness, chest pain, shortness of breath, abdominal pain, N/V/D/C.  Objective:   Vitals:   04/21/18 0150 04/21/18 0351 04/21/18  0520 04/21/18 0950  BP:  (!) 165/96  (!) 156/91  Pulse:  (!) 130 79 (!) 101  Resp:  18  16  Temp:  98.2 F (36.8 C)  98.9 F (37.2 C)  TempSrc:  Oral  Oral  SpO2:  94%  98%  Weight: 69.7 kg     Height: 6' 1.5" (1.867 m)       Intake/Output Summary (Last 24 hours) at 04/21/2018 1303 Last data filed at 04/21/2018 1146 Gross per 24 hour  Intake 794 ml  Output 625 ml  Net 169 ml     Wt Readings from Last 3 Encounters:  04/21/18 69.7 kg  12/29/17 72.8 kg  12/11/17 72.6 kg     Exam  General: Alert and oriented x 3, NAD, ill-appearing  Eyes:   HEENT:  Atraumatic, normocephalic  Cardiovascular: S1 S2 auscultated, Regular rate and rhythm.  Respiratory: Clear to auscultation bilaterally, no wheezing, rales or rhonchi  Gastrointestinal: Soft, nontender, nondistended, + bowel sounds  Ext: no pedal edema bilaterally  Neuro: contractures  Musculoskeletal: No digital cyanosis, clubbing  Skin: No rashes  Psych: Normal affect and demeanor, alert and oriented x3    Data Reviewed:  I have personally reviewed following labs and imaging studies  Micro Results No results found for this or any previous visit (from the past 240 hour(s)).  Radiology Reports Ct Abdomen Pelvis Wo Contrast  Result Date: 04/21/2018 CLINICAL DATA:  Flank pain, recurrent stone disease suspected. EXAM: CT ABDOMEN AND PELVIS WITHOUT CONTRAST TECHNIQUE: Multidetector CT imaging of the abdomen and pelvis was performed following the standard protocol without IV contrast. COMPARISON:  CT 12/10/2017 FINDINGS: Lower chest: Trace pleural effusions with mild improvement from prior exam. Lower lobe scarring or atelectasis. Calcified granuloma in the right lower lobe. Hepatobiliary: Decreased hepatic density suggesting steatosis. No discrete focal lesion. Gallbladder physiologically distended, no calcified stone. No biliary dilatation. Pancreas: Fatty atrophy.  No ductal dilatation or inflammation. Spleen: Normal in  size without focal abnormality. Small splenule superiorly. Adrenals/Urinary Tract: No adrenal nodule. No hydronephrosis. Decreased stone burden in the right kidney from prior exam with small nonobstructing stones in the upper pole largest measuring 7 mm. No ureteral calculi. Mild bilateral perinephric edema, improved on the right from prior exam. Suprapubic catheter decompresses the urinary bladder. Possible punctate stone in the right urinary trigone. Stomach/Bowel: Moderate stool in the ascending and rectosigmoid colon. Mild rectal wall thickening. Perirectal stranding is unchanged from prior exam. No bowel obstruction or inflammation. Appendix not visualized, surgically absent per history. Stomach physiologically distended. Vascular/Lymphatic: Mild aortic and iliac atherosclerosis. No aneurysm. No abdominopelvic adenopathy. Reproductive: Prostate is unremarkable. Other: No free air, free fluid or intra-abdominal abscess. Tiny fat containing  umbilical hernia. Musculoskeletal: There are no acute or suspicious osseous abnormalities. The bones are under mineralized. IMPRESSION: 1. No hydronephrosis. Nonobstructing stones in the upper right kidney with decreased stone burden from prior CT. 2. Possible punctate stone in the right urinary trigone without back pressure changes. 3. Moderate stool burden, mild rectal wall thickening with increased stool in the rectum, suggesting chronic constipation. 4. Mild hepatic steatosis. Electronically Signed   By: Jeb Levering M.D.   On: 04/21/2018 00:05   Dg Chest 2 View  Result Date: 04/20/2018 CLINICAL DATA:  Shortness of breath low oxygen saturation EXAM: CHEST - 2 VIEW COMPARISON:  12/31/2017, 12/29/2017, 12/09/2017 FINDINGS: Mildly low lung volumes. No focal airspace disease or pleural effusion. The cardiomediastinal silhouette is within normal limits. No pneumothorax is seen. IMPRESSION: No active cardiopulmonary disease. Electronically Signed   By: Donavan Foil M.D.    On: 04/20/2018 19:40    Lab Data:  CBC: Recent Labs  Lab 04/20/18 1910 04/21/18 0444  WBC 14.4* 13.3*  NEUTROABS 12.5*  --   HGB 16.0 15.0  HCT 47.4 45.0  MCV 86.5 87.7  PLT 276 578   Basic Metabolic Panel: Recent Labs  Lab 04/20/18 1910 04/21/18 0444  NA 141 139  K 3.6 3.9  CL 103 103  CO2 26 25  GLUCOSE 120* 102*  BUN 8 10  CREATININE 0.52* 0.52*  CALCIUM 9.4 9.0  MG  --  2.0  PHOS  --  2.5   GFR: Estimated Creatinine Clearance: 100.4 mL/min (A) (by C-G formula based on SCr of 0.52 mg/dL (L)). Liver Function Tests: Recent Labs  Lab 04/20/18 1910 04/21/18 0444  AST 21 19  ALT 30 28  ALKPHOS 145* 127*  BILITOT 0.5 0.7  PROT 7.6 6.9  ALBUMIN 4.5 4.0   No results for input(s): LIPASE, AMYLASE in the last 168 hours. No results for input(s): AMMONIA in the last 168 hours. Coagulation Profile: Recent Labs  Lab 04/21/18 0202  INR 0.98   Cardiac Enzymes: No results for input(s): CKTOTAL, CKMB, CKMBINDEX, TROPONINI in the last 168 hours. BNP (last 3 results) No results for input(s): PROBNP in the last 8760 hours. HbA1C: No results for input(s): HGBA1C in the last 72 hours. CBG: No results for input(s): GLUCAP in the last 168 hours. Lipid Profile: No results for input(s): CHOL, HDL, LDLCALC, TRIG, CHOLHDL, LDLDIRECT in the last 72 hours. Thyroid Function Tests: Recent Labs    04/21/18 0444  TSH 1.393   Anemia Panel: No results for input(s): VITAMINB12, FOLATE, FERRITIN, TIBC, IRON, RETICCTPCT in the last 72 hours. Urine analysis:    Component Value Date/Time   COLORURINE YELLOW 04/20/2018 2031   APPEARANCEUR CLOUDY (A) 04/20/2018 2031   LABSPEC 1.009 04/20/2018 2031   PHURINE 6.0 04/20/2018 2031   GLUCOSEU NEGATIVE 04/20/2018 2031   HGBUR MODERATE (A) 04/20/2018 2031   BILIRUBINUR NEGATIVE 04/20/2018 2031   KETONESUR 5 (A) 04/20/2018 2031   PROTEINUR 30 (A) 04/20/2018 2031   UROBILINOGEN 0.2 06/02/2015 0459   NITRITE POSITIVE (A)  04/20/2018 2031   LEUKOCYTESUR LARGE (A) 04/20/2018 2031     Ripudeep Rai M.D. [facility] 04/21/2018, 1:03 PM  Pager: [phone] Between 7am to 7pm - call Pager - [phone]  After 7pm go to [email or web address] - password [email or web address]  Call night coverage person covering after 7pm

## 2018-04-22 DIAGNOSIS — N390 Urinary tract infection, site not specified: Secondary | ICD-10-CM

## 2018-04-22 DIAGNOSIS — G35 Multiple sclerosis: Secondary | ICD-10-CM

## 2018-04-22 DIAGNOSIS — E440 Moderate protein-calorie malnutrition: Secondary | ICD-10-CM

## 2018-04-22 DIAGNOSIS — N319 Neuromuscular dysfunction of bladder, unspecified: Secondary | ICD-10-CM

## 2018-04-22 DIAGNOSIS — I10 Essential (primary) hypertension: Secondary | ICD-10-CM

## 2018-04-22 LAB — BASIC METABOLIC PANEL
Anion gap: 9 (ref 5–15)
BUN: 8 mg/dL (ref 6–20)
CO2: 28 mmol/L (ref 22–32)
Calcium: 9.3 mg/dL (ref 8.9–10.3)
Chloride: 106 mmol/L (ref 98–111)
Creatinine, Ser: 0.6 mg/dL — ABNORMAL LOW (ref 0.61–1.24)
GFR calc Af Amer: >60 mL/min (ref >60)
GFR calc non Af Amer: >60 mL/min (ref >60)
Glucose, Bld: 113 mg/dL — ABNORMAL HIGH (ref 70–99)
Potassium: 3.6 mmol/L (ref 3.5–5.1)
Sodium: 143 mmol/L (ref 135–145)

## 2018-04-22 LAB — CBC
HCT: 43.9 % (ref 39.0–52.0)
Hemoglobin: 14.3 g/dL (ref 13.0–17.0)
MCH: 28.8 pg (ref 26.0–34.0)
MCHC: 32.6 g/dL (ref 30.0–36.0)
MCV: 88.3 fL (ref 78.0–100.0)
Platelets: 231 10*3/uL (ref 150–400)
RBC: 4.97 MIL/uL (ref 4.22–5.81)
RDW: 13.2 % (ref 11.5–15.5)
WBC: 10 10*3/uL (ref 4.0–10.5)

## 2018-04-22 LAB — URINE CULTURE

## 2018-04-22 MED ORDER — LISINOPRIL-HYDROCHLOROTHIAZIDE 10-12.5 MG PO TABS: 0.5000 | ORAL_TABLET | ORAL | Status: DC

## 2018-04-22 MED ORDER — HYDROCHLOROTHIAZIDE 10 MG/ML ORAL SUSPENSION
6.2500 mg | ORAL | Status: DC
  Administered 2018-04-22: 6.25 mg via ORAL
  Filled 2018-04-22 (×2): qty 1.25

## 2018-04-22 MED ORDER — LISINOPRIL 5 MG PO TABS
5.0000 mg | ORAL_TABLET | ORAL | Status: DC
  Administered 2018-04-22: 5 mg via ORAL
  Filled 2018-04-22: qty 1

## 2018-04-22 MED ORDER — ENSURE ENLIVE PO LIQD
237.0000 mL | Freq: Two times a day (BID) | ORAL | Status: DC
  Administered 2018-04-22 – 2018-04-23 (×2): 237 mL via ORAL

## 2018-04-22 MED ORDER — POLYETHYLENE GLYCOL 3350 17 G PO PACK
17.0000 g | PACK | Freq: Every day | ORAL | Status: DC
  Administered 2018-04-22 – 2018-04-23 (×2): 17 g via ORAL
  Filled 2018-04-22 (×2): qty 1

## 2018-04-22 MED ORDER — SODIUM CHLORIDE 0.9 % IV SOLN
1.0000 g | INTRAVENOUS | Status: DC
  Administered 2018-04-22: 1 g via INTRAVENOUS
  Filled 2018-04-22: qty 10
  Filled 2018-04-22: qty 1

## 2018-04-22 NOTE — Progress Notes (Signed)
PROGRESS NOTE    Samuel [name]  CHE:[id number] DOB: 07-Apr-1961 DOA: 04/20/2018 PCP: Barbie Banner, MD   Brief Narrative: Samuel [name] is a 57 y.o. male with kidney stones,relapsing remitting multiple sclerosis followed by neurology, multiple contractures, neurogenic bladder status post suprapubic catheter, hypertension and hypothyroidism. Patient presented secondary to fatigue, nausea, hypoxia, found to have a urinary tract infection.   Assessment & Plan:   Principal Problem:   Acute lower UTI Active Problems:   Multiple sclerosis (HCC)   Spastic hemiplegia affecting left nondominant side (HCC)   Neurogenic bladder   Malnutrition of moderate degree (HCC)   Essential hypertension   Sepsis (HCC)   Pressure injury of skin   Acute lower UTI History of ESBL, however, last urine culture significant for pan-sensitive E. Coli. Urine culture with multiple species -Switch from meropenem to Ceftriaxone  Multiple sclerosis Spastic hemiplegia. Stable  Moderate malnutrition -Dietitian consulted  Hypothyroidism -Continue Synthroid  Anxiety/Depression Continue Trazodone and Effexor  Hypertension -Continue amlodipine, lisinopril, hydrochlorothiazide   DVT prophylaxis: SCDs Code Status:   Code Status: Full Code Family Communication: None at bedside Disposition Plan: Discharge likely in 24 hours pending blood cultures   Consultants:   None  Procedures:   None  Antimicrobials:  Meropenem  Ceftriaxone    Subjective: Feels better than admission.  Objective: Vitals:   04/21/18 2026 04/22/18 0029 04/22/18 0453 04/22/18 1307  BP: (!) 158/92 (!) 144/84 (!) 172/93 (!) 141/96  Pulse: 74 91 (!) 117 81  Resp: 18 16 14 18   Temp: 99.3 F (37.4 C) 99 F (37.2 C) 98.3 F (36.8 C) 99.3 F (37.4 C)  TempSrc: Oral Oral Oral Oral  SpO2: 94% 93% 93% 96%  Weight:      Height:        Intake/Output Summary (Last 24 hours) at 04/22/2018 1747 Last data filed at  04/22/2018 1544 Gross per 24 hour  Intake 778.47 ml  Output 1700 ml  Net -921.53 ml   Filed Weights   04/21/18 0150  Weight: 69.7 kg    Examination:  General exam: Appears calm and comfortable Respiratory system: Clear to auscultation. Respiratory effort normal. Cardiovascular system: S1 & S2 heard, RRR. No murmurs, rubs, gallops or clicks. Gastrointestinal system: Abdomen is nondistended, soft and nontender. Normal bowel sounds heard. Central nervous system: Alert and oriented. No focal neurological deficits. Extremities: No edema. No calf tenderness. Contractures Skin: No cyanosis. No rashes Psychiatry: Judgement and insight appear normal. Mood & affect appropriate.     Data Reviewed: I have personally reviewed following labs and imaging studies  CBC: Recent Labs  Lab 04/20/18 1910 04/21/18 0444 04/22/18 0614  WBC 14.4* 13.3* 10.0  NEUTROABS 12.5*  --   --   HGB 16.0 15.0 14.3  HCT 47.4 45.0 43.9  MCV 86.5 87.7 88.3  PLT 276 251 231   Basic Metabolic Panel: Recent Labs  Lab 04/20/18 1910 04/21/18 0444 04/22/18 0614  NA 141 139 143  K 3.6 3.9 3.6  CL 103 103 106  CO2 26 25 28   GLUCOSE 120* 102* 113*  BUN 8 10 8   CREATININE 0.52* 0.52* 0.60*  CALCIUM 9.4 9.0 9.3  MG  --  2.0  --   PHOS  --  2.5  --    GFR: Estimated Creatinine Clearance: 100.4 mL/min (A) (by C-G formula based on SCr of 0.6 mg/dL (L)). Liver Function Tests: Recent Labs  Lab 04/20/18 1910 04/21/18 0444  AST 21 19  ALT 30 28  ALKPHOS 145* 127*  BILITOT 0.5 0.7  PROT 7.6 6.9  ALBUMIN 4.5 4.0   No results for input(s): LIPASE, AMYLASE in the last 168 hours. No results for input(s): AMMONIA in the last 168 hours. Coagulation Profile: Recent Labs  Lab 04/21/18 0202  INR 0.98   Cardiac Enzymes: No results for input(s): CKTOTAL, CKMB, CKMBINDEX, TROPONINI in the last 168 hours. BNP (last 3 results) No results for input(s): PROBNP in the last 8760 hours. HbA1C: No results for  input(s): HGBA1C in the last 72 hours. CBG: No results for input(s): GLUCAP in the last 168 hours. Lipid Profile: No results for input(s): CHOL, HDL, LDLCALC, TRIG, CHOLHDL, LDLDIRECT in the last 72 hours. Thyroid Function Tests: Recent Labs    04/21/18 0444  TSH 1.393   Anemia Panel: No results for input(s): VITAMINB12, FOLATE, FERRITIN, TIBC, IRON, RETICCTPCT in the last 72 hours. Sepsis Labs: Recent Labs  Lab 04/21/18 0202 04/21/18 0444  PROCALCITON <0.10  --   LATICACIDVEN 3.5* 1.2    Recent Results (from the past 240 hour(s))  Urine culture     Status: Abnormal   Collection Time: 04/20/18  8:31 PM  Result Value Ref Range Status   Specimen Description   Final    URINE, CLEAN CATCH Performed at [facility], [address] [place], [place] [address]    Special Requests   Final    NONE Performed at [facility], [address] [place], [place] [address]    Culture MULTIPLE SPECIES PRESENT, SUGGEST RECOLLECTION (A)  Final   Report Status 04/22/2018 FINAL  Final  Culture, blood (x 2)     Status: None (Preliminary result)   Collection Time: 04/21/18  2:02 AM  Result Value Ref Range Status   Specimen Description   Final    BLOOD LEFT HAND Performed at [facility], [address] [place], [place] [address]    Special Requests   Final    BOTTLES DRAWN AEROBIC AND ANAEROBIC Blood Culture adequate volume Performed at [facility], [address], [place], [place] [address]    Culture   Final    NO GROWTH 1 DAY Performed at [facility], [address] [place], [place] [address]    Report Status PENDING  Incomplete  Culture, blood (x 2)     Status: None (Preliminary result)   Collection Time: 04/21/18  2:02 AM  Result Value Ref Range Status   Specimen Description   Final    BLOOD RIGHT HAND Performed at [facility], [address], [place], [place] [address]      Special Requests   Final    BOTTLES DRAWN AEROBIC AND ANAEROBIC Blood Culture adequate volume Performed at [facility], [address] [place], [place] [address]    Culture   Final    NO GROWTH 1 DAY Performed at [facility], [address] [place], [place] [address]    Report Status PENDING  Incomplete         Radiology Studies: Ct Abdomen Pelvis Wo Contrast  Result Date: 04/21/2018 CLINICAL DATA:  Flank pain, recurrent stone disease suspected. EXAM: CT ABDOMEN AND PELVIS WITHOUT CONTRAST TECHNIQUE: Multidetector CT imaging of the abdomen and pelvis was performed following the standard protocol without IV contrast. COMPARISON:  CT 12/10/2017 FINDINGS: Lower chest: Trace pleural effusions with mild improvement from prior exam. Lower lobe scarring or atelectasis. Calcified granuloma in the right lower  lobe. Hepatobiliary: Decreased hepatic density suggesting steatosis. No discrete focal lesion. Gallbladder physiologically distended, no calcified stone. No biliary dilatation. Pancreas: Fatty atrophy.  No ductal dilatation or inflammation. Spleen: Normal in size without focal abnormality. Small splenule superiorly. Adrenals/Urinary Tract: No adrenal nodule. No hydronephrosis. Decreased stone burden in the right kidney from prior exam with small nonobstructing stones in the upper pole largest measuring 7 mm. No ureteral calculi. Mild bilateral perinephric edema, improved on the right from prior exam. Suprapubic catheter decompresses the urinary bladder. Possible punctate stone in the right urinary trigone. Stomach/Bowel: Moderate stool in the ascending and rectosigmoid colon. Mild rectal wall thickening. Perirectal stranding is unchanged from prior exam. No bowel obstruction or inflammation. Appendix not visualized, surgically absent per history. Stomach physiologically distended. Vascular/Lymphatic: Mild aortic and iliac atherosclerosis. No aneurysm. No abdominopelvic  adenopathy. Reproductive: Prostate is unremarkable. Other: No free air, free fluid or intra-abdominal abscess. Tiny fat containing umbilical hernia. Musculoskeletal: There are no acute or suspicious osseous abnormalities. The bones are under mineralized. IMPRESSION: 1. No hydronephrosis. Nonobstructing stones in the upper right kidney with decreased stone burden from prior CT. 2. Possible punctate stone in the right urinary trigone without back pressure changes. 3. Moderate stool burden, mild rectal wall thickening with increased stool in the rectum, suggesting chronic constipation. 4. Mild hepatic steatosis. Electronically Signed   By: Rubye Oaks M.D.   On: 04/21/2018 00:05   Dg Chest 2 View  Result Date: 04/20/2018 CLINICAL DATA:  Shortness of breath low oxygen saturation EXAM: CHEST - 2 VIEW COMPARISON:  12/31/2017, 12/29/2017, 12/09/2017 FINDINGS: Mildly low lung volumes. No focal airspace disease or pleural effusion. The cardiomediastinal silhouette is within normal limits. No pneumothorax is seen. IMPRESSION: No active cardiopulmonary disease. Electronically Signed   By: Jasmine Pang M.D.   On: 04/20/2018 19:40        Scheduled Meds: . amLODipine  5 mg Oral Daily  . baclofen  40 mg Oral TID  . feeding supplement (ENSURE ENLIVE)  237 mL Oral BID BM  . Gerhardt's butt cream   Topical TID  . lisinopril  5 mg Oral QODAY   And  . hydrochlorothiazide  6.25 mg Oral QODAY  . levothyroxine  125 mcg Oral QAC breakfast  . mouth rinse  15 mL Mouth Rinse BID  . oxybutynin  15 mg Oral [id number]  . perphenazine  2 mg Oral BID  . polyethylene glycol  17 g Oral Daily  . senna  1 tablet Oral BID  . tamsulosin  0.4 mg Oral Daily  . trazodone  300 mg Oral QHS  . venlafaxine XR  150 mg Oral Q breakfast   Continuous Infusions: . cefTRIAXone (ROCEPHIN)  IV 200 mL/hr at 04/22/18 1544  . sodium chloride       LOS: 2 days     Jacquelin Hawking, MD [facility] 04/22/2018, 5:47 PM Pager:  [phone]  If 7PM-7AM, please contact night-coverage [email or web address] 04/22/2018, 5:47 PM

## 2018-04-22 NOTE — Progress Notes (Signed)
Initial Nutrition Assessment  INTERVENTION:   Provide Ensure Enlive po BID, each supplement provides 350 kcal and 20 grams of protein  NUTRITION DIAGNOSIS:   Increased nutrient needs related to wound healing as evidenced by estimated needs.  GOAL:   Patient will meet greater than or equal to 90% of their needs  MONITOR:   PO intake, Supplement acceptance, Labs, Weight trends, Skin, I & O's  REASON FOR ASSESSMENT:   Malnutrition Screening Tool    ASSESSMENT:   57 year old male with kidney stones, relapsing remitting multiple sclerosis followed by neurology, multiple contractures, neurogenic bladder status post suprapubic catheter, hypertension and hypothyroidism presented with 3-day history of fatigue, decrease in appetite, nausea, hypoxia at home down to 91% on 4 L, low-grade temp of 100.2 F.  Patient reports eating a good breakfast, completed ~50%. Pt states his appetite has been poor and he has been feeling some nausea for a few days PTA. States his mother will try and encourage him to eat anyway. He typically drinks a Ensure supplement in the morning with medications and then he may have an additional supplement in the afternoon. He is amenable to having Ensure ordered this admission.   Pt reports UBW was 170 lb the last time he was admitted to the hospital. States over a year ago he weighed 120 lb. Pt has lost 7 lb since 4/11 (4% wt loss x 4 months, insignificant for time frame).  Labs reviewed. Medications: Miralax packet daily, Senokot tablet BID, Zofran PRN  NUTRITION - FOCUSED PHYSICAL EXAM:  Due to pt's quadriplegia and MS, it is difficult to determine whether fat or muscle loss is related to suboptimal nutrition status vs limited mobility.    Most Recent Value  Orbital Region  Mild depletion  Upper Arm Region  Moderate depletion  Thoracic and Lumbar Region  Unable to assess  Buccal Region  No depletion  Temple Region  Moderate depletion  Clavicle Bone Region   Mild depletion  Clavicle and Acromion Bone Region  Mild depletion  Scapular Bone Region  Unable to assess  Dorsal Hand  Unable to assess  Patellar Region  Unable to assess  Anterior Thigh Region  Unable to assess  Posterior Calf Region  Unable to assess  Edema (RD Assessment)  None       Diet Order:   Diet Order            Diet Heart Room service appropriate? Yes; Fluid consistency: Thin  Diet effective now              EDUCATION NEEDS:   Education needs have been addressed  Skin:  Skin Assessment: Skin Integrity Issues: Skin Integrity Issues:: Stage II Stage II: buttocks  Last BM:  PTA  Height:   Ht Readings from Last 1 Encounters:  04/21/18 6' 1.5" (1.867 m)    Weight:   Wt Readings from Last 1 Encounters:  04/21/18 69.7 kg    Ideal Body Weight:  86.4 kg  BMI:  Body mass index is 20 kg/m.  Estimated Nutritional Needs:   Kcal:  1700-1900  Protein:  80-90g  Fluid:  1.9L/day   Tilda Franco, MS, RD, LDN [facility] Inpatient Clinical Dietitian Pager: [phone] After Hours Pager: [phone]

## 2018-04-23 LAB — BASIC METABOLIC PANEL
Anion gap: 8 (ref 5–15)
BUN: 8 mg/dL (ref 6–20)
CO2: 31 mmol/L (ref 22–32)
Calcium: 9.8 mg/dL (ref 8.9–10.3)
Chloride: 103 mmol/L (ref 98–111)
Creatinine, Ser: 0.64 mg/dL (ref 0.61–1.24)
GFR calc Af Amer: >60 mL/min (ref >60)
GFR calc non Af Amer: >60 mL/min (ref >60)
Glucose, Bld: 105 mg/dL — ABNORMAL HIGH (ref 70–99)
Potassium: 3.4 mmol/L — ABNORMAL LOW (ref 3.5–5.1)
Sodium: 142 mmol/L (ref 135–145)

## 2018-04-23 LAB — CBC
HCT: 46.2 % (ref 39.0–52.0)
Hemoglobin: 15.1 g/dL (ref 13.0–17.0)
MCH: 28.9 pg (ref 26.0–34.0)
MCHC: 32.7 g/dL (ref 30.0–36.0)
MCV: 88.3 fL (ref 78.0–100.0)
Platelets: 243 10*3/uL (ref 150–400)
RBC: 5.23 MIL/uL (ref 4.22–5.81)
RDW: 13.2 % (ref 11.5–15.5)
WBC: 9.8 10*3/uL (ref 4.0–10.5)

## 2018-04-23 MED ORDER — LISINOPRIL-HYDROCHLOROTHIAZIDE 10-12.5 MG PO TABS: 0.5000 | ORAL_TABLET | Freq: Every day | ORAL | Status: DC

## 2018-04-23 MED ORDER — POTASSIUM CHLORIDE CRYS ER 20 MEQ PO TBCR
40.0000 meq | EXTENDED_RELEASE_TABLET | Freq: Once | ORAL | Status: AC
  Administered 2018-04-23: 40 meq via ORAL
  Filled 2018-04-23: qty 2

## 2018-04-23 MED ORDER — POLYVINYL ALCOHOL 1.4 % OP SOLN
1.0000 [drp] | OPHTHALMIC | Status: DC | PRN
  Administered 2018-04-23: 1 [drp] via OPHTHALMIC
  Filled 2018-04-23: qty 15

## 2018-04-23 MED ORDER — POLYETHYLENE GLYCOL 3350 17 G PO PACK: 17.0000 g | PACK | Freq: Every day | ORAL | 0 refills | Status: AC

## 2018-04-23 MED ORDER — GERHARDT'S BUTT CREAM: 1.0000 "application " | TOPICAL_CREAM | Freq: Three times a day (TID) | CUTANEOUS | 0 refills | Status: DC

## 2018-04-23 MED ORDER — FOSFOMYCIN TROMETHAMINE 3 G PO PACK: 3.0000 g | PACK | Freq: Once | ORAL | 0 refills | Status: AC

## 2018-04-23 MED ORDER — FOSFOMYCIN TROMETHAMINE 3 G PO PACK
3.0000 g | PACK | Freq: Once | ORAL | Status: AC
  Administered 2018-04-23: 3 g via ORAL
  Filled 2018-04-23: qty 3

## 2018-04-23 NOTE — Care Management Important Message (Addendum)
Important Message  Patient Details IM Letter given to Kathy/Care Manager to present to the Patient Name: Samuel [name] MRN: [id number] Date of Birth: 09-20-1960   Medicare Important Message Given:  Yes    Caren Macadam 04/23/2018, 12:26 PMImportant Message  Patient Details  Name: Samuel [name] MRN: [id number] Date of Birth: 03/13/1961   Medicare Important Message Given:  Yes    Caren Macadam 04/23/2018, 12:26 PM

## 2018-04-23 NOTE — Discharge Summary (Addendum)
Physician Discharge Summary  Samuel [name]:[id number] DOB: Mar 07, 1961 DOA: 04/20/2018  PCP: Barbie Banner, MD  Admit date: 04/20/2018 Discharge date: 04/23/2018  Admitted From: Home Disposition: Home  Recommendations for Outpatient Follow-up:  1. Follow up with PCP in 1 week 2. Please follow up on the following pending results: Blood cultures   Home Health: None Equipment/Devices: None  Discharge Condition: Stable CODE STATUS: Full code Diet recommendation: Heart healthy   Brief/Interim Summary:  Admission HPI written by Therisa Doyne, MD   HPI: Samuel [name] is a 57 y.o. male with medical history significant of kidney stones, relapsing remitting multiple sclerosis followed by neurology, multiple contractures, neurogenic bladder status post suprapubic catheter, hypertension and hypothyroidism  Presented with 3-day history of fatigue decrease in appetite, nausea similar to prior presentation with kidney stone but milder.  is noted to be hypoxic at home down to 91% after 4 L improved to 96 noted to have low-grade fever up to 100.2 Recently admitted (from April 29 to May 2 )with ureteral calculus status post right ureteroscopy with laser lithotripsy and ureteral stent placement.  Complicated by postoperative respiratory distress and sepsis requiring admission to ICU and initially on broad-spectrum antibiotics. Respiratory status improved patient defervesced.He was transitioned over to ciprofloxacin and was discharged on ciprofloxacin for 7 days. 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  At that time he was noted to have a large 11 mm right renal calculi and a stent placement Discharge to home on cephalexin after sensitivities came back   Pt  at baseline bed bound his sister able to move him twice a day,    Hospital course:  Acute lower UTI Sepsis secondary to UTI History of ESBL. Urine culture with multiple species. Empirically treated with meropenem which was switched to ceftriaxone. Patient started on fosfomycin, secondary to multiple species and complicated microbiology history. One dose given prior to discharge and one more dose due as an outpatient.  Multiple sclerosis Spastic hemiplegia. Stable  Moderate malnutrition Dietitian consulted. Protein supplementation.  Hypothyroidism Continued Synthroid  Anxiety/Depression Continued Trazodone and Effexor  Hypertension Continued home lisinopril/hctz. Used amlodipine while inpatient while patient was off of home lisinopril/hctz.   Discharge Diagnoses:  Principal Problem:   Acute lower UTI Active Problems:   Multiple sclerosis (HCC)   Spastic hemiplegia affecting left nondominant side (HCC)   Neurogenic bladder   Malnutrition of moderate degree (HCC)   Essential hypertension   Sepsis (HCC)   Pressure injury of skin    Discharge Instructions  Discharge Instructions    Diet - low sodium heart healthy   Complete by:  As directed      Allergies as of 04/23/2018      Reactions   Penicillins Other (See Comments)   Unknown childhoood reaction (takes amoxicillian with reaction) Has tolerated ceftriaxone, cefepime, and Primaxin during inpatient admissions   Primaxin [imipenem] Other (See Comments)   Seizure       Medication List    STOP taking these medications   scopolamine 1 MG/3DAYS Commonly known as:  TRANSDERM-SCOP     TAKE these medications   acetic acid 0.25 % irrigation Irrigate with as directed daily. Instill 30ml and clamp tube for 30 minutes then drain.   AZO-CRANBERRY PO Take  1 tablet by mouth 2 (two) times daily.   baclofen 20 MG tablet Commonly known as:  LIORESAL TAKE 2 TABLETS BY MOUTH THREE TIMES DAILY   diazepam 5 MG  tablet Commonly known as:  VALIUM Take 1 tablet (5 mg total) by mouth 3 (three) times daily.   docusate sodium 250 MG capsule Commonly known as:  COLACE Take 250 mg by mouth daily as needed for constipation.   fosfomycin 3 g Pack Commonly known as:  MONUROL Take 3 g by mouth once for 1 dose. Start taking on:  04/25/2018   Gerhardt's butt cream Crea Apply 1 application topically 3 (three) times daily.   HYDROcodone-acetaminophen 5-325 MG tablet Commonly known as:  NORCO/VICODIN Take 1 tablet by mouth every 6 (six) hours as needed.   levothyroxine 125 MCG tablet Commonly known as:  SYNTHROID, LEVOTHROID Take 125 mcg by mouth daily.   lisinopril-hydrochlorothiazide 10-12.5 MG tablet Commonly known as:  PRINZIDE,ZESTORETIC Take 0.5 tablets by mouth daily. What changed:  when to take this   omeprazole 20 MG capsule Commonly known as:  PRILOSEC Take 20 mg by mouth every morning.   oxybutynin 15 MG 24 hr tablet Commonly known as:  DITROPAN XL Take 15 mg by mouth every morning.   perphenazine 2 MG tablet Commonly known as:  TRILAFON TAKE 1 TABLET BY MOUTH TWICE DAILY   polyethylene glycol packet Commonly known as:  MIRALAX / GLYCOLAX Take 17 g by mouth daily. Start taking on:  04/24/2018   tamsulosin 0.4 MG Caps capsule Commonly known as:  FLOMAX Take 1 capsule (0.4 mg total) by mouth daily.   tiZANidine 4 MG tablet Commonly known as:  ZANAFLEX TAKE 2 TABLETS BY MOUTH THREE TIMES DAILY PLEASE  CALL  [phone]  TO  SCHEDULE  AN  APPT What changed:  See the new instructions.   traMADol 50 MG tablet Commonly known as:  ULTRAM TAKE 1 TABLET BY MOUTH EVERY 6 HOURS AS NEEDED What changed:  reasons to take this   trazodone 300 MG tablet Commonly known as:  DESYREL TAKE 1 TABLET BY MOUTH ONCE DAILY AT BEDTIME   venlafaxine XR 75 MG 24 hr capsule Commonly known as:  EFFEXOR-XR TAKE 2 CAPSULES BY MOUTH ONCE DAILY WITH BREAKFAST What changed:  See the new instructions.     Vitamin D (Ergocalciferol) 50000 units Caps capsule Commonly known as:  DRISDOL Take 50,000 Units by mouth every 7 (seven) days. Friday only       Allergies  Allergen Reactions  . Penicillins Other (See Comments)    Unknown childhoood reaction (takes amoxicillian with reaction) Has tolerated ceftriaxone, cefepime, and Primaxin during inpatient admissions  . Primaxin [Imipenem] Other (See Comments)    Seizure     Consultations:  None   Procedures/Studies: Ct Abdomen Pelvis Wo Contrast  Result Date: 04/21/2018 CLINICAL DATA:  Flank pain, recurrent stone disease suspected. EXAM: CT ABDOMEN AND PELVIS WITHOUT CONTRAST TECHNIQUE: Multidetector CT imaging of the abdomen and pelvis was performed following the standard protocol without IV contrast. COMPARISON:  CT 12/10/2017 FINDINGS: Lower chest: Trace pleural effusions with mild improvement from prior exam. Lower lobe scarring or atelectasis. Calcified granuloma in the right lower lobe. Hepatobiliary: Decreased hepatic density suggesting steatosis. No discrete focal lesion. Gallbladder physiologically distended, no calcified stone. No biliary dilatation. Pancreas: Fatty atrophy.  No ductal dilatation or inflammation. Spleen: Normal in size without focal abnormality. Small splenule superiorly. Adrenals/Urinary Tract: No adrenal nodule. No hydronephrosis. Decreased stone burden in the right kidney  from prior exam with small nonobstructing stones in the upper pole largest measuring 7 mm. No ureteral calculi. Mild bilateral perinephric edema, improved on the right from prior exam. Suprapubic catheter decompresses the urinary bladder. Possible punctate stone in the right urinary trigone. Stomach/Bowel: Moderate stool in the ascending and rectosigmoid colon. Mild rectal wall thickening. Perirectal stranding is unchanged from prior exam. No bowel obstruction or inflammation. Appendix not visualized, surgically absent per history. Stomach physiologically  distended. Vascular/Lymphatic: Mild aortic and iliac atherosclerosis. No aneurysm. No abdominopelvic adenopathy. Reproductive: Prostate is unremarkable. Other: No free air, free fluid or intra-abdominal abscess. Tiny fat containing umbilical hernia. Musculoskeletal: There are no acute or suspicious osseous abnormalities. The bones are under mineralized. IMPRESSION: 1. No hydronephrosis. Nonobstructing stones in the upper right kidney with decreased stone burden from prior CT. 2. Possible punctate stone in the right urinary trigone without back pressure changes. 3. Moderate stool burden, mild rectal wall thickening with increased stool in the rectum, suggesting chronic constipation. 4. Mild hepatic steatosis. Electronically Signed   By: Rubye Oaks M.D.   On: 04/21/2018 00:05   Dg Chest 2 View  Result Date: 04/20/2018 CLINICAL DATA:  Shortness of breath low oxygen saturation EXAM: CHEST - 2 VIEW COMPARISON:  12/31/2017, 12/29/2017, 12/09/2017 FINDINGS: Mildly low lung volumes. No focal airspace disease or pleural effusion. The cardiomediastinal silhouette is within normal limits. No pneumothorax is seen. IMPRESSION: No active cardiopulmonary disease. Electronically Signed   By: Jasmine Pang M.D.   On: 04/20/2018 19:40      Subjective: No concerns today.  Discharge Exam: Vitals:   04/23/18 0559 04/23/18 0610  BP: (!) 146/89 (!) 146/92  Pulse: 76 81  Resp: 16 20  Temp: 99.5 F (37.5 C) 99.4 F (37.4 C)  SpO2: 93% 93%   Vitals:   04/22/18 2226 04/23/18 0214 04/23/18 0559 04/23/18 0610  BP: (!) 161/91 (!) 158/89 (!) 146/89 (!) 146/92  Pulse: (!) 102 80 76 81  Resp: 20 16 16 20   Temp: 100.3 F (37.9 C) 99.5 F (37.5 C) 99.5 F (37.5 C) 99.4 F (37.4 C)  TempSrc: Oral Oral Oral Oral  SpO2: 95% 93% 93% 93%  Weight:      Height:        General: Pt is alert, awake, not in acute distress    The results of significant diagnostics from this hospitalization (including imaging,  microbiology, ancillary and laboratory) are listed below for reference.     Microbiology: Recent Results (from the past 240 hour(s))  Urine culture     Status: Abnormal   Collection Time: 04/20/18  8:31 PM  Result Value Ref Range Status   Specimen Description   Final    URINE, CLEAN CATCH Performed at [facility], [address] [place], [place] [address]    Special Requests   Final    NONE Performed at [facility], [address], [place], [place] [address]    Culture MULTIPLE SPECIES PRESENT, SUGGEST RECOLLECTION (A)  Final   Report Status 04/22/2018 FINAL  Final  Culture, blood (x 2)     Status: None (Preliminary result)   Collection Time: 04/21/18  2:02 AM  Result Value Ref Range Status   Specimen Description   Final    BLOOD LEFT HAND Performed at [facility], [address], [place], [place] [address]    Special Requests   Final    BOTTLES DRAWN AEROBIC AND ANAEROBIC Blood Culture adequate volume Performed at  [facility], [address], [place], [place] [address]    Culture   Final    NO GROWTH 2 DAYS Performed at [facility], [address] [place], [place] [address]    Report Status PENDING  Incomplete  Culture, blood (x 2)     Status: None (Preliminary result)   Collection Time: 04/21/18  2:02 AM  Result Value Ref Range Status   Specimen Description   Final    BLOOD RIGHT HAND Performed at [facility], [address] [place], [place] [address]    Special Requests   Final    BOTTLES DRAWN AEROBIC AND ANAEROBIC Blood Culture adequate volume Performed at [facility], [address], [place], [place] [address]    Culture   Final    NO GROWTH 2 DAYS Performed at [facility], [address] [place], [place] [address]    Report Status PENDING  Incomplete     Labs: BNP (last 3 results) No results for input(s): BNP in the last  8760 hours. Basic Metabolic Panel: Recent Labs  Lab 04/20/18 1910 04/21/18 0444 04/22/18 0614 04/23/18 0555  NA 141 139 143 142  K 3.6 3.9 3.6 3.4*  CL 103 103 106 103  CO2 26 25 28 31   GLUCOSE 120* 102* 113* 105*  BUN 8 10 8 8   CREATININE 0.52* 0.52* 0.60* 0.64  CALCIUM 9.4 9.0 9.3 9.8  MG  --  2.0  --   --   PHOS  --  2.5  --   --    Liver Function Tests: Recent Labs  Lab 04/20/18 1910 04/21/18 0444  AST 21 19  ALT 30 28  ALKPHOS 145* 127*  BILITOT 0.5 0.7  PROT 7.6 6.9  ALBUMIN 4.5 4.0   No results for input(s): LIPASE, AMYLASE in the last 168 hours. No results for input(s): AMMONIA in the last 168 hours. CBC: Recent Labs  Lab 04/20/18 1910 04/21/18 0444 04/22/18 0614 04/23/18 0555  WBC 14.4* 13.3* 10.0 9.8  NEUTROABS 12.5*  --   --   --   HGB 16.0 15.0 14.3 15.1  HCT 47.4 45.0 43.9 46.2  MCV 86.5 87.7 88.3 88.3  PLT 276 251 231 243   Cardiac Enzymes: No results for input(s): CKTOTAL, CKMB, CKMBINDEX, TROPONINI in the last 168 hours. BNP: Invalid input(s): POCBNP CBG: No results for input(s): GLUCAP in the last 168 hours. D-Dimer No results for input(s): DDIMER in the last 72 hours. Hgb A1c No results for input(s): HGBA1C in the last 72 hours. Lipid Profile No results for input(s): CHOL, HDL, LDLCALC, TRIG, CHOLHDL, LDLDIRECT in the last 72 hours. Thyroid function studies Recent Labs    04/21/18 0444  TSH 1.393   Anemia work up No results for input(s): VITAMINB12, FOLATE, FERRITIN, TIBC, IRON, RETICCTPCT in the last 72 hours. Urinalysis    Component Value Date/Time   COLORURINE YELLOW 04/20/2018 2031   APPEARANCEUR CLOUDY (A) 04/20/2018 2031   LABSPEC 1.009 04/20/2018 2031   PHURINE 6.0 04/20/2018 2031   GLUCOSEU NEGATIVE 04/20/2018 2031   HGBUR MODERATE (A) 04/20/2018 2031   BILIRUBINUR NEGATIVE 04/20/2018 2031   KETONESUR 5 (A) 04/20/2018 2031   PROTEINUR 30 (A) 04/20/2018 2031   UROBILINOGEN 0.2 06/02/2015 0459   NITRITE POSITIVE  (A) 04/20/2018 2031   LEUKOCYTESUR LARGE (A) 04/20/2018 2031   Sepsis Labs Invalid input(s): PROCALCITONIN,  WBC,  LACTICIDVEN Microbiology Recent Results (from the past 240 hour(s))  Urine culture     Status: Abnormal   Collection Time: 04/20/18  8:31 PM  Result Value Ref Range Status   Specimen Description   Final    URINE, CLEAN CATCH Performed at [facility], [address] [place], [place] [address]    Special Requests   Final    NONE Performed at [facility], [address], [place], [place] [address]    Culture MULTIPLE SPECIES PRESENT, SUGGEST RECOLLECTION (A)  Final   Report Status 04/22/2018 FINAL  Final  Culture, blood (x 2)     Status: None (Preliminary result)   Collection Time: 04/21/18  2:02 AM  Result Value Ref Range Status   Specimen Description   Final    BLOOD LEFT HAND Performed at [facility], [address], [place], [place] [address]    Special Requests   Final    BOTTLES DRAWN AEROBIC AND ANAEROBIC Blood Culture adequate volume Performed at [facility], [address] [place], [place] [address]    Culture   Final    NO GROWTH 2 DAYS Performed at [facility], [address], [place], [place] [address]    Report Status PENDING  Incomplete  Culture, blood (x 2)     Status: None (Preliminary result)   Collection Time: 04/21/18  2:02 AM  Result Value Ref Range Status   Specimen Description   Final    BLOOD RIGHT HAND Performed at [facility], [address] [place], [place] [address]    Special Requests   Final    BOTTLES DRAWN AEROBIC AND ANAEROBIC Blood Culture adequate volume Performed at [facility], [address], [place], [place] [address]    Culture   Final    NO GROWTH 2 DAYS Performed at [facility], [address] [place], [place] [address]    Report Status PENDING  Incomplete      SIGNED:   Jacquelin Hawking, MD [facility] 04/23/2018, 12:55 PM

## 2018-04-23 NOTE — Discharge Instructions (Signed)
Urinary Tract Infection, Adult  A urinary tract infection (UTI) is an infection of any part of the urinary tract, which includes the kidneys, ureters, bladder, and urethra. These organs make, store, and get rid of urine in the body. UTI can be a bladder infection (cystitis) or kidney infection (pyelonephritis).  What are the causes?  This infection may be caused by fungi, viruses, or bacteria. Bacteria are the most common cause of UTIs. This condition can also be caused by repeated incomplete emptying of the bladder during urination.  What increases the risk?  This condition is more likely to develop if:   You ignore your need to urinate or hold urine for long periods of time.   You do not empty your bladder completely during urination.   You wipe back to front after urinating or having a bowel movement, if you are male.   You are uncircumcised, if you are male.   You are constipated.   You have a urinary catheter that stays in place (indwelling).   You have a weak defense (immune) system.   You have a medical condition that affects your bowels, kidneys, or bladder.   You have diabetes.   You take antibiotic medicines frequently or for long periods of time, and the antibiotics no longer work well against certain types of infections (antibiotic resistance).   You take medicines that irritate your urinary tract.   You are exposed to chemicals that irritate your urinary tract.   You are male.    What are the signs or symptoms?  Symptoms of this condition include:   Fever.   Frequent urination or passing small amounts of urine frequently.   Needing to urinate urgently.   Pain or burning with urination.   Urine that smells bad or unusual.   Cloudy urine.   Pain in the lower abdomen or back.   Trouble urinating.   Blood in the urine.   Vomiting or being less hungry than normal.   Diarrhea or abdominal pain.   Vaginal discharge, if you are male.    How is this diagnosed?  This condition is  diagnosed with a medical history and physical exam. You will also need to provide a urine sample to test your urine. Other tests may be done, including:   Blood tests.   Sexually transmitted disease (STD) testing.    If you have had more than one UTI, a cystoscopy or imaging studies may be done to determine the cause of the infections.  How is this treated?  Treatment for this condition often includes a combination of two or more of the following:   Antibiotic medicine.   Other medicines to treat less common causes of UTI.   Over-the-counter medicines to treat pain.   Drinking enough water to stay hydrated.    Follow these instructions at home:   Take over-the-counter and prescription medicines only as told by your health care provider.   If you were prescribed an antibiotic, take it as told by your health care provider. Do not stop taking the antibiotic even if you start to feel better.   Avoid alcohol, caffeine, tea, and carbonated beverages. They can irritate your bladder.   Drink enough fluid to keep your urine clear or pale yellow.   Keep all follow-up visits as told by your health care provider. This is important.   Make sure to:  ? Empty your bladder often and completely. Do not hold urine for long periods of time.  ?   Empty your bladder before and after sex.  ? Wipe from front to back after a bowel movement if you are male. Use each tissue one time when you wipe.  Contact a health care provider if:   You have back pain.   You have a fever.   You feel nauseous or vomit.   Your symptoms do not get better after 3 days.   Your symptoms go away and then return.  Get help right away if:   You have severe back pain or lower abdominal pain.   You are vomiting and cannot keep down any medicines or water.  This information is not intended to replace advice given to you by your health care provider. Make sure you discuss any questions you have with your health care provider.  Document Released:  05/29/2005 Document Revised: 01/31/2016 Document Reviewed: 07/10/2015  Elsevier Interactive Patient Education  2018 [employer] Inc[employer]

## 2018-04-23 NOTE — Care Management Note (Signed)
Case Management Note  Patient Details  Name: SAMEER WOODRING MRN: [id number] Date of Birth: 1961/07/30  Subjective/Objective:Patient from home w/sister. [facility] has addressed-air mattress leaking issue. [facility] forms on shadow chartfor Nsg to call [facility] when ready-confirmed address.No further CM needs.                    Action/Plan:d/c home/PTAR   Expected Discharge Date:                  Expected Discharge Plan:  Home/Self Care  In-House Referral:     Discharge planning Services  CM Consult  Post Acute Care Choice:    Choice offered to:     DME Arranged:    DME Agency:     HH Arranged:    HH Agency:     Status of Service:  Completed, signed off  If discussed at [employer] of Stay Meetings, dates discussed:    Additional Comments:  Lanier Clam, RN 04/23/2018, 11:53 AM

## 2018-04-25 NOTE — Progress Notes (Signed)
Call received from [name] [phone]) regarding prescription Fosfomycin for pt discharged 04/23/18 Samuel [name]. Family member stated medication was not available at pharmacy. [facility] contacted [phone] to verify, prescription is ready but $105 with insurance. Per pharmacist, Insurance provider recommended Bactrim. Will contact provider.

## 2018-04-26 LAB — CULTURE, BLOOD (ROUTINE X 2)
Culture: NO GROWTH
Culture: NO GROWTH
Special Requests: ADEQUATE
Special Requests: ADEQUATE

## 2018-04-29 ENCOUNTER — Telehealth: Payer: Self-pay | Admitting: Neurology

## 2018-04-29 MED ORDER — BUPROPION HCL ER (XL) 150 MG PO TB24: ORAL_TABLET | ORAL | 3 refills | Status: DC

## 2018-04-29 NOTE — Telephone Encounter (Signed)
Pt's sister/Donna said he was in the hospital for a bladder infection and has been home for 6 days. She said he has been depressed, feeling of hopelessness and cyring. She said he will cry for a couple of hours. She is wanting him seen sooner than September if possible. She said he was showing a few signs of this prior to going into the hospital but it has gotten worse since coming home. Please call to advise

## 2018-04-29 NOTE — Telephone Encounter (Signed)
I called the sister.  The patient has had increased problems with crying spells, depression.  The patient is on trazodone and Effexor currently, I may taper him off of Effexor and switch to Wellbutrin, he is also on perphenazine, Abilify may be used in the future.  I would consider getting a palliative care consultation out to the house through hospice if he is willing to do that.  The patient will go to 75 mg daily of Effexor for 1 week and then stop, we will start 150 mg of Wellbutrin for 1 week and then go to 300 mg.

## 2018-04-29 NOTE — Telephone Encounter (Signed)
Dr. Anne Hahn- please advise. He has appt scheduled for 05/19/18. No sooner f/u except for 730am appt we could offer.

## 2018-05-19 ENCOUNTER — Ambulatory Visit: Payer: Medicare Other | Admitting: Neurology

## 2018-05-19 ENCOUNTER — Encounter: Payer: Self-pay | Admitting: Neurology

## 2018-05-19 VITALS — BP 99/70 | HR 108 | Ht 73.5 in

## 2018-05-19 DIAGNOSIS — Z5181 Encounter for therapeutic drug level monitoring: Secondary | ICD-10-CM

## 2018-05-19 DIAGNOSIS — G8114 Spastic hemiplegia affecting left nondominant side: Secondary | ICD-10-CM | POA: Diagnosis not present

## 2018-05-19 DIAGNOSIS — G35 Multiple sclerosis: Secondary | ICD-10-CM

## 2018-05-19 DIAGNOSIS — N319 Neuromuscular dysfunction of bladder, unspecified: Secondary | ICD-10-CM | POA: Diagnosis not present

## 2018-05-19 MED ORDER — ARIPIPRAZOLE 5 MG PO TABS: 5.0000 mg | ORAL_TABLET | Freq: Every day | ORAL | 3 refills | Status: DC

## 2018-05-19 MED ORDER — TOPIRAMATE 25 MG PO TABS: ORAL_TABLET | ORAL | 3 refills | Status: DC

## 2018-05-19 NOTE — Patient Instructions (Signed)
Stop the perphenazine and start abilify.  We will start topamax for the headache.  Topamax (topiramate) is a seizure medication that has an FDA approval for seizures and for migraine headache. Potential side effects of this medication include weight loss, cognitive slowing, tingling in the fingers and toes, and carbonated drinks will taste bad. If any significant side effects are noted on this drug, please contact our office.

## 2018-05-19 NOTE — Progress Notes (Signed)
Reason for visit: Multiple sclerosis  Samuel [name] is an 57 y.o. 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  Samuel [name] is on Ocrevus, his next dose is in early October 2019.  [name] was seen in [facility] on 20 April 2018 with a urinary tract infection.  Samuel [name] is followed through urology, he wishes to switch physicians, however.  Samuel [name] has been suffering from increased problems with depression, he is having frequent crying spells.  Samuel [name] was recently placed on Wellbutrin, he has been on Samuel 300 mg dose for about 1 week.  He takes perphenazine 2 mg twice daily.  Samuel [name] is having some problems with headaches that are occurring at least twice a week, Samuel headaches are around Samuel right eye, they are responsive to Advil.  Samuel [name] has no use of his arms and legs at this point, he has to be fed.  Samuel [name] also has problems with temperature instability, he feels hot at one point and then cold Samuel next, he cannot seem to get his body temperature right.  Samuel [name] overall is relatively miserable, his family is helping to take care of him.  He is concerned about his financial situation.  Past Medical History:  Diagnosis Date  . Anxiety   . Bladder calculi   . Chronic back pain   . Chronic fatigue   . Depression   . Foley catheter in place   . Gait disorder   . GERD (gastroesophageal reflux disease)   . History of kidney stones   . Hyperlipidemia   . Hypertension   . Hypothyroidism   . Incontinence of urine   . Multiple sclerosis (HCC) NEUROLOGIST-- [name]   dx 1993, JC virus negative 2/14---  CURRENTLY RECEIVING TYSABRI IV TREATMENT  . Neurogenic bladder   . Neurogenic bowel    intermittant constipation and diarrhea  . Pneumonia    dx  03-03-2014--  admitted for iv and oral antibiotics  . 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 Urinary retention   . Vitamin D deficiency   . Yeast infection    genital area    Past Surgical History:  Procedure Laterality Date  . APPENDECTOMY  age 89  . CYSTOSCOPY N/A 03/21/2014   Procedure: CYSTOSCOPY TREATMENT OF BLADDER CALCULI;  Surgeon: [name], MD;  Location: [facility];  Service: Urology;  Laterality: N/A;  . CYSTOSCOPY WITH BIOPSY N/A 12/29/2017   Procedure: TRANSURETHRAL RESECTION OF BLADDER TUMOR;  Surgeon: [name], MD;  Location: [facility];  Service: Urology;  Laterality: N/A;  . CYSTOSCOPY WITH RETROGRADE PYELOGRAM, URETEROSCOPY AND STENT PLACEMENT Right 12/11/2017   Procedure: CYSTOSCOPY WITH RETROGRADE PYELOGRAM, URETEROSCOPY AND STENT PLACEMENT RIGHT;  Surgeon: [name], MD;  Location: [facility];  Service: Urology;  Laterality: Right;  . CYSTOSCOPY WITH RETROGRADE PYELOGRAM, URETEROSCOPY AND STENT PLACEMENT Right 12/29/2017   Procedure: CYSTOSCOPY WITH RIGHT  RETROGRADE PYELOGRAM, URETEROSCOPY/LASER LITHOTRIPSY AND STENT PLACEMENT;  Surgeon: [name], MD;  Location: [facility];  Service: Urology;  Laterality: Right;  . INSERTION OF SUPRAPUBIC CATHETER N/A 03/21/2014   Procedure: INSERTION OF SUPRAPUBIC CATHETER;  Surgeon: [name], MD;  Location: [facility];  Service: Urology;  Laterality: N/A;  . NASAL SEPTUM SURGERY  1996  . NEGATIVE SLEEP STUDY  1996  . TRANSTHORACIC ECHOCARDIOGRAM  02-07-2004   MILD LVH/  EF 55-65%    Family History  Problem Relation Age of Onset  . Stroke Father   . Cirrhosis Brother   . Hypothyroidism Sister     Social history:  reports that he quit smoking about 32 years ago. His smoking use included cigarettes. He has a 12.00 pack-year smoking history. He has never used smokeless tobacco. He reports that he does not drink alcohol or use drugs.    Allergies  Allergen Reactions  . Penicillins Other (See Comments)    Unknown childhoood reaction (takes amoxicillian with  reaction) Has tolerated ceftriaxone, cefepime, and Primaxin during inpatient admissions  . Primaxin [Imipenem] Other (See Comments)    Seizure     Medications:  Prior to Admission medications   Medication Sig Start Date End Date Taking? Authorizing Provider  acetic acid 0.25 % irrigation Irrigate with as directed daily. Instill 30ml and clamp tube for 30 minutes then drain. 06/03/15  Yes [name], MD  AZO-CRANBERRY PO Take 1 tablet by mouth 2 (two) times daily.   Yes [provider]  baclofen (LIORESAL) 20 MG tablet TAKE 2 TABLETS BY MOUTH THREE TIMES DAILY 04/20/18  Yes York Spaniel, MD  buPROPion (WELLBUTRIN XL) 150 MG 24 hr tablet 1 tablet daily for a week, then take 2 daily 04/29/18  Yes York Spaniel, MD  diazepam (VALIUM) 5 MG tablet Take 1 tablet (5 mg total) by mouth 3 (three) times daily. 11/27/17  Yes York Spaniel, MD  docusate sodium (COLACE) 250 MG capsule Take 250 mg by mouth daily as needed for constipation.    Yes [provider]  Hydrocortisone (GERHARDT'S BUTT CREAM) CREA Apply 1 application topically 3 (three) times daily. 04/23/18  Yes [name], MD  levothyroxine (SYNTHROID, LEVOTHROID) 125 MCG tablet Take 125 mcg by mouth daily. 05/22/15  Yes [provider]  lisinopril-hydrochlorothiazide (PRINZIDE,ZESTORETIC) 10-12.5 MG tablet Take 0.5 tablets by mouth daily. 04/23/18  Yes [name], MD  omeprazole (PRILOSEC) 20 MG capsule Take 20 mg by mouth every morning.    Yes [provider]  oxybutynin (DITROPAN XL) 15 MG 24 hr tablet Take 15 mg by mouth every morning.  03/30/18  Yes [provider]  polyethylene glycol (MIRALAX / GLYCOLAX) packet Take 17 g by mouth daily. [name] taking differently: Take 17 g by mouth as needed.  04/24/18  Yes [name], MD  tamsulosin (FLOMAX) 0.4 MG CAPS capsule Take 1 capsule (0.4 mg total) by mouth daily. 12/29/17  Yes Ray Church III, MD  tiZANidine (ZANAFLEX) 4 MG  tablet TAKE 2 TABLETS BY MOUTH THREE TIMES DAILY PLEASE  CALL  [phone]  TO  SCHEDULE  AN  APPT [name] taking differently: Take 4 mg by mouth every 8 (eight) hours as needed for muscle spasms.  04/03/18  Yes York Spaniel, MD  traMADol (ULTRAM) 50 MG tablet TAKE 1 TABLET BY MOUTH EVERY 6 HOURS AS NEEDED [name] taking differently: Take 50 mg by mouth every 6 (six) hours as needed for moderate pain.  02/18/18  Yes York Spaniel, MD  trazodone (DESYREL) 300 MG tablet TAKE 1 TABLET BY MOUTH ONCE DAILY AT BEDTIME 11/24/17  Yes York Spaniel, MD  Vitamin D, Ergocalciferol, (DRISDOL) 50000 UNITS CAPS Take 50,000 Units by mouth every 7 (seven) days. Friday only   Yes [provider]  ARIPiprazole (ABILIFY) 5 MG tablet Take 1 tablet (5 mg total) by mouth daily. 05/19/18  York Spaniel, MD  topiramate (TOPAMAX) 25 MG tablet Take one tablet at night for one week, then take 2 tablets at night 05/19/18   York Spaniel, MD    ROS:  Out of a complete 14 system review of symptoms, Samuel [name] complains only of Samuel following symptoms, and all other reviewed systems are negative.  Chills, fever, excessive sweating Eye itching, eye redness, double vision, loss of vision, eye pain, blurred vision Wheezing, shortness of breath Pain, muscle cramps, neck pain Memory loss, dizziness, headache, numbness, speech difficulty, tremors Agitation, decreased concentration, depression  Blood pressure 99/70, pulse (!) 108, height 6' 1.5" (1.867 m).  Physical Exam  General: Samuel [name] is alert and cooperative at Samuel time of Samuel examination.  Skin: No significant peripheral edema is noted.  Atrophy of Samuel legs is seen bilaterally.   Neurologic Exam  Mental status: Samuel [name] is alert and oriented x 3 at Samuel time of Samuel examination. Samuel [name] has apparent normal recent and remote memory, with an apparently normal attention span and concentration ability.   Cranial nerves: Facial symmetry  is present. Speech is normal, no aphasia or dysarthria is noted. Extraocular movements are full with exception of bilateral INO. Visual fields are full.  Motor: Samuel [name] has almost no function of all 4 extremities.  His right arm is in flexion, legs are in extension.  Sensory examination: Soft touch sensation is symmetric on Samuel face, arms, and legs.  Coordination: Samuel [name] is unable to perform finger-nose-finger or heel shin on either side.  Gait and station: Samuel [name] is wheelchair-bound, he cannot ambulate.  Reflexes: Deep tendon reflexes are symmetric.   Assessment/Plan:  1.  Multiple sclerosis  2.  Spastic quadriparesis  3.  Depression  4.  Neurogenic bladder  5.  Headache  Samuel [name] will be kept on Wellbutrin 300 mg daily for now, we may consider increasing Samuel dose to 450 mg in Samuel future.  He will be taken off of perphenazine and switch to Abilify 5 mg at night, they will call for any dose adjustments.  Given Samuel headaches, he will be started on low-dose Topamax taking 25 mg at night for 1 week and then go to 50 mg at night.  Samuel [name] wishes to get another urologist, I will try to get this set up.  We discussed Samuel possibility of a palliative care consultation through hospice, I will try to get this set up.  He will follow-up in 6 months.  Blood work will be done, Samuel [name] will remain on Ocrevus.  Marlan Palau MD 05/19/2018 9:20 AM  [facility] [address] [place], [place] [address]  Phone [phone] Fax [phone]

## 2018-05-20 ENCOUNTER — Telehealth: Payer: Self-pay | Admitting: *Deleted

## 2018-05-20 LAB — CBC WITH DIFFERENTIAL/PLATELET
Basophils Absolute: 0 10*3/uL (ref 0.0–0.2)
Basos: 0 %
EOS (ABSOLUTE): 0.2 10*3/uL (ref 0.0–0.4)
Eos: 2 %
Hematocrit: 47.2 % (ref 37.5–51.0)
Hemoglobin: 16.3 g/dL (ref 13.0–17.7)
Immature Grans (Abs): 0 10*3/uL (ref 0.0–0.1)
Immature Granulocytes: 0 %
Lymphocytes Absolute: 1.8 10*3/uL (ref 0.7–3.1)
Lymphs: 18 %
MCH: 30 pg (ref 26.6–33.0)
MCHC: 34.5 g/dL (ref 31.5–35.7)
MCV: 87 fL (ref 79–97)
Monocytes Absolute: 1 10*3/uL — ABNORMAL HIGH (ref 0.1–0.9)
Monocytes: 10 %
Neutrophils Absolute: 7.2 10*3/uL — ABNORMAL HIGH (ref 1.4–7.0)
Neutrophils: 70 %
Platelets: 231 10*3/uL (ref 150–450)
RBC: 5.44 x10E6/uL (ref 4.14–5.80)
RDW: 13.4 % (ref 12.3–15.4)
WBC: 10.3 10*3/uL (ref 3.4–10.8)

## 2018-05-20 LAB — COMPREHENSIVE METABOLIC PANEL
ALT: 38 IU/L (ref 0–44)
AST: 21 IU/L (ref 0–40)
Albumin/Globulin Ratio: 2.1 (ref 1.2–2.2)
Albumin: 4.8 g/dL (ref 3.5–5.5)
Alkaline Phosphatase: 143 IU/L — ABNORMAL HIGH (ref 39–117)
BUN/Creatinine Ratio: 20 (ref 9–20)
BUN: 13 mg/dL (ref 6–24)
Bilirubin Total: 0.3 mg/dL (ref 0.0–1.2)
CO2: 23 mmol/L (ref 20–29)
Calcium: 10 mg/dL (ref 8.7–10.2)
Chloride: 99 mmol/L (ref 96–106)
Creatinine, Ser: 0.65 mg/dL — ABNORMAL LOW (ref 0.76–1.27)
GFR calc Af Amer: 125 mL/min/{1.73_m2} (ref >59)
GFR calc non Af Amer: 108 mL/min/{1.73_m2} (ref >59)
Globulin, Total: 2.3 g/dL (ref 1.5–4.5)
Glucose: 105 mg/dL — ABNORMAL HIGH (ref 65–99)
Potassium: 3.7 mmol/L (ref 3.5–5.2)
Sodium: 140 mmol/L (ref 134–144)
Total Protein: 7.1 g/dL (ref 6.0–8.5)

## 2018-05-20 NOTE — Telephone Encounter (Signed)
Called and spoke with sister, [name] [facility]) about lab results per Dr. Anne Hahn note. She will let Isidor know.

## 2018-05-20 NOTE — Telephone Encounter (Signed)
-----   Message from York Spaniel, MD sent at 05/20/2018  7:20 AM EDT -----  The blood work results are unremarkable, with exception of a mild chronic stable elevation of alkaline phosphatase. No clinical concern. Please call the patient. ----- Message ----- From: Nell Range Lab Results In Sent: 05/20/2018   5:39 AM EDT To: York Spaniel, MD

## 2018-05-21 ENCOUNTER — Telehealth: Payer: Self-pay

## 2018-05-21 NOTE — Telephone Encounter (Signed)
VM left to offer to schedule a visit with Palliative Care.  

## 2018-05-25 ENCOUNTER — Telehealth: Payer: Self-pay

## 2018-05-25 NOTE — Telephone Encounter (Signed)
VM left for patient to offer to schedule visit with Palliative Care 

## 2018-05-27 ENCOUNTER — Other Ambulatory Visit: Payer: Medicare Other | Admitting: Nurse Practitioner

## 2018-05-27 ENCOUNTER — Other Ambulatory Visit: Payer: Self-pay | Admitting: Neurology

## 2018-05-27 DIAGNOSIS — M629 Disorder of muscle, unspecified: Secondary | ICD-10-CM

## 2018-05-27 DIAGNOSIS — G839 Paralytic syndrome, unspecified: Secondary | ICD-10-CM

## 2018-05-27 DIAGNOSIS — F339 Major depressive disorder, recurrent, unspecified: Secondary | ICD-10-CM

## 2018-05-27 DIAGNOSIS — N319 Neuromuscular dysfunction of bladder, unspecified: Secondary | ICD-10-CM

## 2018-05-27 DIAGNOSIS — G8929 Other chronic pain: Secondary | ICD-10-CM

## 2018-05-27 DIAGNOSIS — K592 Neurogenic bowel, not elsewhere classified: Secondary | ICD-10-CM

## 2018-05-27 DIAGNOSIS — K5900 Constipation, unspecified: Secondary | ICD-10-CM

## 2018-05-27 DIAGNOSIS — Z515 Encounter for palliative care: Secondary | ICD-10-CM

## 2018-05-27 NOTE — Progress Notes (Signed)
PALLIATIVE CARE CONSULT VISIT   PATIENT NAME: Samuel [name] DOB: 1961-05-28 MRN: [id number]  PRIMARY CARE PROVIDER:   Barbie Banner, MD  REFERRING PROVIDER:  Barbie Banner, MD [address] [place], [place] [address]  RESPONSIBLE PARTY: Hemanth Mascioli (mother) [phone]  HISTORY OF PRESENT ILLNESS:  Samuel [name] is a 57 y.o. year old male with multiple medical problems including multiple sclerosis with spastic quadriparesis, chronic pain,neurogenic bladder, chronic foley, depression, hypothyroidism. Palliative Care was asked to help with symptom management and to address goals of care.  [id number]  constipation  , rash on bottom getting better with cream, short of breath with repositiing  ASSESSMENT/PLAN/RECOMMENDATIONS -Multiple Sclerosis with spastic quadripareis -followed by [facility]  -Receiving Ocrevus -baclofen 20mg  TID -continue current regimen by Neurology  -Neurogenic bladder/urinary retention/chronic suprapubic foley -bladder calculi -patient with intermittent bladder spasms usually relived by changing foley -cranberry tablet 1 BID -continue current regimen  -Depression -chronic pain -Neurogenic bowel -chronic constipation -followed by psychiatry -recently started on abilify -wellbutrin 150mg  BID -valium 5mg  TID -advised patient to take miralax 3-5 days a week to prevent constipation; hold for diarrhea.  -hypothyroidism -synthroid daily   -ACP -full code    I spent 60 minutes providing this consultation,  from  10:00 to 11:00. More than 50% of the time in this consultation was spent coordinating communication.    CODE STATUS: full code  PPS: 30% HOSPICE ELIGIBILITY/DIAGNOSIS: TBD  PAST MEDICAL HISTORY:  Past Medical History:  Diagnosis Date  . Anxiety   . Bladder calculi   . Chronic back pain   . Chronic fatigue   . Depression   . Foley catheter in place   . Gait disorder   . GERD (gastroesophageal reflux disease)   .  History of kidney stones   . Hyperlipidemia   . Hypertension   . Hypothyroidism   . Incontinence of urine   . Multiple sclerosis (HCC) NEUROLOGIST-- [name]   dx 1993, JC virus negative 2/14---  CURRENTLY RECEIVING TYSABRI IV TREATMENT  . Neurogenic bladder   . Neurogenic bowel    intermittant constipation and diarrhea  . Pneumonia    dx  03-03-2014--  admitted for iv and oral antibiotics  . 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 Urinary retention   . Vitamin D deficiency   . Yeast infection    genital area    SOCIAL HX:  Social History   Tobacco Use  . Smoking status: Former Smoker    Packs/day: 1.50    Years: 8.00    Pack years: 12.00    Types: Cigarettes    Last attempt to quit: 06/17/1985    Years since quitting: 32.9  . Smokeless tobacco: Never Used  Substance Use Topics  . Alcohol use: No    Alcohol/week: 0.0 standard drinks    ALLERGIES:  Allergies  Allergen Reactions  . Penicillins Other (See Comments)    Unknown childhoood reaction (takes amoxicillian with reaction) Has tolerated ceftriaxone, cefepime, and Primaxin during inpatient admissions  . Primaxin [Imipenem] Other (See Comments)    Seizure      PERTINENT MEDICATIONS:  Outpatient Encounter Medications as of 05/27/2018  Medication Sig  . acetic acid 0.25 % irrigation Irrigate with as directed daily. Instill 63ml and clamp tube for 30 minutes then drain.  . ARIPiprazole (ABILIFY) 5 MG tablet Take 1 tablet (5 mg total) by mouth daily.  . AZO-CRANBERRY PO Take 1 tablet by mouth 2 (two) times daily.  Marland Kitchen  baclofen (LIORESAL) 20 MG tablet TAKE 2 TABLETS BY MOUTH THREE TIMES DAILY  . buPROPion (WELLBUTRIN XL) 150 MG 24 hr tablet 1 tablet daily for a week, then take 2 daily  . diazepam (VALIUM) 5 MG tablet Take 1 tablet (5 mg total) by mouth 3 (three) times daily.  Marland Kitchen docusate sodium (COLACE) 250 MG capsule Take 250 mg by mouth daily as needed for constipation.   . Hydrocortisone (GERHARDT'S BUTT CREAM)  CREA Apply 1 application topically 3 (three) times daily.  Marland Kitchen levothyroxine (SYNTHROID, LEVOTHROID) 125 MCG tablet Take 125 mcg by mouth daily.  Marland Kitchen lisinopril-hydrochlorothiazide (PRINZIDE,ZESTORETIC) 10-12.5 MG tablet Take 0.5 tablets by mouth daily.  Marland Kitchen omeprazole (PRILOSEC) 20 MG capsule Take 20 mg by mouth every morning.   Marland Kitchen oxybutynin (DITROPAN XL) 15 MG 24 hr tablet Take 15 mg by mouth every morning.   . polyethylene glycol (MIRALAX / GLYCOLAX) packet Take 17 g by mouth daily. (Patient taking differently: Take 17 g by mouth as needed. )  . tamsulosin (FLOMAX) 0.4 MG CAPS capsule Take 1 capsule (0.4 mg total) by mouth daily.  Marland Kitchen tiZANidine (ZANAFLEX) 4 MG tablet TAKE 2 TABLETS BY MOUTH THREE TIMES DAILY PLEASE  CALL  [phone]  TO  SCHEDULE  AN  APPT (Patient taking differently: Take 4 mg by mouth every 8 (eight) hours as needed for muscle spasms. )  . topiramate (TOPAMAX) 25 MG tablet Take one tablet at night for one week, then take 2 tablets at night  . traMADol (ULTRAM) 50 MG tablet TAKE 1 TABLET BY MOUTH EVERY 6 HOURS AS NEEDED (Patient taking differently: Take 50 mg by mouth every 6 (six) hours as needed for moderate pain. )  . trazodone (DESYREL) 300 MG tablet TAKE 1 TABLET BY MOUTH ONCE DAILY AT BEDTIME  . Vitamin D, Ergocalciferol, (DRISDOL) 50000 UNITS CAPS Take 50,000 Units by mouth every 7 (seven) days. Friday only   Facility-Administered Encounter Medications as of 05/27/2018  Medication  . botulinum toxin Type A (BOTOX) injection 300 Units    PHYSICAL EXAM:   General: NAD, frail appearing, thin Cardiovascular: regular rate and rhythm Pulmonary: clear ant fields Abdomen: soft, nontender, + bowel sounds GU: no suprapubic tenderness Extremities: no edema, bilateral foot drop; flexion contractures of upper ext Skin: no rashes Neurological: Weakness but otherwise nonfocal  Stephanie G [place], NP

## 2018-06-09 ENCOUNTER — Telehealth: Payer: Self-pay | Admitting: Neurology

## 2018-06-09 NOTE — Telephone Encounter (Signed)
Spoke to patient's sister, [name], on [place].  They wanted to confirmed he received his Ocrevus correctly today.  It was confirmed with them that he received the correct medication and dosage.  They appreciated the call.

## 2018-06-09 NOTE — Telephone Encounter (Signed)
Patient's sister [name] calling regarding an infusion he had today and would like to discuss.

## 2018-06-23 ENCOUNTER — Telehealth: Payer: Self-pay | Admitting: Neurology

## 2018-06-23 NOTE — Telephone Encounter (Signed)
I talk with the sister.  The patient has had problems with constipation of the last 2 weeks.  He will get MiraLAX twice a week, taking this daily causes diarrhea.  He took magnesium citrate last evening, he is now having results.  They may want to be more aggressive if he goes for more than 5 days without a bowel movement increase the use of the MiraLAX in the future.

## 2018-06-23 NOTE — Telephone Encounter (Signed)
Pts sister [name] requesting a call stating that the pt hasn't had a bowel movement in about 2 weeks. Requesting a call to discuss a medication that will help (previously tried Miramax and ect medications) please advise

## 2018-06-23 NOTE — Telephone Encounter (Signed)
Spoke with Samuel [name]. Samuel [name] has not had a bowel movement in 2 wks. They have tried Glycerin suppositories, Miralax and Mag Citrate. Have not tried enemas or manual disimpaction/fim

## 2018-06-25 ENCOUNTER — Other Ambulatory Visit: Payer: Medicare Other | Admitting: Nurse Practitioner

## 2018-06-25 DIAGNOSIS — K592 Neurogenic bowel, not elsewhere classified: Secondary | ICD-10-CM

## 2018-06-25 DIAGNOSIS — F339 Major depressive disorder, recurrent, unspecified: Secondary | ICD-10-CM

## 2018-06-25 DIAGNOSIS — Z515 Encounter for palliative care: Secondary | ICD-10-CM

## 2018-06-25 DIAGNOSIS — K5900 Constipation, unspecified: Secondary | ICD-10-CM

## 2018-06-25 DIAGNOSIS — N319 Neuromuscular dysfunction of bladder, unspecified: Secondary | ICD-10-CM

## 2018-06-25 DIAGNOSIS — G839 Paralytic syndrome, unspecified: Secondary | ICD-10-CM

## 2018-06-25 DIAGNOSIS — M629 Disorder of muscle, unspecified: Secondary | ICD-10-CM

## 2018-06-29 ENCOUNTER — Encounter: Payer: Self-pay | Admitting: Nurse Practitioner

## 2018-06-29 NOTE — Progress Notes (Signed)
PALLIATIVE CARE CONSULT VISIT   PATIENT NAME: Samuel [name] DOB: 01-Mar-1961 MRN: [id number]  PRIMARY CARE PROVIDER:   Barbie Banner, MD  REFERRING PROVIDER:  Barbie Banner, MD [address], [place] [address]  RESPONSIBLE PARTY: Quy Lotts (mother) [phone]  HISTORY OF PRESENT ILLNESS:  Samuel [name] is a 57 y.o. year old male with multiple medical problems including multiple sclerosis with spastic quadriparesis, chronic pain,neurogenic bladder, chronic foley, depression, hypothyroidism. Palliative Care was asked to help with symptom management and to address goals of care.  [id number]  Constipation, feels mood has improved  ASSESSMENT/PLAN/RECOMMENDATIONS -Multiple Sclerosis with spastic quadripareis -followed by [facility]  -Receiving Ocrevus -baclofen 20mg  TID -continue current regimen by Neurology  -Neurogenic bladder/urinary retention/chronic suprapubic foley -bladder calculi -patient with intermittent bladder spasms usually relived by changing foley -cranberry tablet 1 BID -continue current regimen  -Depression (improved) -chronic pain -Neurogenic bowel -chronic constipation -followed by psychiatry -recently started on abilify -wellbutrin 150mg  BID -valium 5mg  TID   -hypothyroidism -synthroid daily   -ACP -full code    I spent 30 minutes providing this consultation, from 10:00 to 10:30. More than 50% of the time in this consultation was spent coordinating communication.    CODE STATUS: full code  PPS: 30% HOSPICE ELIGIBILITY/DIAGNOSIS: TBD  PAST MEDICAL HISTORY:  Past Medical History:  Diagnosis Date  . Anxiety   . Bladder calculi   . Chronic back pain   . Chronic fatigue   . Depression   . Foley catheter in place   . Gait disorder   . GERD (gastroesophageal reflux disease)   . History of kidney stones   . Hyperlipidemia   . Hypertension   . Hypothyroidism   . Incontinence of urine   . Multiple sclerosis  (HCC) NEUROLOGIST-- [name]   dx 1993, JC virus negative 2/14---  CURRENTLY RECEIVING TYSABRI IV TREATMENT  . Neurogenic bladder   . Neurogenic bowel    intermittant constipation and diarrhea  . Pneumonia    dx  03-03-2014--  admitted for iv and oral antibiotics  . 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 Urinary retention   . Vitamin D deficiency   . Yeast infection    genital area    SOCIAL HX:  Social History   Tobacco Use  . Smoking status: Former Smoker    Packs/day: 1.50    Years: 8.00    Pack years: 12.00    Types: Cigarettes    Last attempt to quit: 06/17/1985    Years since quitting: 33.0  . Smokeless tobacco: Never Used  Substance Use Topics  . Alcohol use: No    Alcohol/week: 0.0 standard drinks    ALLERGIES:  Allergies  Allergen Reactions  . Penicillins Other (See Comments)    Unknown childhoood reaction (takes amoxicillian with reaction) Has tolerated ceftriaxone, cefepime, and Primaxin during inpatient admissions  . Primaxin [Imipenem] Other (See Comments)    Seizure      PERTINENT MEDICATIONS:  Outpatient Encounter Medications as of 06/25/2018  Medication Sig  . acetic acid 0.25 % irrigation Irrigate with as directed daily. Instill 30ml and clamp tube for 30 minutes then drain.  . ARIPiprazole (ABILIFY) 5 MG tablet Take 1 tablet (5 mg total) by mouth daily.  . AZO-CRANBERRY PO Take 1 tablet by mouth 2 (two) times daily.  . baclofen (LIORESAL) 20 MG tablet TAKE 2 TABLETS BY MOUTH THREE TIMES DAILY  . buPROPion (WELLBUTRIN XL) 150 MG 24 hr tablet 1  tablet daily for a week, then take 2 daily  . diazepam (VALIUM) 5 MG tablet Take 1 tablet (5 mg total) by mouth 3 (three) times daily.  Marland Kitchen docusate sodium (COLACE) 250 MG capsule Take 250 mg by mouth daily as needed for constipation.   . Hydrocortisone (GERHARDT'S BUTT CREAM) CREA Apply 1 application topically 3 (three) times daily.  Marland Kitchen levothyroxine (SYNTHROID, LEVOTHROID) 125 MCG tablet Take 125 mcg by mouth  daily.  Marland Kitchen lisinopril-hydrochlorothiazide (PRINZIDE,ZESTORETIC) 10-12.5 MG tablet Take 0.5 tablets by mouth daily.  Marland Kitchen omeprazole (PRILOSEC) 20 MG capsule Take 20 mg by mouth every morning.   Marland Kitchen oxybutynin (DITROPAN XL) 15 MG 24 hr tablet Take 15 mg by mouth every morning.   . polyethylene glycol (MIRALAX / GLYCOLAX) packet Take 17 g by mouth daily. (Patient taking differently: Take 17 g by mouth as needed. )  . tamsulosin (FLOMAX) 0.4 MG CAPS capsule Take 1 capsule (0.4 mg total) by mouth daily.  Marland Kitchen tiZANidine (ZANAFLEX) 4 MG tablet TAKE 2 TABLETS BY MOUTH THREE TIMES DAILY PLEASE  CALL  [phone]  TO  SCHEDULE  AN  APPT (Patient taking differently: Take 4 mg by mouth every 8 (eight) hours as needed for muscle spasms. )  . topiramate (TOPAMAX) 25 MG tablet Take one tablet at night for one week, then take 2 tablets at night  . traMADol (ULTRAM) 50 MG tablet TAKE 1 TABLET BY MOUTH EVERY 6 HOURS AS NEEDED (Patient taking differently: Take 50 mg by mouth every 6 (six) hours as needed for moderate pain. )  . trazodone (DESYREL) 300 MG tablet TAKE 1 TABLET BY MOUTH ONCE DAILY AT BEDTIME  . Vitamin D, Ergocalciferol, (DRISDOL) 50000 UNITS CAPS Take 50,000 Units by mouth every 7 (seven) days. Friday only   Facility-Administered Encounter Medications as of 06/25/2018  Medication  . botulinum toxin Type A (BOTOX) injection 300 Units    PHYSICAL EXAM:   General: NAD,  thin Cardiovascular: regular rate and rhythm Pulmonary: clear ant fields Abdomen: soft, nontender, + bowel sounds GU: no suprapubic tenderness Extremities: no edema, bilateral foot drop; flexion contractures of upper ext Skin: no rashes of exposed skin Neurological: spastic quadraparesis  Markayla Reichart G [place], NP

## 2018-07-01 ENCOUNTER — Other Ambulatory Visit: Payer: Self-pay | Admitting: Neurology

## 2018-07-13 ENCOUNTER — Other Ambulatory Visit: Payer: Self-pay | Admitting: Neurology

## 2018-07-22 ENCOUNTER — Other Ambulatory Visit: Payer: Self-pay | Admitting: Neurology

## 2018-09-01 ENCOUNTER — Other Ambulatory Visit: Payer: Self-pay | Admitting: Neurology

## 2018-09-03 ENCOUNTER — Other Ambulatory Visit: Payer: Medicare Other | Admitting: Nurse Practitioner

## 2018-09-03 DIAGNOSIS — Z515 Encounter for palliative care: Secondary | ICD-10-CM

## 2018-09-03 DIAGNOSIS — F339 Major depressive disorder, recurrent, unspecified: Secondary | ICD-10-CM

## 2018-09-03 DIAGNOSIS — G8929 Other chronic pain: Secondary | ICD-10-CM

## 2018-09-03 DIAGNOSIS — K5900 Constipation, unspecified: Secondary | ICD-10-CM

## 2018-09-03 DIAGNOSIS — G839 Paralytic syndrome, unspecified: Secondary | ICD-10-CM

## 2018-09-03 DIAGNOSIS — M629 Disorder of muscle, unspecified: Secondary | ICD-10-CM

## 2018-09-03 DIAGNOSIS — B356 Tinea cruris: Secondary | ICD-10-CM

## 2018-09-04 ENCOUNTER — Encounter: Payer: Self-pay | Admitting: Nurse Practitioner

## 2018-09-04 NOTE — Progress Notes (Signed)
PALLIATIVE CARE CONSULT VISIT   PATIENT NAME: Samuel [name] DOB: Feb 12, 1961 MRN: [id number]  PRIMARY CARE PROVIDER:   Barbie Banner, MD  REFERRING PROVIDER:  Barbie Banner, MD [address] [place] [address]  RESPONSIBLE PARTY: Haddon Lanzo (mother) [phone]  HISTORY OF PRESENT ILLNESS:  Samuel [name] is a 58 y.o. year old male with multiple medical problems including multiple sclerosis with spastic quadriparesis, chronic pain,neurogenic bladder, chronic foley, depression, hypothyroidism. Palliative Care was asked to help with symptom management and to address goals of care.   CC/Interim history: Family requested visit to assess rash of buttocks that has not improved with recommendations they were given at discharge from most recent hospiltalization.family have been apply non-medicated barrier creams to area with no improvement. Patient reports burning pain of rash.  ASSESSMENT/PLAN/RECOMMENDATIONS Tinea Cruris of buttocks,perineum,scrotum and inner thighs -was given prescription for diflucan for possible yeast 150mg  daily for 3 days -lotrisone 1% cream apply to affected area twice daily for two weeks  -Multiple Sclerosis with spastic quadripareis -followed by [facility]  -Receiving Ocrevus -baclofen 20mg  TID  -Neurogenic bladder/urinary retention/chronic suprapubic foley -bladder calculi -patient with intermittent bladder spasms usually relived by changing foley -cranberry tablet 1 BID  -Depression (improved) -chronic pain -Neurogenic bowel -chronic constipation -followed by psychiatry -recently started on abilify -wellbutrin 150mg  BID -valium 5mg  TID  -hypothyroidism -synthroid daily  -ACP -full code   I spent 30 minutes providing this consultation, from 12:00 to 12:30. More than 50% of the time in this consultation was spent coordinating communication.    CODE STATUS: full code  PPS: 30% HOSPICE ELIGIBILITY/DIAGNOSIS:  TBD  PAST MEDICAL HISTORY:  Past Medical History:  Diagnosis Date  . Anxiety   . Bladder calculi   . Chronic back pain   . Chronic fatigue   . Depression   . Foley catheter in place   . Gait disorder   . GERD (gastroesophageal reflux disease)   . History of kidney stones   . Hyperlipidemia   . Hypertension   . Hypothyroidism   . Incontinence of urine   . Multiple sclerosis (HCC) NEUROLOGIST-- [name]   dx 1993, JC virus negative 2/14---  CURRENTLY RECEIVING TYSABRI IV TREATMENT  . Neurogenic bladder   . Neurogenic bowel    intermittant constipation and diarrhea  . Pneumonia    dx  03-03-2014--  admitted for iv and oral antibiotics  . 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 Urinary retention   . Vitamin D deficiency   . Yeast infection    genital area    SOCIAL HX:  Social History   Tobacco Use  . Smoking status: Former Smoker    Packs/day: 1.50    Years: 8.00    Pack years: 12.00    Types: Cigarettes    Last attempt to quit: 06/17/1985    Years since quitting: 33.2  . Smokeless tobacco: Never Used  Substance Use Topics  . Alcohol use: No    Alcohol/week: 0.0 standard drinks    ALLERGIES:  Allergies  Allergen Reactions  . Penicillins Other (See Comments)    Unknown childhoood reaction (takes amoxicillian with reaction) Has tolerated ceftriaxone, cefepime, and Primaxin during inpatient admissions  . Primaxin [Imipenem] Other (See Comments)    Seizure      PERTINENT MEDICATIONS:  Outpatient Encounter Medications as of 09/03/2018  Medication Sig  . acetic acid 0.25 % irrigation Irrigate with as directed daily. Instill 79ml and clamp tube for 30  minutes then drain.  . ARIPiprazole (ABILIFY) 5 MG tablet Take 1 tablet (5 mg total) by mouth daily.  . AZO-CRANBERRY PO Take 1 tablet by mouth 2 (two) times daily.  . baclofen (LIORESAL) 20 MG tablet TAKE 2 TABLETS BY MOUTH THREE TIMES DAILY  . buPROPion (WELLBUTRIN XL) 150 MG 24 hr tablet 2 TABLETS DAILY  .  diazepam (VALIUM) 5 MG tablet TAKE 1 TABLET BY MOUTH THREE TIMES DAILY  . docusate sodium (COLACE) 250 MG capsule Take 250 mg by mouth daily as needed for constipation.   . Hydrocortisone (GERHARDT'S BUTT CREAM) CREA Apply 1 application topically 3 (three) times daily.  Marland Kitchen levothyroxine (SYNTHROID, LEVOTHROID) 125 MCG tablet Take 125 mcg by mouth daily.  Marland Kitchen lisinopril-hydrochlorothiazide (PRINZIDE,ZESTORETIC) 10-12.5 MG tablet Take 0.5 tablets by mouth daily.  Marland Kitchen omeprazole (PRILOSEC) 20 MG capsule Take 20 mg by mouth every morning.   Marland Kitchen oxybutynin (DITROPAN XL) 15 MG 24 hr tablet Take 15 mg by mouth every morning.   . polyethylene glycol (MIRALAX / GLYCOLAX) packet Take 17 g by mouth daily. (Patient taking differently: Take 17 g by mouth as needed. )  . tamsulosin (FLOMAX) 0.4 MG CAPS capsule Take 1 capsule (0.4 mg total) by mouth daily.  Marland Kitchen tiZANidine (ZANAFLEX) 4 MG tablet TAKE 2 TABLETS BY MOUTH THREE TIMES DAILY.  PLEASE CALL [phone] TO SCHEDULE AN APPOINTMENT  . topiramate (TOPAMAX) 25 MG tablet Take one tablet at night for one week, then take 2 tablets at night  . traMADol (ULTRAM) 50 MG tablet TAKE 1 TABLET BY MOUTH EVERY 6 HOURS AS NEEDED (Patient taking differently: Take 50 mg by mouth every 6 (six) hours as needed for moderate pain. )  . trazodone (DESYREL) 300 MG tablet TAKE 1 TABLET BY MOUTH ONCE DAILY AT BEDTIME  . Vitamin D, Ergocalciferol, (DRISDOL) 50000 UNITS CAPS Take 50,000 Units by mouth every 7 (seven) days. Friday only   Facility-Administered Encounter Medications as of 09/03/2018  Medication  . botulinum toxin Type A (BOTOX) injection 300 Units    PHYSICAL EXAM:   General: NAD,  thin Cardiovascular: regular rate and rhythm Pulmonary: clear ant fields Abdomen: soft, nontender, + bowel sounds GU: no suprapubic tenderness Extremities: no edema, bilateral foot drop; flexion contractures of upper ext Skin: macular red-fiery  Rash of inner and outer buttocks, perineum, scrotum  and inner thighs Neurological: spastic quadraparesis  Giulliana Mcroberts G [place], NP

## 2018-09-16 ENCOUNTER — Other Ambulatory Visit: Payer: Self-pay

## 2018-09-16 ENCOUNTER — Encounter (HOSPITAL_COMMUNITY): Payer: Self-pay

## 2018-09-16 ENCOUNTER — Emergency Department (HOSPITAL_COMMUNITY): Payer: Medicare Other

## 2018-09-16 ENCOUNTER — Inpatient Hospital Stay (HOSPITAL_COMMUNITY)
Admission: EM | Admit: 2018-09-16 | Discharge: 2018-09-18 | DRG: 689 | Disposition: A | Discharge status: Home Health | Payer: Medicare Other | Attending: Internal Medicine | Admitting: Internal Medicine

## 2018-09-16 DIAGNOSIS — R0602 Shortness of breath: Secondary | ICD-10-CM

## 2018-09-16 DIAGNOSIS — Z880 Allergy status to penicillin: Secondary | ICD-10-CM

## 2018-09-16 DIAGNOSIS — R109 Unspecified abdominal pain: Secondary | ICD-10-CM

## 2018-09-16 DIAGNOSIS — E876 Hypokalemia: Secondary | ICD-10-CM | POA: Diagnosis present

## 2018-09-16 DIAGNOSIS — G9341 Metabolic encephalopathy: Secondary | ICD-10-CM | POA: Diagnosis present

## 2018-09-16 DIAGNOSIS — F329 Major depressive disorder, single episode, unspecified: Secondary | ICD-10-CM | POA: Diagnosis present

## 2018-09-16 DIAGNOSIS — R441 Visual hallucinations: Secondary | ICD-10-CM | POA: Diagnosis present

## 2018-09-16 DIAGNOSIS — N390 Urinary tract infection, site not specified: Secondary | ICD-10-CM | POA: Diagnosis present

## 2018-09-16 DIAGNOSIS — N319 Neuromuscular dysfunction of bladder, unspecified: Secondary | ICD-10-CM | POA: Diagnosis present

## 2018-09-16 DIAGNOSIS — E039 Hypothyroidism, unspecified: Secondary | ICD-10-CM | POA: Diagnosis present

## 2018-09-16 DIAGNOSIS — Z87440 Personal history of urinary (tract) infections: Secondary | ICD-10-CM

## 2018-09-16 DIAGNOSIS — K219 Gastro-esophageal reflux disease without esophagitis: Secondary | ICD-10-CM | POA: Diagnosis present

## 2018-09-16 DIAGNOSIS — Z87891 Personal history of nicotine dependence: Secondary | ICD-10-CM | POA: Diagnosis not present

## 2018-09-16 DIAGNOSIS — G35 Multiple sclerosis: Secondary | ICD-10-CM | POA: Diagnosis present

## 2018-09-16 DIAGNOSIS — Z79899 Other long term (current) drug therapy: Secondary | ICD-10-CM

## 2018-09-16 DIAGNOSIS — F419 Anxiety disorder, unspecified: Secondary | ICD-10-CM | POA: Diagnosis present

## 2018-09-16 DIAGNOSIS — N400 Enlarged prostate without lower urinary tract symptoms: Secondary | ICD-10-CM | POA: Diagnosis present

## 2018-09-16 DIAGNOSIS — I10 Essential (primary) hypertension: Secondary | ICD-10-CM | POA: Diagnosis present

## 2018-09-16 DIAGNOSIS — E785 Hyperlipidemia, unspecified: Secondary | ICD-10-CM | POA: Diagnosis present

## 2018-09-16 LAB — CBC WITH DIFFERENTIAL/PLATELET
Abs Immature Granulocytes: 0.09 10*3/uL — ABNORMAL HIGH (ref 0.00–0.07)
Basophils Absolute: 0 10*3/uL (ref 0.0–0.1)
Basophils Relative: 0 %
Eosinophils Absolute: 0 10*3/uL (ref 0.0–0.5)
Eosinophils Relative: 1 %
HCT: 53.5 % — ABNORMAL HIGH (ref 39.0–52.0)
Hemoglobin: 16.9 g/dL (ref 13.0–17.0)
Immature Granulocytes: 1 %
Lymphocytes Relative: 21 %
Lymphs Abs: 1.9 10*3/uL (ref 0.7–4.0)
MCH: 28.5 pg (ref 26.0–34.0)
MCHC: 31.6 g/dL (ref 30.0–36.0)
MCV: 90.2 fL (ref 80.0–100.0)
Monocytes Absolute: 0.7 10*3/uL (ref 0.1–1.0)
Monocytes Relative: 8 %
Neutro Abs: 6.1 10*3/uL (ref 1.7–7.7)
Neutrophils Relative %: 69 %
Platelets: 257 10*3/uL (ref 150–400)
RBC: 5.93 MIL/uL — ABNORMAL HIGH (ref 4.22–5.81)
RDW: 13.4 % (ref 11.5–15.5)
WBC: 8.9 10*3/uL (ref 4.0–10.5)
nRBC: 0 % (ref 0.0–0.2)

## 2018-09-16 LAB — COMPREHENSIVE METABOLIC PANEL
ALT: 41 U/L (ref 0–44)
AST: 25 U/L (ref 15–41)
Albumin: 5.4 g/dL — ABNORMAL HIGH (ref 3.5–5.0)
Alkaline Phosphatase: 147 U/L — ABNORMAL HIGH (ref 38–126)
Anion gap: 13 (ref 5–15)
BUN: 18 mg/dL (ref 6–20)
CO2: 27 mmol/L (ref 22–32)
Calcium: 10.1 mg/dL (ref 8.9–10.3)
Chloride: 98 mmol/L (ref 98–111)
Creatinine, Ser: 0.79 mg/dL (ref 0.61–1.24)
GFR calc Af Amer: >60 mL/min (ref >60)
GFR calc non Af Amer: >60 mL/min (ref >60)
Glucose, Bld: 81 mg/dL (ref 70–99)
Potassium: 3 mmol/L — ABNORMAL LOW (ref 3.5–5.1)
Sodium: 138 mmol/L (ref 135–145)
Total Bilirubin: 0.7 mg/dL (ref 0.3–1.2)
Total Protein: 9 g/dL — ABNORMAL HIGH (ref 6.5–8.1)

## 2018-09-16 LAB — URINALYSIS, ROUTINE W REFLEX MICROSCOPIC
Bilirubin Urine: NEGATIVE
Glucose, UA: NEGATIVE mg/dL
Ketones, ur: NEGATIVE mg/dL
Nitrite: NEGATIVE
Protein, ur: NEGATIVE mg/dL
Specific Gravity, Urine: 1.003 — ABNORMAL LOW (ref 1.005–1.030)
WBC, UA: >50 WBC/hpf — ABNORMAL HIGH (ref 0–5)
pH: 7 (ref 5.0–8.0)

## 2018-09-16 LAB — RAPID URINE DRUG SCREEN, HOSP PERFORMED
Amphetamines: NOT DETECTED
Barbiturates: NOT DETECTED
Benzodiazepines: POSITIVE — AB
Cocaine: NOT DETECTED
Opiates: NOT DETECTED
Tetrahydrocannabinol: NOT DETECTED

## 2018-09-16 LAB — MAGNESIUM: Magnesium: 2.2 mg/dL (ref 1.7–2.4)

## 2018-09-16 LAB — I-STAT CG4 LACTIC ACID, ED: Lactic Acid, Venous: 1.99 mmol/L — ABNORMAL HIGH (ref 0.5–1.9)

## 2018-09-16 LAB — LACTIC ACID, PLASMA: Lactic Acid, Venous: 2.1 mmol/L (ref 0.5–1.9)

## 2018-09-16 MED ORDER — OXYCODONE-ACETAMINOPHEN 5-325 MG PO TABS
1.0000 | ORAL_TABLET | Freq: Once | ORAL | Status: AC
  Administered 2018-09-16: 1 via ORAL
  Filled 2018-09-16: qty 1

## 2018-09-16 MED ORDER — SODIUM CHLORIDE 0.9 % IV SOLN
INTRAVENOUS | Status: DC
  Administered 2018-09-16: 12:00:00 via INTRAVENOUS

## 2018-09-16 MED ORDER — LEVOFLOXACIN IN D5W 500 MG/100ML IV SOLN: 500.0000 mg | INTRAVENOUS | Status: DC

## 2018-09-16 MED ORDER — TIZANIDINE HCL 4 MG PO TABS
4.0000 mg | ORAL_TABLET | Freq: Three times a day (TID) | ORAL | Status: DC
  Administered 2018-09-16 – 2018-09-18 (×6): 4 mg via ORAL
  Filled 2018-09-16 (×6): qty 1

## 2018-09-16 MED ORDER — TOPIRAMATE 25 MG PO TABS
50.0000 mg | ORAL_TABLET | Freq: Every day | ORAL | Status: DC
  Administered 2018-09-16 – 2018-09-17 (×2): 50 mg via ORAL
  Filled 2018-09-16 (×2): qty 2

## 2018-09-16 MED ORDER — ACETAMINOPHEN 650 MG RE SUPP: 650.0000 mg | Freq: Four times a day (QID) | RECTAL | Status: DC | PRN

## 2018-09-16 MED ORDER — ARIPIPRAZOLE 5 MG PO TABS
5.0000 mg | ORAL_TABLET | Freq: Every day | ORAL | Status: DC
  Administered 2018-09-16 – 2018-09-18 (×3): 5 mg via ORAL
  Filled 2018-09-16 (×3): qty 1

## 2018-09-16 MED ORDER — ONDANSETRON HCL 4 MG PO TABS: 4.0000 mg | ORAL_TABLET | Freq: Four times a day (QID) | ORAL | Status: DC | PRN

## 2018-09-16 MED ORDER — LEVOFLOXACIN IN D5W 750 MG/150ML IV SOLN
750.0000 mg | Freq: Once | INTRAVENOUS | Status: DC
  Administered 2018-09-16: 750 mg via INTRAVENOUS
  Filled 2018-09-16: qty 150

## 2018-09-16 MED ORDER — LACTATED RINGERS IV SOLN
INTRAVENOUS | Status: AC
  Administered 2018-09-16: 16:00:00 via INTRAVENOUS

## 2018-09-16 MED ORDER — BACLOFEN 20 MG PO TABS
40.0000 mg | ORAL_TABLET | Freq: Three times a day (TID) | ORAL | Status: DC
  Administered 2018-09-16 – 2018-09-18 (×6): 40 mg via ORAL
  Filled 2018-09-16 (×3): qty 2
  Filled 2018-09-16: qty 4
  Filled 2018-09-16 (×2): qty 2

## 2018-09-16 MED ORDER — ONDANSETRON HCL 4 MG/2ML IJ SOLN: 4.0000 mg | Freq: Four times a day (QID) | INTRAMUSCULAR | Status: DC | PRN

## 2018-09-16 MED ORDER — SODIUM CHLORIDE 0.9 % IV SOLN: 1.0000 g | INTRAVENOUS | Status: DC

## 2018-09-16 MED ORDER — LEVOTHYROXINE SODIUM 125 MCG PO TABS
125.0000 ug | ORAL_TABLET | Freq: Every day | ORAL | Status: DC
  Administered 2018-09-17 – 2018-09-18 (×2): 125 ug via ORAL
  Filled 2018-09-16 (×2): qty 1

## 2018-09-16 MED ORDER — ACETAMINOPHEN 325 MG PO TABS
650.0000 mg | ORAL_TABLET | Freq: Four times a day (QID) | ORAL | Status: DC | PRN
  Administered 2018-09-17: 650 mg via ORAL
  Filled 2018-09-16: qty 2

## 2018-09-16 MED ORDER — POTASSIUM CHLORIDE 10 MEQ/100ML IV SOLN
10.0000 meq | INTRAVENOUS | Status: AC
  Administered 2018-09-16 (×2): 10 meq via INTRAVENOUS
  Filled 2018-09-16 (×2): qty 100

## 2018-09-16 MED ORDER — POLYETHYLENE GLYCOL 3350 17 G PO PACK: 17.0000 g | PACK | Freq: Every day | ORAL | Status: DC | PRN

## 2018-09-16 MED ORDER — POTASSIUM CHLORIDE CRYS ER 20 MEQ PO TBCR
40.0000 meq | EXTENDED_RELEASE_TABLET | Freq: Once | ORAL | Status: AC
  Administered 2018-09-16: 40 meq via ORAL
  Filled 2018-09-16: qty 2

## 2018-09-16 MED ORDER — ENOXAPARIN SODIUM 40 MG/0.4ML ~~LOC~~ SOLN
40.0000 mg | SUBCUTANEOUS | Status: DC
  Administered 2018-09-17 – 2018-09-18 (×2): 40 mg via SUBCUTANEOUS
  Filled 2018-09-16 (×2): qty 0.4

## 2018-09-16 MED ORDER — PANTOPRAZOLE SODIUM 40 MG PO TBEC
40.0000 mg | DELAYED_RELEASE_TABLET | Freq: Every day | ORAL | Status: DC
  Administered 2018-09-17 – 2018-09-18 (×2): 40 mg via ORAL
  Filled 2018-09-16 (×2): qty 1

## 2018-09-16 MED ORDER — BUPROPION HCL ER (XL) 150 MG PO TB24
300.0000 mg | ORAL_TABLET | Freq: Every day | ORAL | Status: DC
  Administered 2018-09-16 – 2018-09-18 (×3): 300 mg via ORAL
  Filled 2018-09-16 (×3): qty 2

## 2018-09-16 MED ORDER — TRAMADOL HCL 50 MG PO TABS
50.0000 mg | ORAL_TABLET | Freq: Four times a day (QID) | ORAL | Status: DC | PRN
  Administered 2018-09-16 – 2018-09-18 (×2): 50 mg via ORAL
  Filled 2018-09-16 (×2): qty 1

## 2018-09-16 MED ORDER — SODIUM CHLORIDE 0.9 % IV BOLUS
500.0000 mL | Freq: Once | INTRAVENOUS | Status: AC
  Administered 2018-09-16: 500 mL via INTRAVENOUS

## 2018-09-16 NOTE — Progress Notes (Signed)
Brief note regarding plan, with full H&P to follow:   Samuel [name] is a 58 y.o. male who is chronically bedbound with medical history significant for multiple sclerosis, recurrent urinary tract infections, urinary retention on the basis of neurogenic bladder, who is admitted to [facility] on 1 15,020 with acute metabolic encephalopathy after presenting from home to the [facility] emergency department for evaluation of confusion.    #) Acute metabolic encephalopathy: Patient presents from home to the emergency department with 1 day of confusion and visual hallucinations, which appears to be metabolic in etiology on the basis of underlying urinary tract infection, with, per family multiple similar presentations in the setting of recurrent UTI's.  Mental status appears to be improving, and now at baseline per family present at bedside following initiation of IV antibiotics in the ED today.  Relative to baseline, no evidence of acute focal neurologic deficits at this time.  No apparent recent changes to outpatient medication regimen, rendering pharmacologic contributions to be less likely.  No other source of infection identified at this time.   Plan: Nursing bedside swallow evaluation prior to initiation of diet.  Work-up and management of complicated urinary tract infection.  While appearing to be less likely, will try to limit any potential pharmacologic influences, by holding home PRN Valium as well as trazodone for now.  In the context of a history of acquired hypothyroidism, will also check reflex TSH.  We will also check VBG, ammonia level, and urinary drug screen.  Repeat CMP as well as CBC in the morning.    [name], DO Hospitalist

## 2018-09-16 NOTE — ED Notes (Signed)
Patient is requesting pain medication for a frontal headache, rates 6/10. Informed Dr. Cordelia Poche and awaiting orders.

## 2018-09-16 NOTE — ED Triage Notes (Signed)
Pt arrives via [facility] from home. Pt has hx of MS. Per EMS: Pt was disoriented this am. Pt has chronic indwelling cath.  EMS vitals: BP:130/90  HR:90  RR:20  Spo2: 94%

## 2018-09-16 NOTE — ED Notes (Signed)
Tramadol given po per orders for headache pain 3/10.  Pt unable to push call button to call for assistance due to contractures.  Staff aware to pay close attention to patient for needs.

## 2018-09-16 NOTE — H&P (Signed)
History and Physical    PLEASE NOTE THAT DRAGON DICTATION SOFTWARE WAS USED IN THE CONSTRUCTION OF THIS NOTE.   Samuel [name] QQV:[id number] DOB: 26-Jun-1961 DOA: 09/16/2018  PCP: Barbie Banner, MD Patient coming from: home  I have personally briefly reviewed patient's old medical records in [facility]  Chief Complaint: Confusion  HPI: Samuel [name] is a 58 y.o. male who is chronically bedbound with medical history significant for multiple sclerosis, recurrent urinary tract infections, urinary retention on the basis of neurogenic bladder, who is admitted to [facility] long hospital on 1 15,020 with acute metabolic encephalopathy after presenting from home to the [facility] emergency department for evaluation of confusion.   The following history is obtained via my discussions with the patient as well as my discussions with his wife and daughter, both of whom are present at bedside, in addition to my discussions with the emergency department physician and via chart review.  The patient reportedly developed confusion over the approximately 12 hours leading up to presentation at [facility] emergency department.  Specifically, the patient's wife and daughter convey that the patient, while located in side of his home, thought that he was out in the woods hunting deer.  At one point this morning, the patient reportedly developed visual hallucinations, thinking that he was seeing a deer inside of his bedroom.  At that time, he reportedly raised his arms as if he was raising an absent firearm, and instructed his wife to "get out of the line of fire".  The patient's wife and daughter both report that the patient, in the context of a history of neurogenic bladder secondary to multiple sclerosis leading to recurrent urinary tract infections, has a history of episodes of acute encephalopathy secondary to acute urinary tract infections presenting with very similar confusion and visual  hallucinations.  They report no recent changes to the patient's home medication regimen. Patient follows with Dr. Anne Hahn of outpatient neurology for his MS.  Patient's wife and daughter report that the patient has a history of obstructing ureterolithiasis requiring cystoscopy with ureteral stent placement.  They note that the patient has been complaining of mild left flank discomfort over the preceding 2 to 3 days, and they verbalized her concern regarding potential recurrence of ureterolithiasis.  It appears that the patient has a history of ESBL UTI's.     ED Course: Vital signs in the emergency department were notable for the following temperature 98.5, heart rate 89-1 01, blood pressure 135/86, respiratory rate 16-21, and oxygen saturation 97 to 98% on room air.  Labs were in the ED were notable for the following: CMP notable for sodium 138, potassium 3.0, creatinine 0.79, glucose 81.  CBC notable for white blood cell count of 8900 with 70% neutrophils; lactic acid 1.99.  Urinalysis showed greater than 50 white blood cells and large leukocyte Estrace.  Blood cultures x2 and urine culture collected prior to initiation of any antibiotics.  2 view chest x-ray, per final radiology report showed no evidence of acute cardiopulmonary process, including no evidence of infiltrate.  While still in the emergency department, the following were administered: Levaquin 750 mg IV x1 and potassium chloride 40 mEq p.o. x1.  Subsequently, the patient was admitted to the MedSurg floor for further evaluation and management of his presenting acute metabolic encephalopathy associated with suspected complicated urinary tract infection.    Review of Systems: As per HPI otherwise 10 point review of systems negative.   Past Medical  History:  Diagnosis Date  . Anxiety   . Bladder calculi   . Chronic back pain   . Chronic fatigue   . Depression   . Foley catheter in place   . Gait disorder   . GERD (gastroesophageal  reflux disease)   . History of kidney stones   . Hyperlipidemia   . Hypertension   . Hypothyroidism   . Incontinence of urine   . Multiple sclerosis (HCC) NEUROLOGIST-- [name]   dx 1993, JC virus negative 2/14---  CURRENTLY RECEIVING TYSABRI IV TREATMENT  . Neurogenic bladder   . Neurogenic bowel    intermittant constipation and diarrhea  . Pneumonia    dx  03-03-2014--  admitted for iv and oral antibiotics  . 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 Urinary retention   . Vitamin D deficiency   . Yeast infection    genital area    Past Surgical History:  Procedure Laterality Date  . APPENDECTOMY  age 59  . CYSTOSCOPY N/A 03/21/2014   Procedure: CYSTOSCOPY TREATMENT OF BLADDER CALCULI;  Surgeon: [name], MD;  Location: [facility];  Service: Urology;  Laterality: N/A;  . CYSTOSCOPY WITH BIOPSY N/A 12/29/2017   Procedure: TRANSURETHRAL RESECTION OF BLADDER TUMOR;  Surgeon: [name], MD;  Location: [facility];  Service: Urology;  Laterality: N/A;  . CYSTOSCOPY WITH RETROGRADE PYELOGRAM, URETEROSCOPY AND STENT PLACEMENT Right 12/11/2017   Procedure: CYSTOSCOPY WITH RETROGRADE PYELOGRAM, URETEROSCOPY AND STENT PLACEMENT RIGHT;  Surgeon: [name], MD;  Location: [facility];  Service: Urology;  Laterality: Right;  . CYSTOSCOPY WITH RETROGRADE PYELOGRAM, URETEROSCOPY AND STENT PLACEMENT Right 12/29/2017   Procedure: CYSTOSCOPY WITH RIGHT  RETROGRADE PYELOGRAM, URETEROSCOPY/LASER LITHOTRIPSY AND STENT PLACEMENT;  Surgeon: [name], MD;  Location: [facility];  Service: Urology;  Laterality: Right;  . INSERTION OF SUPRAPUBIC CATHETER N/A 03/21/2014   Procedure: INSERTION OF SUPRAPUBIC CATHETER;  Surgeon: [name], MD;  Location[facility];  Service: Urology;  Laterality: N/A;  . NASAL SEPTUM SURGERY  1996  . NEGATIVE SLEEP STUDY  1996  . TRANSTHORACIC ECHOCARDIOGRAM  02-07-2004   MILD LVH/  EF 55-65%    Social History:  reports  that he quit smoking about 33 years ago. His smoking use included cigarettes. He has a 12.00 pack-year smoking history. He has never used smokeless tobacco. He reports that he does not drink alcohol or use drugs.   Allergies  Allergen Reactions  . Penicillins Other (See Comments)    > 20 yr ago, doesn't recall nature of reaction (didn't require hospitalization) Tolerates Ancef, Rocephin, and Cefepime per chart review Does not recall having taken amox/amp since initial rxn; none documented in Epic   . Primaxin [Imipenem] Other (See Comments)    Seizure     Family History  Problem Relation Age of Onset  . Stroke Father   . Cirrhosis Brother   . Hypothyroidism Sister      Prior to Admission medications   Medication Sig Start Date End Date Taking? Authorizing Provider  acetic acid 0.25 % irrigation Irrigate with as directed daily. Instill 30ml and clamp tube for 30 minutes then drain. 06/03/15  Yes [name], MD  ARIPiprazole (ABILIFY) 5 MG tablet Take 1 tablet (5 mg total) by mouth daily. 05/19/18  Yes York Spaniel, MD  AZO-CRANBERRY PO Take 1 tablet by mouth 2 (two) times daily.   Yes [provider]  baclofen (LIORESAL) 20 MG tablet TAKE 2  TABLETS BY MOUTH THREE TIMES DAILY 07/22/18  Yes York SpanielWillis, Charles K, MD  buPROPion (WELLBUTRIN XL) 150 MG 24 hr tablet 2 TABLETS DAILY 09/03/18  Yes York SpanielWillis, Charles K, MD  clotrimazole-betamethasone (LOTRISONE) cream Apply 1 application topically 2 (two) times daily. 09/03/18  Yes [provider]  diazepam (VALIUM) 5 MG tablet TAKE 1 TABLET BY MOUTH THREE TIMES DAILY Patient taking differently: Take 5 mg by mouth every 12 (twelve) hours as needed for anxiety or muscle spasms.  07/14/18  Yes York SpanielWillis, Charles K, MD  fosfomycin (MONUROL) 3 g PACK Take 3 g by mouth. Three times weekly   Yes [provider]  levothyroxine (SYNTHROID, LEVOTHROID) 125 MCG tablet Take 125 mcg by mouth daily. 05/22/15  Yes [provider]    lisinopril-hydrochlorothiazide (PRINZIDE,ZESTORETIC) 10-12.5 MG tablet Take 0.5 tablets by mouth daily. 04/23/18  Yes Narda BondsNettey, Ralph A, MD  magnesium citrate SOLN Take 0.5 Bottles by mouth daily as needed for moderate constipation or severe constipation.   Yes [provider]  omeprazole (PRILOSEC) 20 MG capsule Take 20 mg by mouth every morning.    Yes [provider]  polyethylene glycol (MIRALAX / GLYCOLAX) packet Take 17 g by mouth daily. Patient taking differently: Take 17 g by mouth as needed.  04/24/18  Yes Narda BondsNettey, Ralph A, MD  tiZANidine (ZANAFLEX) 4 MG tablet TAKE 2 TABLETS BY MOUTH THREE TIMES DAILY.  PLEASE CALL [phone] TO SCHEDULE AN APPOINTMENT Patient taking differently: Take 4 mg by mouth 3 (three) times daily.  07/01/18  Yes York SpanielWillis, Charles K, MD  topiramate (TOPAMAX) 25 MG tablet Take one tablet at night for one week, then take 2 tablets at night 05/19/18  Yes York SpanielWillis, Charles K, MD  traMADol (ULTRAM) 50 MG tablet TAKE 1 TABLET BY MOUTH EVERY 6 HOURS AS NEEDED Patient taking differently: Take 50 mg by mouth every 6 (six) hours as needed for moderate pain.  02/18/18  Yes York SpanielWillis, Charles K, MD  trazodone (DESYREL) 300 MG tablet TAKE 1 TABLET BY MOUTH ONCE DAILY AT BEDTIME 05/27/18  Yes York SpanielWillis, Charles K, MD  Vitamin D, Ergocalciferol, (DRISDOL) 50000 UNITS CAPS Take 50,000 Units by mouth every 7 (seven) days. Friday only   Yes [provider]  Vitamins A & D (VITAMIN A & D) ointment Apply 1 application topically as needed for dry skin.   Yes [provider]  Hydrocortisone (GERHARDT'S BUTT CREAM) CREA Apply 1 application topically 3 (three) times daily. Patient not taking: Reported on 09/16/2018 04/23/18   Narda BondsNettey, Ralph A, MD  tamsulosin (FLOMAX) 0.4 MG CAPS capsule Take 1 capsule (0.4 mg total) by mouth daily. Patient not taking: Reported on 09/16/2018 12/29/17   Crista ElliotBell, Eugene D III, MD     Objective     Physical Exam: Vitals:   09/16/18 [id number]  09/16/18 0952 09/16/18 1045 09/16/18 1203  BP:  135/86  (!) 128/92  Pulse:  89  (!) 101  Resp:  20  16  Temp:  98.3 F (36.8 C) 98.5 F (36.9 C)   TempSrc:  Oral Rectal   SpO2:  98%  98%  Weight: 69.7 kg       General: appears to be stated age; alert, oriented Skin: warm, dry Head:  AT/[place] Eyes:  PEARL b/l, EOMI Mouth:  Oral mucosa membranes appear moist, normal dentition Neck: supple; trachea midline Heart:  RRR; did not appreciate any M/R/G Lungs: CTAB, did not appreciate any wheezes, rales, or rhonchi Abdomen: + BS; soft, ND, NT Extremities: no  peripheral edema, no muscle wasting   Labs on Admission: I have personally reviewed following labs and imaging studies  CBC: Recent Labs  Lab 09/16/18 1200  WBC 8.9  NEUTROABS 6.1  HGB 16.9  HCT 53.5*  MCV 90.2  PLT 257   Basic Metabolic Panel: Recent Labs  Lab 09/16/18 1200  NA 138  K 3.0*  CL 98  CO2 27  GLUCOSE 81  BUN 18  CREATININE 0.79  CALCIUM 10.1   GFR: Estimated Creatinine Clearance: 100.4 mL/min (by C-G formula based on SCr of 0.79 mg/dL). Liver Function Tests: Recent Labs  Lab 09/16/18 1200  AST 25  ALT 41  ALKPHOS 147*  BILITOT 0.7  PROT 9.0*  ALBUMIN 5.4*   No results for input(s): LIPASE, AMYLASE in the last 168 hours. No results for input(s): AMMONIA in the last 168 hours. Coagulation Profile: No results for input(s): INR, PROTIME in the last 168 hours. Cardiac Enzymes: No results for input(s): CKTOTAL, CKMB, CKMBINDEX, TROPONINI in the last 168 hours. BNP (last 3 results) No results for input(s): PROBNP in the last 8760 hours. HbA1C: No results for input(s): HGBA1C in the last 72 hours. CBG: No results for input(s): GLUCAP in the last 168 hours. Lipid Profile: No results for input(s): CHOL, HDL, LDLCALC, TRIG, CHOLHDL, LDLDIRECT in the last 72 hours. Thyroid Function Tests: No results for input(s): TSH, T4TOTAL, FREET4, T3FREE, THYROIDAB in the last 72 hours. Anemia Panel: No  results for input(s): VITAMINB12, FOLATE, FERRITIN, TIBC, IRON, RETICCTPCT in the last 72 hours. Urine analysis:    Component Value Date/Time   COLORURINE YELLOW 09/16/2018 1046   APPEARANCEUR CLOUDY (A) 09/16/2018 1046   LABSPEC 1.003 (L) 09/16/2018 1046   PHURINE 7.0 09/16/2018 1046   GLUCOSEU NEGATIVE 09/16/2018 1046   HGBUR MODERATE (A) 09/16/2018 1046   BILIRUBINUR NEGATIVE 09/16/2018 1046   KETONESUR NEGATIVE 09/16/2018 1046   PROTEINUR NEGATIVE 09/16/2018 1046   UROBILINOGEN 0.2 06/02/2015 0459   NITRITE NEGATIVE 09/16/2018 1046   LEUKOCYTESUR LARGE (A) 09/16/2018 1046    Radiological Exams on Admission: Dg Chest 2 View  Result Date: 09/16/2018 CLINICAL DATA:  Shortness of breath. EXAM: CHEST - 2 VIEW COMPARISON:  PA and lateral chest 04/20/2018. FINDINGS: The lungs are clear. Heart size is normal. No pneumothorax or pleural fluid. No acute or focal bony abnormality. The patient distal right forearm and hand over the right chest as he is unable to move his right arm. IMPRESSION: No acute disease. Electronically Signed   By: Drusilla Kanner M.D.   On: 09/16/2018 10:54     Assessment/Plan   Samuel [name] is a 58 y.o. male who is chronically bedbound with medical history significant for multiple sclerosis, recurrent urinary tract infections, urinary retention on the basis of neurogenic bladder, who is admitted to [facility] long hospital on 1 15,020 with acute metabolic encephalopathy after presenting from home to the [facility] emergency department for evaluation of confusion.    Principal Problem:   Acute metabolic encephalopathy Active Problems:   Neurogenic bladder   Essential hypertension   Acute lower UTI   Hypokalemia    #) Acute metabolic encephalopathy: Patient presents from home to the emergency department with 1 day of confusion and visual hallucinations, which appears to be metabolic in etiology on the basis of underlying urinary tract infection, with,  per family multiple similar presentations in the setting of recurrent UTI's.  Mental status appears to be improving, and now at baseline per family present at bedside  following initiation of IV antibiotics in the ED today.  Relative to baseline, no evidence of acute focal neurologic deficits at this time.  No apparent recent changes to outpatient medication regimen, rendering pharmacologic contributions to be less likely.  No other source of infection identified at this time.   Plan: Nursing bedside swallow evaluation prior to initiation of diet.  Work-up and management of complicated urinary tract infection, as further describe below.  While appearing to be less likely, will try to limit any potential pharmacologic influences, by holding home PRN Valium as well as trazodone for now.  In the context of a history of acquired hypothyroidism, will also check reflex TSH.  We will also check VBG, ammonia level, and urinary drug screen.  Repeat CMP as well as CBC in the morning.    #) Complicated UTI: Presenting urinalysis notable for greater than 50 white blood cells with large leukocyte esterase.  Patient has a history of recurrent urinary tract infections, with risk factors for such including urinary retention and secondary to neurogenic bladder in the context of multiple sclerosis as well as resultant chronic indwelling Foley catheter itself.  Of note, the patient is not septic at this time.  Given a history of allergies to the penicillin class as well as a history of ESBL urinary tract infections, will continue Levaquin initiated in the ED.  Given patient's family's report of a history of obstructive ureterolithiasis requiring cystoscopy with ureteral stent placement as well as they report of patient complaining of 2 to 3 days of left flank discomfort, will also pursue KUB.  By definition in the context of patient's gender, his urinary tract infection is considered complicated in nature, and will thus require  10 to 14 days of antibiotic coverage.  Of note, blood cultures x2 were collected in the ED today prior to initiation of antibiotics.  Plan: Continue Levaquin, as further described above.  Will monitor closely for results of blood cultures x2 as well as urine culture collected in the ED today.  Repeat CBC tomorrow morning.  KUB, as described above.     #) Hypokalemia: Presenting labs reflect mildly depressed serum potassium of 3.0.  The patient received potassium chloride 40 mEq p.o. x1 in the ED today.  Plan: We will provide 20 additional equivalents of IV potassium over 2 hours x 1 now.  Repeat BMP in the morning.  We will also add on serum magnesium level to labs collected in the ED today.    #) Acquired hypothyroidism: On Synthroid as an outpatient.  Plan: We will continue home Synthroid for now.  We will also check TSH In context of presenting acute encephalopathy.     #) Essential hypertension: On lisinopril as well as HCTZ as an outpatient.  Normotensive since presenting to the ED today.  Plan: We will hold home antihypertensives in the context of concomitant presenting urinary tract infection.    DVT prophylaxis: Lovenox 4 mg subcu daily Code Status: full Family Communication: Patient's case was discussed with his wife as well as his daughter, both of whom are present at bedside. Disposition Plan: Per Rounding Team Consults called: (none)  Admission status: Inpatient; MedSurg   PLEASE NOTE THAT DRAGON DICTATION SOFTWARE WAS USED IN THE CONSTRUCTION OF THIS NOTE.   Angie FavaJustin B Jaydyn Menon DO [facility] Pager [phone] From 12PM- 8PM.   Otherwise, please contact night-coverage  [email or web address] Password [email or web address]  09/16/2018, 1:39 PM

## 2018-09-16 NOTE — ED Notes (Signed)
Phlebotomy notified need for 2nd set of cultures.  

## 2018-09-16 NOTE — ED Notes (Signed)
Attempted to obtain IV access. Unsuccessful. Royce RN made aware. Royce RN to attempt

## 2018-09-16 NOTE — Progress Notes (Signed)
Called to receive report from [place], Charity fundraiser at [phone]

## 2018-09-16 NOTE — ED Notes (Signed)
ED TO INPATIENT HANDOFF REPORT  Name/Age/Gender Samuel [name] 58 y.o. male  Code Status    Code Status Orders  (From admission, onward)         Start     Ordered   09/16/18 1412  Full code  Continuous     09/16/18 1413        Code Status History    Date Active Date Inactive Code Status Order ID Comments User Context   04/21/2018 0105 04/23/2018 1725 Full Code [id number]  Therisa Doyne, MD Inpatient   12/29/2017 1602 01/01/2018 1855 Full Code [id number]  [facility] Inpatient   12/29/2017 1602 12/29/2017 1602 Full Code [id number]  [name], MD Inpatient   12/10/2017 0018 12/12/2017 2015 Full Code [id number]  Carron Curie, MD Inpatient   10/10/2015 2306 10/13/2015 1911 Full Code [id number]  Eduard Clos, MD Inpatient   06/02/2015 0904 06/05/2015 2257 Full Code [id number]  Richarda Overlie, MD ED   07/10/2014 1322 07/14/2014 2050 Full Code [id number]  Osvaldo Shipper, MD Inpatient   03/02/2014 2236 03/03/2014 1750 Full Code [id number]  Haydee Monica, MD Inpatient    Advance Directive Documentation     Most Recent Value  Type of Advance Directive  Healthcare Power of Attorney  Pre-existing out of facility DNR order (yellow form or pink MOST form)  -  "MOST" Form in Place?  -      Home/SNF/Other Home  Chief Complaint fever;disorientation  Level of Care/Admitting Diagnosis ED Disposition    ED Disposition Condition Comment   Admit  Hospital Area: [facility] [[id number]]  Level of Care: Med-Surg [16]  Diagnosis: Acute metabolic encephalopathy [4128786]  Admitting Physician: Angie Fava [[id number]]  Attending Physician: Angie Fava [[id number]]  Estimated length of stay: past midnight tomorrow  Certification:: I certify this patient will need inpatient services for at least 2 midnights  PT Class (Do Not Modify): Inpatient [101]  PT Acc Code (Do Not Modify): Private [1]       Medical History Past Medical History:  Diagnosis Date  .  Anxiety   . Bladder calculi   . Chronic back pain   . Chronic fatigue   . Depression   . Foley catheter in place   . Gait disorder   . GERD (gastroesophageal reflux disease)   . History of kidney stones   . Hyperlipidemia   . Hypertension   . Hypothyroidism   . Incontinence of urine   . Multiple sclerosis (HCC) NEUROLOGIST-- [name]   dx 1993, JC virus negative 2/14---  CURRENTLY RECEIVING TYSABRI IV TREATMENT  . Neurogenic bladder   . Neurogenic bowel    intermittant constipation and diarrhea  . Pneumonia    dx  03-03-2014--  admitted for iv and oral antibiotics  . 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 Urinary retention   . Vitamin D deficiency   . Yeast infection    genital area    Allergies Allergies  Allergen Reactions  . Penicillins Other (See Comments)    > 20 yr ago, doesn't recall nature of reaction (didn't require hospitalization) Tolerates Ancef, Rocephin, and Cefepime per chart review Does not recall having taken amox/amp since initial rxn; none documented in Epic   . Primaxin [Imipenem] Other (See Comments)    Seizure     IV Location/Drains/Wounds Patient Lines/Drains/Airways Status   Active Line/Drains/Airways    Name:   Placement date:   Placement time:   Site:   Days:  Peripheral IV 09/16/18 Left Forearm   09/16/18    1156    Forearm   less than 1   Suprapubic Catheter Double-lumen;Latex 16 Fr.   12/11/17    1545    Double-lumen;Latex   279   Ureteral Drain/Stent Right ureter 6 Fr.   12/29/17    1256    (S) Right ureter   261   External Urinary Catheter   04/21/18    0130    -   148   Pressure Injury 04/21/18 Stage II -  Partial thickness loss of dermis presenting as a shallow open ulcer with a red, pink wound bed without slough. 3 seperate areas close proximity on the left buttock    04/21/18    0130     148          Labs/Imaging Results for orders placed or performed during the hospital encounter of 09/16/18 (from the past 48 hour(s))   Urinalysis, Routine w reflex microscopic     Status: Abnormal   Collection Time: 09/16/18 10:46 AM  Result Value Ref Range   Color, Urine YELLOW YELLOW   APPearance CLOUDY (A) CLEAR   Specific Gravity, Urine 1.003 (L) 1.005 - 1.030   pH 7.0 5.0 - 8.0   Glucose, UA NEGATIVE NEGATIVE mg/dL   Hgb urine dipstick MODERATE (A) NEGATIVE   Bilirubin Urine NEGATIVE NEGATIVE   Ketones, ur NEGATIVE NEGATIVE mg/dL   Protein, ur NEGATIVE NEGATIVE mg/dL   Nitrite NEGATIVE NEGATIVE   Leukocytes, UA LARGE (A) NEGATIVE   RBC / HPF 11-20 0 - 5 RBC/hpf   WBC, UA >50 (H) 0 - 5 WBC/hpf   Bacteria, UA RARE (A) NONE SEEN    Comment: Performed at [facility], [address] [place], [place] [address]  CBC with Differential/Platelet     Status: Abnormal   Collection Time: 09/16/18 12:00 PM  Result Value Ref Range   WBC 8.9 4.0 - 10.5 K/uL   RBC 5.93 (H) 4.22 - 5.81 MIL/uL   Hemoglobin 16.9 13.0 - 17.0 g/dL   HCT 78.4 (H) 12.8 - 20.8 %   MCV 90.2 80.0 - 100.0 fL   MCH 28.5 26.0 - 34.0 pg   MCHC 31.6 30.0 - 36.0 g/dL   RDW 13.8 87.1 - 95.9 %   Platelets 257 150 - 400 K/uL   nRBC 0.0 0.0 - 0.2 %   Neutrophils Relative % 69 %   Neutro Abs 6.1 1.7 - 7.7 K/uL   Lymphocytes Relative 21 %   Lymphs Abs 1.9 0.7 - 4.0 K/uL   Monocytes Relative 8 %   Monocytes Absolute 0.7 0.1 - 1.0 K/uL   Eosinophils Relative 1 %   Eosinophils Absolute 0.0 0.0 - 0.5 K/uL   Basophils Relative 0 %   Basophils Absolute 0.0 0.0 - 0.1 K/uL   Immature Granulocytes 1 %   Abs Immature Granulocytes 0.09 (H) 0.00 - 0.07 K/uL    Comment: Performed at [facility], [address], [place], [place] [address]  Comprehensive metabolic panel     Status: Abnormal   Collection Time: 09/16/18 12:00 PM  Result Value Ref Range   Sodium 138 135 - 145 mmol/L   Potassium 3.0 (L) 3.5 - 5.1 mmol/L   Chloride 98 98 - 111 mmol/L   CO2 27 22 - 32 mmol/L   Glucose, Bld 81 70 - 99 mg/dL   BUN 18 6 - 20  mg/dL   Creatinine, Ser 5.50  0.61 - 1.24 mg/dL   Calcium 16.110.1 8.9 - 09.610.3 mg/dL   Total Protein 9.0 (H) 6.5 - 8.1 g/dL   Albumin 5.4 (H) 3.5 - 5.0 g/dL   AST 25 15 - 41 U/L   ALT 41 0 - 44 U/L   Alkaline Phosphatase 147 (H) 38 - 126 U/L   Total Bilirubin 0.7 0.3 - 1.2 mg/dL   GFR calc non Af Amer >60 >60 mL/min   GFR calc Af Amer >60 >60 mL/min   Anion gap 13 5 - 15    Comment: Performed at [facility], [address] [place], [place] [address]  Magnesium     Status: None   Collection Time: 09/16/18 12:00 PM  Result Value Ref Range   Magnesium 2.2 1.7 - 2.4 mg/dL    Comment: Performed at [facility], [address] [place], [place] [address]  I-Stat CG4 Lactic Acid, ED     Status: Abnormal   Collection Time: 09/16/18 12:03 PM  Result Value Ref Range   Lactic Acid, Venous 1.99 (H) 0.5 - 1.9 mmol/L   Dg Chest 2 View  Result Date: 09/16/2018 CLINICAL DATA:  Shortness of breath. EXAM: CHEST - 2 VIEW COMPARISON:  PA and lateral chest 04/20/2018. FINDINGS: The lungs are clear. Heart size is normal. No pneumothorax or pleural fluid. No acute or focal bony abnormality. The patient distal right forearm and hand over the right chest as he is unable to move his right arm. IMPRESSION: No acute disease. Electronically Signed   By: Drusilla Kannerhomas  Dalessio M.D.   On: 09/16/2018 10:54   None  Pending Labs Unresulted Labs (From admission, onward)    Start     Ordered   09/17/18 0500  TSH  Tomorrow morning,   R     09/16/18 1413   09/17/18 0500  Basic metabolic panel  Tomorrow morning,   R     09/16/18 1413   09/17/18 0500  CBC  Tomorrow morning,   R     09/16/18 1413   09/17/18 0500  Blood gas, venous  Tomorrow morning,   R     09/16/18 1414   09/17/18 0500  Ammonia  Tomorrow morning,   R     09/16/18 1414   09/16/18 1415  Urine rapid drug screen (hosp performed)  Add-on,   R     09/16/18 1414   09/16/18 1316  Lactic acid, plasma  ONCE - STAT,   R      09/16/18 1315   09/16/18 1032  Culture, blood (Routine X 2) w Reflex to ID Panel  BLOOD CULTURE X 2,   R     09/16/18 1031   09/16/18 0958  Urine Culture  ONCE - STAT,   STAT     09/16/18 0957          Vitals/Pain Today's Vitals   09/16/18 1430 09/16/18 1503 09/16/18 1600 09/16/18 1630  BP: 138/82  139/90 (!) 129/99  Pulse:   (!) 131   Resp:   20   Temp:      TempSrc:      SpO2:   95%   Weight:      PainSc:  6       Isolation Precautions No active isolations  Medications Medications  traMADol (ULTRAM) tablet 50 mg (50 mg Oral Given 09/16/18 1556)  ARIPiprazole (ABILIFY) tablet 5 mg (has no administration in time range)  buPROPion (WELLBUTRIN XL) 24 hr tablet 300 mg (  has no administration in time range)  levothyroxine (SYNTHROID, LEVOTHROID) tablet 125 mcg (has no administration in time range)  pantoprazole (PROTONIX) EC tablet 40 mg (has no administration in time range)  polyethylene glycol (MIRALAX / GLYCOLAX) packet 17 g (has no administration in time range)  baclofen (LIORESAL) tablet 40 mg (40 mg Oral Given 09/16/18 1617)  tiZANidine (ZANAFLEX) tablet 4 mg (4 mg Oral Given 09/16/18 1617)  topiramate (TOPAMAX) tablet 50 mg (has no administration in time range)  enoxaparin (LOVENOX) injection 40 mg (has no administration in time range)  acetaminophen (TYLENOL) tablet 650 mg (has no administration in time range)    Or  acetaminophen (TYLENOL) suppository 650 mg (has no administration in time range)  ondansetron (ZOFRAN) tablet 4 mg (has no administration in time range)    Or  ondansetron (ZOFRAN) injection 4 mg (has no administration in time range)  levofloxacin (LEVAQUIN) IVPB 500 mg (has no administration in time range)  potassium chloride 10 mEq in 100 mL IVPB (10 mEq Intravenous New Bag/Given 09/16/18 1622)  lactated ringers infusion ( Intravenous New Bag/Given 09/16/18 1620)  potassium chloride SA (K-DUR,KLOR-CON) CR tablet 40 mEq (40 mEq Oral Given 09/16/18 1337)   oxyCODONE-acetaminophen (PERCOCET/ROXICET) 5-325 MG per tablet 1 tablet (1 tablet Oral Given 09/16/18 1441)    Mobility non-ambulatory

## 2018-09-16 NOTE — ED Provider Notes (Signed)
[facility]-EMERGENCY DEPT Provider Note   CSN: [id number] Arrival date & time: 09/16/18  [id number]     History   Chief Complaint Chief Complaint  Patient presents with  . Altered Mental Status    HPI Samuel [name] is a 58 y.o. male.  58 year old male with history of multiple sclerosis presents with confusion x2 days.  Has a history of multiple UTIs and this is similar.  Subjective fever without emesis.  Slight back pain.  No cough or congestion.  Denies any abdominal discomfort.  Called EMS and was transported here     Past Medical History:  Diagnosis Date  . Anxiety   . Bladder calculi   . Chronic back pain   . Chronic fatigue   . Depression   . Foley catheter in place   . Gait disorder   . GERD (gastroesophageal reflux disease)   . History of kidney stones   . Hyperlipidemia   . Hypertension   . Hypothyroidism   . Incontinence of urine   . Multiple sclerosis (HCC) NEUROLOGIST-- DR Anne HahnWILLIS   dx 1993, JC virus negative 2/14---  CURRENTLY RECEIVING TYSABRI IV TREATMENT  . Neurogenic bladder   . Neurogenic bowel    intermittant constipation and diarrhea  . Pneumonia    dx  03-03-2014--  admitted for iv and oral antibiotics  . 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 Urinary retention   . Vitamin D deficiency   . Yeast infection    genital area    Patient Active Problem List   Diagnosis Date Noted  . Pressure injury of skin 04/21/2018  . Acute lower UTI 04/21/2018  . Ureteral calculus 12/29/2017  . Ureteral stone 12/29/2017  . Sepsis (HCC) 12/09/2017  . Seizure (HCC)   . Essential hypertension 10/10/2015  . Nausea & vomiting 10/10/2015  . Malfunction of Foley catheter (HCC)   . Malnutrition of moderate degree (HCC) 07/14/2014  . History of ESBL Klebsiella pneumoniae infection 07/14/2014  . UTI (urinary tract infection) 07/10/2014  . Neurogenic bladder 03/21/2014  . Acute retention of urine 03/03/2014  . PNA (pneumonia) 03/02/2014  .  Pneumonia 03/02/2014  . Multiple sclerosis (HCC) 04/19/2013  . 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 APPENDECTOMY  age 58  . CYSTOSCOPY N/A 03/21/2014   Procedure: CYSTOSCOPY TREATMENT OF BLADDER CALCULI;  Surgeon: Valetta Fulleravid S Grapey, MD;  [facility];  Service: Urology;  Laterality: N/A;  . CYSTOSCOPY WITH BIOPSY N/A 12/29/2017   Procedure: TRANSURETHRAL RESECTION OF BLADDER TUMOR;  Surgeon: Crista ElliotBell, Eugene D III, MD;  Location: [facility];  Service: Urology;  Laterality: N/A;  . CYSTOSCOPY WITH RETROGRADE PYELOGRAM, URETEROSCOPY AND STENT PLACEMENT Right 12/11/2017   Procedure: CYSTOSCOPY WITH RETROGRADE PYELOGRAM, URETEROSCOPY AND STENT PLACEMENT RIGHT;  Surgeon: Jerilee FieldEskridge, Matthew, MD;  Location: [facility];  Service: Urology;  Laterality: Right;  . CYSTOSCOPY WITH RETROGRADE PYELOGRAM, URETEROSCOPY AND STENT PLACEMENT Right 12/29/2017   Procedure: CYSTOSCOPY WITH RIGHT  RETROGRADE PYELOGRAM, URETEROSCOPY/LASER LITHOTRIPSY AND STENT PLACEMENT;  Surgeon: Crista ElliotBell, Eugene D III, MD;  Location: [facility];  Service: Urology;  Laterality: Right;  . INSERTION OF SUPRAPUBIC CATHETER N/A 03/21/2014   Procedure: INSERTION OF SUPRAPUBIC CATHETER;  Surgeon: Valetta Fulleravid S Grapey, MD;  [facility] ;  Service: Urology;  Laterality: N/A;  . NASAL SEPTUM SURGERY  1996  . NEGATIVE SLEEP STUDY  1996  . TRANSTHORACIC ECHOCARDIOGRAM  02-07-2004  MILD LVH/  EF 55-65%        Home Medications    Prior to Admission medications   Medication Sig Start Date End Date Taking? Authorizing Provider  acetic acid 0.25 % irrigation Irrigate with as directed daily. Instill 30ml and clamp tube for 30 minutes then drain. 06/03/15   Bjorn PippinWrenn, John, MD  ARIPiprazole (ABILIFY) 5 MG tablet Take 1 tablet (5 mg total) by mouth daily. 05/19/18   York SpanielWillis, Charles K, MD  AZO-CRANBERRY PO Take 1 tablet by mouth 2 (two) times daily.     [provider]  baclofen (LIORESAL) 20 MG tablet TAKE 2 TABLETS BY MOUTH THREE TIMES DAILY 07/22/18   York SpanielWillis, Charles K, MD  buPROPion (WELLBUTRIN XL) 150 MG 24 hr tablet 2 TABLETS DAILY 09/03/18   York SpanielWillis, Charles K, MD  diazepam (VALIUM) 5 MG tablet TAKE 1 TABLET BY MOUTH THREE TIMES DAILY 07/14/18   York SpanielWillis, Charles K, MD  docusate sodium (COLACE) 250 MG capsule Take 250 mg by mouth daily as needed for constipation.     [provider]  Hydrocortisone (GERHARDT'S BUTT CREAM) CREA Apply 1 application topically 3 (three) times daily. 04/23/18   Narda BondsNettey, Ralph A, MD  levothyroxine (SYNTHROID, LEVOTHROID) 125 MCG tablet Take 125 mcg by mouth daily. 05/22/15   [provider]  lisinopril-hydrochlorothiazide (PRINZIDE,ZESTORETIC) 10-12.5 MG tablet Take 0.5 tablets by mouth daily. 04/23/18   Narda BondsNettey, Ralph A, MD  omeprazole (PRILOSEC) 20 MG capsule Take 20 mg by mouth every morning.     [provider]  oxybutynin (DITROPAN XL) 15 MG 24 hr tablet Take 15 mg by mouth every morning.  03/30/18   [provider]  polyethylene glycol (MIRALAX / GLYCOLAX) packet Take 17 g by mouth daily. Patient taking differently: Take 17 g by mouth as needed.  04/24/18   Narda BondsNettey, Ralph A, MD  tamsulosin (FLOMAX) 0.4 MG CAPS capsule Take 1 capsule (0.4 mg total) by mouth daily. 12/29/17   Ray ChurchBell, Eugene D III, MD  tiZANidine (ZANAFLEX) 4 MG tablet TAKE 2 TABLETS BY MOUTH THREE TIMES DAILY.  PLEASE CALL [phone] TO SCHEDULE AN APPOINTMENT 07/01/18   York SpanielWillis, Charles K, MD  topiramate (TOPAMAX) 25 MG tablet Take one tablet at night for one week, then take 2 tablets at night 05/19/18   York SpanielWillis, Charles K, MD  traMADol (ULTRAM) 50 MG tablet TAKE 1 TABLET BY MOUTH EVERY 6 HOURS AS NEEDED Patient taking differently: Take 50 mg by mouth every 6 (six) hours as needed for moderate pain.  02/18/18   York SpanielWillis, Charles K, MD  trazodone (DESYREL) 300 MG tablet TAKE 1 TABLET BY MOUTH ONCE DAILY AT BEDTIME  05/27/18   York SpanielWillis, Charles K, MD  Vitamin D, Ergocalciferol, (DRISDOL) 50000 UNITS CAPS Take 50,000 Units by mouth every 7 (seven) days. Friday only    [provider]    Family History Family History  Problem Relation Age of Onset  . Stroke Father   . Cirrhosis Brother   . Hypothyroidism Sister     Social History Social History   Tobacco Use  . Smoking status: Former Smoker    Packs/day: 1.50    Years: 8.00    Pack years: 12.00    Types: Cigarettes    Last attempt to quit: 06/17/1985    Years since quitting: 33.2  . Smokeless tobacco: Never Used  Substance Use Topics  . Alcohol use: No    Alcohol/week: 0.0 standard drinks  . Drug use: No     Allergies  Penicillins and Primaxin [imipenem]   Review of Systems Review of Systems  All other systems reviewed and are negative.    Physical Exam Updated Vital Signs BP 135/86 (BP Location: Left Arm)   Pulse 89   Temp 98.3 F (36.8 C) (Oral)   Resp 20   Wt 69.7 kg   SpO2 98%   BMI 20.00 kg/m   Physical Exam Vitals signs and nursing note reviewed.  Constitutional:      General: He is not in acute distress.    Appearance: Normal appearance. He is well-developed. He is not toxic-appearing.  HENT:     Head: Normocephalic and atraumatic.  Eyes:     General: Lids are normal.     Conjunctiva/sclera: Conjunctivae normal.     Pupils: Pupils are equal, round, and reactive to light.  Neck:     Musculoskeletal: Normal range of motion and neck supple.     Thyroid: No thyroid mass.     Trachea: No tracheal deviation.  Cardiovascular:     Rate and Rhythm: Regular rhythm. Tachycardia present.     Heart sounds: Normal heart sounds. No murmur. No gallop.   Pulmonary:     Effort: Pulmonary effort is normal. No respiratory distress.     Breath sounds: Normal breath sounds. No stridor. No decreased breath sounds, wheezing, rhonchi or rales.  Abdominal:     General: Bowel sounds are normal. There is no distension.       Palpations: Abdomen is soft.     Tenderness: There is no abdominal tenderness. There is no rebound.  Musculoskeletal: Normal range of motion.        General: No tenderness.  Skin:    General: Skin is warm and dry.     Findings: No abrasion or rash.  Neurological:     Mental Status: He is oriented to person, place, and time. He is lethargic.     GCS: GCS eye subscore is 4. GCS verbal subscore is 5. GCS motor subscore is 6.     Cranial Nerves: No cranial nerve deficit.     Comments: Patient's neurological assessment at baseline according to him.  Psychiatric:        Attention and Perception: Attention normal.      ED Treatments / Results  Labs (all labs ordered are listed, but only abnormal results are displayed) Labs Reviewed  URINE CULTURE  CULTURE, BLOOD (ROUTINE X 2)  CULTURE, BLOOD (ROUTINE X 2)  CBC WITH DIFFERENTIAL/PLATELET  COMPREHENSIVE METABOLIC PANEL  URINALYSIS, ROUTINE W REFLEX MICROSCOPIC  I-STAT CG4 LACTIC ACID, ED    EKG None  Radiology No results found.  Procedures Procedures (including critical care time)  Medications Ordered in ED Medications  0.9 %  sodium chloride infusion (has no administration in time range)     Initial Impression / Assessment and Plan / ED Course  I have reviewed the triage vital signs and the nursing notes.  Pertinent labs & imaging results that were available during my care of the patient were reviewed by me and considered in my medical decision making (see chart for details).     Patient's lab results reviewed and shows mild hypokalemia and this was treated with oral potassium.  No leukocytosis on CBC however her urinalysis is consistent with UTI.  Patient started on IV antibiotics with Levaquin due to penicillin allergy and will admit to the hospitalist service  Final Clinical Impressions(s) / ED Diagnoses   Final diagnoses:  SOB (shortness of breath)  ED Discharge Orders    None       Lorre Nick,  MD 09/16/18 1317

## 2018-09-16 NOTE — ED Notes (Signed)
Bed: WA03 Expected date:  Expected time:  Means of arrival:  Comments: EMS/Elderly

## 2018-09-16 NOTE — Progress Notes (Signed)
CRITICAL VALUE STICKER  CRITICAL VALUE: Lactic Acid 2.1  RECEIVER (on-site recipient of call): Alma Muegge RN  DATE & TIME NOTIFIED: 09/16/18 1930  MESSENGER (representative from lab): Lab Tech   MD NOTIFIED: Kirtland Bouchard Schorr   TIME OF NOTIFICATION: 1940  RESPONSE: Awaiting respond

## 2018-09-16 NOTE — Progress Notes (Signed)
Patient-reported Beta-Lactam Allergy Assessment  Specific drug that caused reaction: Penicillin Reaction(s) that occurred: does not recall  Antibiotics tolerated in the past: . Patient reported:  Unable to recall . Historical data obtained from the EMR:  Cefazolin, Cefepime and Ceftriaxone  Information received from:  Medical record   Due to reported unknown reaction, the following questions were asked about the Penicillin allergy:  How long after taking the drug did the reaction occur?  Unknown/ cannot recall  How long ago did the reaction occur?  Unknown/ childhood reaction  What was done to manage the reaction?  Unknown    Based on the above interview, it has been deemed appropriate for patient to be administered Ceftriaxone as the risk for cross-reactivity to documented reaction is low.  Bernadene Person, PharmD, BCPS [phone] 09/16/2018, 1:36 PM

## 2018-09-17 ENCOUNTER — Inpatient Hospital Stay (HOSPITAL_COMMUNITY): Payer: Medicare Other

## 2018-09-17 DIAGNOSIS — G9341 Metabolic encephalopathy: Secondary | ICD-10-CM

## 2018-09-17 LAB — BASIC METABOLIC PANEL
Anion gap: 10 (ref 5–15)
BUN: 10 mg/dL (ref 6–20)
CO2: 22 mmol/L (ref 22–32)
Calcium: 9.2 mg/dL (ref 8.9–10.3)
Chloride: 105 mmol/L (ref 98–111)
Creatinine, Ser: 0.58 mg/dL — ABNORMAL LOW (ref 0.61–1.24)
GFR calc Af Amer: >60 mL/min (ref >60)
GFR calc non Af Amer: >60 mL/min (ref >60)
Glucose, Bld: 86 mg/dL (ref 70–99)
Potassium: 3.7 mmol/L (ref 3.5–5.1)
Sodium: 137 mmol/L (ref 135–145)

## 2018-09-17 LAB — URINE CULTURE

## 2018-09-17 LAB — BLOOD GAS, VENOUS
Acid-Base Excess: 1.4 mmol/L (ref 0.0–2.0)
Bicarbonate: 25 mmol/L (ref 20.0–28.0)
O2 Saturation: 92.7 %
Patient temperature: 98.6
pCO2, Ven: 37.9 mmHg — ABNORMAL LOW (ref 44.0–60.0)
pH, Ven: 7.435 — ABNORMAL HIGH (ref 7.250–7.430)
pO2, Ven: 61.3 mmHg — ABNORMAL HIGH (ref 32.0–45.0)

## 2018-09-17 LAB — CBC
HCT: 45.2 % (ref 39.0–52.0)
Hemoglobin: 14.3 g/dL (ref 13.0–17.0)
MCH: 29.8 pg (ref 26.0–34.0)
MCHC: 31.6 g/dL (ref 30.0–36.0)
MCV: 94.2 fL (ref 80.0–100.0)
Platelets: 184 10*3/uL (ref 150–400)
RBC: 4.8 MIL/uL (ref 4.22–5.81)
RDW: 13.2 % (ref 11.5–15.5)
WBC: 10.1 10*3/uL (ref 4.0–10.5)
nRBC: 0 % (ref 0.0–0.2)

## 2018-09-17 LAB — TSH: TSH: 7.893 u[IU]/mL — ABNORMAL HIGH (ref 0.350–4.500)

## 2018-09-17 LAB — AMMONIA: Ammonia: 26 umol/L (ref 9–35)

## 2018-09-17 MED ORDER — ENSURE ENLIVE PO LIQD
237.0000 mL | Freq: Two times a day (BID) | ORAL | Status: DC
  Administered 2018-09-17 (×2): 237 mL via ORAL

## 2018-09-17 MED ORDER — LEVOFLOXACIN 500 MG PO TABS
500.0000 mg | ORAL_TABLET | Freq: Every day | ORAL | Status: DC
  Administered 2018-09-17: 500 mg via ORAL
  Filled 2018-09-17: qty 1

## 2018-09-17 NOTE — Progress Notes (Signed)
Pharmacy IV to PO conversion  This patient is receiving Levofloxacin by the intravenous route. Based on criteria approved by the Pharmacy and Therapeutics Committee, and the Infectious Disease Division, the antibiotic(s) is/are being converted to equivalent oral dose form(s). These criteria include:   Patient being treated for a respiratory tract infection, urinary tract infection, cellulitis, or Clostridium Difficile Associated Diarrhea  The patient is not neutropenic and does not exhibit a GI malabsorption state  The patient is eating (either orally or per tube) and/or has been taking other orally administered medications for at least 24 hours.  The patient is improving clinically (physician assessment and a 24-hour Tmax of <=100.5 F)  If you have any questions about this conversion, please contact the Pharmacy Department (ext [phone]).  Thank you.  Bernadene Person, PharmD, BCPS [phone] 09/17/2018, 3:30 PM

## 2018-09-17 NOTE — Progress Notes (Signed)
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   Assessment & Plan:   Principal Problem:   Acute metabolic encephalopathy Active Problems:   Neurogenic bladder   Essential hypertension   Acute lower UTI   Hypokalemia   #) Acute metabolic encephalopathy/likely UTI/neurogenic bladder/BPH: Patient's mental status is now returned to baseline.  He has a history of enterococcus as well as various assorted gram-negative rods. -Continue IV levofloxacin started 09/16/2018 -Follow-up urine culture ordered 09/16/2018 -Follow-up blood culture ordered 09/16/2018 -Continue tamsulosin 0.4 mg daily  #) Primary progressive multiple sclerosis complicated by spasticity/neurogenic bladder: -Continue baclofen 40 mg 3 times daily -Continue diazepam 5 mg every 12 hours as needed  #) Hypertension: - Continue to hold lisinopril-HCTZ 5-6.25 mg daily  #) Hypothyroidism: - Continue levothyroxine 125 mcg  #) Pain/psych: - Continue trazodone 300 mg nightly - Continue tramadol as needed for pain -Continue topiramate 50 mg nightly - Continue bupropion 300 mg daily -Continue aripiprazole 5 mg daily  Fluids: Tolerating p.o. Electrolytes: Monitor and supplement Nutrition: Regular diet   Prophylaxis: Enoxaparin   Disposition: Ending urine culture  Full code   Consultants:   None  Procedures:   None  Antimicrobials:   IV levofloxacin started 09/16/2018   Subjective: This morning patient ports he is feeling fairly well.  He does not have any complaints.  He denies any nausea, vomiting, diarrhea, cough, congestion, rhinorrhea.  He appears to be at his  baseline.  Objective: Vitals:   09/16/18 1630 09/16/18 1805 09/16/18 2053 09/17/18 0504  BP: (!) 129/99 120/89 121/90 (!) 149/104  Pulse:  (!) 116 99 90  Resp:  20 20 18   Temp:  98.7 F (37.1 C) 98.2 F (36.8 C) 98.4 F (36.9 C)  TempSrc:  Oral  Oral  SpO2:  97% 95% 96%  Weight:  65.7 kg  66 kg  Height:  6' (1.829 m)      Intake/Output Summary (Last 24 hours) at 09/17/2018 1031 Last data filed at 09/17/2018 0813 Gross per 24 hour  Intake 676.94 ml  Output 700 ml  Net -23.06 ml   Filed Weights   09/16/18 0948 09/16/18 1805 09/17/18 0504  Weight: 69.7 kg 65.7 kg 66 kg    Examination:  General exam: Appears calm and comfortable  Respiratory system: Clear to auscultation. Respiratory effort normal. Cardiovascular system: Distant heart sounds, regular rate and rhythm, no murmurs Gastrointestinal system: Soft, nondistended, no rebound or guarding, plus bowel sounds. Central nervous system: Alert and oriented to person, place, time and situation.  Noted to have bilateral upper extremity contractures and lower extremity weakness and contractures Extremities: No lower extremity edema Skin: No rashes over visible skin Psychiatry: Judgement and insight appear normal. Mood & affect appropriate.     Data Reviewed: I have personally reviewed following labs and imaging studies  CBC: Recent Labs  Lab 09/16/18 1200 09/17/18 0549  WBC 8.9 10.1  NEUTROABS 6.1  --   HGB 16.9 14.3  HCT 53.5* 45.2  MCV 90.2 94.2  PLT 257 184   Basic Metabolic Panel: Recent Labs  Lab 09/16/18 1200 09/17/18 0549  NA 138 137  K 3.0* 3.7  CL 98 105  CO2 27 22  GLUCOSE 81 86  BUN 18 10  CREATININE 0.79  0.58*  CALCIUM 10.1 9.2  MG 2.2  --    GFR: Estimated Creatinine Clearance: 95.1 mL/min (A) (by C-G formula based on SCr of 0.58 mg/dL (L)). Liver Function Tests: Recent Labs  Lab 09/16/18 1200  AST 25  ALT 41  ALKPHOS 147*  BILITOT 0.7  PROT 9.0*  ALBUMIN 5.4*   No results for  input(s): LIPASE, AMYLASE in the last 168 hours. Recent Labs  Lab 09/17/18 0549  AMMONIA 26   Coagulation Profile: No results for input(s): INR, PROTIME in the last 168 hours. Cardiac Enzymes: No results for input(s): CKTOTAL, CKMB, CKMBINDEX, TROPONINI in the last 168 hours. BNP (last 3 results) No results for input(s): PROBNP in the last 8760 hours. HbA1C: No results for input(s): HGBA1C in the last 72 hours. CBG: No results for input(s): GLUCAP in the last 168 hours. Lipid Profile: No results for input(s): CHOL, HDL, LDLCALC, TRIG, CHOLHDL, LDLDIRECT in the last 72 hours. Thyroid Function Tests: Recent Labs    09/17/18 0549  TSH 7.893*   Anemia Panel: No results for input(s): VITAMINB12, FOLATE, FERRITIN, TIBC, IRON, RETICCTPCT in the last 72 hours. Sepsis Labs: Recent Labs  Lab 09/16/18 1203 09/16/18 1849  LATICACIDVEN 1.99* 2.1*    Recent Results (from the past 240 hour(s))  Urine Culture     Status: None   Collection Time: 09/16/18 10:46 AM  Result Value Ref Range Status   Specimen Description   Final    URINE, CLEAN CATCH Performed at [facility], [address] [place], [place] [address]    Special Requests   Final    NONE Performed at [facility], [address] [place], [place] [address]    Culture   Final    Multiple bacterial morphotypes present, none predominant. Suggest appropriate recollection if clinically indicated.   Report Status 09/17/2018 FINAL  Final  Culture, blood (Routine X 2) w Reflex to ID Panel     Status: None (Preliminary result)   Collection Time: 09/16/18 10:46 AM  Result Value Ref Range Status   Specimen Description   Final    BLOOD BLOOD LEFT FOREARM Performed at [facility], [address] [place], [place] [address]    Special Requests   Final    BOTTLES DRAWN AEROBIC AND ANAEROBIC Blood Culture results may not be optimal due to an excessive volume of blood  received in culture bottles Performed at [facility], [address] [place], [place] [address]    Culture   Final    NO GROWTH < 24 HOURS Performed at [facility], [address] [place], [place] [address]    Report Status PENDING  Incomplete  Culture, blood (Routine X 2) w Reflex to ID Panel     Status: None (Preliminary result)   Collection Time: 09/16/18 12:30 PM  Result Value Ref Range Status   Specimen Description   Final    BLOOD RIGHT HAND Performed at [facility], [address] [place], [place] [address]    Special Requests   Final    BOTTLES DRAWN AEROBIC AND ANAEROBIC Blood Culture adequate volume Performed at [facility], [address] [place], [place] [address]    Culture   Final    NO GROWTH < 24 HOURS Performed at [facility], [address] [place], [place] [address]    Report Status PENDING  Incomplete  Radiology Studies: Dg Chest 2 View  Result Date: 09/16/2018 CLINICAL DATA:  Shortness of breath. EXAM: CHEST - 2 VIEW COMPARISON:  PA and lateral chest 04/20/2018. FINDINGS: The lungs are clear. Heart size is normal. No pneumothorax or pleural fluid. No acute or focal bony abnormality. The patient distal right forearm and hand over the right chest as he is unable to move his right arm. IMPRESSION: No acute disease. Electronically Signed   By: Drusilla Kanner M.D.   On: 09/16/2018 10:54   Dg Abd 1 View  Result Date: 09/17/2018 CLINICAL DATA:  Left flank pain. Chronic sacrococcygeal pain. 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 Few small nonobstructing stones in the right kidney, difficult to see clearly because of overlying fecal matter. No evidence of passing stone. Phleboliths in the pelvis. Large amount of fecal matter throughout the colon. No acute bone finding. IMPRESSION: Few nonobstructing stones in the right  kidney, difficult to see because of overlying fecal matter. Chronic suprapubic bladder catheter. Large amount of fecal matter throughout the colon. Electronically Signed   By: Paulina Fusi M.D.   On: 09/17/2018 08:14        Scheduled Meds: . ARIPiprazole  5 mg Oral Daily  . baclofen  40 mg Oral TID  . buPROPion  300 mg Oral Daily  . enoxaparin (LOVENOX) injection  40 mg Subcutaneous Q24H  . levothyroxine  125 mcg Oral Q0600  . pantoprazole  40 mg Oral Daily  . tiZANidine  4 mg Oral TID  . topiramate  50 mg Oral QHS   Continuous Infusions: . levofloxacin (LEVAQUIN) IV       LOS: 1 day    Time spent: 35    Delaine Lame, MD [facility]  If 7PM-7AM, please contact night-coverage [email or web address] Password [facility] 09/17/2018, 10:31 AM

## 2018-09-17 NOTE — Progress Notes (Signed)
Initial Nutrition Assessment  INTERVENTION:   Provide Ensure Enlive po BID, each supplement provides 350 kcal and 20 grams of protein  NUTRITION DIAGNOSIS:   Increased nutrient needs related to wound healing as evidenced by estimated needs.  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  Patient in room with tech. Pt reports having a good appetite and eating 3 meals a day typically. Yesterday states he ate only 2 meals as there was confusion with his meals. This morning he consumed 100% of his breakfast. Pt states he drinks Ensures at home. RD to order here.   Per weight history, pt has lost 15 lb since 4/29 (9% wt loss x 8.5 months, insignificant for time frame).   Labs reviewed. Medications reviewed.  NUTRITION - FOCUSED PHYSICAL EXAM:  Depletions noted. Due to pt's quadriplegia and MS, it is difficult to determine whether fat or muscle loss is related to nutrition status vs limited mobility.  Diet Order:   Diet Order            Diet regular Room service appropriate? Yes; Fluid consistency: Thin  Diet effective now              EDUCATION NEEDS:   No education needs have been identified at this time  Skin:  Skin Assessment: Skin Integrity Issues: Skin Integrity Issues:: Stage II Stage II: buttocks  Last BM:  PTA  Height:   Ht Readings from Last 1 Encounters:  09/16/18 6' (1.829 m)    Weight:   Wt Readings from Last 1 Encounters:  09/17/18 66 kg    Ideal Body Weight:  72.8 kg -adjusted for quadriplegia  BMI:  Body mass index is 19.73  kg/m.  Estimated Nutritional Needs:   Kcal:  1700-1900  Protein:  70-80g  Fluid:  1.9L/day   Tilda Franco, MS, RD, LDN [facility] Inpatient Clinical Dietitian Pager: [phone] After Hours Pager: [phone]

## 2018-09-18 LAB — CBC
HCT: 44.6 % (ref 39.0–52.0)
Hemoglobin: 14.4 g/dL (ref 13.0–17.0)
MCH: 28.8 pg (ref 26.0–34.0)
MCHC: 32.3 g/dL (ref 30.0–36.0)
MCV: 89.2 fL (ref 80.0–100.0)
Platelets: 270 10*3/uL (ref 150–400)
RBC: 5 MIL/uL (ref 4.22–5.81)
RDW: 13.3 % (ref 11.5–15.5)
WBC: 9.3 10*3/uL (ref 4.0–10.5)
nRBC: 0 % (ref 0.0–0.2)

## 2018-09-18 LAB — BASIC METABOLIC PANEL
Anion gap: 10 (ref 5–15)
BUN: 15 mg/dL (ref 6–20)
CO2: 24 mmol/L (ref 22–32)
Calcium: 9.3 mg/dL (ref 8.9–10.3)
Chloride: 106 mmol/L (ref 98–111)
Creatinine, Ser: 0.49 mg/dL — ABNORMAL LOW (ref 0.61–1.24)
GFR calc Af Amer: >60 mL/min (ref >60)
GFR calc non Af Amer: >60 mL/min (ref >60)
Glucose, Bld: 110 mg/dL — ABNORMAL HIGH (ref 70–99)
Potassium: 3.8 mmol/L (ref 3.5–5.1)
Sodium: 140 mmol/L (ref 135–145)

## 2018-09-18 MED ORDER — LEVOFLOXACIN 500 MG PO TABS: 500.0000 mg | ORAL_TABLET | Freq: Every day | ORAL | 0 refills | Status: AC

## 2018-09-18 NOTE — Care Management Important Message (Signed)
Important Message  Patient Details  Name: Samuel [name] MRN: [id number] Date of Birth: [birth year]   Medicare Important Message Given:  Yes. CMA tablet currently not working to accept patient's signature. CMA explained and gave patient Medicare rights.     Mennie Spiller 09/18/2018, 8:57 AM

## 2018-09-18 NOTE — Discharge Summary (Signed)
Physician Discharge Summary  CHAKA JEFFERYS ZOX:[id number] DOB: 08-Apr-1961 DOA: 09/16/2018  PCP: Barbie Banner, MD  Admit date: 09/16/2018 Discharge date: 09/18/2018  Admitted From: Home Disposition:  Home  Recommendations for Outpatient Follow-up:  1. Follow up with PCP in 1-2 weeks 2. Please obtain BMP/CBC in one week   Home Health: Yes Equipment/Devices: none  Discharge Condition: stable CODE STATUS: FULL Diet recommendation: regular  Brief/Interim Summary:  #) Acute metabolic encephalopathy due to UTI secondary to neurogenic bladder/BPH: Patient's mental status returned to baseline with gentle IV fluids and empiric IV antibiotics with leave levofloxacin.  Patient is a history of enterococcus as well as Citrobacter.  Urine cultures grew out multiple species suggesting recollection.  Blood cultures were negative.  Patient was discharged home on oral levofloxacin.  #) Primary progressive MS: Patient was continued on home baclofen and diazepam.  #) Hypertension: Patient home antihypertensives were held but may be restarted on discharge.  #) Hypothyroidism: Patient was continued on home levothyroxine.  #) Pain/psych: Patient was continued on home trazodone, tramadol, topiramate, bupropion, aripiprazole.  Discharge Diagnoses:  Principal Problem:   Acute metabolic encephalopathy Active Problems:   Neurogenic bladder   Essential hypertension   Acute lower UTI   Hypokalemia    Discharge Instructions  Discharge Instructions    Call MD for:  difficulty breathing, headache or visual disturbances   Complete by:  As directed    Call MD for:  hives   Complete by:  As directed    Call MD for:  persistant dizziness or light-headedness   Complete by:  As directed    Call MD for:  persistant nausea and vomiting   Complete by:  As directed    Call MD for:  redness, tenderness, or signs of infection (pain, swelling, redness, odor or green/yellow discharge around incision site)    Complete by:  As directed    Call MD for:  severe uncontrolled pain   Complete by:  As directed    Call MD for:  temperature >100.4   Complete by:  As directed    Diet - low sodium heart healthy   Complete by:  As directed    Discharge instructions   Complete by:  As directed    Antibiotics as prescribed.  Please follow-up with your primary care doctor in 1 week.   Increase activity slowly   Complete by:  As directed      Allergies as of 09/18/2018      Reactions   Penicillins Other (See Comments)   > 20 yr ago, doesn't recall nature of reaction (didn't require hospitalization) Tolerates Ancef, Rocephin, and Cefepime per chart review Does not recall having taken amox/amp since initial rxn; none documented in Epic   Primaxin [imipenem] Other (See Comments)   Seizure       Medication List    TAKE these medications   acetic acid 0.25 % irrigation Irrigate with as directed daily. Instill 30ml and clamp tube for 30 minutes then drain.   ARIPiprazole 5 MG tablet Commonly known as:  ABILIFY Take 1 tablet (5 mg total) by mouth daily.   AZO-CRANBERRY PO Take 1 tablet by mouth 2 (two) times daily.   baclofen 20 MG tablet Commonly known as:  LIORESAL TAKE 2 TABLETS BY MOUTH THREE TIMES DAILY   buPROPion 150 MG 24 hr tablet Commonly known as:  WELLBUTRIN XL 2 TABLETS DAILY   clotrimazole-betamethasone cream Commonly known as:  LOTRISONE Apply 1 application topically 2 (two) times  daily.   diazepam 5 MG tablet Commonly known as:  VALIUM TAKE 1 TABLET BY MOUTH THREE TIMES DAILY What changed:    when to take this  reasons to take this   fosfomycin 3 g Pack Commonly known as:  MONUROL Take 3 g by mouth. Three times weekly   Gerhardt's butt cream Crea Apply 1 application topically 3 (three) times daily.   levofloxacin 500 MG tablet Commonly known as:  LEVAQUIN Take 1 tablet (500 mg total) by mouth daily for 10 days.   levothyroxine 125 MCG tablet Commonly known  as:  SYNTHROID, LEVOTHROID Take 125 mcg by mouth daily.   lisinopril-hydrochlorothiazide 10-12.5 MG tablet Commonly known as:  PRINZIDE,ZESTORETIC Take 0.5 tablets by mouth daily.   magnesium citrate Soln Take 0.5 Bottles by mouth daily as needed for moderate constipation or severe constipation.   omeprazole 20 MG capsule Commonly known as:  PRILOSEC Take 20 mg by mouth every morning.   polyethylene glycol packet Commonly known as:  MIRALAX / GLYCOLAX Take 17 g by mouth daily. What changed:    when to take this  reasons to take this   tamsulosin 0.4 MG Caps capsule Commonly known as:  FLOMAX Take 1 capsule (0.4 mg total) by mouth daily.   tiZANidine 4 MG tablet Commonly known as:  ZANAFLEX TAKE 2 TABLETS BY MOUTH THREE TIMES DAILY.  PLEASE CALL [phone] TO SCHEDULE AN APPOINTMENT What changed:  See the new instructions.   topiramate 25 MG tablet Commonly known as:  TOPAMAX Take one tablet at night for one week, then take 2 tablets at night   traMADol 50 MG tablet Commonly known as:  ULTRAM TAKE 1 TABLET BY MOUTH EVERY 6 HOURS AS NEEDED What changed:  reasons to take this   trazodone 300 MG tablet Commonly known as:  DESYREL TAKE 1 TABLET BY MOUTH ONCE DAILY AT BEDTIME   vitamin A & D ointment Apply 1 application topically as needed for dry skin.   Vitamin D (Ergocalciferol) 1.25 MG (50000 UT) Caps capsule Commonly known as:  DRISDOL Take 50,000 Units by mouth every 7 (seven) days. Friday only       Allergies  Allergen Reactions  . Penicillins Other (See Comments)    > 20 yr ago, doesn't recall nature of reaction (didn't require hospitalization) Tolerates Ancef, Rocephin, and Cefepime per chart review Does not recall having taken amox/amp since initial rxn; none documented in Epic   . Primaxin [Imipenem] Other (See Comments)    Seizure     Consultations:  None   Procedures/Studies: Dg Chest 2 View  Result Date: 09/16/2018 CLINICAL DATA:   Shortness of breath. EXAM: CHEST - 2 VIEW COMPARISON:  PA and lateral chest 04/20/2018. FINDINGS: The lungs are clear. Heart size is normal. No pneumothorax or pleural fluid. No acute or focal bony abnormality. The patient distal right forearm and hand over the right chest as he is unable to move his right arm. IMPRESSION: No acute disease. Electronically Signed   By: Drusilla Kanner M.D.   On: 09/16/2018 10:54   Dg Abd 1 View  Result Date: 09/17/2018 CLINICAL DATA:  Left flank pain. Chronic sacrococcygeal pain. 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 Few small nonobstructing stones in the right kidney, difficult to see clearly because of overlying fecal matter. No evidence of passing stone. Phleboliths in the pelvis. Large amount of fecal matter throughout the colon. No acute bone finding. IMPRESSION: Few nonobstructing stones  in the right kidney, difficult to see because of overlying fecal matter. Chronic suprapubic bladder catheter. Large amount of fecal matter throughout the colon. Electronically Signed   By: Paulina FusiMark  Shogry M.D.   On: 09/17/2018 08:14      Subjective:   Discharge Exam: Vitals:   09/17/18 2100 09/18/18 0508  BP: 131/82 126/82  Pulse: 97 94  Resp: 16 16  Temp: 98.4 F (36.9 C) 98.3 F (36.8 C)  SpO2: 96% 97%   Vitals:   09/17/18 0504 09/17/18 1232 09/17/18 2100 09/18/18 0508  BP: (!) 149/104 (!) 131/95 131/82 126/82  Pulse: 90 (!) 102 97 94  Resp: 18 16 16 16   Temp: 98.4 F (36.9 C) 99.1 F (37.3 C) 98.4 F (36.9 C) 98.3 F (36.8 C)  TempSrc: Oral Oral Oral Oral  SpO2: 96% 97% 96% 97%  Weight: 66 kg     Height:       General exam: Appears calm and comfortable  Respiratory system: Clear to auscultation. Respiratory effort normal. Cardiovascular system: Distant heart sounds, regular rate and rhythm, no murmurs Gastrointestinal system: Soft, nondistended, no rebound or guarding, plus bowel  sounds. Central nervous system: Alert and oriented to person, place, time and situation.  Noted to have bilateral upper extremity contractures and lower extremity weakness and contractures Extremities: No lower extremity edema Skin: No rashes over visible skin Psychiatry: Judgement and insight appear normal. Mood & affect appropriate.    The results of significant diagnostics from this hospitalization (including imaging, microbiology, ancillary and laboratory) are listed below for reference.     Microbiology: Recent Results (from the past 240 hour(s))  Urine Culture     Status: None   Collection Time: 09/16/18 10:46 AM  Result Value Ref Range Status   Specimen Description   Final    URINE, CLEAN CATCH Performed at [facility], [address] [place], [place] [address]    Special Requests   Final    NONE Performed at [facility], [address] [place], [place] [address]    Culture   Final    Multiple bacterial morphotypes present, none predominant. Suggest appropriate recollection if clinically indicated.   Report Status 09/17/2018 FINAL  Final  Culture, blood (Routine X 2) w Reflex to ID Panel     Status: None (Preliminary result)   Collection Time: 09/16/18 10:46 AM  Result Value Ref Range Status   Specimen Description   Final    BLOOD BLOOD LEFT FOREARM Performed at [facility], [address] [place], [place] [address]    Special Requests   Final    BOTTLES DRAWN AEROBIC AND ANAEROBIC Blood Culture results may not be optimal due to an excessive volume of blood received in culture bottles Performed at [facility], [address] [place], [place] [address]    Culture   Final    NO GROWTH 2 DAYS Performed at [facility], [address] [place], [place] [address]    Report Status PENDING  Incomplete  Culture, blood (Routine X 2) w Reflex to ID Panel     Status: None (Preliminary  result)   Collection Time: 09/16/18 12:30 PM  Result Value Ref Range Status   Specimen Description   Final    BLOOD RIGHT HAND Performed at [facility], [address] [place], [place] [address]    Special Requests   Final    BOTTLES DRAWN AEROBIC AND ANAEROBIC Blood Culture  adequate volume Performed at [facility], [address] [place], [place] [address]    Culture   Final    NO GROWTH 2 DAYS Performed at [facility], [address], [place], [place] [address]    Report Status PENDING  Incomplete     Labs: BNP (last 3 results) No results for input(s): BNP in the last 8760 hours. Basic Metabolic Panel: Recent Labs  Lab 09/16/18 1200 09/17/18 0549 09/18/18 0325  NA 138 137 140  K 3.0* 3.7 3.8  CL 98 105 106  CO2 27 22 24   GLUCOSE 81 86 110*  BUN 18 10 15   CREATININE 0.79 0.58* 0.49*  CALCIUM 10.1 9.2 9.3  MG 2.2  --   --    Liver Function Tests: Recent Labs  Lab 09/16/18 1200  AST 25  ALT 41  ALKPHOS 147*  BILITOT 0.7  PROT 9.0*  ALBUMIN 5.4*   No results for input(s): LIPASE, AMYLASE in the last 168 hours. Recent Labs  Lab 09/17/18 0549  AMMONIA 26   CBC: Recent Labs  Lab 09/16/18 1200 09/17/18 0549 09/18/18 0325  WBC 8.9 10.1 9.3  NEUTROABS 6.1  --   --   HGB 16.9 14.3 14.4  HCT 53.5* 45.2 44.6  MCV 90.2 94.2 89.2  PLT 257 184 270   Cardiac Enzymes: No results for input(s): CKTOTAL, CKMB, CKMBINDEX, TROPONINI in the last 168 hours. BNP: Invalid input(s): POCBNP CBG: No results for input(s): GLUCAP in the last 168 hours. D-Dimer No results for input(s): DDIMER in the last 72 hours. Hgb A1c No results for input(s): HGBA1C in the last 72 hours. Lipid Profile No results for input(s): CHOL, HDL, LDLCALC, TRIG, CHOLHDL, LDLDIRECT in the last 72 hours. Thyroid function studies Recent Labs    09/17/18 0549  TSH 7.893*   Anemia work up No results for input(s): VITAMINB12, FOLATE, FERRITIN,  TIBC, IRON, RETICCTPCT in the last 72 hours. Urinalysis    Component Value Date/Time   COLORURINE YELLOW 09/16/2018 1046   APPEARANCEUR CLOUDY (A) 09/16/2018 1046   LABSPEC 1.003 (L) 09/16/2018 1046   PHURINE 7.0 09/16/2018 1046   GLUCOSEU NEGATIVE 09/16/2018 1046   HGBUR MODERATE (A) 09/16/2018 1046   BILIRUBINUR NEGATIVE 09/16/2018 1046   KETONESUR NEGATIVE 09/16/2018 1046   PROTEINUR NEGATIVE 09/16/2018 1046   UROBILINOGEN 0.2 06/02/2015 0459   NITRITE NEGATIVE 09/16/2018 1046   LEUKOCYTESUR LARGE (A) 09/16/2018 1046   Sepsis Labs Invalid input(s): PROCALCITONIN,  WBC,  LACTICIDVEN Microbiology Recent Results (from the past 240 hour(s))  Urine Culture     Status: None   Collection Time: 09/16/18 10:46 AM  Result Value Ref Range Status   Specimen Description   Final    URINE, CLEAN CATCH Performed at [facility], [address] [place], [place] [address]    Special Requests   Final    NONE Performed at [facility], [address], [place], [place] [address]    Culture   Final    Multiple bacterial morphotypes present, none predominant. Suggest appropriate recollection if clinically indicated.   Report Status 09/17/2018 FINAL  Final  Culture, blood (Routine X 2) w Reflex to ID Panel     Status: None (Preliminary result)   Collection Time: 09/16/18 10:46 AM  Result Value Ref Range Status   Specimen Description   Final    BLOOD BLOOD LEFT FOREARM Performed at [facility], [address], [place], [place] [address]  Special Requests   Final    BOTTLES DRAWN AEROBIC AND ANAEROBIC Blood Culture results may not be optimal due to an excessive volume of blood received in culture bottles Performed at [facility], [address] [place], [place] [address]    Culture   Final    NO GROWTH 2 DAYS Performed at [facility], [address] [place], [place] [address]    Report Status PENDING   Incomplete  Culture, blood (Routine X 2) w Reflex to ID Panel     Status: None (Preliminary result)   Collection Time: 09/16/18 12:30 PM  Result Value Ref Range Status   Specimen Description   Final    BLOOD RIGHT HAND Performed at [facility], [address] [place], [place] [address]    Special Requests   Final    BOTTLES DRAWN AEROBIC AND ANAEROBIC Blood Culture adequate volume Performed at [facility], [address] [place], [place] [address]    Culture   Final    NO GROWTH 2 DAYS Performed at [facility], [address] [place], [place] [address]    Report Status PENDING  Incomplete     Time coordinating discharge: 35  SIGNED:   Delaine LameShrey C Dalya Maselli, MD  [facility] 09/18/2018, 10:37 AM  If 7PM-7AM, please contact night-coverage [email or web address] Password [email or web address]

## 2018-09-18 NOTE — Progress Notes (Signed)
Patient discharged home via [facility] in stable condition. Discharge orders and scripts given to patient's sister, [name]. No immediate questions or concerns at this time.

## 2018-09-18 NOTE — Discharge Instructions (Signed)
Antibiotic Medicine, Adult ° °Antibiotic medicines are used to treat infections caused by bacteria, such as strep throat and urinary tract infection (UTI). Antibiotic medicines will not work for viral illnesses, such as colds or the flu (influenza). They work by killing the bacteria that is making you sick. Antibiotics can also have serious side effects. It is important that you take antibiotic medicines safely and only when needed. °When do I need to take antibiotics? °Antibiotics are medicines that treat bacterial infections. You may need antibiotics for: °· UTI. °· Strep throat. °· Meningitis. This infection affects the spinal cord and brain. °· Bacterial sinusitis. °· Serious lung infection. °You may start antibiotics while your health care provider waits for test results to come back. Common tests may include throat, urine, blood, or mucus culture. Your health care provider may change or stop the antibiotic depending on your test results. °When are antibiotics not needed? °You do not need antibiotics for most common illnesses. These illnesses may be caused by a virus, not a bacteria. You do not need antibiotics for: °· The common cold. °· Influenza. °· Sore throat. °· Discolored mucus. °· Bronchitis. °Antibiotics are not always needed for all bacterial infections. Many of these infections clear up without antibiotic treatment. Do not ask for or take antibiotics when they are not necessary. °How long should I take the antibiotic? °You must take the entire prescription. Continue to take your antibiotic for as long as told by your health care provider. Do not stop taking it even if you start to feel better. If you stop taking it too soon: °· You may start to feel sick again. °· Your infection may become harder to treat. °· Complications may develop. °Each course of antibiotics needs a different amount of time to work. Some antibiotic courses last only a few days. Some last about a week to 10 days. In some cases,  you may need to take antibiotics for a few weeks to completely treat the infection. °What if I miss a dose? °Try not to miss any doses of medicine. If you miss a dose, call your health care provider or pharmacist for advice. Sometimes it is okay to take the missed dose as soon as possible. °What are the risks of taking antibiotics? °Most antibiotics can cause an infection called Clostridioides difficile (C. difficile or C. diff), which causes severe diarrhea. This infection happens when the antibiotics kill the healthy bacteria in your intestines. This allows C. diff to grow. The infection needs to be treated right away. Let your health care provider know if: °· You have diarrhea while taking an antibiotic. °· You have diarrhea after you stop taking an antibiotic. C. diff infection can start weeks after stopping the antibiotic. °Taking an antibiotic also puts you at risk for getting a bacteria that does not respond to medicine (antibiotic-resistant infection) in the future. Antibiotics can cause bacteria to change so that if the antibiotic is taken again, the medicine is not able to kill the bacteria. These infections can be more serious and, in some cases, life-threatening. °Do antibiotics affect birth control? °Birth control pills may not work while you are on antibiotics. If you are taking birth control pills, continue taking them as usual and use a second form of birth control, such as a condom, to avoid unwanted pregnancy. Continue using the second form of birth control until your health care provider says you can stop. °What else should I know about taking antibiotics? °It is important for you to   take antibiotics exactly as told. Make sure that you: °· Take the entire course of antibiotic that was prescribed. Do not stop taking your antibiotics even if your symptoms improve. °· Take the correct amount of medicine each day. °· Ask your health care provider: °? How long to wait in between doses. °? If the  antibiotic should be taken with food. °? If there are any foods, drinks, or medicines that you should avoid while taking the antibiotics. °? If there are any side effects you should be aware of. °· Only use the antibiotics prescribed for you by your health care provider. Do not use antibiotics prescribed for someone else. °· Drink a large glass of water along with the antibiotics. °· Ask the pharmacist for a syringe, cup, or spoon that properly measures the antibiotics. °· Throw away any leftover medicine. °Contact a health care provider if: °· Your symptoms get worse. °· You have new joint pain or muscle aches that begin after starting the antibiotic. °When should I seek immediate medical care? °· You have signs of a serious allergic reaction to antibiotics. If you have signs of a severe allergic reaction, stop taking the antibiotic right away. Signs may include: °? Hives, which are raised, itchy, red bumps on the skin. °? Skin rash. °? Trouble breathing. °? A wheezing sound when you breathe. °? Swelling anywhere on your body. °? Feeling dizzy. °? Vomiting. °· Your urine turns dark or becomes blood-colored. °· Your skin turns yellow. °· You bruise or bleed easily. °· You have severe diarrhea and abdominal cramps. °· You have a severe headache. °Summary °· Antibiotic medicines are used to treat infections caused by bacteria, such as strep throat and UTIs. It is important that you take antibiotic medicines only when needed. °· Your health care provider may change or stop the antibiotic depending on your test results. °· Most antibiotics can cause an infection called Clostridioides difficile (C. difficile or C. diff), which causes severe diarrhea. Let your health care provider know if you develop diarrhea while taking an antibiotic. °· Take the entire course of antibiotic that was prescribed. °This information is not intended to replace advice given to you by your health care provider. Make sure you discuss any  questions you have with your health care provider. °Document Released: 05/01/2004 Document Revised: 02/17/2018 Document Reviewed: 08/20/2016 °Elsevier Interactive Patient Education © 2019 [employer] ° °

## 2018-09-19 ENCOUNTER — Other Ambulatory Visit: Payer: Self-pay | Admitting: Neurology

## 2018-09-21 LAB — CULTURE, BLOOD (ROUTINE X 2)
Culture: NO GROWTH
Culture: NO GROWTH
Special Requests: ADEQUATE

## 2018-09-24 ENCOUNTER — Other Ambulatory Visit: Payer: Self-pay | Admitting: Neurology

## 2018-09-30 ENCOUNTER — Telehealth: Payer: Self-pay | Admitting: Neurology

## 2018-09-30 ENCOUNTER — Other Ambulatory Visit: Payer: Self-pay | Admitting: Neurology

## 2018-09-30 ENCOUNTER — Other Ambulatory Visit: Payer: Medicare Other | Admitting: Nurse Practitioner

## 2018-09-30 DIAGNOSIS — F339 Major depressive disorder, recurrent, unspecified: Secondary | ICD-10-CM

## 2018-09-30 DIAGNOSIS — G839 Paralytic syndrome, unspecified: Secondary | ICD-10-CM

## 2018-09-30 DIAGNOSIS — B356 Tinea cruris: Secondary | ICD-10-CM

## 2018-09-30 DIAGNOSIS — M629 Disorder of muscle, unspecified: Secondary | ICD-10-CM

## 2018-09-30 DIAGNOSIS — G8929 Other chronic pain: Secondary | ICD-10-CM

## 2018-09-30 DIAGNOSIS — K5900 Constipation, unspecified: Secondary | ICD-10-CM

## 2018-09-30 DIAGNOSIS — K592 Neurogenic bowel, not elsewhere classified: Secondary | ICD-10-CM

## 2018-09-30 DIAGNOSIS — Z515 Encounter for palliative care: Secondary | ICD-10-CM

## 2018-09-30 MED ORDER — MIRTAZAPINE 30 MG PO TABS: 30.0000 mg | ORAL_TABLET | Freq: Every day | ORAL | 2 refills | Status: DC

## 2018-09-30 NOTE — Addendum Note (Signed)
Addended by: York Spaniel on: 09/30/2018 01:00 PM   Modules accepted: Orders

## 2018-09-30 NOTE — Telephone Encounter (Signed)
I called the patient, spoke with the mother.  The patient is not getting benefit from the trazodone for sleep at night at the 300 mg dose.  I will try 30 mg dose of the Remeron to see if this is helpful.  He will stop the trazodone.

## 2018-09-30 NOTE — Progress Notes (Signed)
PALLIATIVE CARE CONSULT VISIT   PATIENT NAME: Samuel [name] DOB: 07-05-1961 MRN: [id number]  PRIMARY CARE PROVIDER:   Barbie Banner, MD  REFERRING PROVIDER:  Barbie Banner, MD [address] [place] [address]  RESPONSIBLE PARTY: Samuel [name] (mother) [phone]  HISTORY OF PRESENT ILLNESS:  Samuel [name] is a 58 y.o. year old male with multiple medical problems including multiple sclerosis with spastic quadriparesis, chronic pain,neurogenic bladder, chronic foley, depression, hypothyroidism. Palliative Care was asked to help with symptom management and to address goals of care.   CC/Interim history: Fungal rash of buttocks, scrotum, inner thighs etc. Greatly improved with treatment; patient reports no pain; appetite unchanged; weight stable.  ASSESSMENT/PLAN/RECOMMENDATIONS -Multiple Sclerosis with spastic quadripareis -followed by [facility]  -Receiving Ocrevus -baclofen 20mg  TID  -Neurogenic bladder/urinary retention/chronic suprapubic foley -bladder calculi -patient with intermittent bladder spasms usually relived by changing foley -cranberry tablet 1 BID  -Depression (improved) -chronic pain -Neurogenic bowel -chronic constipation -followed by psychiatry -recently started on abilify -wellbutrin 150mg  BID -valium 5mg  TID  -hypothyroidism -synthroid daily  -ACP -full code   I spent 30 minutes providing this consultation, from 11:00 to 11:30. More than 50% of the time in this consultation was spent coordinating communication.    CODE STATUS: full code  PPS: 30% HOSPICE ELIGIBILITY/DIAGNOSIS: TBD  PAST MEDICAL HISTORY:  Past Medical History:  Diagnosis Date  . Anxiety   . Bladder calculi   . Chronic back pain   . Chronic fatigue   . Depression   . Foley catheter in place   . Gait disorder   . GERD (gastroesophageal reflux disease)   . History of kidney stones   . Hyperlipidemia   . Hypertension   . Hypothyroidism   .  Incontinence of urine   . Multiple sclerosis (HCC) NEUROLOGIST-- DR Samuel [name]   dx 1993, JC virus negative 2/14---  CURRENTLY RECEIVING TYSABRI IV TREATMENT  . Neurogenic bladder   . Neurogenic bowel    intermittant constipation and diarrhea  . Pneumonia    dx  03-03-2014--  admitted for iv and oral antibiotics  . 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 Urinary retention   . Vitamin D deficiency   . Yeast infection    genital area    SOCIAL HX:  Social History   Tobacco Use  . Smoking status: Former Smoker    Packs/day: 1.50    Years: 8.00    Pack years: 12.00    Types: Cigarettes    Last attempt to quit: 06/17/1985    Years since quitting: 33.3  . Smokeless tobacco: Never Used  Substance Use Topics  . Alcohol use: No    Alcohol/week: 0.0 standard drinks    ALLERGIES:  Allergies  Allergen Reactions  . Penicillins Other (See Comments)    > 20 yr ago, doesn't recall nature of reaction (didn't require hospitalization) Tolerates Ancef, Rocephin, and Cefepime per chart review Does not recall having taken amox/amp since initial rxn; none documented in Epic   . Primaxin [Imipenem] Other (See Comments)    Seizure      PERTINENT MEDICATIONS:  Outpatient Encounter Medications as of 09/30/2018  Medication Sig  . acetic acid 0.25 % irrigation Irrigate with as directed daily. Instill 51ml and clamp tube for 30 minutes then drain.  . ARIPiprazole (ABILIFY) 5 MG tablet TAKE 1 TABLET BY MOUTH ONCE DAILY  . AZO-CRANBERRY PO Take 1 tablet by mouth 2 (two) times daily.  . baclofen (LIORESAL) 20  MG tablet TAKE 2 TABLETS BY MOUTH THREE TIMES DAILY  . buPROPion (WELLBUTRIN XL) 150 MG 24 hr tablet 2 TABLETS DAILY  . clotrimazole-betamethasone (LOTRISONE) cream Apply 1 application topically 2 (two) times daily.  . diazepam (VALIUM) 5 MG tablet TAKE 1 TABLET BY MOUTH THREE TIMES DAILY (Patient taking differently: Take 5 mg by mouth every 12 (twelve) hours as needed for anxiety or muscle  spasms. )  . fosfomycin (MONUROL) 3 g PACK Take 3 g by mouth. Three times weekly  . Hydrocortisone (GERHARDT'S BUTT CREAM) CREA Apply 1 application topically 3 (three) times daily. (Patient not taking: Reported on 09/16/2018)  . levothyroxine (SYNTHROID, LEVOTHROID) 125 MCG tablet Take 125 mcg by mouth daily.  Marland Kitchen lisinopril-hydrochlorothiazide (PRINZIDE,ZESTORETIC) 10-12.5 MG tablet Take 0.5 tablets by mouth daily.  . magnesium citrate SOLN Take 0.5 Bottles by mouth daily as needed for moderate constipation or severe constipation.  Marland Kitchen omeprazole (PRILOSEC) 20 MG capsule Take 20 mg by mouth every morning.   . polyethylene glycol (MIRALAX / GLYCOLAX) packet Take 17 g by mouth daily. (Patient taking differently: Take 17 g by mouth as needed. )  . tamsulosin (FLOMAX) 0.4 MG CAPS capsule Take 1 capsule (0.4 mg total) by mouth daily. (Patient not taking: Reported on 09/16/2018)  . tiZANidine (ZANAFLEX) 4 MG tablet TAKE 2 TABLETS BY MOUTH THREE TIMES DAILY.  PLEASE CALL [phone] TO SCHEDULE AN APPOINTMENT (Patient taking differently: Take 4 mg by mouth 3 (three) times daily. )  . topiramate (TOPAMAX) 25 MG tablet TAKE 1 TABLET BY MOUTH AT NIGHT FOR 1 WEEK, THEN TAKE 2 TABLETS AT NIGHT  . traMADol (ULTRAM) 50 MG tablet TAKE 1 TABLET BY MOUTH EVERY 6 HOURS AS NEEDED (Patient taking differently: Take 50 mg by mouth every 6 (six) hours as needed for moderate pain. )  . trazodone (DESYREL) 300 MG tablet TAKE 1 TABLET BY MOUTH ONCE DAILY AT BEDTIME  . Vitamin D, Ergocalciferol, (DRISDOL) 50000 UNITS CAPS Take 50,000 Units by mouth every 7 (seven) days. Friday only  . Vitamins A & D (VITAMIN A & D) ointment Apply 1 application topically as needed for dry skin.   Facility-Administered Encounter Medications as of 09/30/2018  Medication  . botulinum toxin Type A (BOTOX) injection 300 Units    PHYSICAL EXAM:   General: NAD,  thin Cardiovascular: regular rate and rhythm Pulmonary: clear ant fields Abdomen: soft,  nontender, + bowel sounds GU: no suprapubic tenderness Extremities: no edema, bilateral foot drop; flexion contractures of upper ext Skin: slightly pink coloration of previous rash of buttocks; o/w no other lesions of exposed skin noted Neurological: spastic quadraparesis  Stephanie G [place], NP

## 2018-09-30 NOTE — Telephone Encounter (Signed)
Pt sister(on DPR) has called per request of pt to have [name] be made aware that the trazodone (DESYREL) 300 MG tablet is no longer helping him sleep.  Pt would like to try something else.  Please call sister

## 2018-10-07 ENCOUNTER — Other Ambulatory Visit: Payer: Self-pay | Admitting: Neurology

## 2018-10-24 ENCOUNTER — Other Ambulatory Visit: Payer: Self-pay | Admitting: Neurology

## 2018-10-28 ENCOUNTER — Other Ambulatory Visit: Payer: Self-pay | Admitting: Neurology

## 2018-11-09 ENCOUNTER — Telehealth: Payer: Self-pay

## 2018-11-09 NOTE — Telephone Encounter (Signed)
Received a fax from [place] stating the pt needs to complete the Ocrevus start form in order to apply for financial assistance for the drug. I requested the pt to call back so I could discuss this form and determine the best way to get the form to him.

## 2018-11-13 NOTE — Telephone Encounter (Signed)
Leanne nurse with [facility] spoke with the pt's sister in regards to ocrevus forms. She requested the start form needed to be completed can be mailed and will be mailed back to [facility] once completed. Letter placed in out going mail at [employer].

## 2018-11-30 ENCOUNTER — Telehealth: Payer: Self-pay

## 2018-11-30 NOTE — Telephone Encounter (Signed)
Patient called in on 11/27/18 and spoke with Rene Kocher, RN with [facility]. Pt expressed concerns in regards to receiving his Ocrevus with the the covid 19 prevalent.  Pt is requesting Dr. Anne Hahn review this message and provide his recommendations.

## 2018-11-30 NOTE — Telephone Encounter (Signed)
I called the patient, left a message.  Okay to delay Ocrevus infusion by 6 weeks.

## 2018-12-04 IMAGING — DX DG CHEST 1V PORT
1 series · 1 of 1 positions shown · non-contrast
Comparison: Chest x-ray of December 29, 2017

CLINICAL DATA: Respiratory failure, spastic hemiplegia, multiple
scleroses, possible aspiration pneumonia. Urinary tract stones.

EXAM:
PORTABLE CHEST 1 VIEW

[chest ap]
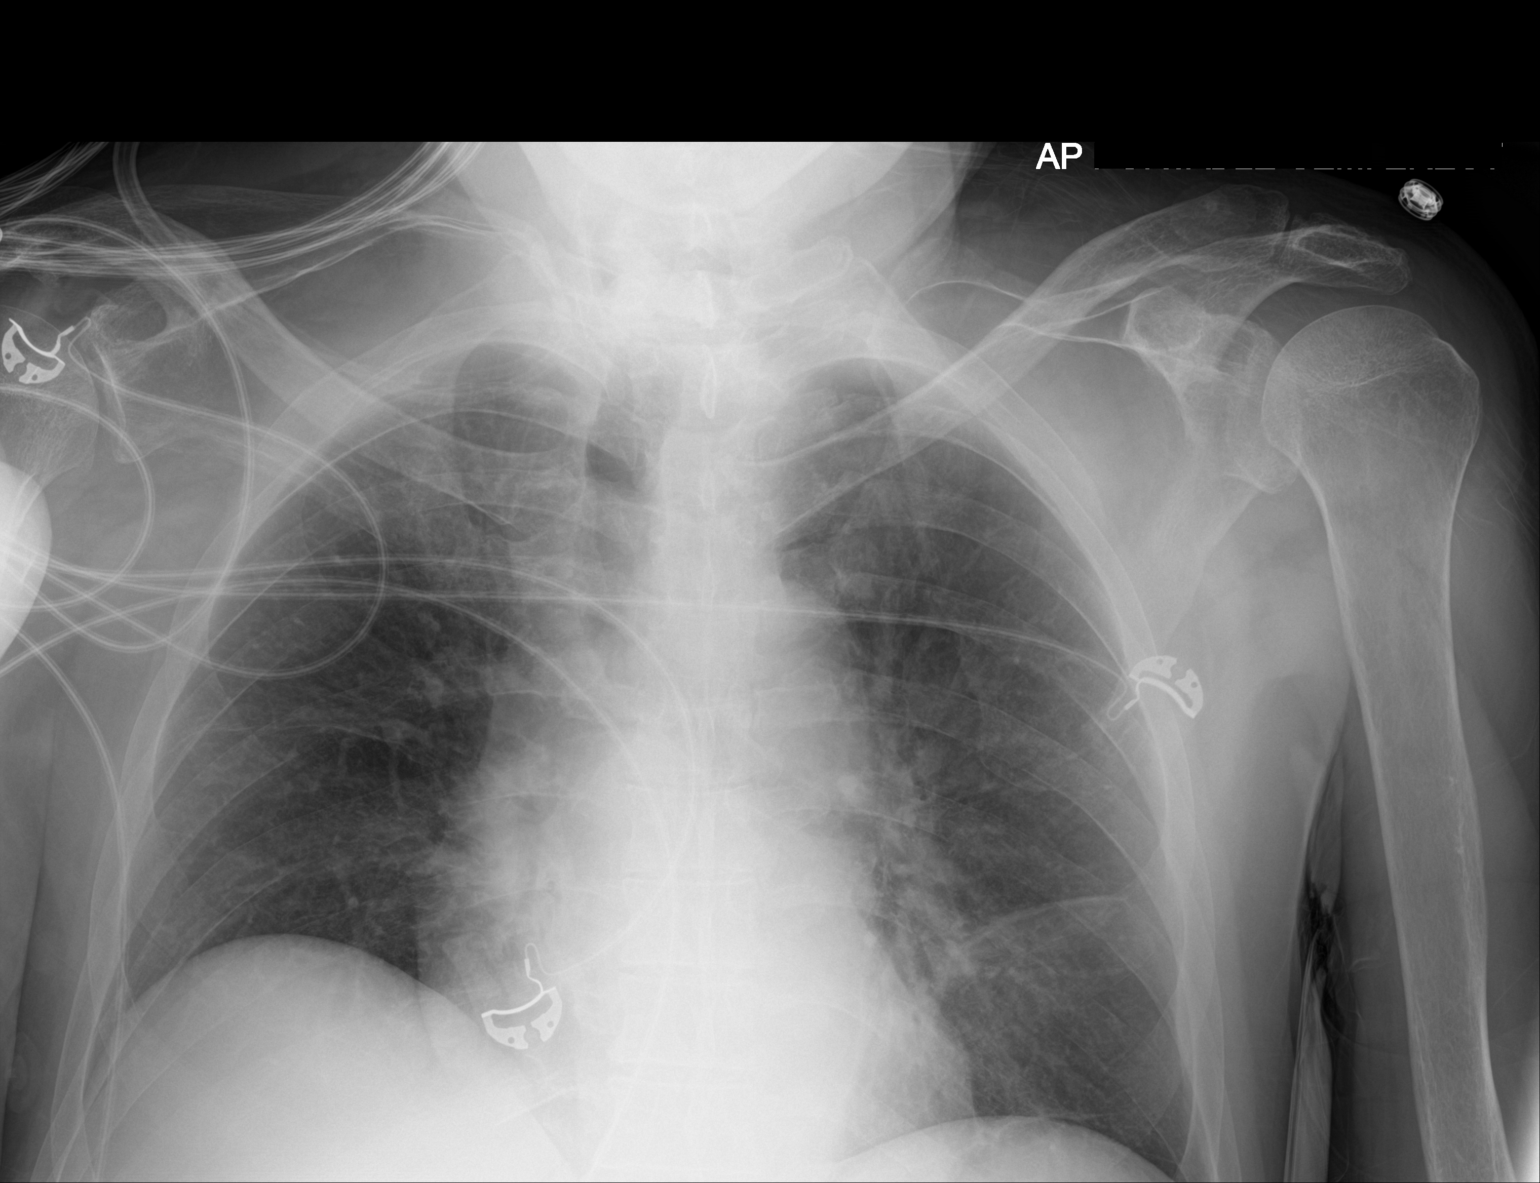

[1 of 1 positions shown; findings below may reference images not displayed]

FINDINGS: The lungs are reasonably well inflated. Density at the left lung
base is less conspicuous today suggesting that it may have been
indicated transient atelectasis. There is no discrete infiltrate.
The heart is top-normal in size. The pulmonary vascularity is not
engorged.
IMPRESSION: Improved appearance of the left lung base consistent with resolving
atelectasis. No acute pneumonia nor CHF.

## 2018-12-15 ENCOUNTER — Ambulatory Visit: Payer: Medicare Other | Admitting: Neurology

## 2018-12-17 ENCOUNTER — Telehealth: Payer: Self-pay | Admitting: Neurology

## 2018-12-17 NOTE — Telephone Encounter (Signed)
Pt sister called for the infusion suite, call connected

## 2018-12-21 ENCOUNTER — Other Ambulatory Visit: Payer: Self-pay | Admitting: Neurology

## 2018-12-26 ENCOUNTER — Other Ambulatory Visit: Payer: Self-pay | Admitting: Neurology

## 2019-01-06 ENCOUNTER — Other Ambulatory Visit: Payer: Self-pay | Admitting: Neurology

## 2019-01-06 ENCOUNTER — Telehealth: Payer: Self-pay | Admitting: Internal Medicine

## 2019-01-06 NOTE — Telephone Encounter (Signed)
1pm:  TC and voice message left with my contact information, offering tele-health Palliative Care visit, in f/u from when last seen 09/30/2018. Holly Bodily NP-C [phone]

## 2019-01-06 NOTE — Telephone Encounter (Signed)
1:17pm: RTC from patient's sister and HCPOA Ronald Pippins (formerly Tania [phone].  [name] shared that patient is stable; anticipating infusion at office of neurologist May 13th. [name] requests that we call and check in about every 2 months or so, and that she in turn will feel free to reach out to [place] on a prn basis. I provided my contact information. Holly Bodily NP-C  [phone]

## 2019-01-13 ENCOUNTER — Other Ambulatory Visit: Payer: Self-pay | Admitting: Neurology

## 2019-01-13 DIAGNOSIS — Z5181 Encounter for therapeutic drug level monitoring: Secondary | ICD-10-CM

## 2019-01-13 NOTE — Addendum Note (Signed)
Addended by: Tamera Stands D on: 01/13/2019 01:50 PM   Modules accepted: Orders

## 2019-01-14 ENCOUNTER — Telehealth: Payer: Self-pay | Admitting: Internal Medicine

## 2019-01-14 NOTE — Telephone Encounter (Signed)
11:15am: Renewal for generic Lotrisone cream called into [employer]. Holly Bodily NP-C [phone]

## 2019-01-15 LAB — CBC WITH DIFFERENTIAL/PLATELET
Basophils Absolute: 0 10*3/uL (ref 0.0–0.2)
Basos: 0 %
EOS (ABSOLUTE): 0 10*3/uL (ref 0.0–0.4)
Eos: 0 %
Hematocrit: 50.5 % (ref 37.5–51.0)
Hemoglobin: 17 g/dL (ref 13.0–17.7)
Immature Grans (Abs): 0.1 10*3/uL (ref 0.0–0.1)
Immature Granulocytes: 1 %
Lymphocytes Absolute: 0.5 10*3/uL — ABNORMAL LOW (ref 0.7–3.1)
Lymphs: 4 %
MCH: 29.4 pg (ref 26.6–33.0)
MCHC: 33.7 g/dL (ref 31.5–35.7)
MCV: 87 fL (ref 79–97)
Monocytes Absolute: 0.1 10*3/uL (ref 0.1–0.9)
Monocytes: 1 %
Neutrophils Absolute: 12.3 10*3/uL — ABNORMAL HIGH (ref 1.4–7.0)
Neutrophils: 94 %
Platelets: 292 10*3/uL (ref 150–450)
RBC: 5.79 x10E6/uL (ref 4.14–5.80)
RDW: 12.5 % (ref 11.6–15.4)
WBC: 13.1 10*3/uL — ABNORMAL HIGH (ref 3.4–10.8)

## 2019-01-15 LAB — COMPREHENSIVE METABOLIC PANEL
ALT: 34 IU/L (ref 0–44)
AST: 26 IU/L (ref 0–40)
Albumin/Globulin Ratio: 2.3 — ABNORMAL HIGH (ref 1.2–2.2)
Albumin: 4.8 g/dL (ref 3.8–4.9)
Alkaline Phosphatase: 195 IU/L — ABNORMAL HIGH (ref 39–117)
BUN/Creatinine Ratio: 20 (ref 9–20)
BUN: 14 mg/dL (ref 6–24)
Bilirubin Total: 0.3 mg/dL (ref 0.0–1.2)
CO2: 18 mmol/L — ABNORMAL LOW (ref 20–29)
Calcium: 9.7 mg/dL (ref 8.7–10.2)
Chloride: 104 mmol/L (ref 96–106)
Creatinine, Ser: 0.7 mg/dL — ABNORMAL LOW (ref 0.76–1.27)
GFR calc Af Amer: 120 mL/min/{1.73_m2} (ref >59)
GFR calc non Af Amer: 104 mL/min/{1.73_m2} (ref >59)
Globulin, Total: 2.1 g/dL (ref 1.5–4.5)
Glucose: 169 mg/dL — ABNORMAL HIGH (ref 65–99)
Potassium: 4.1 mmol/L (ref 3.5–5.2)
Sodium: 141 mmol/L (ref 134–144)
Total Protein: 6.9 g/dL (ref 6.0–8.5)

## 2019-01-15 LAB — CD19 AND CD20, FLOW CYTOMETRY
% CD19: NEGATIVE %
% CD20: NEGATIVE %

## 2019-01-18 ENCOUNTER — Telehealth: Payer: Self-pay

## 2019-01-18 NOTE — Telephone Encounter (Signed)
-----   Message from York Spaniel, MD sent at 01/17/2019  4:38 PM EDT ----- Blood work is unremarkable with exception of a slightly elevated white blood count, mildly elevated alkaline phosphatase level, this has been seen previously.  CO2 level is slightly low.  Please contact our office if any sign of infection such as fever or foul-smelling urine.  Please call the patient. ----- Message ----- From: Nell Range Lab Results In Sent: 01/14/2019   7:37 AM EDT To: York Spaniel, MD

## 2019-01-18 NOTE — Telephone Encounter (Signed)
I contacted the pt and was able to advise upon lab results. He verbalized understanding and had no questions at this time.

## 2019-01-26 ENCOUNTER — Other Ambulatory Visit: Payer: Self-pay | Admitting: Neurology

## 2019-02-05 ENCOUNTER — Other Ambulatory Visit: Payer: Self-pay | Admitting: Neurology

## 2019-02-08 ENCOUNTER — Telehealth: Payer: Self-pay | Admitting: Neurology

## 2019-02-08 NOTE — Telephone Encounter (Signed)
Pt's sister called stating the pt wants to go ahead and get the constipation medication prescribed to him. They also would like to know if they can get help and information on [place]. Please advise.

## 2019-02-08 NOTE — Telephone Encounter (Signed)
I called and talk to the sister.  The patient generally goes anywhere from 3 to 7 days between bowel movements, he will alternate having constipation and diarrhea.  I have recommended Senokot to take if he does not have a bowel movement in 3 days, he is taking MiraLAX daily, he is also on docusate.  In the past he has taken mag citrate and Dulcolax which may be a bit aggressive at times, and could lead to diarrhea.  In regards to the assistance at home, it is possible his Medicare program could have some benefits in this regard, they are to call the 800-number and determine what his plan benefits are.

## 2019-02-08 NOTE — Telephone Encounter (Signed)
I called and left a message, I will call back later. 

## 2019-02-11 ENCOUNTER — Telehealth: Payer: Self-pay | Admitting: Neurology

## 2019-02-11 NOTE — Telephone Encounter (Signed)
Pts sister [name] [facility]) called in and stated pt has a bad dental problem and she has been giving him Tramadol and Aspirin for the pain but it seems as if its not helping much and she wants to know if a referral can be placed for a oral surgeon or can something be given for the pain  CB# [phone]

## 2019-02-11 NOTE — Telephone Encounter (Signed)
I called and left a message.  The patient is getting Ultram which seems to help the pain but if the patient requires more tablets and he is getting until he can see a dentist, they are to call me back and let me know.

## 2019-02-11 NOTE — Telephone Encounter (Signed)
I reached out to the [name] and she connected me with the pt. Pt states he has been having severe tooth ache pain. Pt states he believe he needs several teeth to be pulled. Pt states he does not have a current dentist so I encouraged him to seek care with a dentist for further work up. Pt wanted to know if Dr. Jannifer Franklin could recommend more pain medication to help him tolerate this pain? I did advised Dr. Jannifer Franklin is already prescribing tramadol and states this is helping with his tooth pain.  Pt states he is going to look for a dentist ( I advised he could check in to [facility] for an appointment) and I would ask Dr. Jannifer Franklin in regards to additional pain medication.

## 2019-02-25 ENCOUNTER — Other Ambulatory Visit: Payer: Self-pay | Admitting: Neurology

## 2019-03-09 ENCOUNTER — Other Ambulatory Visit: Payer: Self-pay | Admitting: Neurology

## 2019-03-19 ENCOUNTER — Telehealth: Payer: Self-pay | Admitting: Internal Medicine

## 2019-03-19 NOTE — Telephone Encounter (Signed)
03/19/2019 9:45am: I gave patient's sister and HCPOA Tyna Jaksch (formerly Linus [phone] a scheduled 2 month f/u TC, to check in and see if patient / family would wish a f/u PC visit. [name] shared that patient is stable. She mentioned that he is being followed by a physician in [facility], and that their office calles q 2 weeks to follow. [name] states she will call us on a prn basis, should she or patient wish patient to be seen. [name] has my contact information. Violeta Gelinas NP-C [phone]

## 2019-03-30 ENCOUNTER — Other Ambulatory Visit: Payer: Self-pay | Admitting: Neurology

## 2019-04-06 ENCOUNTER — Other Ambulatory Visit: Payer: Self-pay | Admitting: Neurology

## 2019-04-06 NOTE — Telephone Encounter (Signed)
Pending appt 05/20/2019.

## 2019-04-22 ENCOUNTER — Telehealth: Payer: Self-pay | Admitting: *Deleted

## 2019-04-22 NOTE — Telephone Encounter (Signed)
[place] at [phone] (fax: [phone]) and spoke w/ [place]. We received fax request for neurology clearance from Dr. Jannifer Franklin for tooth extraction/procedure. She states it is not urgent and can wait until Dr. Jannifer Franklin returns to office on Monday. She is aware he is on vacation this week. Nothing further needed.

## 2019-04-26 NOTE — Telephone Encounter (Signed)
Surgical clearance completed by Dr. Jannifer Franklin and faxed back to [facility].  Note placed on clearance form by Dr. Jannifer Franklin -- If general anesthesia is used, sedation may increase risk of apnea.

## 2019-04-27 ENCOUNTER — Other Ambulatory Visit: Payer: Self-pay | Admitting: Neurology

## 2019-05-03 ENCOUNTER — Other Ambulatory Visit: Payer: Self-pay | Admitting: Neurology

## 2019-05-20 ENCOUNTER — Ambulatory Visit: Payer: Medicare Other | Admitting: Neurology

## 2019-06-09 ENCOUNTER — Telehealth: Payer: Self-pay

## 2019-06-09 NOTE — Telephone Encounter (Signed)
Out going letter has been sent to the pt asking him to call and schedule an appt. This can be a telephone/ virtual appt.

## 2019-06-22 NOTE — Progress Notes (Addendum)
PATIENT: [place] DOB: 10-20-1960  REASON FOR VISIT: follow up HISTORY FROM: patient  HISTORY OF PRESENT ILLNESS: Today 06/23/19  Mr. 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  He remains on Ocrevus, he receives his infusions at our office.  He indicates he is not able to discern any change, but reports overall slow progression.  He has a suprapubic catheter.  They are working with urology to establish with a home health agency who will be able to come out on a monthly basis to provide catheter care.  He remains in a wheelchair.  He has no use of the right arm or legs.  He has minimal use of the left arm. He is not able to feed himself.  His constipation has improved with the use of daily probiotic.  He says on average he will have a bowel movement every 3 days.  He lives with his sister, [name] who is his primary caregiver.  He indicates his headaches have improved while taking Topamax.  He is planning to have several teeth extracted, but the procedure had to be pushed to January, because they didn't want it to occur too close to his Ocrevus infusion.  He does receive a Ifeanyi for the Ocrevus medication.  His next infusion is scheduled for November 23, which is a little late, to allow time for paperwork with the [facility] funding.  He is prescribed tramadol to take on days where he travels out of the home or if he has a catheter change.  It is becoming increasingly more difficult for him to be taken out of the home.  From this office he is prescribed Abilify, baclofen, Wellbutrin, Valium, Remeron, tizanidine, Topamax, and tramadol.  He presents today for follow-up accompanied by his sister.  HISTORY 05/19/2018 Dr. Jannifer Franklin: Mr. 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  The patient is on Ocrevus, his next dose is in early October 2019.  The patient was seen  in the hospital on 20 April 2018 with a urinary tract infection.  The patient is followed through urology, he wishes to switch physicians, however.  The patient has been suffering from increased problems with depression, he is having frequent crying spells.  The patient was recently placed on Wellbutrin, he has been on the 300 mg dose for about 1 week.  He takes perphenazine 2 mg twice daily.  The patient is having some problems with headaches that are occurring at least twice a week, the headaches are around the right eye, they are responsive to Advil.  The patient has no use of his arms and legs at this point, he has to be fed.  The patient also has problems with temperature instability, he feels hot at one point and then cold the next, he cannot seem to get his body temperature right.  The patient overall is relatively miserable, his family is helping to take care of him.  He is concerned about his financial situation.   REVIEW OF SYSTEMS: Out of a complete 14 system review of symptoms, the patient complains only of the following symptoms, and all other reviewed systems are negative.  Numbness, weakness, headache   ALLERGIES: Allergies  Allergen Reactions  . Penicillins Other (See Comments)    > 20 yr ago, doesn't recall nature of reaction (didn't require hospitalization) Tolerates Ancef, Rocephin, and Cefepime per chart review Does not recall having  taken amox/amp since initial rxn; none documented in Epic   . Primaxin [Imipenem] Other (See Comments)    Seizure     HOME MEDICATIONS: Outpatient Medications Prior to Visit  Medication Sig Dispense Refill  . acetic acid 0.25 % irrigation Irrigate with as directed daily. Instill 30ml and clamp tube for 30 minutes then drain. 500 mL 12  . ARIPiprazole (ABILIFY) 5 MG tablet Take 1 tablet by mouth once daily 90 tablet 0  . AZO-CRANBERRY PO Take 1 tablet by mouth 2 (two) times daily.    . baclofen (LIORESAL) 20 MG tablet TAKE 2 TABLETS BY MOUTH  THREE TIMES DAILY 540 each 2  . buPROPion (WELLBUTRIN XL) 150 MG 24 hr tablet Take 2 tablets by mouth once daily 60 tablet 2  . clotrimazole-betamethasone (LOTRISONE) cream Apply 1 application topically 2 (two) times daily.    . diazepam (VALIUM) 5 MG tablet TAKE 1 TABLET BY MOUTH THREE TIMES DAILY 270 tablet 1  . Hydrocortisone (GERHARDT'S BUTT CREAM) CREA Apply 1 application topically 3 (three) times daily. 1 each 0  . levothyroxine (SYNTHROID, LEVOTHROID) 125 MCG tablet Take 125 mcg by mouth daily.    Marland Kitchen lisinopril-hydrochlorothiazide (PRINZIDE,ZESTORETIC) 10-12.5 MG tablet Take 0.5 tablets by mouth daily.    Marland Kitchen liver oil-zinc oxide (DESITIN) 40 % ointment Apply 1 application topically as needed for irritation.    . magnesium citrate SOLN Take 0.5 Bottles by mouth daily as needed for moderate constipation or severe constipation.    . mirtazapine (REMERON) 30 MG tablet TAKE 1 TABLET BY MOUTH AT BEDTIME 90 tablet 1  . MYRBETRIQ 50 MG TB24 tablet     . omeprazole (PRILOSEC) 20 MG capsule Take 20 mg by mouth every morning.     . polyethylene glycol (MIRALAX / GLYCOLAX) packet Take 17 g by mouth daily. (Patient taking differently: Take 17 g by mouth as needed. ) 14 each 0  . Probiotic Product (PROBIOTIC-10 PO) Take by mouth daily.    Marland Kitchen tiZANidine (ZANAFLEX) 4 MG tablet TAKE 2 TABLETS BY MOUTH THREE TIMES DAILY 540 tablet 0  . topiramate (TOPAMAX) 25 MG tablet Take 2 tablets (50 mg total) by mouth at bedtime. 180 tablet 1  . traMADol (ULTRAM) 50 MG tablet TAKE 1 TABLET BY MOUTH EVERY 6 HOURS AS NEEDED 60 tablet 5  . Vitamin D, Ergocalciferol, (DRISDOL) 50000 UNITS CAPS Take 50,000 Units by mouth every 7 (seven) days. Friday only    . Vitamins A & D (VITAMIN A & D) ointment Apply 1 application topically as needed for dry skin.    . fosfomycin (MONUROL) 3 g PACK Take 3 g by mouth. Three times weekly    . tamsulosin (FLOMAX) 0.4 MG CAPS capsule Take 1 capsule (0.4 mg total) by mouth daily. (Patient not  taking: Reported on 06/23/2019) 14 capsule 2   Facility-Administered Medications Prior to Visit  Medication Dose Route Frequency Provider Last Rate Last Dose  . botulinum toxin Type A (BOTOX) injection 300 Units  300 Units Intramuscular Once Levert Feinstein, MD        PAST MEDICAL HISTORY: Past Medical History:  Diagnosis Date  . Anxiety   . Bladder calculi   . Chronic back pain   . Chronic fatigue   . Depression   . Foley catheter in place   . Gait disorder   . GERD (gastroesophageal reflux disease)   . History of kidney stones   . Hyperlipidemia   . Hypertension   . Hypothyroidism   .  Incontinence of urine   . Multiple sclerosis (HCC) NEUROLOGIST-- [name]   dx 1993, JC virus negative 2/14---  CURRENTLY RECEIVING TYSABRI IV TREATMENT  . Neurogenic bladder   . Neurogenic bowel    intermittant constipation and diarrhea  . Pneumonia    dx  03-03-2014--  admitted for iv and oral antibiotics  . 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 Urinary retention   . Vitamin D deficiency   . Yeast infection    genital area    PAST SURGICAL HISTORY: Past Surgical History:  Procedure Laterality Date  . APPENDECTOMY  age 89  . CYSTOSCOPY N/A 03/21/2014   Procedure: CYSTOSCOPY TREATMENT OF BLADDER CALCULI;  Surgeon: [name], MD;  Location: [facility];  Service: Urology;  Laterality: N/A;  . CYSTOSCOPY WITH BIOPSY N/A 12/29/2017   Procedure: TRANSURETHRAL RESECTION OF BLADDER TUMOR;  Surgeon: [name], MD;  Location: [facility];  Service: Urology;  Laterality: N/A;  . CYSTOSCOPY WITH RETROGRADE PYELOGRAM, URETEROSCOPY AND STENT PLACEMENT Right 12/11/2017   Procedure: CYSTOSCOPY WITH RETROGRADE PYELOGRAM, URETEROSCOPY AND STENT PLACEMENT RIGHT;  Surgeon: [name], MD;  Location: [facility];  Service: Urology;  Laterality: Right;  . CYSTOSCOPY WITH RETROGRADE PYELOGRAM, URETEROSCOPY AND STENT PLACEMENT Right 12/29/2017   Procedure: CYSTOSCOPY WITH RIGHT   RETROGRADE PYELOGRAM, URETEROSCOPY/LASER LITHOTRIPSY AND STENT PLACEMENT;  Surgeon: [name], MD;  Location: [facility];  Service: Urology;  Laterality: Right;  . INSERTION OF SUPRAPUBIC CATHETER N/A 03/21/2014   Procedure: INSERTION OF SUPRAPUBIC CATHETER;  Surgeon: [name], MD;  Location: [facility];  Service: Urology;  Laterality: N/A;  . NASAL SEPTUM SURGERY  1996  . NEGATIVE SLEEP STUDY  1996  . TRANSTHORACIC ECHOCARDIOGRAM  02-07-2004   MILD LVH/  EF 55-65%    FAMILY HISTORY: Family History  Problem Relation Age of Onset  . Stroke Father   . Cirrhosis Brother   . Hypothyroidism Sister     SOCIAL HISTORY: Social History   Socioeconomic History  . Marital status: Divorced    Spouse name: Not on file  . Number of children: 0  . Years of education: college  . Highest education level: Not on file  Occupational History  . Occupation: Curator    Comment: disability  Social Needs  . Financial resource strain: Not on file  . Food insecurity    Worry: Not on file    Inability: Not on file  . Transportation needs    Medical: Not on file    Non-medical: Not on file  Tobacco Use  . Smoking status: Former Smoker    Packs/day: 1.50    Years: 8.00    Pack years: 12.00    Types: Cigarettes    Quit date: 06/17/1985    Years since quitting: 34.0  . Smokeless tobacco: Never Used  Substance and Sexual Activity  . Alcohol use: No    Alcohol/week: 0.0 standard drinks  . Drug use: No  . Sexual activity: Not on file  Lifestyle  . Physical activity    Days per week: Not on file    Minutes per session: Not on file  . Stress: Not on file  Relationships  . Social Musician on phone: Not on file    Gets together: Not on file    Attends religious service: Not on file    Active member of club or organization: Not on file    Attends meetings of clubs or  organizations: Not on file    Relationship status: Not on file  . Intimate partner  violence    Fear of current or ex partner: Not on file    Emotionally abused: Not on file    Physically abused: Not on file    Forced sexual activity: Not on file  Other Topics Concern  . Not on file  Social History Narrative   Patient is right handed.   Patient does not drink caffeine.      [facility]:   Patient currently lives alone. He has 3 separate caregivers who help care for him in his own home. His mother and sister also helped in the evening to get him ready for bed. He reports he does have a dog.    PHYSICAL EXAM  Vitals:   06/23/19 1259  BP: 128/87  Pulse: (!) 102  Temp: 98.4 F (36.9 C)  Weight: 160 lb (72.6 kg)  Height: 6' (1.829 m)   Body mass index is 21.7 kg/m.  Generalized: Well developed, in no acute distress   Neurological examination  Mentation: Alert oriented to time, place, history taking. Follows all commands speech and language fluent Cranial nerve II-XII: Pupils were equal round reactive to light.  Facial sensation and strength were normal. Head turning was normal.  Motor: He has almost no function of all 4 extremities, the right arm is in flexion, legs are in extension Sensory: Sensory testing is intact to soft touch on all 4 extremities. No evidence of extinction is noted.  Coordination: Unable to perform finger-nose-finger or heel shin Gait and station: He is in a wheelchair and is not able to ambulate Reflexes: Deep tendon reflexes are symmetric  DIAGNOSTIC DATA (LABS, IMAGING, TESTING) - I reviewed patient records, labs, notes, testing and imaging myself where available.  Lab Results  Component Value Date   WBC 13.1 (H) 01/13/2019   HGB 17.0 01/13/2019   HCT 50.5 01/13/2019   MCV 87 01/13/2019   PLT 292 01/13/2019      Component Value Date/Time   NA 141 01/13/2019 1352   K 4.1 01/13/2019 1352   CL 104 01/13/2019 1352   CO2 18 (L) 01/13/2019 1352   GLUCOSE 169 (H) 01/13/2019 1352   GLUCOSE 110 (H) 09/18/2018 0325   BUN  14 01/13/2019 1352   CREATININE 0.70 (L) 01/13/2019 1352   CALCIUM 9.7 01/13/2019 1352   PROT 6.9 01/13/2019 1352   ALBUMIN 4.8 01/13/2019 1352   AST 26 01/13/2019 1352   ALT 34 01/13/2019 1352   ALKPHOS 195 (H) 01/13/2019 1352   BILITOT 0.3 01/13/2019 1352   GFRNONAA 104 01/13/2019 1352   GFRAA 120 01/13/2019 1352   No results found for: CHOL, HDL, LDLCALC, LDLDIRECT, TRIG, CHOLHDL No results found for: ZOXW9UHGBA1C No results found for: VITAMINB12 Lab Results  Component Value Date   TSH 7.893 (H) 09/17/2018    ASSESSMENT AND PLAN 58 y.o. year old male  has a past medical history of Anxiety, Bladder calculi, Chronic back pain, Chronic fatigue, Depression, Foley catheter in place, Gait disorder, GERD (gastroesophageal reflux disease), History of kidney stones, Hyperlipidemia, Hypertension, Hypothyroidism, Incontinence of urine, Multiple sclerosis (HCC) (NEUROLOGIST-- DR Anne HahnWILLIS), Neurogenic bladder, Neurogenic bowel, Pneumonia, Spastic quadriparesis (HCC) (04/19/2013), Urinary retention, Vitamin D deficiency, and Yeast infection. here with:  1.  Secondary Progressive Multiple sclerosis 2.  Spastic quadriparesis 3.  Depression 4.  Neurogenic bladder 5.  Headache  He will remain on his current medications.  He will continue on Ocrevus. He  continues to report a gradual decline in his MS. I will check a CBC and CMP today.  I will refill his Abilify, Wellbutrin, and tizanidine.  They are working with his urologist to get established with a home health company who can provide catheter care in the home.  It is becoming increasingly difficult to transfer him out of the home.  He will follow-up at this office on a yearly basis or sooner if needed. It is okay for him to get a flu vaccine, his next Ocrevus infusion is scheduled for November 23rd so now would be a good time to receive the flu vaccine. It is better he receive this vaccine a few weeks before his next infusion to allow his body to build a  response. He should never receive any live vaccines.   I spent 25 minutes with the patient. 50% of this time was spent discussing his plan of care.   Margie EgeSarah Anivea Velasques, AGNP-C, DNP 06/23/2019, 1:22 PM [facility] [address], [address] [place], [place] [address] [phone]

## 2019-06-23 ENCOUNTER — Ambulatory Visit (INDEPENDENT_AMBULATORY_CARE_PROVIDER_SITE_OTHER): Payer: Medicare Other | Admitting: Neurology

## 2019-06-23 ENCOUNTER — Encounter: Payer: Self-pay | Admitting: Neurology

## 2019-06-23 ENCOUNTER — Other Ambulatory Visit: Payer: Self-pay

## 2019-06-23 VITALS — BP 128/87 | HR 102 | Temp 98.4°F | Ht 72.0 in | Wt 160.0 lb

## 2019-06-23 DIAGNOSIS — G8114 Spastic hemiplegia affecting left nondominant side: Secondary | ICD-10-CM

## 2019-06-23 DIAGNOSIS — N319 Neuromuscular dysfunction of bladder, unspecified: Secondary | ICD-10-CM

## 2019-06-23 DIAGNOSIS — G35 Multiple sclerosis: Secondary | ICD-10-CM | POA: Diagnosis not present

## 2019-06-23 MED ORDER — ARIPIPRAZOLE 5 MG PO TABS: 5.0000 mg | ORAL_TABLET | Freq: Every day | ORAL | 1 refills | Status: DC

## 2019-06-23 MED ORDER — BUPROPION HCL ER (XL) 150 MG PO TB24: 300.0000 mg | ORAL_TABLET | Freq: Every day | ORAL | 5 refills | Status: DC

## 2019-06-23 MED ORDER — TIZANIDINE HCL 4 MG PO TABS: 8.0000 mg | ORAL_TABLET | Freq: Three times a day (TID) | ORAL | 1 refills | Status: DC

## 2019-06-23 NOTE — Patient Instructions (Signed)
Please continue current medications. I will check lab work.  

## 2019-06-23 NOTE — Progress Notes (Signed)
I have read the note, and I agree with the clinical assessment and plan.  Khilynn Borntreger K Catrell Morrone   

## 2019-06-24 ENCOUNTER — Telehealth: Payer: Self-pay | Admitting: Neurology

## 2019-06-24 DIAGNOSIS — G35 Multiple sclerosis: Secondary | ICD-10-CM

## 2019-06-24 LAB — COMPREHENSIVE METABOLIC PANEL
ALT: 33 IU/L (ref 0–44)
AST: 17 IU/L (ref 0–40)
Albumin/Globulin Ratio: 2 (ref 1.2–2.2)
Albumin: 4.7 g/dL (ref 3.8–4.9)
Alkaline Phosphatase: 190 IU/L — ABNORMAL HIGH (ref 39–117)
BUN/Creatinine Ratio: 16 (ref 9–20)
BUN: 12 mg/dL (ref 6–24)
Bilirubin Total: 0.2 mg/dL (ref 0.0–1.2)
CO2: 19 mmol/L — ABNORMAL LOW (ref 20–29)
Calcium: 9.5 mg/dL (ref 8.7–10.2)
Chloride: 101 mmol/L (ref 96–106)
Creatinine, Ser: 0.74 mg/dL — ABNORMAL LOW (ref 0.76–1.27)
GFR calc Af Amer: 118 mL/min/{1.73_m2} (ref >59)
GFR calc non Af Amer: 102 mL/min/{1.73_m2} (ref >59)
Globulin, Total: 2.3 g/dL (ref 1.5–4.5)
Glucose: 90 mg/dL (ref 65–99)
Potassium: 3.8 mmol/L (ref 3.5–5.2)
Sodium: 139 mmol/L (ref 134–144)
Total Protein: 7 g/dL (ref 6.0–8.5)

## 2019-06-24 LAB — CBC WITH DIFFERENTIAL/PLATELET
Basophils Absolute: 0.2 10*3/uL (ref 0.0–0.2)
Basos: 1 %
EOS (ABSOLUTE): 0.3 10*3/uL (ref 0.0–0.4)
Eos: 2 %
Hematocrit: 46.4 % (ref 37.5–51.0)
Hemoglobin: 15.5 g/dL (ref 13.0–17.7)
Lymphocytes Absolute: 3.6 10*3/uL — ABNORMAL HIGH (ref 0.7–3.1)
Lymphs: 21 %
MCH: 28.4 pg (ref 26.6–33.0)
MCHC: 33.4 g/dL (ref 31.5–35.7)
MCV: 85 fL (ref 79–97)
Monocytes Absolute: 0.7 10*3/uL (ref 0.1–0.9)
Monocytes: 4 %
Neutrophils Absolute: 11.2 10*3/uL — ABNORMAL HIGH (ref 1.4–7.0)
Neutrophils: 65 %
Platelets: 413 10*3/uL (ref 150–450)
RBC: 5.45 x10E6/uL (ref 4.14–5.80)
RDW: 12.9 % (ref 11.6–15.4)
WBC: 17.3 10*3/uL — ABNORMAL HIGH (ref 3.4–10.8)

## 2019-06-24 LAB — IMMATURE CELLS
MYELOCYTES: 3 % — ABNORMAL HIGH (ref 0–0)
Metamyelocytes: 4 % — ABNORMAL HIGH (ref 0–0)

## 2019-06-24 NOTE — Telephone Encounter (Signed)
I called the patient and spoke to he and his sister, Samuel [name].  Laboratory evaluation shows an elevated WBC count of 17.3.  They deny any signs of infection such as fever, cough, or urinary symptoms, but the patient does have a suprapubic catheter.  They are to watch out for signs of infection over the next few days.  He does have some teeth that need to be extracted, but have not been bothersome as of lately.    I will try to get him set up for home health to receive his Ocrevus infusions in the home, since it is getting increasingly more difficult to transfer him.

## 2019-06-28 ENCOUNTER — Telehealth: Payer: Self-pay | Admitting: *Deleted

## 2019-06-28 NOTE — Telephone Encounter (Signed)
I completed enrollment for [facility] for pt.  Reviewed and fax confirmation received.  [phone].

## 2019-06-29 NOTE — Telephone Encounter (Signed)
I called [facility].  They did receive the enrollment form for the ocrevus.  They had everything they needed at this time.  If need something later they will let us know.  (I relayed wanted to make sure they received).  I spoke to Anon Raices.  [phone].

## 2019-07-05 NOTE — Telephone Encounter (Signed)
[facility] is faxing over a PA form.

## 2019-07-05 NOTE — Telephone Encounter (Addendum)
I received from [place] (that needs PA on ocrevus.  I called and relayed thru [place] with [facility] [phone] that no PA needed if pt has free drug.  I spoke to [place] that pt is changing venue to outpt infusion suite to [facility] due to mobilty issues.  Then spoke to Danielson, pt navigator with [facility]cre[facility]us access solutions [phone]. She relayed that free drug program gets [facility] spec drug provider to ship med to home of pt day prior to infusion.   [phone].  [facility]HA is then made aware (coordination).  After trying to get in touch with [facility] spec [phone] and them saying they did not have this information.  I called tiffany winn relayed the need for pt.  She recommended to refax  all the information to [phone], confirmation received. She relayed that they will look at and then decide if can do this.

## 2019-07-07 NOTE — Telephone Encounter (Signed)
I have sent to Samuel [name] at [facility] have declined him already . Patient and his sister are aware that Im trying even his urologist office is trying to .

## 2019-07-08 NOTE — Telephone Encounter (Signed)
I called Tiffany at [employer].  She will check into this and get back with me.

## 2019-07-08 NOTE — Telephone Encounter (Signed)
I spoke to [employer].  This is in clinical review. Will connect next week to see where we are.

## 2019-07-08 NOTE — Telephone Encounter (Signed)
Samuel [name] responded  back from [place] . She needs a PA ocrevus . Jinny Blossom Rn will get PA . Thanks Hinton Dyer

## 2019-07-12 NOTE — Telephone Encounter (Signed)
Spoke to [employer] with [facility].  She has not heard anything yet (clinical review).  She was out driving will check when back in the office.  She will send me an email.

## 2019-07-12 NOTE — Telephone Encounter (Signed)
Carolynn Sayers from [facility] called needing to speak to RN. Please advise.

## 2019-07-13 NOTE — Telephone Encounter (Signed)
I sent an email to [place] at [facility] asking status of pts ocrevus with HH infusion.

## 2019-07-14 NOTE — Telephone Encounter (Signed)
Fax confirmation received [phone] [facility], tiffany with Hep B labs to be placed on file.

## 2019-07-14 NOTE — Telephone Encounter (Signed)
Carolynn Sayers from [facility] called in requesting a call back to discuss Ocrevus Infusion and [facility]   CB# [phone]

## 2019-07-14 NOTE — Telephone Encounter (Signed)
I called [name], and spoke to her about who she would like to proceed with [facility] or [facility]/ [facility].  She was ok to proceed with [facility] since started with them .  I spoke to [employer]  She felt they had what they needed on there end awaiting authorization and plan to proceed with time frame 07-26-19 infusion which is already late from end oct per sister, donna.  I cancelled [facility], spoke to [facility] to really to Lelan Pons, and also Pam with [facility].

## 2019-07-14 NOTE — Telephone Encounter (Signed)
Received email 07-13-19 from [place] with [facility].  They have not been able to get in touch with pt.  I called donna, sister of pt who is caregiver to pt.  She relayed that she has spoken with [facility] (received call from [place], then spoke to garrett 07-01-19, then received call from [facility] about infusion.  I gave her Tiffany's # as well to call.  I sent email to tiffany about this as well.  I relayed to sister, [name] that yesterday Jeb Levering spoke to [place] about doing infusion as well. So may receive call.  We are trying to get pt [facility] asap due for ocrevus around 07-26-19.

## 2019-07-15 ENCOUNTER — Telehealth: Payer: Self-pay | Admitting: Neurology

## 2019-07-15 NOTE — Telephone Encounter (Signed)
I called and spoke to [facility] with [facility] about denial of ocrevus. (per denial letter states that denied :stating that is not used in combination with another disease modifying therapy for MS.  I relayed that this was not stated on PA done thru [facility], I did not understand where it came from.  She stated that we would have to do an appeal, I relayed that I did not agree with this as was not relayed on the PA/CMM record.  She just reiterated her stated an appeal had to be done and she could not do anything on her end.  I reiterated again my disagreement with decision.  I called and LVM for Tiffany at [facility].  I called spoke to [name] at [facility] for pt.  I relayed that pt on free drug, just need infusion at pts home using the free drug already authorized.  She  [name] stated that they provide the medication only, she would check with her higher ups re: infusing.  I called and spoke to Mardene Celeste with [facility], and relayed the issue I was having.  She would see about infusion HH agencies to infuse pt at home.

## 2019-07-15 NOTE — Telephone Encounter (Signed)
I spoke to [place] from [employer] access solutions.  She relayed that 2 out of 3 HH infusion co did not infuse free drug.  [facility] in [place], [place], [place] [address] (did previously but she had to LM to speak to manager if this was still a possiblility.  Deborha Payment are to call back.  Pt navigators for our office : Ayly, rhiannon, and Maylon Cos were informed as well.  There ext [phone].

## 2019-07-15 NOTE — Telephone Encounter (Signed)
Pt's sister [name] called stating that she just received a recording stating that the pt's Ocrevus is being denied and she is wanting to know if RN also received this information. Please advise.

## 2019-07-15 NOTE — Telephone Encounter (Signed)
I received fax from [facility] [phone] , fax [phone] to complete PA thru [facility].  I completed the [facility] PA today at 07-15-19 1114. (sent to [facility]).  Determination pending.

## 2019-07-15 NOTE — Telephone Encounter (Signed)
error 

## 2019-07-19 ENCOUNTER — Other Ambulatory Visit: Payer: Self-pay

## 2019-07-19 MED ORDER — BACLOFEN 20 MG PO TABS: 40.0000 mg | ORAL_TABLET | Freq: Three times a day (TID) | ORAL | 2 refills | Status: DC

## 2019-07-19 NOTE — Telephone Encounter (Signed)
Received from [facility], Liane about possible [facility] infusion pt at home.  I spoke to Anise Salvo [phone] and she will reach out to branch and have them call me back.  Pt is [employer] Medicare.

## 2019-07-19 NOTE — Telephone Encounter (Signed)
I called Sharyn Lull with [facility].  [place] branch.  [phone], fax [phone].  I explained the situation with pt.  Needing HHN for pts free drug ocrevus  I faxed and received fax confirmation for records sent to them relating to pt.  They will determine if can help and let me know.

## 2019-07-19 NOTE — Telephone Encounter (Signed)
Pt's sister [name] called needing to speak to RN about some additional information on the pt's Ocrevus. Please advise.

## 2019-07-19 NOTE — Telephone Encounter (Signed)
I spoke to pts sister, caregiver and keeping her updated.

## 2019-07-21 NOTE — Telephone Encounter (Signed)
Received fax with ocrevus orders signed and faxed to [facility] [phone],  [phone].

## 2019-07-21 NOTE — Telephone Encounter (Signed)
I spoke to [facility] yesterday late AM.  She stated they were looking for [facility] certified provider to do the infusion.  They would call us back.  I spoke to Penn from [facility] this am.  I have not heard back from [place].  [place] whom we had seen in the office last week, I called her this am.  She stated they will be glad to infuse the free drug for this pt in pts home, let them know when drug is at home.   I relayed to Liane with [facility].  She will call to get drug shipped to pts home, then date for infusion to be set up.

## 2019-07-22 NOTE — Telephone Encounter (Signed)
I called Samuel [name], sister of pt.  I relayed that Samuel [name] is the Home infusion Rn to come out and give pt the Ocrevus.   [facility] will have med sent to their home on Saturday.  This will be stored in refrigerator until she is out there on 08-05-19 to infuse pt.  She was appreciative of all involved re: to getting this infused at home for pt.  She will call us back as needed.

## 2019-08-04 ENCOUNTER — Telehealth: Payer: Self-pay | Admitting: Neurology

## 2019-08-04 NOTE — Telephone Encounter (Signed)
1) Medication(s) Requested (by name): diazepam (VALIUM) 5 MG tablet   2) Pharmacy [facility]:  [facility] [place] [address]

## 2019-08-04 NOTE — Telephone Encounter (Signed)
Called and spoke sister, [name]. Advised per drug registry he last refilled 05/31/19 #270 (90days supply). Took soon to refill. Advised they can call week on 08/23/19 for refill. She verbalized understanding.

## 2019-08-05 ENCOUNTER — Other Ambulatory Visit: Payer: Self-pay | Admitting: Neurology

## 2019-08-09 NOTE — Telephone Encounter (Signed)
Samuel C--- with ocrevus called to report they have not been able to get in contact with Samuel [name] with [facility] to see if they are able to get the patient in with the home infusion states they are unsure who will support order with free medication. Please follow up  CB#[phone]  [id number]

## 2019-08-09 NOTE — Telephone Encounter (Signed)
I called Samuel [name] back with [facility] and pt was to be infused 08-05-19 at [facility] by [facility].  He will touch base with them and let me know after this.

## 2019-08-10 ENCOUNTER — Telehealth: Payer: Self-pay | Admitting: Neurology

## 2019-08-10 NOTE — Telephone Encounter (Signed)
I called and talk with the patient.  The patient is on Ocrevus, this can mute the response to the vaccination, but the covered vaccination is not a live virus vaccination and can be taken by the patient.  I would recommend that he get the vaccination 6 to 8 weeks prior to his next injection of the Ocrevus which should be in mid April 2021.  He is due for his next injection in the beginning of June 2021.

## 2019-08-10 NOTE — Telephone Encounter (Signed)
Pts sister [name] (on [place]) called in and stated pt is wanting to know if he will be able to get the COVID vaccine

## 2019-08-13 ENCOUNTER — Telehealth: Payer: Self-pay | Admitting: *Deleted

## 2019-08-13 NOTE — Telephone Encounter (Signed)
Received fax about adverse event. )pt is nonambulatory.  I responded to form stating pt has been nonambulatory since prior to ocrevus.  Needs signature then will fax back. (this came about due to pt going to [facility] for his infusions).

## 2019-08-17 NOTE — Telephone Encounter (Signed)
Form was signed 

## 2019-08-17 NOTE — Telephone Encounter (Signed)
Form has been faxed to [facility]. Confirmation fax has been received.

## 2019-08-20 IMAGING — CR DG CHEST 2V
2 series · 2 of 2 positions shown · non-contrast
Comparison: PA and lateral chest 04/20/2018.

CLINICAL DATA: Shortness of breath.

EXAM:
CHEST - 2 VIEW

[w chest lat]
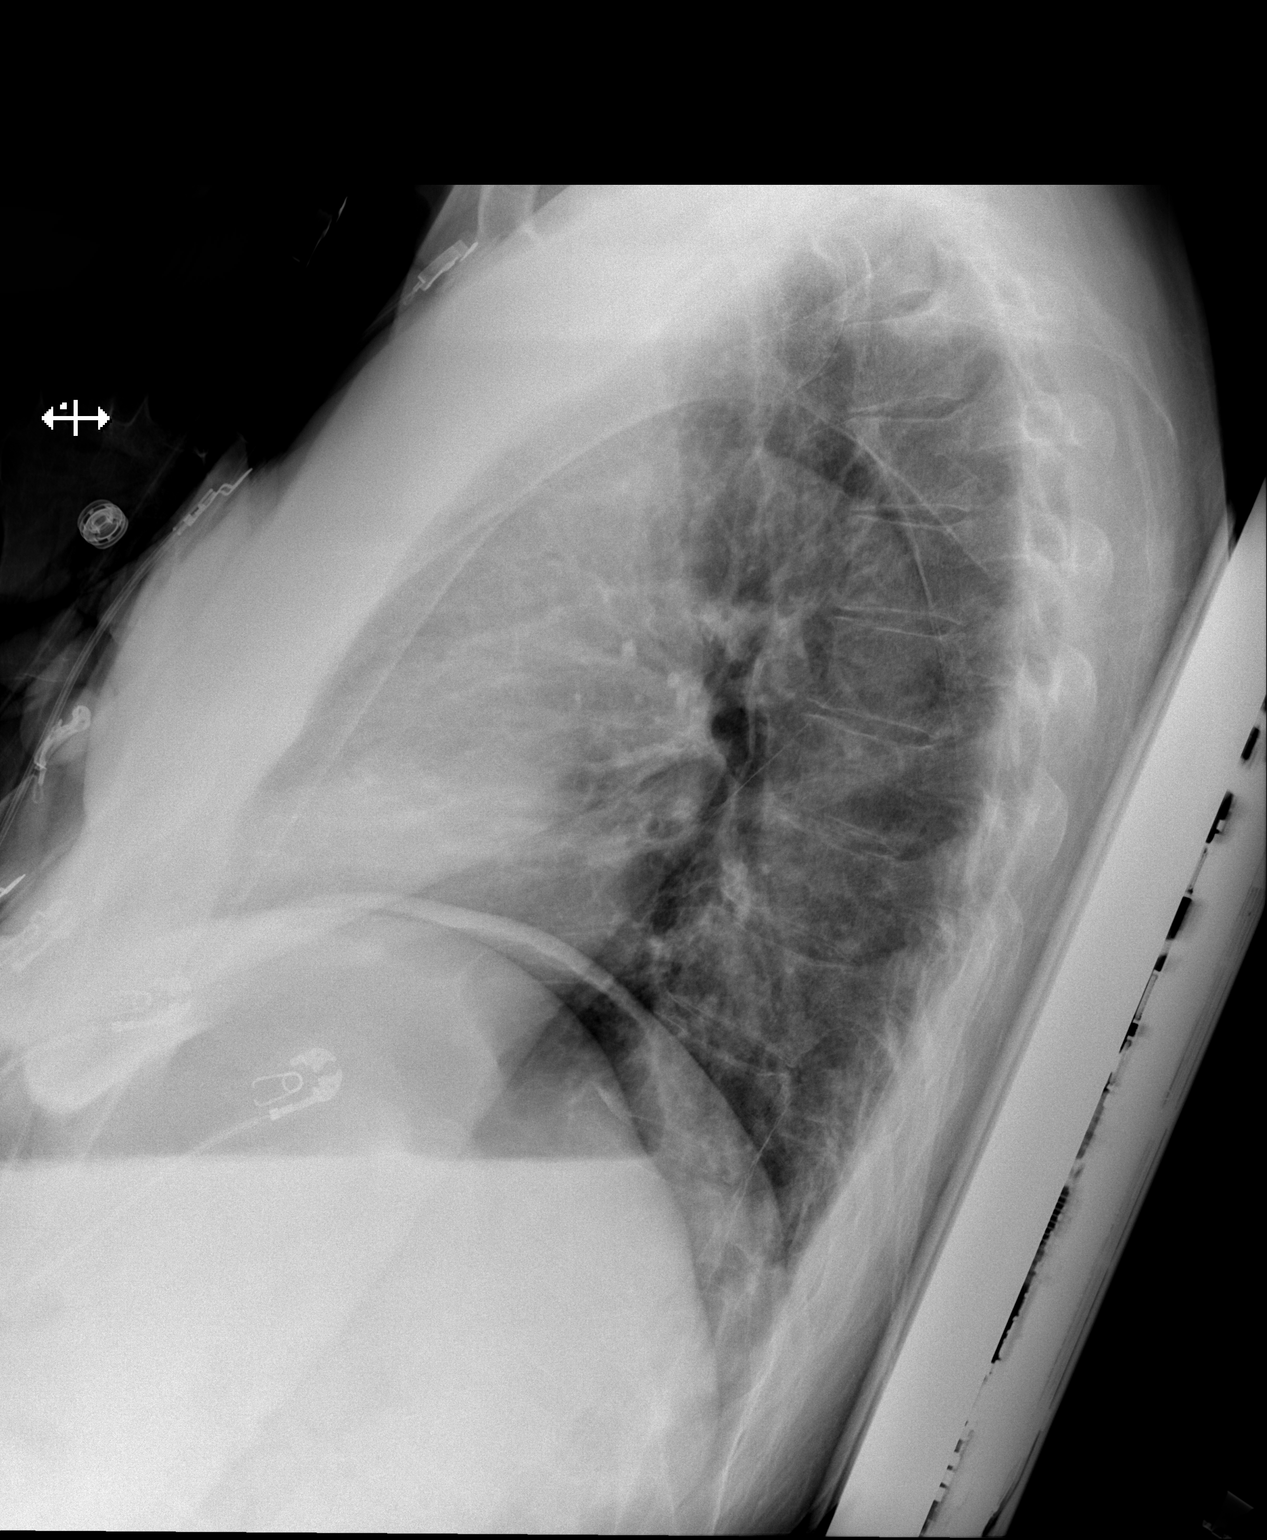

[x chest ap]
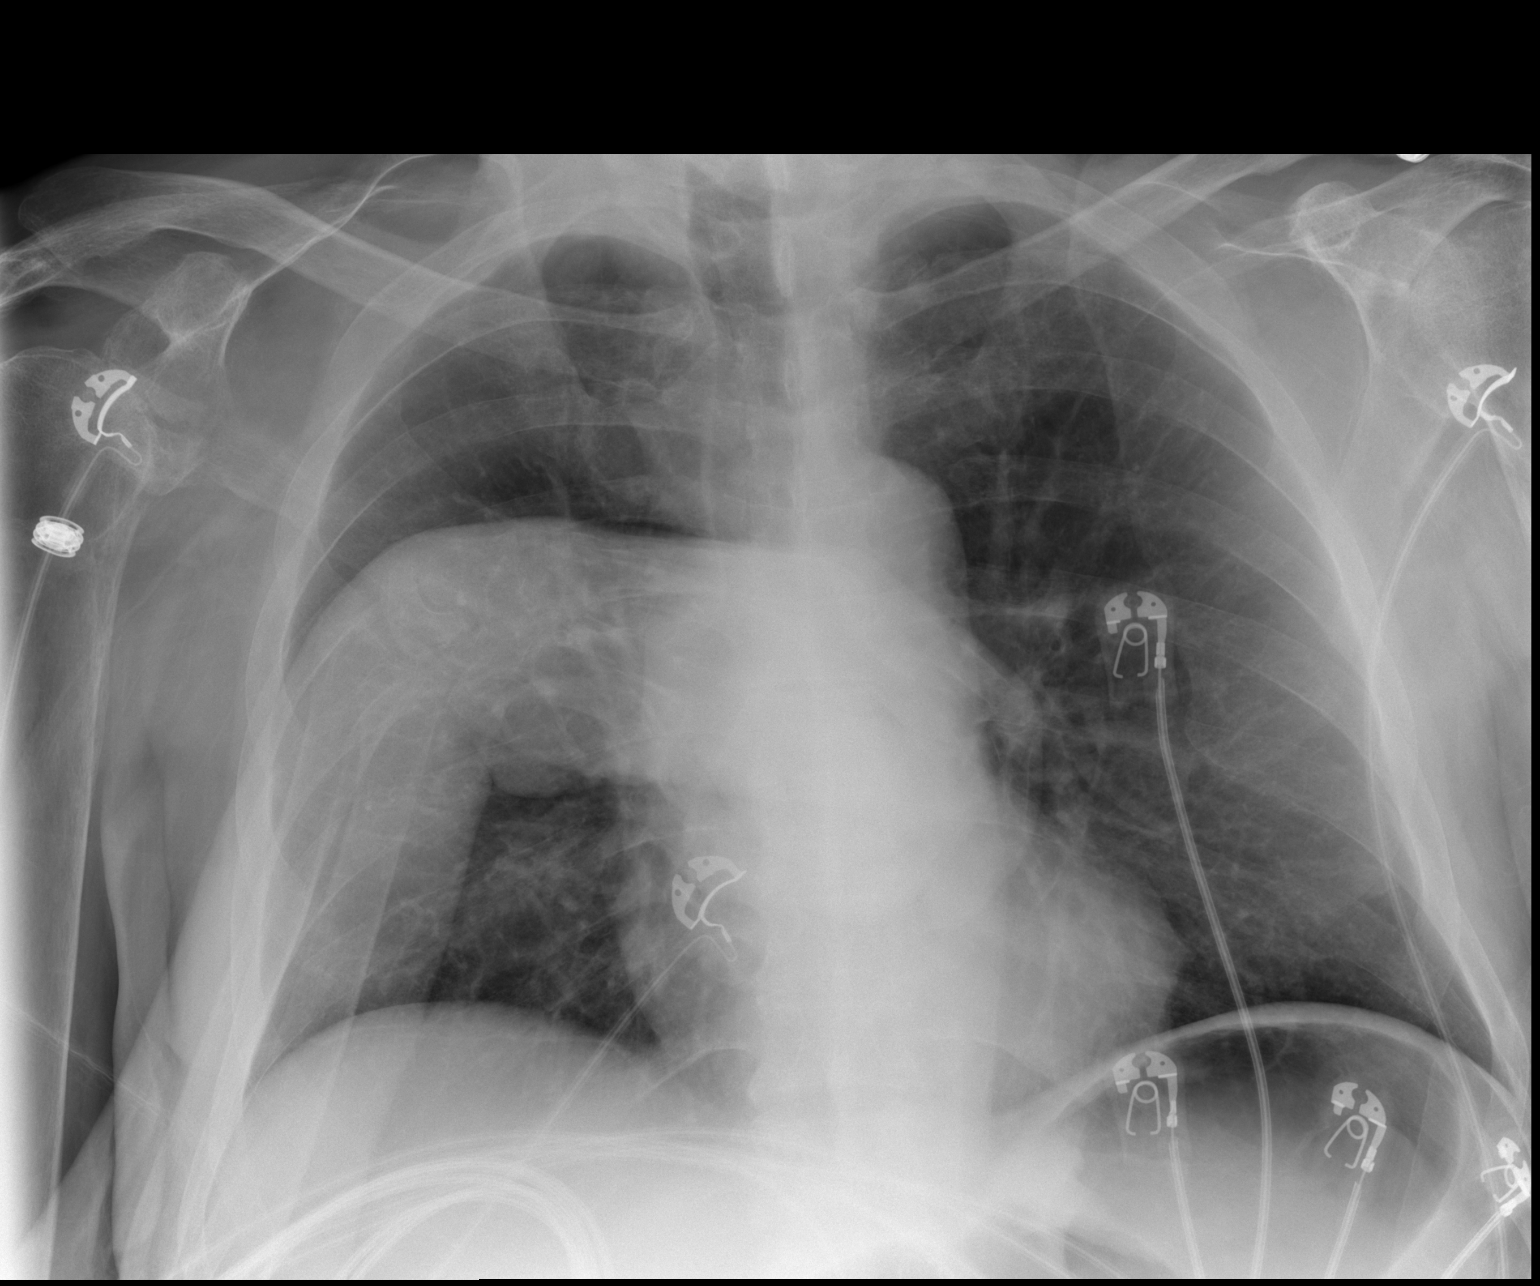

[2 of 2 positions shown; findings below may reference images not displayed]

FINDINGS: The lungs are clear. Heart size is normal. No pneumothorax or
pleural fluid. No acute or focal bony abnormality. The patient
distal right forearm and hand over the right chest as he is unable
to move his right arm.
IMPRESSION: No acute disease.

## 2019-08-21 IMAGING — DX DG ABDOMEN 1V
2 series · 2 of 2 positions shown · non-contrast
Comparison: 04/07/2018

CLINICAL DATA: Left flank pain. Chronic sacrococcygeal pain.

EXAM:
ABDOMEN - 1 VIEW

[abdomen kub (1 of 2)]
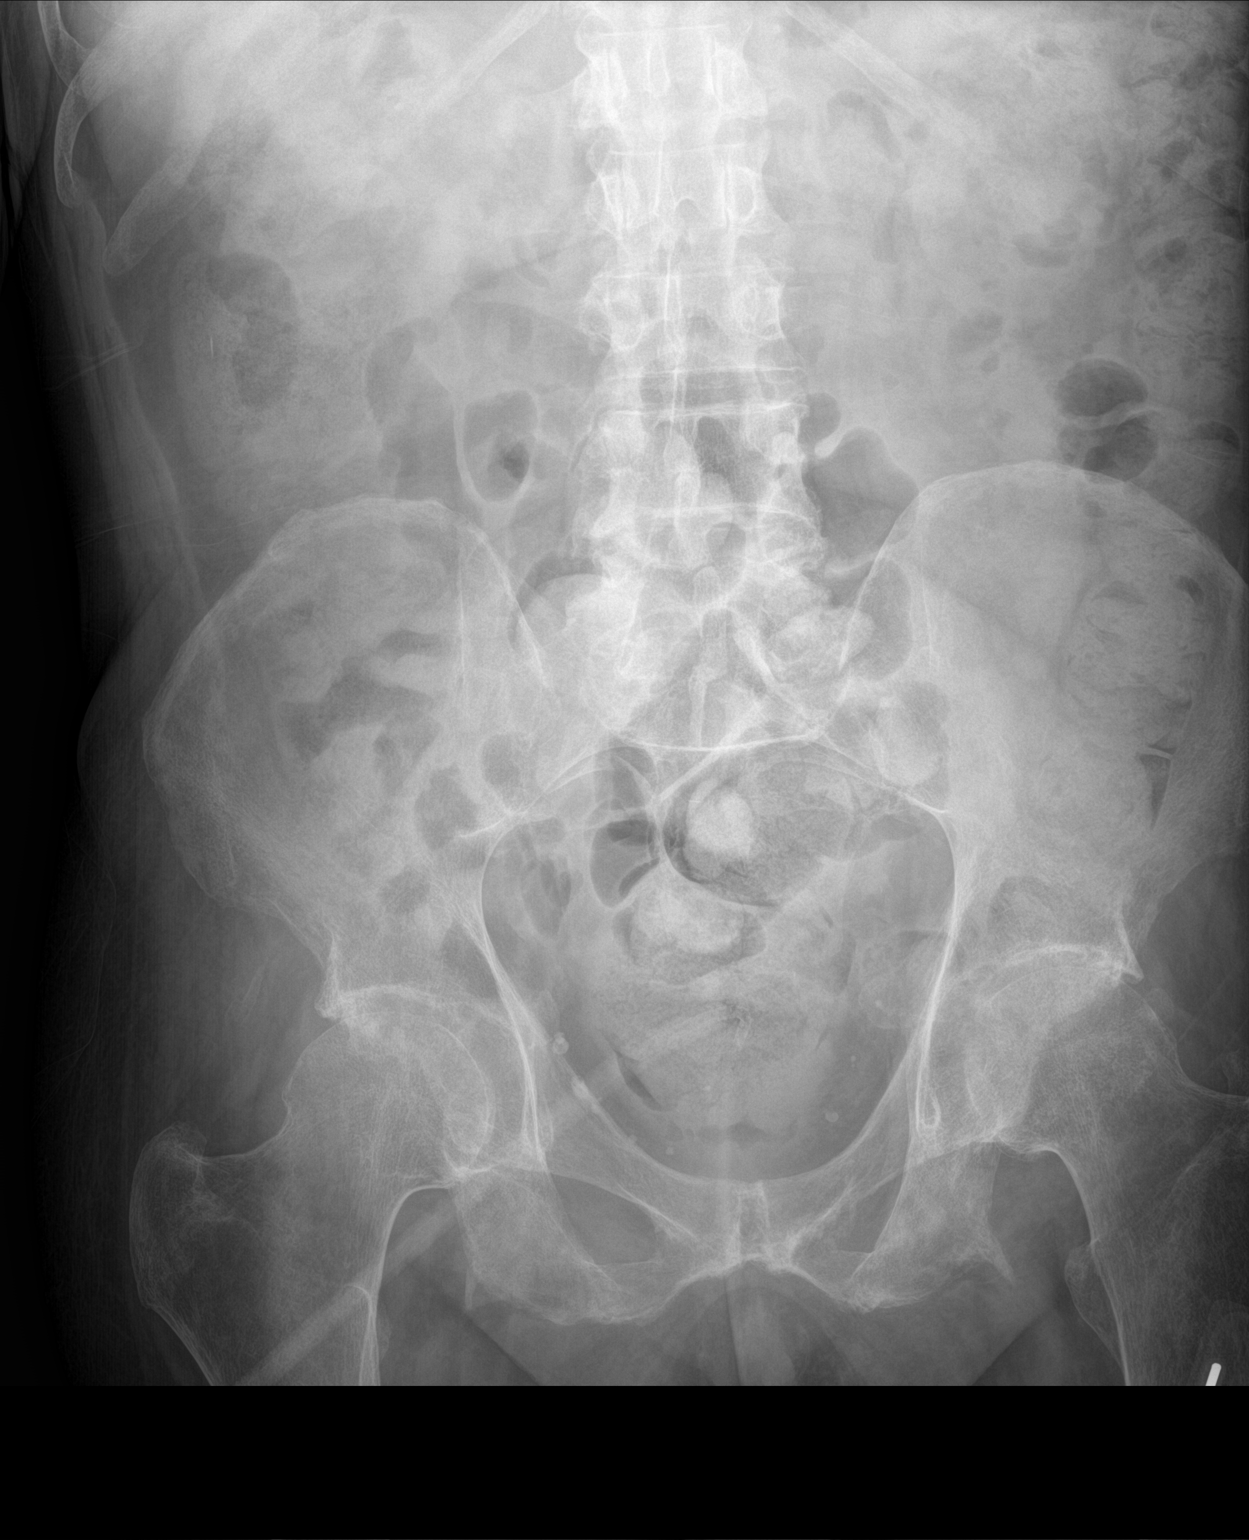

[abdomen kub (2 of 2)]
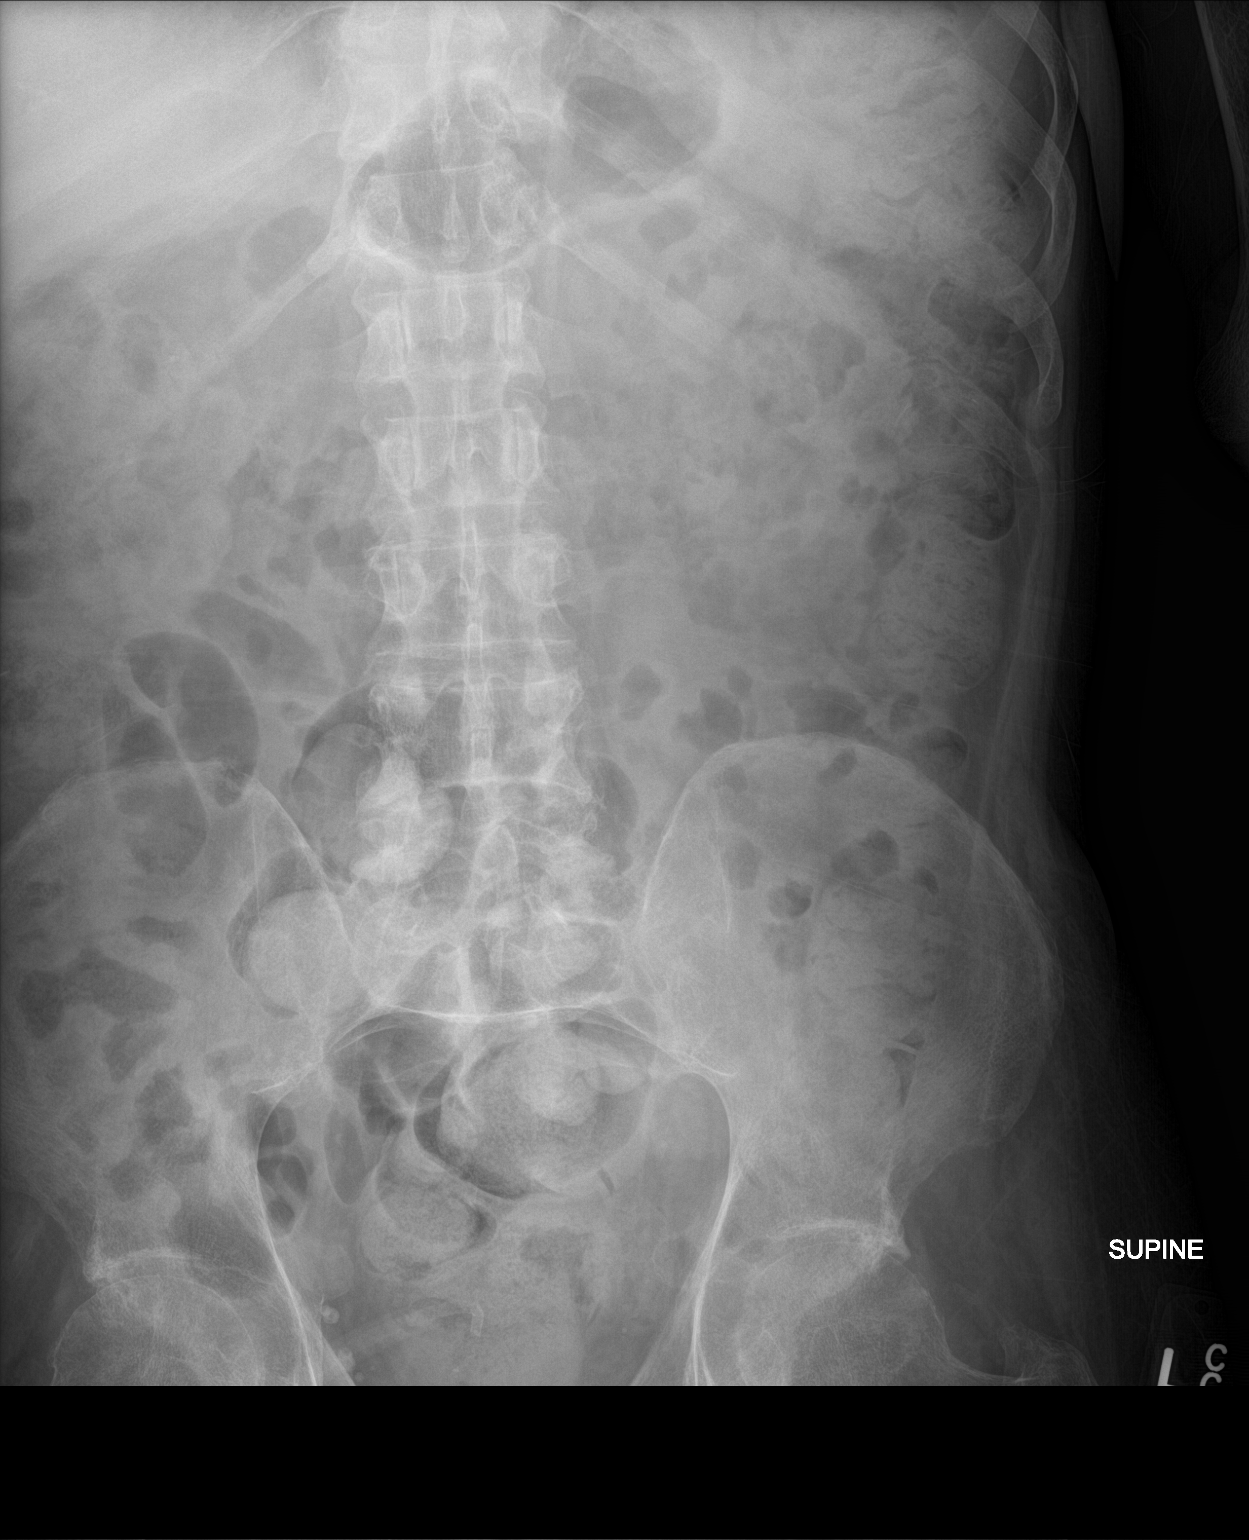

[2 of 2 positions shown; findings below may reference images not displayed]

FINDINGS: Suprapubic cystostomy as chronically seen. Few small nonobstructing
stones in the right kidney, difficult to see clearly because of
overlying fecal matter. No evidence of passing stone. Phleboliths in
the pelvis. Large amount of fecal matter throughout the colon. No
acute bone finding.
IMPRESSION: Few nonobstructing stones in the right kidney, difficult to see
because of overlying fecal matter. Chronic suprapubic bladder
catheter. Large amount of fecal matter throughout the colon.

## 2019-08-30 ENCOUNTER — Other Ambulatory Visit: Payer: Self-pay | Admitting: Neurology

## 2019-08-30 MED ORDER — DIAZEPAM 5 MG PO TABS: 5.0000 mg | ORAL_TABLET | Freq: Three times a day (TID) | ORAL | 1 refills | Status: DC

## 2019-08-30 NOTE — Telephone Encounter (Signed)
Drug registry checked last fill valium 5mg  #[id number] 05-31-19.

## 2019-08-30 NOTE — Telephone Encounter (Signed)
Pt is requesting a refill of diazepam (VALIUM) 5 MG tablet, to be sent to [id number] [place], [place] - [address]

## 2019-09-19 ENCOUNTER — Other Ambulatory Visit: Payer: Self-pay | Admitting: Neurology

## 2019-10-30 ENCOUNTER — Other Ambulatory Visit: Payer: Self-pay | Admitting: Neurology

## 2019-11-01 ENCOUNTER — Telehealth: Payer: Self-pay

## 2019-11-01 NOTE — Telephone Encounter (Signed)
Left message for patient to check in and to offer to schedule a visit with Palliative care.

## 2019-11-01 NOTE — Telephone Encounter (Signed)
Attempted to contact patient's mother. Number is no longer in service

## 2019-11-05 ENCOUNTER — Telehealth: Payer: Self-pay | Admitting: Neurology

## 2019-11-05 NOTE — Telephone Encounter (Signed)
Samuel [name],Samuel [name](sister on DPR) has called to report that on 07-02-19 she believes it was someone from [facility] that came out and diagnosed pt with PAD on his left side.  Pt sister states she has not heard from [name] or anyone re: what will or can be done for pt.  Please call her

## 2019-11-08 NOTE — Telephone Encounter (Signed)
I called sister, she received letter from [facility], had homecare visit with RN , screening done on pts legs, ;showing PAD.  I relayed that would connect with pcp for what to do next in relation to what was found on screening.  She verbalized understanding.

## 2019-12-01 ENCOUNTER — Other Ambulatory Visit: Payer: Self-pay | Admitting: Neurology

## 2019-12-02 NOTE — Telephone Encounter (Signed)
Refill last done 02/2019.

## 2019-12-06 ENCOUNTER — Telehealth: Payer: Self-pay | Admitting: Neurology

## 2019-12-06 DIAGNOSIS — G35 Multiple sclerosis: Secondary | ICD-10-CM

## 2019-12-06 NOTE — Telephone Encounter (Signed)
Pt sister donna called to inform she found someone that is able to come to the home monthly to change the pt's catheter but they are needing a form from MD filled out authorizing orders.   [facility] Phone#[phone] Fax#[phone]  ATTN:melissa hairston

## 2019-12-07 NOTE — Telephone Encounter (Signed)
I called [facility] and asked of them the time frame for care re: to f85f.  She said 90 day prior and 30 day after.

## 2019-12-07 NOTE — Telephone Encounter (Signed)
I called and LMVM for sister, [name].  This may be addressed by pcp, if need HH would need a f71f visit to address.  Last seen 06-2019.

## 2019-12-07 NOTE — Telephone Encounter (Signed)
Patient sister [name] called back in regards to missed call

## 2019-12-07 NOTE — Telephone Encounter (Signed)
It would need to be a video visit if not office visit.  SS/NP will be glad to assist.

## 2019-12-07 NOTE — Telephone Encounter (Signed)
Spoke to sister, she states she is HCPOA, will set up [facility] Mychart email account then will be able to video visit.  Needing once month catheter change (by [facility]). Sent email to set up account.

## 2019-12-08 ENCOUNTER — Encounter: Payer: Self-pay | Admitting: Neurology

## 2019-12-08 NOTE — Addendum Note (Signed)
Addended by: Glean Salvo on: 12/08/2019 08:04 AM   Modules accepted: Orders

## 2019-12-08 NOTE — Telephone Encounter (Signed)
Order placed for [facility] catheter services.

## 2019-12-08 NOTE — Telephone Encounter (Signed)
Spoke to sister, donna. Relayed that order to [facility] placed.  VV appt 12-27-19 at 1245.  She will set up mychart and appt placed.  She verbalized understanding.

## 2019-12-13 ENCOUNTER — Other Ambulatory Visit: Payer: Self-pay | Admitting: Neurology

## 2019-12-14 ENCOUNTER — Encounter: Payer: Self-pay | Admitting: Neurology

## 2019-12-16 NOTE — Telephone Encounter (Signed)
Called sister back and [facility] has came and everything has been taken care of .

## 2019-12-21 ENCOUNTER — Other Ambulatory Visit: Payer: Self-pay | Admitting: Neurology

## 2019-12-27 ENCOUNTER — Encounter: Payer: Self-pay | Admitting: Neurology

## 2019-12-27 ENCOUNTER — Telehealth (INDEPENDENT_AMBULATORY_CARE_PROVIDER_SITE_OTHER): Payer: Medicare Other | Admitting: Neurology

## 2019-12-27 DIAGNOSIS — G35 Multiple sclerosis: Secondary | ICD-10-CM

## 2019-12-27 DIAGNOSIS — N319 Neuromuscular dysfunction of bladder, unspecified: Secondary | ICD-10-CM | POA: Diagnosis not present

## 2019-12-27 NOTE — Progress Notes (Signed)
Virtual Visit via Video Note  I connected with Samuel [name] on 12/27/19 at 12:45 PM EDT by a video enabled telemedicine application and verified that I am speaking with the correct person using two identifiers.  Location: Patient: at his home  Provider: in the office    I discussed the limitations of evaluation and management by telemedicine and the availability of in person appointments. The patient expressed understanding and agreed to proceed.  History of Present Illness: 12/27/2019 SS: Samuel [name] is a 59 year old male with history of multiple sclerosis associated with spastic quadriparesis.  He remains on Ocrevus.  He has a suprapubic catheter.  The purpose of today's visit is to have home health set up for catheter changes. [facility], will be coming out once a month to change his suprapubic catheter.  He previously saw urologist in [facility] over a year ago.  He indicates his overall health has been stable.  He continues to have a gradual decline in his MS.  He is wheelchair-bound.  He has no use of his right arm or legs.  He has minimal use of the left arm.  He takes tramadol as needed before going outside the home or before catheter changes.  He says it has been 1.5 years since his last UTI.  He continues to live with his sister, [name].  He presents today for evaluation via virtual visit.  06/23/2019 SS: Samuel [name] is a 59 year old male with history of multiple sclerosis associated with a spastic quadriparesis.  He remains on Ocrevus, he receives his infusions at our office.  He indicates he is not able to discern any change, but reports overall slow progression.  He has a suprapubic catheter.  They are working with urology to establish with a home health agency who will be able to come out on a monthly basis to provide catheter care.  He remains in a wheelchair.  He has no use of the right arm or legs.  He has minimal use of the left arm. He is not able to feed himself.  His  constipation has improved with the use of daily probiotic.  He says on average he will have a bowel movement every 3 days.  He lives with his sister, [name] who is his primary caregiver.  He indicates his headaches have improved while taking Topamax.  He is planning to have several teeth extracted, but the procedure had to be pushed to January, because they didn't want it to occur too close to his Ocrevus infusion.  He does receive a [name] for the Ocrevus medication.  His next infusion is scheduled for November 23, which is a little late, to allow time for paperwork with the [employer] funding.  He is prescribed tramadol to take on days where he travels out of the home or if he has a catheter change.  It is becoming increasingly more difficult for him to be taken out of the home.  From this office he is prescribed Abilify, baclofen, Wellbutrin, Valium, Remeron, tizanidine, Topamax, and tramadol.  He presents today for follow-up accompanied by his sister.   Observations/Objective: Via virtual visit, is in wheelchair, speech is clear and concise, has no use of the right arm or legs, minimal use of the left arm.  He is quite knowledgeable, good historian.  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  Neurogenic bladder  The patient will be set up with [facility] to  provide monthly suprapubic catheter changes.  The purpose of this visit was for a face to face visit to get the process started.  It is difficult for him to attend appointments outside the home.  He will keep his regular appointment in October with Dr. Anne Hahn.  Follow Up Instructions: 06/27/2020 with Dr. Anne Hahn   I discussed the assessment and treatment plan with the patient. The patient was provided an opportunity to ask questions and all were answered. The patient agreed with the plan and demonstrated an understanding of the instructions.   The patient was advised to call back or seek  an in-person evaluation if the symptoms worsen or if the condition fails to improve as anticipated.  I spent 20 minutes of face-to-face and non-face-to-face time with patient.  This included previsit chart review, lab review, study review, order entry, electronic health record documentation, patient education.   Otila Kluver, DNP  [facility] [address] [place], [place] [address] [phone]

## 2019-12-27 NOTE — Progress Notes (Signed)
I have read the note, and I agree with the clinical assessment and plan.  Christiano Blandon K Germani Gavilanes   

## 2020-01-19 ENCOUNTER — Other Ambulatory Visit: Payer: Self-pay | Admitting: *Deleted

## 2020-01-19 DIAGNOSIS — I739 Peripheral vascular disease, unspecified: Secondary | ICD-10-CM

## 2020-01-27 ENCOUNTER — Telehealth (HOSPITAL_COMMUNITY): Payer: Self-pay

## 2020-01-27 NOTE — Telephone Encounter (Signed)

## 2020-01-28 ENCOUNTER — Encounter: Payer: Self-pay | Admitting: Vascular Surgery

## 2020-01-28 ENCOUNTER — Other Ambulatory Visit: Payer: Self-pay

## 2020-01-28 ENCOUNTER — Ambulatory Visit: Payer: Medicare Other | Admitting: Vascular Surgery

## 2020-01-28 ENCOUNTER — Ambulatory Visit (HOSPITAL_COMMUNITY)
Admission: RE | Admit: 2020-01-28 | Discharge: 2020-01-28 | Disposition: A | Discharge status: Home / Self Care | Payer: Medicare Other | Source: Ambulatory Visit | Attending: Vascular Surgery | Admitting: Vascular Surgery

## 2020-01-28 VITALS — BP 129/87 | HR 98 | Temp 98.1°F | Resp 20

## 2020-01-28 DIAGNOSIS — I739 Peripheral vascular disease, unspecified: Secondary | ICD-10-CM | POA: Insufficient documentation

## 2020-01-28 DIAGNOSIS — M25561 Pain in right knee: Secondary | ICD-10-CM

## 2020-01-28 DIAGNOSIS — M25562 Pain in left knee: Secondary | ICD-10-CM

## 2020-01-28 NOTE — Progress Notes (Signed)
Patient ID: Samuel [name], male   DOB: 02-09-1961, 59 y.o.   MRN: [id number]  Reason for Consult: No chief complaint on file.   Referred by Clovia Cuff, MD  Subjective:     HPI:  Samuel [name] is a 59 y.o. male has a history of multiple sclerosis with spastic quadriparesis.  Patient only has use of his left upper extremity.  He does have a physician coming up to see him.  Does have an indwelling Foley catheter.  Has bilateral foot drop cannot use either of his legs and is chronically wheelchair-bound.  He is sent here for evaluation of peripheral vascular disease.  Does not have any tissue loss or ulceration of his legs.  Does have purple discoloration of his skin when they are dependent.  He is able to usually keep his legs elevated with the help of family.  Overall there are no current vascular complaints today.  Past Medical History:  Diagnosis Date  . Anxiety   . Bladder calculi   . Chronic back pain   . Chronic fatigue   . Depression   . Foley catheter in place   . Gait disorder   . GERD (gastroesophageal reflux disease)   . History of kidney stones   . Hyperlipidemia   . Hypertension   . Hypothyroidism   . Incontinence of urine   . Multiple sclerosis ([place]) NEUROLOGIST-- DR Jannifer Franklin   dx 1993, JC virus negative 2/14---  CURRENTLY RECEIVING TYSABRI IV TREATMENT  . Neurogenic bladder   . Neurogenic bowel    intermittant constipation and diarrhea  . Pneumonia    dx  03-03-2014--  admitted for iv and oral antibiotics  . Spastic quadriparesis ([place]) 04/19/2013  . Urinary retention   . Vitamin D deficiency   . Yeast infection    genital area   Family History  Problem Relation Age of Onset  . Stroke Father   . Cirrhosis Brother   . Hypothyroidism Sister    Past Surgical History:  Procedure Laterality Date  . APPENDECTOMY  age 57  . CYSTOSCOPY N/A 03/21/2014   Procedure: CYSTOSCOPY TREATMENT OF BLADDER CALCULI;  Surgeon: Bernestine Amass, MD;  Location: [facility];  Service: Urology;  Laterality: N/A;  . CYSTOSCOPY WITH BIOPSY N/A 12/29/2017   Procedure: TRANSURETHRAL RESECTION OF BLADDER TUMOR;  Surgeon: Lucas Mallow, MD;  Location: [facility];  Service: Urology;  Laterality: N/A;  . CYSTOSCOPY WITH RETROGRADE PYELOGRAM, URETEROSCOPY AND STENT PLACEMENT Right 12/11/2017   Procedure: CYSTOSCOPY WITH RETROGRADE PYELOGRAM, URETEROSCOPY AND STENT PLACEMENT RIGHT;  Surgeon: Festus Aloe, MD;  Location: [facility];  Service: Urology;  Laterality: Right;  . CYSTOSCOPY WITH RETROGRADE PYELOGRAM, URETEROSCOPY AND STENT PLACEMENT Right 12/29/2017   Procedure: CYSTOSCOPY WITH RIGHT  RETROGRADE PYELOGRAM, URETEROSCOPY/LASER LITHOTRIPSY AND STENT PLACEMENT;  Surgeon: Lucas Mallow, MD;  Location: [facility];  Service: Urology;  Laterality: Right;  . INSERTION OF SUPRAPUBIC CATHETER N/A 03/21/2014   Procedure: INSERTION OF SUPRAPUBIC CATHETER;  Surgeon: Bernestine Amass, MD;  Location: [facility];  Service: Urology;  Laterality: N/A;  . NASAL SEPTUM SURGERY  1996  . NEGATIVE SLEEP STUDY  1996  . TRANSTHORACIC ECHOCARDIOGRAM  02-07-2004   MILD LVH/  EF 55-65%    Short Social History:  Social History   Tobacco Use  . Smoking status: Former Smoker    Packs/day: 1.50    Years: 8.00    Pack years: 12.00    Types:  Cigarettes    Quit date: 06/17/1985    Years since quitting: 34.6  . Smokeless tobacco: Never Used  Substance Use Topics  . Alcohol use: No    Alcohol/week: 0.0 standard drinks    Allergies  Allergen Reactions  . Penicillins Other (See Comments)    > 20 yr ago, doesn't recall nature of reaction (didn't require hospitalization) Tolerates Ancef, Rocephin, and Cefepime per chart review Does not recall having taken amox/amp since initial rxn; none documented in Epic   . Primaxin [Imipenem] Other (See Comments)    Seizure     Current Outpatient Medications  Medication Sig Dispense Refill  . acetic acid 0.25 %  irrigation Irrigate with as directed daily. Instill 34ml and clamp tube for 30 minutes then drain. 500 mL 12  . ARIPiprazole (ABILIFY) 5 MG tablet TAKE 1 TABLET BY MOUTH EVERY DAY 90 tablet 1  . AZO-CRANBERRY PO Take 1 tablet by mouth 2 (two) times daily.    . baclofen (LIORESAL) 20 MG tablet Take 2 tablets (40 mg total) by mouth 3 (three) times daily. 540 each 2  . buPROPion (WELLBUTRIN XL) 150 MG 24 hr tablet TAKE 2 TABLETS BY MOUTH EVERY DAY 180 tablet 1  . clotrimazole-betamethasone (LOTRISONE) cream Apply 1 application topically 2 (two) times daily.    . diazepam (VALIUM) 5 MG tablet Take 1 tablet (5 mg total) by mouth 3 (three) times daily. 270 tablet 1  . Hydrocortisone (GERHARDT'S BUTT CREAM) CREA Apply 1 application topically 3 (three) times daily. 1 each 0  . levothyroxine (SYNTHROID, LEVOTHROID) 125 MCG tablet Take 125 mcg by mouth daily.    Marland Kitchen lisinopril-hydrochlorothiazide (PRINZIDE,ZESTORETIC) 10-12.5 MG tablet Take 0.5 tablets by mouth daily.    Marland Kitchen liver oil-zinc oxide (DESITIN) 40 % ointment Apply 1 application topically as needed for irritation.    . magnesium citrate SOLN Take 0.5 Bottles by mouth daily as needed for moderate constipation or severe constipation.    . mirtazapine (REMERON) 30 MG tablet TAKE 1 TABLET BY MOUTH AT BEDTIME 90 tablet 1  . MYRBETRIQ 50 MG TB24 tablet     . omeprazole (PRILOSEC) 20 MG capsule Take 20 mg by mouth every morning.     . polyethylene glycol (MIRALAX / GLYCOLAX) packet Take 17 g by mouth daily. (Patient taking differently: Take 17 g by mouth as needed. ) 14 each 0  . Probiotic Product (PROBIOTIC-10 PO) Take by mouth daily.    Marland Kitchen tiZANidine (ZANAFLEX) 4 MG tablet TAKE 2 TABLETS BY MOUTH 3 TIMES A DAY 540 tablet 1  . topiramate (TOPAMAX) 25 MG tablet TAKE 2 TABLETS BY MOUTH AT BEDTIME 180 tablet 1  . traMADol (ULTRAM) 50 MG tablet TAKE 1 TABLET BY MOUTH EVERY 6 HOURS AS NEEDED 60 tablet 1  . Vitamin D, Ergocalciferol, (DRISDOL) 50000 UNITS CAPS  Take 50,000 Units by mouth every 7 (seven) days. Friday only    . Vitamins A & D (VITAMIN A & D) ointment Apply 1 application topically as needed for dry skin.     Current Facility-Administered Medications  Medication Dose Route Frequency Provider Last Rate Last Admin  . botulinum toxin Type A (BOTOX) injection 300 Units  300 Units Intramuscular Once Levert Feinstein, MD        Review of Systems  Constitutional:  Constitutional negative. HENT: HENT negative.  Eyes: Eyes negative.  Respiratory: Respiratory negative.  Cardiovascular: Positive for leg swelling.  GI: Gastrointestinal negative.  Musculoskeletal: Musculoskeletal negative.  Skin:  Skin flaking Neurological:       Patient only has minimal use of left upper extremity no use of right upper extremity or bilateral lower extremities Hematologic: Hematologic/lymphatic negative.  Psychiatric: Psychiatric negative.        Objective:  Objective  Vitals:   01/28/20 1040  BP: 129/87  Pulse: 98  Resp: 20  Temp: 98.1 F (36.7 C)  SpO2: 94%    Physical Exam HENT:     Head: Normocephalic.     Nose:     Comments: Mask in place Eyes:     Pupils: Pupils are equal, round, and reactive to light.  Cardiovascular:     Pulses:          Popliteal pulses are 2+ on the right side and 2+ on the left side.       Dorsalis pedis pulses are 2+ on the right side.       Posterior tibial pulses are 2+ on the right side.  Pulmonary:     Effort: Pulmonary effort is normal.  Abdominal:     General: Abdomen is flat.     Palpations: Abdomen is soft.  Musculoskeletal:        General: Normal range of motion.  Skin:    General: Skin is warm.     Capillary Refill: Capillary refill takes less than 2 seconds.  Neurological:     General: No focal deficit present.     Mental Status: He is alert.  Psychiatric:        Mood and Affect: Mood normal.        Thought Content: Thought content normal.        Judgment: Judgment normal.      Data: I have independently interpreted his ABIs to be 1.1 right and 0.99 left toe pressure 172 on the right and 142 on the left.     Assessment/Plan:     59 year old male with multiple sclerosis confined to wheelchair with bilateral lower extremity foot drop.  His ABIs are preserved bilaterally and at least has palpable pulses on the right but is difficult to evaluate on the left given spasticity.  Either way he has brisk capillary refill.  There are no wounds on his feet.  Family and friends have done a great job protecting his lower extremities at this time he can follow-up as needed.     Maeola Harman MD [facility]

## 2020-02-01 ENCOUNTER — Telehealth: Payer: Self-pay

## 2020-02-01 NOTE — Telephone Encounter (Signed)
If pt or sister of family call backs on dpr. Please give them the following message for his eligibility  of ocrevus.   I left voice mail for patients sister Audry Riles at [phone] that a fax was sent to [place] that that [employer] pt foundation is unable to get in contact with patient. They need to determine ongoing eligibility for his ocrevus with his insurance and household income. I left phone number for foundation of [phone]. I advise them on voice mail to please have pt call them.   I also sent a mychart message to pts chart to contact foundation.

## 2020-02-02 ENCOUNTER — Other Ambulatory Visit: Payer: Self-pay | Admitting: Neurology

## 2020-02-04 ENCOUNTER — Encounter: Payer: Self-pay | Admitting: Neurology

## 2020-03-14 ENCOUNTER — Telehealth: Payer: Self-pay

## 2020-03-14 NOTE — Telephone Encounter (Signed)
I called Camera operator. Pt's last ocrevus infusion was 02/01/2020 and his next is 08/03/2020.

## 2020-03-16 ENCOUNTER — Other Ambulatory Visit: Payer: Self-pay | Admitting: *Deleted

## 2020-03-16 MED ORDER — DIAZEPAM 5 MG PO TABS: 5.0000 mg | ORAL_TABLET | Freq: Three times a day (TID) | ORAL | 1 refills | Status: DC

## 2020-03-16 NOTE — Progress Notes (Signed)
I have filled his Valium.

## 2020-04-01 ENCOUNTER — Encounter: Payer: Self-pay | Admitting: Neurology

## 2020-04-11 ENCOUNTER — Other Ambulatory Visit: Payer: Self-pay | Admitting: Neurology

## 2020-04-23 ENCOUNTER — Other Ambulatory Visit: Payer: Self-pay | Admitting: Neurology

## 2020-05-23 ENCOUNTER — Encounter: Payer: Self-pay | Admitting: Neurology

## 2020-06-15 ENCOUNTER — Telehealth: Payer: Self-pay

## 2020-06-15 NOTE — Telephone Encounter (Signed)
Volunteer check in call made for palliative care: Doing well 

## 2020-06-19 ENCOUNTER — Other Ambulatory Visit: Payer: Self-pay | Admitting: Neurology

## 2020-06-23 ENCOUNTER — Telehealth: Payer: Self-pay | Admitting: Neurology

## 2020-06-23 NOTE — Telephone Encounter (Signed)
..   Pt understands that although there may be some limitations with this type of visit, we will take all precautions to reduce any security or privacy concerns.  Pt understands that this will be treated like an in office visit and we will file with pt's insurance, and there may be a patient responsible charge related to this service. ? ?

## 2020-06-26 NOTE — Telephone Encounter (Signed)
Noted  

## 2020-06-27 ENCOUNTER — Telehealth (INDEPENDENT_AMBULATORY_CARE_PROVIDER_SITE_OTHER): Payer: Medicare Other | Admitting: Neurology

## 2020-06-27 ENCOUNTER — Encounter: Payer: Self-pay | Admitting: Neurology

## 2020-06-27 DIAGNOSIS — N319 Neuromuscular dysfunction of bladder, unspecified: Secondary | ICD-10-CM | POA: Diagnosis not present

## 2020-06-27 DIAGNOSIS — G35 Multiple sclerosis: Secondary | ICD-10-CM

## 2020-06-27 DIAGNOSIS — Z5181 Encounter for therapeutic drug level monitoring: Secondary | ICD-10-CM

## 2020-06-27 NOTE — Progress Notes (Signed)
        Virtual Visit via Video Note  I connected with Samuel [name] on 06/27/20 at 11:00 AM EDT by a video enabled telemedicine application and verified that I am speaking with the correct person using two identifiers.  Location: Patient: The patient is at home. Provider: The physician is in office.   I discussed the limitations of evaluation and management by telemedicine and the availability of in person appointments. The patient expressed understanding and agreed to proceed.  History of Present Illness: Samuel [name] is a 59 year old white male with a history of multiple sclerosis with spastic quadriparesis.  The patient has essentially no use of his right arm or either lower extremity.  He can flex the left wrist and move the left index finger and elevate the left arm about 12 inches off the bed.  Even on the Ocrevus he has noted a slow gradual progression of his clinical deficits.  He is dependent on all activities of daily living including feeding.  He is reporting no skin breakdown issues or pain problems.  He has a suprapubic catheter, he drinks a lot of fluid during the day.  He recently had dental extractions and will be going to the dentist for fillings in January 2022.  His next Ocrevus infusion through [facility] will be in December 2021.  He has not had recent blood work done.   Observations/Objective: Clinical evaluation reveals the patient is alert and cooperative.  Speech is slow and monosyllabic.  Extraocular movements were associated with bilateral INO, more prominent on the left.  He has a symmetric face, he can protrude the tongue midline and has good lateral movement of the tongue.  He has minimal movement of the left wrist and hand, otherwise no significant voluntary control of the right arm or the legs.  He is bedridden.  Assessment and Plan: 1.  Multiple sclerosis  2.  Spastic quadriparesis  The patient will remain on Ocrevus, I will try to get [facility] to  check blood work on him on their next visit.  We will follow-up with him in about 6 months.  Follow Up Instructions: 21-month follow-up, okay for virtual visit.  May see nurse practitioner.   I discussed the assessment and treatment plan with the patient. The patient was provided an opportunity to ask questions and all were answered. The patient agreed with the plan and demonstrated an understanding of the instructions.   The patient was advised to call back or seek an in-person evaluation if the symptoms worsen or if the condition fails to improve as anticipated.  I provided 20 minutes of non-face-to-face time during this encounter.   York Spaniel, MD

## 2020-06-28 ENCOUNTER — Telehealth: Payer: Self-pay | Admitting: Emergency Medicine

## 2020-06-28 NOTE — Telephone Encounter (Signed)
Called [facility] at [facility], gave verbal order for labs to be drawn on patient.  CBC w/diff, CMP, IgG, IgM, IgA.  Gave VO to [place], RN @ [facility].  Stated she would fax results back to [phone].

## 2020-07-03 ENCOUNTER — Other Ambulatory Visit: Payer: Self-pay | Admitting: Emergency Medicine

## 2020-07-03 ENCOUNTER — Encounter: Payer: Self-pay | Admitting: Neurology

## 2020-07-03 MED ORDER — TRAMADOL HCL 50 MG PO TABS: 50.0000 mg | ORAL_TABLET | Freq: Four times a day (QID) | ORAL | 0 refills | Status: DC | PRN

## 2020-07-03 NOTE — Addendum Note (Signed)
Addended by: Huston Foley on: 07/03/2020 01:15 PM   Modules accepted: Orders

## 2020-07-03 NOTE — Telephone Encounter (Signed)
[facility]

## 2020-07-03 NOTE — Telephone Encounter (Signed)
Received prescription request for [facility] from [facility] ([facility] from [facility]).  Spoke to patient who said he had extensive dental work done and they told dentist he had pain medication at home, however, he has ran out and sister stated they would like to have the prescription refilled so he can have some on hand when he travels as well as he is bedridden.    Checked [place] [facility]. Last filled 12/14/19, 28 tabs. Prescription 12/14/19 for 60 tabs w/1 refill.   Did not use refill and prescription has expired.

## 2020-07-10 ENCOUNTER — Telehealth: Payer: Self-pay | Admitting: Neurology

## 2020-07-10 NOTE — Telephone Encounter (Signed)
Blood work was done on 05 July 2020.  CBC with a white blood count of 9.3, hemoglobin 15.1, hematocrit 45.7, MCV of 86.2, platelets of 297.  Absolute lymphocyte count of 2.2.  IgM level was low at 34, range is 70-210.  IgG is normal at 658.  IgA is normal at 122.  Sodium is 137, potassium 4.2, chloride 103, CO2 is 21, BUN of 15, glucose 65, creatinine of 0.45, calcium 9.5, total protein 6.8, albumin 4.4, alkaline phosphatase is slightly elevated at 142, AST of 23, ALT of 31.  Given the slightly low IgM level, we may need to space out the infusions of Ocrevus to every 8 months rather than every 6 months and follow IgM levels over time.   I called the patient.  The blood work shows a slightly low IgM level, need to delay the dose of Ocrevus by couple months, do the infusions every 8 months rather than every 6 months.  If the levels remain low, we will use the dosing to 300 mg rather than 600.  The infusion nurse, Stacy at [facility] was called, left a message to delay that dose from December until February.  She is to call our office if there are questions.  The number for Stacy at [facility] pharmacy is [phone].

## 2020-08-01 ENCOUNTER — Other Ambulatory Visit: Payer: Self-pay | Admitting: Neurology

## 2020-08-09 ENCOUNTER — Telehealth: Payer: Self-pay

## 2020-08-09 NOTE — Telephone Encounter (Signed)
Received message from patient's sister that she would like to schedule a follow up with Palliative NP. Phone call placed to patient's sister, [name], [place] left. Also, sent message via my chart.

## 2020-08-16 ENCOUNTER — Other Ambulatory Visit: Payer: Medicare Other | Admitting: Nurse Practitioner

## 2020-08-16 ENCOUNTER — Other Ambulatory Visit: Payer: Self-pay

## 2020-08-16 DIAGNOSIS — Z515 Encounter for palliative care: Secondary | ICD-10-CM

## 2020-08-16 DIAGNOSIS — G35 Multiple sclerosis: Secondary | ICD-10-CM

## 2020-08-16 NOTE — Progress Notes (Addendum)
[facility] Consult Note Telephone: [phone]  Fax: [phone]  PATIENT NAME: Samuel [name] [place] [address] [phone] (home)  DOB: 05-21-1961 MRN: [id number]  PRIMARY CARE PROVIDER:    Clovia Cuff, MD,  [address] [place] [address] [phone]  REFERRING PROVIDER:   Clovia Cuff, MD [address] [place],  [place] [address] [phone]  RESPONSIBLE PARTY:   Extended Emergency Contact Information Primary Emergency Contact: Schatz,Elaine Address: [place], [place] [address] [place] Phone: [phone] Relation: Mother Secondary Emergency Contact: Linus Mako Address: [place], [place] [address] [place] Phone: [phone] Work Phone: [phone] Mobile Phone: [phone] Relation: Sister  I met face to face with patient and family in home.   ASSESSMENT AND RECOMMENDATIONS:   Advance Care Planning: Goal of care: Goal of care is comfort. Patient desires for comfort and at the same time wants everything done to treat or reverse any reversible condition. Directives: Patient's code status is DNR. He reiterated desire to not be resuscitated in the event of cardiac or respiratory arrest. Patient report misplacing his DNR form, he requested for a new one. A new DNR form was signed and given to patient's sister. Patient does not have a signed MOST form. The need to complete a MOST form was discussed, patient expressed interest in completing a MOST form. Discussed and reviewed sections of the form in detail, opportunity for questions given, all questions answered. Form completed and signed with patient's sister [name] who is his HCPOA. Signed MOST form given to [name] to keep, copy of MOST and DNR form uploaded to [facility] EMR. Details of MOST include, DNR/no CPR, Comfort measures, antibiotics  if indicated, IVF if indicated, feeding tube for long term.  Symptom Management:  Multiple Sclerosis with spastic quadriparesis: Patient followed by [facility]. Patient report symptoms control with Baclofen 32m TID and Zanaflex TID. He is on Ocrevus. Recommend continuing current regimen. Patient with Neurogenic bladder/urinary retention with history of recurrent UTIs. Patient report no UTI in the last 2 years, patient artibuted this success to his increased fluid intake report drinking about 6 of 5026mbottle of water a day. No report of fever, chills, or abdominal pain.  Chronic constipation: Patient report chronic constipation, report having bowel movement once a week to once every weak and half. He report taking Miralax, sennokot, ducosate, mag citrate without improvement. Patient denied abdominal pain, denied nausea or vomiting, has active bowel sounds, no abdominal distention. Family report patient moves from diarrhea to constipation, report patient stable on current regimen.  Fungal rash of buttocks: patient with history of fungal rash to buttocks. Family report skin improvement with application of  Clotrmazole and Betamethasone cream. Family asked for reorder of cream to have at hand. Prescriptions sent to patient's pharmacy today.  Addendum  Patient's family asked for a different air mattress as the one patient currently has (egg crate kind) slides down the bed, requiring patient to be repositioned frequently. Family requested for" Medical MedAir Low Air Loss Mattress Replacement System with Alarm, 8" with Quilted Cover Fully Digital with Remote Control". Will communicate request to his PCP Dr. AsDaphene Jaeger Follow up Palliative Care Visit: Palliative care will continue to follow for goals of care clarification and symptom management. Return in about 4-6 weeks or prn.  Family /Caregiver/Community Supports: Patient lives with her sister [name] and her family, his mother lives in the same home.  His sister is his main caregiver. He has paid care givers for 3 hours a day 6 days a week.  Cognitive / Functional decline: Patient awake and alert, coherent, able to make his own decisions. He is bed bound, only able to move his left arm and head, limited movement to his left arm. His is totally dependent for all his ADLs including feeding.  I spent 60 minutes providing this consultation, time includes time spent with patient and family, chart review, and documentation. More than 50% of the time in this consultation was spent counseling and coordinating communication.   CHIEF COMPLAINT: Constipation  History obtained from review of EMR, discussion with primary team, and interview with family and patient. Records reviewed and summarized bellow.   HISTORY OF PRESENT ILLNESS:  Samuel [name] is a 59 y.o. 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 Palliative Care was asked to help with symptom management and to address goals of care. This is a follow up visit from 09/30/2018  CODE STATUS: DNR  PPS: 30%  HOSPICE ELIGIBILITY/DIAGNOSIS: TBD  PHYSICAL EXAM / ROS:   Current and past weights: No recent weight reported General: NAD, frail appearing, thin, lying in bed Cardiovascular: no chest pain reported, no edema  Pulmonary: no cough, appeared dyspneic, oxygen saturation 95% on room air GI: no swallowing issues reported, appetite fair, endorses constipation, continent of bowel GU: denies dysuria, chronic suprapubic cath, urine clear and yellow MSK:  Generalized muscle artrophy/ wasting, bilateral foot drop, right arm contracture Skin: no rashes or wounds reported Neurological: Weakness, but otherwise nonfocal Psych: non-anxious affect  PAST MEDICAL HISTORY:  Past Medical  History:  Diagnosis Date  . Anxiety   . Bladder calculi   . Chronic back pain   . Chronic fatigue   . Depression   . Foley catheter in place   . Gait disorder   . GERD (gastroesophageal reflux disease)   . History of kidney stones   . Hyperlipidemia   . Hypertension   . Hypothyroidism   . Incontinence of urine   . Multiple sclerosis ([place]) NEUROLOGIST-- DR Jannifer Franklin   dx 1993, JC virus negative 2/14---  CURRENTLY RECEIVING TYSABRI IV TREATMENT  . Neurogenic bladder   . Neurogenic bowel    intermittant constipation and diarrhea  . Pneumonia    dx  03-03-2014--  admitted for iv and oral antibiotics  . Spastic quadriparesis ([place]) 04/19/2013  . Urinary retention   . Vitamin D deficiency   . Yeast infection    genital area    SOCIAL HX:  Social History   Tobacco Use  . Smoking status: Former Smoker    Packs/day: 1.50    Years: 8.00    Pack years: 12.00    Types: Cigarettes    Quit date: 06/17/1985    Years since quitting: 35.1  . Smokeless tobacco: Never Used  Substance Use Topics  . Alcohol use: No    Alcohol/week: 0.0 standard drinks   FAMILY HX:  Family History  Problem Relation Age of Onset  . Stroke Father   . Cirrhosis Brother   . Hypothyroidism Sister     ALLERGIES:  Allergies  Allergen Reactions  . Penicillins Other (See Comments)    >  20 yr ago, doesn't recall nature of reaction (didn't require hospitalization) Tolerates Ancef, Rocephin, and Cefepime per chart review Does not recall having taken amox/amp since initial rxn; none documented in Epic   . Primaxin [Imipenem] Other (See Comments)    Seizure      PERTINENT MEDICATIONS:  Outpatient Encounter Medications as of 08/16/2020  Medication Sig  . acetic acid 0.25 % irrigation Irrigate with as directed daily. Instill 71m and clamp tube for 30 minutes then drain.  . ARIPiprazole (ABILIFY) 5 MG tablet TAKE 1 TABLET BY MOUTH EVERY DAY  . AZO-CRANBERRY PO Take 1 tablet by mouth 2 (two) times daily.  .  baclofen (LIORESAL) 20 MG tablet TAKE 2 TABLETS (40 MG TOTAL) BY MOUTH 3 (THREE) TIMES DAILY.  .Marland KitchenbuPROPion (WELLBUTRIN XL) 150 MG 24 hr tablet TAKE 2 TABLETS BY MOUTH EVERY DAY  . clotrimazole-betamethasone (LOTRISONE) cream Apply 1 application topically 2 (two) times daily.  . diazepam (VALIUM) 5 MG tablet Take 1 tablet (5 mg total) by mouth 3 (three) times daily.  . Hydrocortisone (GERHARDT'S BUTT CREAM) CREA Apply 1 application topically 3 (three) times daily.  .Marland Kitchenlevothyroxine (SYNTHROID, LEVOTHROID) 125 MCG tablet Take 125 mcg by mouth daily.  .Marland Kitchenlisinopril-hydrochlorothiazide (PRINZIDE,ZESTORETIC) 10-12.5 MG tablet Take 0.5 tablets by mouth daily.  .Marland Kitchenliver oil-zinc oxide (DESITIN) 40 % ointment Apply 1 application topically as needed for irritation.  . magnesium citrate SOLN Take 0.5 Bottles by mouth daily as needed for moderate constipation or severe constipation.  . mirtazapine (REMERON) 30 MG tablet TAKE 1 TABLET BY MOUTH AT BEDTIME  . MYRBETRIQ 50 MG TB24 tablet   . omeprazole (PRILOSEC) 20 MG capsule Take 20 mg by mouth every morning.   . polyethylene glycol (MIRALAX / GLYCOLAX) packet Take 17 g by mouth daily. (Patient taking differently: Take 17 g by mouth as needed. )  . Probiotic Product (PROBIOTIC-10 PO) Take by mouth daily.  .Marland KitchentiZANidine (ZANAFLEX) 4 MG tablet TAKE 2 TABLETS BY MOUTH 3 TIMES A DAY  . topiramate (TOPAMAX) 25 MG tablet TAKE 2 TABLETS BY MOUTH AT BEDTIME  . traMADol (ULTRAM) 50 MG tablet Take 1 tablet (50 mg total) by mouth every 6 (six) hours as needed.  . Vitamin D, Ergocalciferol, (DRISDOL) 50000 UNITS CAPS Take 50,000 Units by mouth every 7 (seven) days. Friday only  . Vitamins A & D (VITAMIN A & D) ointment Apply 1 application topically as needed for dry skin.   Facility-Administered Encounter Medications as of 08/16/2020  Medication  . botulinum toxin Type A (BOTOX) injection 300 Units     Thank you for the opportunity to participate in the care of@. The  palliative care team will continue to follow. Please call our office at [phone] we can be of additional assistance.  QAlba Destine NP , DNP, AGPCNP-BC

## 2020-09-07 ENCOUNTER — Encounter: Payer: Self-pay | Admitting: Neurology

## 2020-09-13 ENCOUNTER — Telehealth: Payer: Self-pay

## 2020-09-13 NOTE — Telephone Encounter (Signed)
Volunteer called patient on behalf of Palliative Care. Patient is doing okay at this time and managing symptoms.

## 2020-10-03 ENCOUNTER — Other Ambulatory Visit: Payer: Self-pay

## 2020-10-03 ENCOUNTER — Other Ambulatory Visit: Payer: Medicare Other | Admitting: Nurse Practitioner

## 2020-10-03 DIAGNOSIS — G35 Multiple sclerosis: Secondary | ICD-10-CM

## 2020-10-03 DIAGNOSIS — Z515 Encounter for palliative care: Secondary | ICD-10-CM

## 2020-10-03 NOTE — Progress Notes (Signed)
[facility] Consult Note Telephone: [phone]  Fax: [phone]  PATIENT NAME: Samuel [name] [place] [address] [phone] (home)  DOB: August 09, 1961 MRN: [id number]  PRIMARY CARE PROVIDER:    Clovia Cuff, MD,  [address] [place] [address] [phone]  REFERRING PROVIDER:   Clovia Cuff, MD [address] [place],  [place] [address] [phone]  RESPONSIBLE PARTY:   Extended Emergency Contact Information Primary Emergency Contact: Sansone,Elaine Address: [place], [place] [address] [place] Phone: [phone] Relation: Mother Secondary Emergency Contact: Linus Mako Address: [place], [place] [address] [place] Phone: [phone] Work Phone: [phone] Mobile Phone: [phone] Relation: Sister  I met face to face with patient and family in home.   ASSESSMENT AND RECOMMENDATIONS:   Advance Care Planning: Goal of care is comfort. Patient desires for comfort.  Signed DNR and MOST form in home, copy on [facility] Epic EMR. Directives: Patient's code status is DNR. He reiterated desire to not be resuscitated   Cognitive / Functional decline:Symptom Management:  Patient awake and alert, coherent, able to make his own decisions. He is bed bound related to progression of MS. Generalized paralysis ongoing, only able to move his left arm and head, limited movement to his left arm. His is totally dependent for all his ADLs including feeding. Stable respiratory status, no acute cough or increased dyspnea. Patient with chronic constipation, report improvement in symptoms, report having bowel movement 1-2 times a week in the last month, not on any bowel regimen at this time, report increasing his fluid intake. Chronic suprapubic cath was last changed last week. He denied pain, denied fever, denied chills,  report feeling well. Was seen by a provider from his PCP office yesterday with no change in plan of care. Report having dentist appointment tomorrow, he is s/p 5 wisdom teeth extraction, going for routine check. Report he will be transported via a handicapped equipped [employer] which he rents. Family report insurance does not cover transportation for routine doctor visits. Questions and concerns were addressed. The patient/family was encouraged to call with questions and/or concerns. Provided general support and encouragement, no other unmet needs identified.  Follow up Palliative Care Visit: Palliative care will continue to follow for goals of care clarification and symptom management. Return in about 6 weeks or prn.  Family /Caregiver/Community Supports: Patient lives with her sister [name] and her family, his mother lives in the same home. His sister is his main caregiver. He has paid caregivers for 3 hours a day 6 days a week.   I spent 48 minutes providing this consultation, time includes time spent with patient and family, chart review, provider coordination, and documentation. More than 50% of the time in this consultation was spent counseling and coordinating communication.   CHIEF COMPLAINT: Follow up palliative care visit.  History obtained from review of EMR, and discussion with patient and family. Records reviewed and summarized bellow.  HISTORY OF PRESENT ILLNESS:[name] Pegramis a 60 y.o. 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 Palliative Care was asked to help with symptom management and complex decision making. This is a follow up visit from  08/17/2019  CODE STATUS: DNR  PPS: 30%  HOSPICE ELIGIBILITY/DIAGNOSIS: TBD  PHYSICAL EXAM / ROS:   General: NAD, frail  appearing, thin, lying in bed Cardiovascular: no chest pain reported, no edema  Pulmonary: no cough, appeared dyspneic, oxygen saturation 94% on room air GI: no swallowing issues reported, appetite fair, denied constipation, continent of bowel GU: denies dysuria, chronic suprapubic cath, urine clear and yellow MSK:  Generalized muscle artrophy/ wasting, bilateral foot drop, right arm contracture Skin: no rashes or wounds reported Neurological: bilateral lower legs paralysis, left arm weakness Psych: non -anxious affect  PAST MEDICAL HISTORY:  Past Medical History:  Diagnosis Date  . Anxiety   . Bladder calculi   . Chronic back pain   . Chronic fatigue   . Depression   . Foley catheter in place   . Gait disorder   . GERD (gastroesophageal reflux disease)   . History of kidney stones   . Hyperlipidemia   . Hypertension   . Hypothyroidism   . Incontinence of urine   . Multiple sclerosis ([place]) NEUROLOGIST-- DR Jannifer Franklin   dx 1993, JC virus negative 2/14---  CURRENTLY RECEIVING TYSABRI IV TREATMENT  . Neurogenic bladder   . Neurogenic bowel    intermittant constipation and diarrhea  . Pneumonia    dx  03-03-2014--  admitted for iv and oral antibiotics  . Spastic quadriparesis ([place]) 04/19/2013  . Urinary retention   . Vitamin D deficiency   . Yeast infection    genital area    SOCIAL HX:  Social History   Tobacco Use  . Smoking status: Former Smoker    Packs/day: 1.50    Years: 8.00    Pack years: 12.00    Types: Cigarettes    Quit date: 06/17/1985    Years since quitting: 35.3  . Smokeless tobacco: Never Used  Substance Use Topics  . Alcohol use: No    Alcohol/week: 0.0 standard drinks   FAMILY HX:  Family History  Problem Relation Age of Onset  . Stroke Father   . Cirrhosis Brother   . Hypothyroidism Sister     ALLERGIES:  Allergies  Allergen Reactions  . Penicillins Other (See Comments)    > 20 yr ago, doesn't recall nature of reaction (didn't require  hospitalization) Tolerates Ancef, Rocephin, and Cefepime per chart review Does not recall having taken amox/amp since initial rxn; none documented in Epic   . Primaxin [Imipenem] Other (See Comments)    Seizure      PERTINENT MEDICATIONS:   Outpatient Encounter Medications as of 10/03/2020  Medication Sig  . acetic acid 0.25 % irrigation Irrigate with as directed daily. Instill 15m and clamp tube for 30 minutes then drain.  . ARIPiprazole (ABILIFY) 5 MG tablet TAKE 1 TABLET BY MOUTH EVERY DAY  . AZO-CRANBERRY PO Take 1 tablet by mouth 2 (two) times daily.  . baclofen (LIORESAL) 20 MG tablet TAKE 2 TABLETS (40 MG TOTAL) BY MOUTH 3 (THREE) TIMES DAILY.  .Marland KitchenbuPROPion (WELLBUTRIN XL) 150 MG 24 hr tablet TAKE 2 TABLETS BY MOUTH EVERY DAY  . clotrimazole-betamethasone (LOTRISONE) cream Apply 1 application topically 2 (two) times daily.  . diazepam (VALIUM) 5 MG tablet Take 1 tablet (5 mg total) by mouth 3 (three) times daily.  . Hydrocortisone (GERHARDT'S BUTT CREAM) CREA Apply 1 application topically 3 (three) times daily.  .Marland Kitchenlevothyroxine (SYNTHROID, LEVOTHROID) 125 MCG tablet Take 125 mcg by mouth daily.  .Marland Kitchenlisinopril-hydrochlorothiazide (PRINZIDE,ZESTORETIC) 10-12.5 MG tablet Take 0.5 tablets by mouth  daily.  . liver oil-zinc oxide (DESITIN) 40 % ointment Apply 1 application topically as needed for irritation.  . magnesium citrate SOLN Take 0.5 Bottles by mouth daily as needed for moderate constipation or severe constipation.  . mirtazapine (REMERON) 30 MG tablet TAKE 1 TABLET BY MOUTH AT BEDTIME  . MYRBETRIQ 50 MG TB24 tablet   . omeprazole (PRILOSEC) 20 MG capsule Take 20 mg by mouth every morning.   . polyethylene glycol (MIRALAX / GLYCOLAX) packet Take 17 g by mouth daily. (Patient taking differently: Take 17 g by mouth as needed. )  . Probiotic Product (PROBIOTIC-10 PO) Take by mouth daily.  Marland Kitchen tiZANidine (ZANAFLEX) 4 MG tablet TAKE 2 TABLETS BY MOUTH 3 TIMES A DAY  . topiramate  (TOPAMAX) 25 MG tablet TAKE 2 TABLETS BY MOUTH AT BEDTIME  . traMADol (ULTRAM) 50 MG tablet Take 1 tablet (50 mg total) by mouth every 6 (six) hours as needed.  . Vitamin D, Ergocalciferol, (DRISDOL) 50000 UNITS CAPS Take 50,000 Units by mouth every 7 (seven) days. Friday only  . Vitamins A & D (VITAMIN A & D) ointment Apply 1 application topically as needed for dry skin.    Thank you for the opportunity to participate in the care of Mr. Javarian Jakubiak. The palliative care team will continue to follow. Please call our office at [phone] if we can be of additional assistance.  Jari Favre, DNP, AGPCNP-BC

## 2020-10-23 ENCOUNTER — Encounter: Payer: Self-pay | Admitting: Neurology

## 2020-11-04 ENCOUNTER — Other Ambulatory Visit: Payer: Self-pay | Admitting: Neurology

## 2020-11-06 ENCOUNTER — Other Ambulatory Visit: Payer: Self-pay | Admitting: Neurology

## 2020-11-10 ENCOUNTER — Telehealth: Payer: Self-pay | Admitting: Neurology

## 2020-11-10 NOTE — Telephone Encounter (Signed)
Pt's sister called wanting an update on the pt's diazepam (VALIUM) 5 MG tablet P.A. Please advise.

## 2020-11-13 ENCOUNTER — Other Ambulatory Visit: Payer: Self-pay | Admitting: Neurology

## 2020-11-13 NOTE — Telephone Encounter (Signed)
Called and spoke to patient's sister.  She didn't know anything about needing a PA, however she said she called to just get a refill.    Maralyn Sago has already refilled precription.

## 2020-11-13 NOTE — Telephone Encounter (Signed)
I will send in a prescription for the diazepam.

## 2020-11-14 ENCOUNTER — Other Ambulatory Visit: Payer: Self-pay

## 2020-11-14 ENCOUNTER — Other Ambulatory Visit: Payer: Medicare Other | Admitting: Nurse Practitioner

## 2020-11-14 ENCOUNTER — Other Ambulatory Visit: Payer: Self-pay | Admitting: Neurology

## 2020-11-14 DIAGNOSIS — Z515 Encounter for palliative care: Secondary | ICD-10-CM

## 2020-11-14 DIAGNOSIS — G35 Multiple sclerosis: Secondary | ICD-10-CM

## 2020-11-14 NOTE — Progress Notes (Signed)
[facility] Pall[facility] Consult Note Telephone: [phone]  Fax: [phone]  PATIENT NAME: Samuel [name] [place] [address] [phone] (home)  DOB: [birth year] MRN: [id number]  PRIMARY CARE PROVIDER:    Clovia Cuff, MD,  [address] [place] [address] [phone]  REFERRING PROVIDER:   Clovia Cuff, MD [address] [place],  [place] [address] [phone]  RESPONSIBLE PARTY:   Extended Emergency Contact Information Primary Emergency Contact: Trani,Elaine Address: [place], [place] [address] [place] Phone: [phone] Relation: Mother Secondary Emergency Contact: Linus Mako Address: [place], [place] [address] [place] Phone: [phone] Work Phone: [phone] Mobile Phone: [phone] Relation: Sister  I met face to face with patient and family in home.  ASSESSMENT AND RECOMMENDATIONS:   Advance Care Planning: Patient's goal of care is comfort. Signed MOST and DNR forms in home, copy on [facility] Epic EMR. Details of MOST include, Comfort measures, antibiotics if indicated, IVF if indicated, feeding tube for long term.  Symptom Management: Patient report having fever for 10 days three weeks ago, he was exposed to [place] 19 virus through his sister and her husband, he however tested negative to rapid covid test, he was unable to complete PCR test due to non ambulatory condition. Patient was treated proactively for respiratory infection by his pcp. Patient report feeling well today, family voiced no concerns for him, report no change in patient condition. Patient up-to-date on his Covid vaccine include booster vaccine. His suprapubic cath was changed on 11/02/2020. Constipation: ongoing, LBM about a week ago. Patient and family report this is normal for him. He denied abdominal pain, denied nausea or  vomiting, endorsed some bloating, he think he may have bowel movement today, he continues on regimen of Miralax and docusate.   Follow up Palliative Care Visit: Palliative care will continue to follow for complex decision making and symptom management. Return in about 6 weeks or prn.  Family /Caregiver/Community Supports: Patient lives at home with his sister family, has paid care givers for 3 hours a day 6 days a week. His sister is his main caregiver.  Cognitive / Functional decline: Patient awake, alert,  And  coherent, able to make his own decisions. He is bed bound, generalized paralysis, only able to move his head, limited movement to his left arm. His is totally dependent for all his ADLs including feeding.  I spent 30 minutes providing this consultation. More than 50% of the time in this consultation was spent counseling and coordinating communication.   CHIEF COMPLAINT: Follow up palliative care visit  History obtained from review of EMR and  discussion with patient and family. Records reviewed and summarized bellow.  HISTORY OF PRESENT ILLNESS:[name] Pegramis a 60 y.o. 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 Palliative Care was asked to help with symptom management and to address goals of care. This is a follow up visit from 10/03/2020.  CODE STATUS: DNR  PPS: 30%  HOSPICE ELIGIBILITY/DIAGNOSIS: TBD  ROS  Constitutional; denies fever, denies, chills, denies fatigue EYES: denies acute vision changes ENMT: denies dysphagia Cardiovascular: denies chest pain, denies palpitation Pulmonary: denies cough, denies increased  SOB Abdomen: endorses fair appetite, chronic constipation, endorses incontinence of bowel GU: suprapubic cath MSK:  endorses ROM  limitations, no falls reported Skin: denies rashes or wounds Neurological: endorses weakness, denies uncontrol pain, denies insomnia Psych: Endorses positive mood Heme/lymph/immuno: denies bruises, abnormal bleeding   Physical Exam: Vital signs: BP 124/80, P 93, sats 96% on room air Current and past weights: no weight reported General: frail appearing, thin, lying in bed in NAD EYES: anicteric sclera, lids intact, no discharge  ENMT: intact hearing,oral mucous membranes moist CV:  no LE edema Pulmonary: no increased work of breathing, no cough, no audible wheezes, room air Abdomen: no ascites, +ve Bowel sounds GU: suprapubic cath patent, urine yellow and clear MSK: severe sarcopenia, generalized muscle athropy and wasting, right arm contracted, bilateral foot drop, non ambulatory Skin: warm and dry, no rashes or wounds on visible skin Neuro: Generalized weakness, A and O x 3 Psych: non-anxious affect today Hem/lymph/immuno: no widespread bruising   PAST MEDICAL HISTORY:  Past Medical History:  Diagnosis Date  . Anxiety   . Bladder calculi   . Chronic back pain   . Chronic fatigue   . Depression   . Foley catheter in place   . Gait disorder   . GERD (gastroesophageal reflux disease)   . History of kidney stones   . Hyperlipidemia   . Hypertension   . Hypothyroidism   . Incontinence of urine   . Multiple sclerosis ([facility]) NEUROLOGIST-- DR Jannifer Franklin   dx 1993, JC virus negative 2/14---  CURRENTLY RECEIVING TYSABRI IV TREATMENT  . Neurogenic bladder   . Neurogenic bowel    intermittant constipation and diarrhea  . Pneumonia    dx  03-03-2014--  admitted for iv and oral antibiotics  . Spastic quadriparesis ([facility]) 04/19/2013  . Urinary retention   . Vitamin D deficiency   . Yeast infection    genital area    SOCIAL HX:  Social History   Tobacco Use  . Smoking status: Former Smoker    Packs/day: 1.50    Years: 8.00    Pack years: 12.00    Types: Cigarettes    Quit date:  06/17/1985    Years since quitting: 35.4  . Smokeless tobacco: Never Used  Substance Use Topics  . Alcohol use: No    Alcohol/week: 0.0 standard drinks   FAMILY HX:  Family History  Problem Relation Age of Onset  . Stroke Father   . Cirrhosis Brother   . Hypothyroidism Sister     ALLERGIES:  Allergies  Allergen Reactions  . Penicillins Other (See Comments)    > 20 yr ago, doesn't recall nature of reaction (didn't require hospitalization) Tolerates Ancef, Rocephin, and Cefepime per chart review Does not recall having taken amox/amp since initial rxn; none documented in Epic   . Primaxin [Imipenem] Other (See Comments)    Seizure      PERTINENT MEDICATIONS:  Outpatient Encounter Medications as of 11/14/2020  Medication Sig  . acetic acid 0.25 % irrigation Irrigate with as directed daily. Instill 67ml and clamp tube for 30 minutes then drain.  . ARIPiprazole (ABILIFY) 5 MG tablet TAKE 1 TABLET BY MOUTH EVERY DAY  . AZO-CRANBERRY PO Take 1 tablet by mouth 2 (two) times daily.  . baclofen (LIORESAL) 20 MG tablet TAKE 2 TABLETS (40 MG TOTAL) BY MOUTH 3 (THREE) TIMES DAILY.  Marland Kitchen buPROPion (WELLBUTRIN XL) 150 MG 24 hr tablet TAKE 2 TABLETS BY MOUTH EVERY DAY  . clotrimazole-betamethasone (LOTRISONE) cream Apply 1  application topically 2 (two) times daily.  . diazepam (VALIUM) 5 MG tablet TAKE 1 TABLET BY MOUTH THREE TIMES A DAY  . Hydrocortisone (GERHARDT'S BUTT CREAM) CREA Apply 1 application topically 3 (three) times daily.  Marland Kitchen levothyroxine (SYNTHROID, LEVOTHROID) 125 MCG tablet Take 125 mcg by mouth daily.  Marland Kitchen lisinopril-hydrochlorothiazide (PRINZIDE,ZESTORETIC) 10-12.5 MG tablet Take 0.5 tablets by mouth daily.  Marland Kitchen liver oil-zinc oxide (DESITIN) 40 % ointment Apply 1 application topically as needed for irritation.  . magnesium citrate SOLN Take 0.5 Bottles by mouth daily as needed for moderate constipation or severe constipation.  . mirtazapine (REMERON) 30 MG tablet TAKE 1 TABLET BY  MOUTH AT BEDTIME  . MYRBETRIQ 50 MG TB24 tablet   . omeprazole (PRILOSEC) 20 MG capsule Take 20 mg by mouth every morning.   . polyethylene glycol (MIRALAX / GLYCOLAX) packet Take 17 g by mouth daily. (Patient taking differently: Take 17 g by mouth as needed. )  . Probiotic Product (PROBIOTIC-10 PO) Take by mouth daily.  Marland Kitchen tiZANidine (ZANAFLEX) 4 MG tablet TAKE 2 TABLETS BY MOUTH 3 TIMES A DAY  . topiramate (TOPAMAX) 25 MG tablet TAKE 2 TABLETS BY MOUTH AT BEDTIME  . traMADol (ULTRAM) 50 MG tablet Take 1 tablet (50 mg total) by mouth every 6 (six) hours as needed.  . Vitamin D, Ergocalciferol, (DRISDOL) 50000 UNITS CAPS Take 50,000 Units by mouth every 7 (seven) days. Friday only  . Vitamins A & D (VITAMIN A & D) ointment Apply 1 application topically as needed for dry skin.    Thank you for the opportunity to participate in the care of Mr. Kacy Conely. The palliative care team will continue to follow. Please call our office at [phone] if we can be of additional assistance.  Alba Destine, NP , DNP, AGPCNP-BC  COVID-19 PATIENT SCREENING TOOL  Person answering questions: Patient   1.  Is the patient or any family member in the home showing any signs or symptoms regarding respiratory infection?               Person with Symptom- __________None______________  a. Fever                                                                          Yes___ No_x__          ___________________  b. Shortness of breath                                                    Yes___ No_x__          ___________________ c. Cough/congestion                                       Yes___  No___         ___________________ d. Body aches/pains  Yes___ No_x__        ____________________ e. Gastrointestinal symptoms (diarrhea, nausea)           Yes___ No_x__        ____________________  2. Within the past 14 days, has anyone living in the home had any  contact with someone with or under investigation for COVID-19?    Yes___ No_X_

## 2020-12-17 ENCOUNTER — Other Ambulatory Visit: Payer: Self-pay | Admitting: Neurology

## 2021-01-31 ENCOUNTER — Other Ambulatory Visit: Payer: Self-pay | Admitting: Neurology

## 2021-02-07 ENCOUNTER — Other Ambulatory Visit: Payer: Self-pay

## 2021-02-07 ENCOUNTER — Other Ambulatory Visit: Payer: Medicare Other | Admitting: Nurse Practitioner

## 2021-02-07 VITALS — BP 99/60 | HR 81

## 2021-02-07 DIAGNOSIS — Z515 Encounter for palliative care: Secondary | ICD-10-CM

## 2021-02-07 DIAGNOSIS — G35 Multiple sclerosis: Secondary | ICD-10-CM

## 2021-02-07 NOTE — Progress Notes (Signed)
[facility] Consult Note Telephone: [phone]  Fax: [phone]    Date of encounter: 02/07/21 PATIENT NAME: Samuel [name] [place] [address]   [phone] (home)  DOB: [birth year] MRN: [id number]  PRIMARY CARE PROVIDER:    Clovia Cuff, MD,  [place] [address] [phone]  REFERRING PROVIDER:   Clovia Cuff, MD [address] [place],  [place] [address] [phone]  RESPONSIBLE PARTY:    Contact Information    Name Relation Home Work [place] Mother [phone]     Rhae Hammock [phone]   Powers,Carl Other [phone]       I met face to face with patient and family in home. Palliative Care was asked to follow this patient by consultation request of  Clovia Cuff, MD to address advance care planning and complex medical decision making. This is a follow up visit.                                  ASSESSMENT AND PLAN / RECOMMENDATIONS:   Advance Care Planning/Goals of Care: Goals include to maximize quality of life and symptom management. Our advance care planning conversation included a discussion about:     The value and importance of advance care planning   Experiences with loved ones who have been seriously ill or have died   Exploration of personal, cultural or spiritual beliefs that might influence medical decisions   Exploration of goals of care in the event of a sudden injury or illness   Identification and preparation of a healthcare agent   Review of an  advance directive document .  CODE STATUS: DNR Goal of care: Goal of care is comfort. Directives: Signed DNR and MOST forms present in home,  copy on [facility] EMR. Details of MOST include, Comfort measures, antibiotics if indicated, IVF if indicated, feeding tube for long term. Reviewed form with no change made.  Symptom Management/Plan: Multiple  sclerosis: Condition is ongoing and progressing. Patient is total care for his ADLS, he lives with his sister and mother. Family report patient refuses to have his nail trimmed. Patient agreed to have his finger nails trimmed during visit today. This NP completed the procedure with a nail clipper and nail file provided by his family, patient tolerated procedure well, no injury or bleeding noted.  Palliative care will continue to provide support to patient, family and medical team. Provided general support and encouragement, no other unmet needs identified at this time.  Follow up Palliative Care Visit: Palliative care will continue to follow for complex medical decision making, advance care planning, and clarification of goals. Return in about 6 weeks or prn.  PPS: 30%  HOSPICE ELIGIBILITY/DIAGNOSIS: TBD  CHIEF COMPLAIN: follow up palliative care visit  History obtained from review of Epic EMR and discussion with Samuel [name] and his family.   HISTORY OF PRESENT ILLNESS:[name] Pegramis a 60y.o. year old malewith multiple medical problems including multiple sclerosis(dx 1993, relapsed 2004)with spastic quadriparesis, chronic painsyndrome,chronic suprapubic cathsecondary toneurogenic bladder,recurrent UTIs (none in more than 2 years),depression, hypothyroidism.  Patient noted with long finger nails creating a dent in the skin of his folded hands. Fingers without evidence of fungal infection. Patient is bed bound and dependent on persons for all his ADLs including feeding. He report feeling well today without report of acute change in condition.  No report of fever, chills, increased SOB, or any uncontrolled pain.No report of coughing or spluttering during meals.  Vitals:   02/07/21 1108  BP: 99/60  Pulse: 81  SpO2: 95%   Physical Exam: Current and past weights: no weight reported General: frail appearing, thin, lying in bed in ND EYES: anicteric sclera, no discharge  ENMT: intact  hearing, oral mucous membranes moist CV: no LE edema Pulmonary: no increased work of breathing, no cough, no audible wheezes, room air Abdomen:no ascites, +ve Bowel sounds GU: suprapubic cath patent, urine yellow and clear HWE:[id number], generalized muscle athropy and wasting, right arm contracted, bilateral foot drop, non ambulatory Skin: warm and dry, no rashes or wounds on visible skin, nails long and cutting in folded hand Neuro: Generalized weakness,A and O x 3 Psych: non-anxious affecttoday Hem/lymph/immuno: no widespread bruising   Past Medical History:  Diagnosis Date  . Anxiety   . Bladder calculi   . Chronic back pain   . Chronic fatigue   . Depression   . Foley catheter in place   . Gait disorder   . GERD (gastroesophageal reflux disease)   . History of kidney stones   . Hyperlipidemia   . Hypertension   . Hypothyroidism   . Incontinence of urine   . Multiple sclerosis ([facility]) NEUROLOGIST-- DR Jannifer Franklin   dx 1993, JC virus negative 2/14---  CURRENTLY RECEIVING TYSABRI IV TREATMENT  . Neurogenic bladder   . Neurogenic bowel    intermittant constipation and diarrhea  . Pneumonia    dx  03-03-2014--  admitted for iv and oral antibiotics  . Spastic quadriparesis ([facility]) 04/19/2013  . Urinary retention   . Vitamin D deficiency   . Yeast infection    genital area   I spent 48 minutes providing this consultation. More than 50% of the time in this consultation was spent in counseling and care coordination.  Thank you for the opportunity to participate in the care of Mr. Ju.  The palliative care team will continue to follow. Please call our office at [phone] if we can be of additional assistance.   Jari Favre, DNP, AGPCNP-BC  COVID-19 PATIENT SCREENING TOOL Asked and negative response unless otherwise noted:   Have you had symptoms of covid, tested positive or been in contact with someone with symptoms/positive test in the past 5-10 days?

## 2021-02-28 ENCOUNTER — Other Ambulatory Visit: Payer: Self-pay | Admitting: Neurology

## 2021-02-28 MED ORDER — OCREVUS 300 MG/10ML IV SOLN: 600.0000 mg | Freq: Once | INTRAVENOUS | 0 refills | Status: AC

## 2021-03-13 ENCOUNTER — Telehealth: Payer: Self-pay

## 2021-03-13 NOTE — Telephone Encounter (Signed)
Samuel [name] returned my call and we discussed message. I relayed Ocrevus was change to every 8 months on 02/28/21 per Dr. Anne Hahn due to low IgM level, she verbalized understanding.  Samuel [name] asked for order to be sent to # [phone] for processing. I advised Samuel [name] this was an Physicist, medical order since Dr. Anne Hahn is out of the office this week and she sts this should be acceptable.  Order has been faxed to number with confirmation received.  Samuel [name] will CB if any issues come up.

## 2021-03-13 NOTE — Telephone Encounter (Signed)
Received note from Infusion nurse at [facility] stating she had received a vm from an Elisabeth Cara, Clinical Nurse home health specialty service about pt's Ocrevus infusion.  Marchelle Folks needed to confirm dosage frequency for Ocrevus?  Per epic, Dr. Anne Hahn changed dosage frequency to every 8 months due to low IgM level on 02/28/2021.  Amanda left # to call back as [phone]. I called her and left a vm asking her to call back so we can clarify needed order.

## 2021-03-27 ENCOUNTER — Other Ambulatory Visit: Payer: Medicare Other | Admitting: *Deleted

## 2021-03-27 ENCOUNTER — Other Ambulatory Visit: Payer: Self-pay

## 2021-03-27 ENCOUNTER — Other Ambulatory Visit: Payer: Medicare Other

## 2021-03-27 DIAGNOSIS — Z515 Encounter for palliative care: Secondary | ICD-10-CM

## 2021-03-28 NOTE — Progress Notes (Signed)
COMMUNITY PALLIATIVE CARE SW NOTE  PATIENT NAME: Samuel [name] DOB: 1961-01-04 MRN: [id number]  PRIMARY CARE PROVIDER: Annita Brod, MD  RESPONSIBLE PARTY:  Acct ID - Guarantor Home Phone Work Phone Relationship Acct Type  [id number] [facility]* [phone]  Self P/F     [address], [place], [place] [address]     PLAN OF CARE and INTERVENTIONS:             GOALS OF CARE/ ADVANCE CARE PLANNING:  Patient goal is to remain to home. Patient has a MOST form. SOCIAL/EMOTIONAL/SPIRITUAL ASSESSMENT/ INTERVENTIONS:  SW and RN-Samuel [name] (via telehealth) completed a visit with patient at his home. Patient was in bed, but awake, alert and oriented x3. Patient denied pain. He reported that his appetite was good and usually eats about two good meals per day. He does have shortness of breath most of the time. Patient reported that his head itches and his sitter reported that he has an ingrown toenail. He states that he has discussed this with his PCP during last visit, but wanted the RN to also follow-up. He stated he is supposed to have a referral to see the podiatrist, but has not heard anything else about it. Patient reported that overall there has been no changes with him. He has a private caregiver 2x's day for 2 hours in the morning, and 1 hour in the evening to assist with personal care, feeding and turning him. He lives in the home with his sister-Samuel [name] who serves as his primary caregiver. SW consulted with Samuel [name] who affirmed that patient remains stable. Patient and PCG remain open to ongoing palliative care visits and support.  PATIENT/CAREGIVER EDUCATION/ COPING:  Patient appears to be coping well. His sister is supportive and he has in-home private caregiver support.  PERSONAL EMERGENCY PLAN:  911 can be activated for emergencies. COMMUNITY RESOURCES COORDINATION/ HEALTH CARE NAVIGATION:  Patient has a private caregiver. FINANCIAL/LEGAL CONCERNS/INTERVENTIONS:  None.     SOCIAL HX:   Social History   Tobacco Use   Smoking status: Former    Packs/day: 1.50    Years: 8.00    Pack years: 12.00    Types: Cigarettes    Quit date: 06/17/1985    Years since quitting: 35.8   Smokeless tobacco: Never  Substance Use Topics   Alcohol use: No    Alcohol/week: 0.0 standard drinks    CODE STATUS: To be assessed ADVANCED DIRECTIVES: No MOST FORM COMPLETE:  Yes HOSPICE EDUCATION PROVIDED: NO  PPS: Patient is total care, bedbound, but is alert and oriented x3.   Duration of visit and documentation: 45 minutes.       [address] [place], [place]

## 2021-04-02 NOTE — Progress Notes (Signed)
[facility] PALLIATIVE CARE RN NOTE  PATIENT NAME: Samuel [name] DOB: [birth year] MRN: [id number]  PRIMARY CARE PROVIDER: Annita Brod, MD  RESPONSIBLE PARTY:  Acct ID - Guarantor Home Phone Work Phone Relationship Acct Type  [id number] [facility]* [phone]  Self P/F     [address], [place], [place] [address]   Due to the COVID-19 crisis, this virtual check-in visit was done via telephone from my office and it was initiated and consent by this patient and or family.  PLAN OF CARE and INTERVENTION:  ADVANCE CARE PLANNING/GOALS OF CARE: Goal is for patient to remain in his home and avoid hospitalizations.  PATIENT/CAREGIVER EDUCATION: Symptom management, safe mobility, s/s of infection DISEASE STATUS: In-person visit made by palliative care MSW, Clydia Llano who conferenced me in via audio-visual secure connection. Patient lying in bed awake and alert. He is able to engage in appropriate conversation and make his needs known. His main concerns is an ingrown toenail and also c/o ongoing itchy scalp. He was seen by Dr. Maceo Pro office in the home about 3 weeks ago. He was given several recommendations regarding what to try for his scalp, however he states that nothing has helped. Advised will reach out to Dr. Maceo Pro office to discuss. He denies any pain. He does experience some shortness of breath with exertion. His appetite is good. He eats 2 meals/day. He has a hired caregiver that comes twice daily to assist with his personal care needs. He is total care and bed bound. Overall, his condition is stable.   HISTORY OF PRESENT ILLNESS: This is a 60 yo male with a diagnosis of Multiple Sclerosis. Palliative care team continues to follow patient for goals of care and complex decision making.  CODE STATUS: DNR ADVANCED DIRECTIVES: N MOST FORM: yes PPS: 30%   (Duration of visit and documentation 30 minutes)   Candiss Norse, RN BSN

## 2021-04-03 NOTE — Telephone Encounter (Signed)
Paper order completed, faxed back to [phone]

## 2021-04-04 ENCOUNTER — Other Ambulatory Visit: Payer: Self-pay | Admitting: Neurology

## 2021-04-10 ENCOUNTER — Other Ambulatory Visit: Payer: Self-pay | Admitting: Neurology

## 2021-04-28 ENCOUNTER — Other Ambulatory Visit: Payer: Self-pay | Admitting: Neurology

## 2021-05-01 ENCOUNTER — Telehealth: Payer: Self-pay | Admitting: Neurology

## 2021-05-01 MED ORDER — MIRTAZAPINE 30 MG PO TABS: 30.0000 mg | ORAL_TABLET | Freq: Every day | ORAL | 1 refills | Status: DC

## 2021-05-01 MED ORDER — ARIPIPRAZOLE 5 MG PO TABS: 5.0000 mg | ORAL_TABLET | Freq: Every day | ORAL | 1 refills | Status: DC

## 2021-05-01 NOTE — Telephone Encounter (Signed)
Refills have been sent.  

## 2021-05-01 NOTE — Telephone Encounter (Signed)
Sarah from [facility] called requesting refill for ARIPiprazole (ABILIFY) 5 MG tablet and mirtazapine (REMERON) 30 MG tablet Pharmacy [facility] - [place], [place] - [address]

## 2021-05-09 ENCOUNTER — Other Ambulatory Visit: Payer: Self-pay | Admitting: Neurology

## 2021-06-15 ENCOUNTER — Other Ambulatory Visit: Payer: Self-pay | Admitting: Neurology

## 2021-08-14 ENCOUNTER — Other Ambulatory Visit: Payer: Self-pay | Admitting: Neurology

## 2021-09-11 ENCOUNTER — Other Ambulatory Visit: Payer: Self-pay | Admitting: Neurology

## 2021-10-30 ENCOUNTER — Telehealth: Payer: Self-pay

## 2021-10-30 NOTE — Telephone Encounter (Signed)
Spoke with patient's sister, [name]. [name] shared that patient is doing well and she does not feel the need to continue with Palliative care. Will sign off. Patient's sister, [name], knows to notify PCP if Palliative care is needed.

## 2021-11-13 ENCOUNTER — Telehealth: Payer: Self-pay | Admitting: Neurology

## 2021-11-13 NOTE — Telephone Encounter (Signed)
Sent patient a mychart message in regards to the following: ?We got a report on this pt for Dr. Anne Hahn to review. However, he was last seen 06/27/20 by Dr. Anne Hahn.  Has no follow up scheduled. Has seen Maralyn Sago in the past. Can you please reach out to him to let him know he is past due for appointment and get him scheduled with either Maralyn Sago, NP or Dr. Epimenio Foot or Dr. Marjory Lies? There was no mention in Dr. Anne Hahn' last note of who is was going to be transferring care to.  ?

## 2021-11-19 ENCOUNTER — Other Ambulatory Visit: Payer: Self-pay | Admitting: Neurology

## 2021-11-22 NOTE — Telephone Encounter (Signed)
Pt last seen VV by Dr. Anne Hahn 06-27-20. Pt bedridden.  MS - Ocrevus.  You have seen pt previously.  Has appt with Dr. Marjory Lies 01-08-2022. [facility] drug registry checked last fill 07-03-2020 #60. ?

## 2021-11-22 NOTE — Telephone Encounter (Signed)
I called the patient, requesting refill of Tramadol, I will refill it for him, he takes it before doctors appointments, he has 2 dental visits coming up. It was last filled in November 2021, he uses sparingly. Gets home infusion of Ocrevus, Dr. Jannifer Franklin had every 8 months, I told him we could check labs at visit in May make decision about interval 6 vs 8 months at that time.  ? ?Meds ordered this encounter  ?Medications  ? traMADol (ULTRAM) 50 MG tablet  ?  Sig: TAKE ONE TABLET BY MOUTH EVERY 6 HOURS AS NEEDED  ?  Dispense:  60 tablet  ?  Refill:  0  ?  [place] drug registry checked last fill 07-03-2020 #60  ? ? ?

## 2021-12-24 ENCOUNTER — Encounter: Payer: Self-pay | Admitting: Diagnostic Neuroimaging

## 2021-12-25 ENCOUNTER — Telehealth: Payer: Self-pay | Admitting: *Deleted

## 2021-12-25 DIAGNOSIS — Z5181 Encounter for therapeutic drug level monitoring: Secondary | ICD-10-CM

## 2021-12-25 DIAGNOSIS — G35 Multiple sclerosis: Secondary | ICD-10-CM

## 2021-12-25 NOTE — Telephone Encounter (Signed)
Sister called re: my chart message she sent- labs for ocrevus infusion. She stated patient is seen by [facility] call Dr Clovia Cuff, ph [phone].  The patient was started on new med for IBS and Dr Stephan Minister has ordered lab to check liver function. The lab is to be drawn today.  ?[name] stated patient would like Ocrevus labs drawn at same time instead of when he comes to see Dr Stephannie Li May 9th. She stated Dr Jannifer Franklin used to order labs and determine whether he got Ocrevus every 6 months or every 8 months based on lab results. He recently received ocrevus every 8 months, last infusion Oct 2022.  ?I advised I'll discuss with Dr Leta Baptist and call her back . [name] verbalized understanding, appreciation. ? ? ? ?

## 2021-12-25 NOTE — Telephone Encounter (Signed)
Per Dr Leta Baptist: Agree for labs at home visit. Ig panel, CBC, CMP.  I called sister [name] and advised her of labs Dr Leta Baptist wants done. She stated she talks with Reanna at Dr Altamese Dilling office, so I will call her to get details of where ot fax lab requests. [name] verbalized understanding, appreciation. ?Called # and got VM for Reanna. Left VM requesting call back to discuss mutual patient.  ?

## 2021-12-26 ENCOUNTER — Encounter: Payer: Self-pay | Admitting: *Deleted

## 2021-12-26 ENCOUNTER — Telehealth: Payer: Self-pay | Admitting: *Deleted

## 2021-12-26 NOTE — Telephone Encounter (Signed)
Received form from [employer] re: prescriber form expires on 12/27/21. It must be completed prior to that date for patient to continue to receive free Ocrevus. Patient's next FU is 01/08/22.  Next infusion date is 02/02/22.  ?

## 2021-12-26 NOTE — Telephone Encounter (Signed)
Called Samuel [name] again, LVM #2 requesting call back re: patient's labs and where orders need to be faxed.   ?

## 2021-12-27 NOTE — Telephone Encounter (Signed)
Received call from [facility], Dr Asesno's office. I explained about patient wanting all labs drawn at same time for upcoming Ocrevus infusion. I need to know where to fax orders . She stated to fax to his office.  Fax [phone] attention Dr Darleene Cleaver, they will get orders where they need to go. Orders on Dr [employer] desk for signature.  ?

## 2021-12-27 NOTE — Telephone Encounter (Signed)
Ocrevus PAP signed and faxed to [facility], received confirmation. ?

## 2021-12-27 NOTE — Telephone Encounter (Signed)
CBC w/diff, CMP and Ig panel lab orders signed, faxed to Dr Daphene Jaeger. Received confirmation.  ?

## 2022-01-03 NOTE — Telephone Encounter (Signed)
Received fax from [place] needing insurance information and place to ship Ocrevus. Per our records he uses [facility]. Contacted patient for additional information. Cherrie Distance, nurse for patient and LVM requesting call back.  ?

## 2022-01-03 NOTE — Telephone Encounter (Signed)
Received call back form Barbara Cower who stated he wasn't able to give me any information, advised I call [facility]. Called [facility], reached VM of infusion services, LVM requesting call back.  ?

## 2022-01-07 ENCOUNTER — Encounter: Payer: Self-pay | Admitting: Diagnostic Neuroimaging

## 2022-01-07 NOTE — Telephone Encounter (Signed)
Received call from [place] with [facility], home infusion pharmacy. He stated Denyse Amass advised he call. [facility] is their sister pharmacy. [facility] provided patient's last ocvrevus. Their last infusion for patient was on 06/08/2021 and they discharged him. He recommends we find new home infusion pharmacy. Contacted Rozann Lesches with [facility] through community message.  ? ?

## 2022-01-07 NOTE — Telephone Encounter (Signed)
Called [facility], spoke with Georgina Snell who stated [facility]. She is not available; he'll have her call me back.  ?

## 2022-01-08 ENCOUNTER — Ambulatory Visit: Payer: Medicare Other | Admitting: Diagnostic Neuroimaging

## 2022-01-08 ENCOUNTER — Encounter: Payer: Self-pay | Admitting: Diagnostic Neuroimaging

## 2022-01-08 DIAGNOSIS — Z5181 Encounter for therapeutic drug level monitoring: Secondary | ICD-10-CM

## 2022-01-08 DIAGNOSIS — G35 Multiple sclerosis: Secondary | ICD-10-CM | POA: Diagnosis not present

## 2022-01-08 NOTE — Telephone Encounter (Signed)
Samuel [name] called back, stated they aren't able to bill services if they can't bill for drug. Patient is in free drug program. [facility] will not be able to service patient for his Ocrevus. I Samuel [name] for his call back.  ?

## 2022-01-08 NOTE — Telephone Encounter (Addendum)
Samuel [name] called back, stated he missed a call from our office. I advised him it was not from me. He verbalized understanding, appreciation. ? ?

## 2022-01-08 NOTE — Patient Instructions (Signed)
?-   recommend to stop ocrevus (due to lack of effectiveness, low Ig levels) ? ?- will consider transition to oral meds (consider aubagio, tecfidera, vumerity) or injectable (rebif, avonex, plegridy or copaxone) ?

## 2022-01-08 NOTE — Telephone Encounter (Signed)
Called Samuel [name] with [facility], LVM requesting call back. ?

## 2022-01-08 NOTE — Telephone Encounter (Signed)
Per Dr Leta Baptist, patient will no longer receive Ocrevus. Called [employer] [facility] [facility], spoke with Jenny Reichmann and informed her the patient will no longer receive Ocrevus. She will update and close the case, verbalized understanding, appreciation. ? ?

## 2022-01-08 NOTE — Progress Notes (Signed)
? ?[facility] ? ?PATIENT: Samuel [name] ?DOB: 1960-10-30 ? ?REFERRING CLINICIAN: Annita Brod, MD ?HISTORY FROM: patient, sister, friend ?REASON FOR VISIT: follow up ? ? ?HISTORICAL ? ?CHIEF COMPLAINT:  ?Chief Complaint  ?Patient presents with  ? Multiple Sclerosis  ?  Rm 7 TOC [name], last seen 06/2020, sister/POA- [name], friend- [place]   ? ? ?HISTORY OF PRESENT ILLNESS:  ? ? ?UPDATE (01/08/22, VRP): Since last visit, doing about the same.  Last Ocrevus in November 2022.  Reports gradual progression of MS symptoms for over 10 years in spite of Tysabri and Ocrevus use.  Other meds are being refilled by PCP.   ? ?Today 06/23/19: Mr. 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  He remains on Ocrevus, he receives his infusions at our office.  He indicates he is not able to discern any change, but reports overall slow progression.  He has a suprapubic catheter.  They are working with urology to establish with a home health agency who will be able to come out on a monthly basis to provide catheter care.  He remains in a wheelchair.  He has no use of the right arm or legs.  He has minimal use of the left arm. He is not able to feed himself.  His constipation has improved with the use of daily probiotic.  He says on average he will have a bowel movement every 3 days.  He lives with his sister, [name] who is his primary caregiver.  He indicates his headaches have improved while taking Topamax.  He is planning to have several teeth extracted, but the procedure had to be pushed to January, because they didn't want it to occur too close to his Ocrevus infusion.  He does receive a [place] for the Ocrevus medication.  His next infusion is scheduled for November 23, which is a little late, to allow time for paperwork with the [employer] funding.  He is prescribed tramadol to take on days where he travels out of the home or if he has a catheter change.   It is becoming increasingly more difficult for him to be taken out of the home.  From this office he is prescribed Abilify, baclofen, Wellbutrin, Valium, Remeron, tizanidine, Topamax, and tramadol.  He presents today for follow-up accompanied by his sister. ?  ?HISTORY 05/19/2018 Dr. Anne Hahn: Mr. 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  The patient is on Ocrevus, his next dose is in early October 2019.  The patient was seen in the hospital on 20 April 2018 with a urinary tract infection.  The patient is followed through urology, he wishes to switch physicians, however.  The patient has been suffering from increased problems with depression, he is having frequent crying spells.  The patient was recently placed on Wellbutrin, he has been on the 300 mg dose for about 1 week.  He takes perphenazine 2 mg twice daily.  The patient is having some problems with headaches that are occurring at least twice a week, the headaches are around the right eye, they are responsive to Advil.  The patient has no use of his arms and legs at this point, he has to be fed.  The patient also has problems with temperature instability, he feels hot at one point and then cold the next, he cannot seem to get his body temperature right.  The patient overall  is relatively miserable, his family is helping to take care of him.  He is concerned about his financial situation.  ? ?UPDATE 12/14/14 Dr. Terrace Arabia: Diagnosis of MS was in 1993, he presented with right optic neuritis in 1986, only had partial recovery, later on, he had relapsing episode, presented with progressive working difficulty, urinary urgency, retention, rapid worsening since 2002, eventually become wheelchair bound in 2014, he used to operate his wheelchair using his right hand, which has gradual worsening spasticity, loosing function, now he has to reach over with his left hand, to operate his wheelchair,  which is getting more difficulty, because increased left hand muscle spasm, and weakness, ? ? ?PRIOR HPI 11/14/14 Willis: Mr. Eichorn is a 61 year old right-handed white male with a history of multiple sclerosis with a spastic quadriparesis. The patient has chronic progressive multiple sclerosis, recently he has come off of Tysabri. He indicates that he developed rapid onset of dysfunction of the left arm and hand in December 2015, and he stopped the Tysabri after that point. The patient has had increased discomfort in the lower extremities off of Tysabri. He has not had any overt MS attacks, however. The patient is having ongoing spasticity in the legs at night associated with some painful spasms of the legs and bladder spasms. The patient has had a suprapubic catheter placement. He has a caretaker that helps maintain him in the home environment. The patient is not ambulatory, he has to be transferred from the bed to the chair using a Hoyer lift. The patient reports that he has heat and sweats of his lower body at night, coldness and chills in his upper body at night. The spasm of the legs continues to be a problem. He is having some issues with anxiety as well in the evening hours. He is on clonazepam taking 1 mg twice daily, he has diazepam 10 mg take at night if needed. He returns to this office for an evaluation. ? ? ? ?REVIEW OF SYSTEMS: Full 14 system review of systems performed and negative with exception of: as per HPI. ? ?ALLERGIES: ?Allergies  ?Allergen Reactions  ? Penicillins Other (See Comments)  ?  > 20 yr ago, doesn't recall nature of reaction (didn't require hospitalization) ?Tolerates Ancef, Rocephin, and Cefepime per chart review ?Does not recall having taken amox/amp since initial rxn; none documented in Epic ?  ? Primaxin [Imipenem] Other (See Comments)  ?  Seizure   ? ? ?HOME MEDICATIONS: ?Outpatient Medications Prior to Visit  ?Medication Sig Dispense Refill  ? acetic acid 0.25 % irrigation  Irrigate with as directed daily. Instill 40ml and clamp tube for 30 minutes then drain. 500 mL 12  ? ARIPiprazole (ABILIFY) 5 MG tablet Take 1 tablet (5 mg total) by mouth daily. 90 tablet 1  ? AZO-CRANBERRY PO Take 1 tablet by mouth 2 (two) times daily.    ? baclofen (LIORESAL) 20 MG tablet TAKE TWO TABLETS BY MOUTH THREE TIMES A Day 540 tablet 1  ? buPROPion (WELLBUTRIN XL) 150 MG 24 hr tablet Take 2 tablets (300 mg total) by mouth daily. Must make follow up for further refills. 180 tablet 0  ? clotrimazole-betamethasone (LOTRISONE) cream Apply 1 application topically 2 (two) times daily.    ? diazepam (VALIUM) 5 MG tablet TAKE ONE TABLET BY MOUTH THREE TIMES A Day 270 tablet 1  ? Hydrocortisone (GERHARDT'S BUTT CREAM) CREA Apply 1 application topically 3 (three) times daily. 1 each 0  ? levothyroxine (SYNTHROID, LEVOTHROID) 125 MCG tablet  Take 125 mcg by mouth daily.    ? lisinopril-hydrochlorothiazide (PRINZIDE,ZESTORETIC) 10-12.5 MG tablet Take 0.5 tablets by mouth daily.    ? liver oil-zinc oxide (DESITIN) 40 % ointment Apply 1 application topically as needed for irritation.    ? magnesium citrate SOLN Take 0.5 Bottles by mouth daily as needed for moderate constipation or severe constipation.    ? mirtazapine (REMERON) 30 MG tablet Take 1 tablet (30 mg total) by mouth at bedtime. 90 tablet 1  ? MYRBETRIQ 50 MG TB24 tablet     ? omeprazole (PRILOSEC) 20 MG capsule Take 20 mg by mouth every morning.     ? Probiotic Product (PROBIOTIC-10 PO) Take by mouth daily.    ? tiZANidine (ZANAFLEX) 4 MG tablet TAKE 2 TABLETS BY MOUTH 3 TIMES A DAY 540 tablet 0  ? topiramate (TOPAMAX) 25 MG tablet TAKE 2 TABLETS BY MOUTH AT BEDTIME 180 tablet 1  ? traMADol (ULTRAM) 50 MG tablet TAKE ONE TABLET BY MOUTH EVERY 6 HOURS AS NEEDED 60 tablet 0  ? Vitamin D, Ergocalciferol, (DRISDOL) 50000 UNITS CAPS Take 50,000 Units by mouth every 7 (seven) days. Friday only    ? Vitamins A & D (VITAMIN A & D) ointment Apply 1 application  topically as needed for dry skin.    ? polyethylene glycol (MIRALAX / GLYCOLAX) packet Take 17 g by mouth daily. (Patient not taking: Reported on 01/08/2022) 14 each 0  ? ?Facility-Administered Medications Prior

## 2022-01-08 NOTE — Telephone Encounter (Signed)
Samuel [name] returned call, stated he'd have to check with their pharmacy but may not be able to assist if they can't bill for nursing services.  He'll reply via community message.  ?

## 2022-01-09 LAB — CBC WITH DIFFERENTIAL/PLATELET
Basophils Absolute: 0.1 10*3/uL (ref 0.0–0.2)
Basos: 1 %
EOS (ABSOLUTE): 0.1 10*3/uL (ref 0.0–0.4)
Eos: 1 %
Hematocrit: 50 % (ref 37.5–51.0)
Hemoglobin: 16.7 g/dL (ref 13.0–17.7)
Immature Grans (Abs): 0.1 10*3/uL (ref 0.0–0.1)
Immature Granulocytes: 1 %
Lymphocytes Absolute: 1.8 10*3/uL (ref 0.7–3.1)
Lymphs: 20 %
MCH: 29.3 pg (ref 26.6–33.0)
MCHC: 33.4 g/dL (ref 31.5–35.7)
MCV: 88 fL (ref 79–97)
Monocytes Absolute: 0.7 10*3/uL (ref 0.1–0.9)
Monocytes: 8 %
Neutrophils Absolute: 6.3 10*3/uL (ref 1.4–7.0)
Neutrophils: 69 %
Platelets: 236 10*3/uL (ref 150–450)
RBC: 5.69 x10E6/uL (ref 4.14–5.80)
RDW: 13.1 % (ref 11.6–15.4)
WBC: 9.1 10*3/uL (ref 3.4–10.8)

## 2022-01-09 LAB — COMPREHENSIVE METABOLIC PANEL
ALT: 51 IU/L — ABNORMAL HIGH (ref 0–44)
AST: 26 IU/L (ref 0–40)
Albumin/Globulin Ratio: 2.1 (ref 1.2–2.2)
Albumin: 4.9 g/dL — ABNORMAL HIGH (ref 3.8–4.8)
Alkaline Phosphatase: 181 IU/L — ABNORMAL HIGH (ref 44–121)
BUN/Creatinine Ratio: 13 (ref 10–24)
BUN: 9 mg/dL (ref 8–27)
Bilirubin Total: 0.4 mg/dL (ref 0.0–1.2)
CO2: 23 mmol/L (ref 20–29)
Calcium: 9.7 mg/dL (ref 8.6–10.2)
Chloride: 103 mmol/L (ref 96–106)
Creatinine, Ser: 0.67 mg/dL — ABNORMAL LOW (ref 0.76–1.27)
Globulin, Total: 2.3 g/dL (ref 1.5–4.5)
Glucose: 94 mg/dL (ref 70–99)
Potassium: 3.5 mmol/L (ref 3.5–5.2)
Sodium: 139 mmol/L (ref 134–144)
Total Protein: 7.2 g/dL (ref 6.0–8.5)
eGFR: 106 mL/min/{1.73_m2} (ref >59)

## 2022-01-09 LAB — IGG, IGA, IGM
IgA/Immunoglobulin A, Serum: 103 mg/dL (ref 61–437)
IgG (Immunoglobin G), Serum: 816 mg/dL (ref 603–1613)
IgM (Immunoglobulin M), Srm: 38 mg/dL (ref 20–172)

## 2022-01-29 ENCOUNTER — Encounter: Payer: Self-pay | Admitting: *Deleted

## 2022-01-29 ENCOUNTER — Other Ambulatory Visit: Payer: Self-pay | Admitting: *Deleted

## 2022-01-29 MED ORDER — DIAZEPAM 5 MG PO TABS: 5.0000 mg | ORAL_TABLET | Freq: Three times a day (TID) | ORAL | 1 refills | Status: DC

## 2022-02-14 ENCOUNTER — Encounter: Payer: Self-pay | Admitting: Diagnostic Neuroimaging

## 2022-03-18 ENCOUNTER — Encounter: Payer: Self-pay | Admitting: Diagnostic Neuroimaging

## 2022-03-20 ENCOUNTER — Telehealth: Payer: Self-pay | Admitting: *Deleted

## 2022-03-20 ENCOUNTER — Other Ambulatory Visit: Payer: Self-pay | Admitting: *Deleted

## 2022-03-20 DIAGNOSIS — G35 Multiple sclerosis: Secondary | ICD-10-CM

## 2022-03-20 MED ORDER — DIMETHYL FUMARATE STARTER PACK 120 & 240 MG PO MISC: ORAL | 0 refills | Status: DC

## 2022-03-20 MED ORDER — DIMETHYL FUMARATE 240 MG PO CPDR: 1.0000 | DELAYED_RELEASE_CAPSULE | Freq: Two times a day (BID) | ORAL | 11 refills | Status: DC

## 2022-03-20 NOTE — Telephone Encounter (Signed)
Patient has agreed to start Dimethyl fumarate.  Rx sent [facility] as [facility].  Dimethyl fumarate PA, Key: [id number]. Received message:  This medication or product is on your plan's list of covered drugs. Prior authorization is not required at this time. If your pharmacy has questions regarding the processing of your prescription, please have them call the [facility] pharmacy help desk at [phone]. **Please note: This request was submitted electronically. Formulary lowering, tiering exception, cost reduction and/or pre-benefit determination review (including prospective Medicare hospice reviews) requests cannot be requested using this method of submission. Providers contact us at [phone] for further assistance.

## 2022-05-01 NOTE — Telephone Encounter (Signed)
FYI

## 2022-06-10 ENCOUNTER — Other Ambulatory Visit: Payer: Self-pay | Admitting: Diagnostic Neuroimaging

## 2022-07-06 ENCOUNTER — Other Ambulatory Visit: Payer: Self-pay | Admitting: Diagnostic Neuroimaging

## 2022-08-07 ENCOUNTER — Telehealth: Payer: Self-pay | Admitting: Diagnostic Neuroimaging

## 2022-08-07 NOTE — Telephone Encounter (Signed)
Pharmacist Rana from [facility]  reports that pt is only taking the diazepam (VALIUM) 5 MG tablet once a day at bed time.  Not once 3 times a day, she is asking for a new prescription to be sent to the pharmacy

## 2022-08-07 NOTE — Telephone Encounter (Signed)
Called and spoke with Rana, Pharmacist and she was just wanting to change the instructions from 3 5 mg tablets a day to one 5 mg tablet a day because that is what the patient has reported to the pharmacist. According to [place] drug registry, the prescription was filled on 11.10.23 with a 90 day supply so refill not needed at this time, will change instructions for future refill.

## 2022-10-08 ENCOUNTER — Encounter: Payer: Self-pay | Admitting: Diagnostic Neuroimaging

## 2022-11-07 ENCOUNTER — Other Ambulatory Visit: Payer: Self-pay | Admitting: Diagnostic Neuroimaging

## 2023-01-14 ENCOUNTER — Telehealth: Payer: Medicare Other | Admitting: Diagnostic Neuroimaging

## 2023-01-16 ENCOUNTER — Telehealth: Payer: Medicare Other | Admitting: Diagnostic Neuroimaging

## 2023-03-04 ENCOUNTER — Encounter: Payer: Self-pay | Admitting: Neurology

## 2023-03-04 ENCOUNTER — Encounter: Payer: Self-pay | Admitting: Diagnostic Neuroimaging

## 2023-03-04 ENCOUNTER — Telehealth (INDEPENDENT_AMBULATORY_CARE_PROVIDER_SITE_OTHER): Payer: Medicare Other | Admitting: Diagnostic Neuroimaging

## 2023-03-04 DIAGNOSIS — G35 Multiple sclerosis: Secondary | ICD-10-CM

## 2023-03-04 NOTE — Progress Notes (Signed)
[facility]  PATIENT: Samuel [name] DOB: 1960-10-04  REFERRING CLINICIAN: Annita Brod, MD HISTORY FROM: patient REASON FOR VISIT: follow up   HISTORICAL  CHIEF COMPLAINT:  Chief Complaint  Patient presents with   Multiple Sclerosis    HISTORY OF PRESENT ILLNESS:   UPDATE (03/04/23, VRP): Since last visit, doing about the same. Decided not to try tecfidera due to mild LFT changes over the past few years. Stable on other meds.  UPDATE (01/08/22, VRP): Since last visit, doing about the same.  Last Ocrevus in November 2022.  Reports gradual progression of MS symptoms for over 10 years in spite of Tysabri and Ocrevus use.  Other meds are being refilled by PCP.    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  He remains on Ocrevus, he receives his infusions at our office.  He indicates he is not able to discern any change, but reports overall slow progression.  He has a suprapubic catheter.  They are working with urology to establish with a home health agency who will be able to come out on a monthly basis to provide catheter care.  He remains in a wheelchair.  He has no use of the right arm or legs.  He has minimal use of the left arm. He is not able to feed himself.  His constipation has improved with the use of daily probiotic.  He says on average he will have a bowel movement every 3 days.  He lives with his sister, [name] who is his primary caregiver.  He indicates his headaches have improved while taking Topamax.  He is planning to have several teeth extracted, but the procedure had to be pushed to January, because they didn't want it to occur too close to his Ocrevus infusion.  He does receive a Maanav for the Ocrevus medication.  His next infusion is scheduled for November 23, which is a little late, to allow time for paperwork with the [employer] funding.  He is prescribed tramadol to take on  days where he travels out of the home or if he has a catheter change.  It is becoming increasingly more difficult for him to be taken out of the home.  From this office he is prescribed Abilify, baclofen, Wellbutrin, Valium, Remeron, tizanidine, Topamax, and tramadol.  He presents today for follow-up accompanied by his sister.   HISTORY 05/19/2018 Dr. 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  The patient is on Ocrevus, his next dose is in early October 2019.  The patient was seen in the hospital on 20 April 2018 with a urinary tract infection.  The patient is followed through urology, he wishes to switch physicians, however.  The patient has been suffering from increased problems with depression, he is having frequent crying spells.  The patient was recently placed on Wellbutrin, he has been on the 300 mg dose for about 1 week.  He takes perphenazine 2 mg twice daily.  The patient is having some problems with headaches that are occurring at least twice a week, the headaches are around the right eye, they are responsive to Advil.  The patient has no use of his arms and legs at this point, he has to be fed.  The patient also has problems with temperature instability, he feels hot at one point and then cold the next, he cannot  seem to get his body temperature right.  The patient overall is relatively miserable, his family is helping to take care of him.  He is concerned about his financial situation.   UPDATE 12/14/14 Dr. Terrace Arabia: Diagnosis of MS was in 1993, he presented with right optic neuritis in 1986, only had partial recovery, later on, he had relapsing episode, presented with progressive working difficulty, urinary urgency, retention, rapid worsening since 2002, eventually become wheelchair bound in 2014, he used to operate his wheelchair using his right hand, which has gradual worsening spasticity, loosing function,  now he has to reach over with his left hand, to operate his wheelchair, which is getting more difficulty, because increased left hand muscle spasm, and weakness,   PRIOR HPI 11/14/14 Samuel [name]: Samuel [name] is a 62 year old right-handed white male with a history of multiple sclerosis with a spastic quadriparesis. The patient has chronic progressive multiple sclerosis, recently he has come off of Tysabri. He indicates that he developed rapid onset of dysfunction of the left arm and hand in December 2015, and he stopped the Tysabri after that point. The patient has had increased discomfort in the lower extremities off of Tysabri. He has not had any overt MS attacks, however. The patient is having ongoing spasticity in the legs at night associated with some painful spasms of the legs and bladder spasms. The patient has had a suprapubic catheter placement. He has a caretaker that helps maintain him in the home environment. The patient is not ambulatory, he has to be transferred from the bed to the chair using a Hoyer lift. The patient reports that he has heat and sweats of his lower body at night, coldness and chills in his upper body at night. The spasm of the legs continues to be a problem. He is having some issues with anxiety as well in the evening hours. He is on clonazepam taking 1 mg twice daily, he has diazepam 10 mg take at night if needed. He returns to this office for an evaluation.    REVIEW OF SYSTEMS: Full 14 system review of systems performed and negative with exception of: as per HPI.  ALLERGIES: Allergies  Allergen Reactions   Penicillins Other (See Comments)    > 20 yr ago, doesn't recall nature of reaction (didn't require hospitalization) Tolerates Ancef, Rocephin, and Cefepime per chart review Does not recall having taken amox/amp since initial rxn; none documented in Epic    Primaxin [Imipenem] Other (See Comments)    Seizure     HOME MEDICATIONS: Outpatient Medications Prior to  Visit  Medication Sig Dispense Refill   acetic acid 0.25 % irrigation Irrigate with as directed daily. Instill 30ml and clamp tube for 30 minutes then drain. 500 mL 12   ARIPiprazole (ABILIFY) 5 MG tablet Take 1 tablet (5 mg total) by mouth daily. 90 tablet 1   AZO-CRANBERRY PO Take 1 tablet by mouth 2 (two) times daily.     baclofen (LIORESAL) 20 MG tablet TAKE TWO TABLETS BY MOUTH THREE TIMES A Day 540 tablet 1   buPROPion (WELLBUTRIN XL) 150 MG 24 hr tablet Take 2 tablets (300 mg total) by mouth daily. Must make follow up for further refills. 180 tablet 0   clotrimazole-betamethasone (LOTRISONE) cream Apply 1 application topically 2 (two) times daily.     diazepam (VALIUM) 5 MG tablet TAKE ONE TABLET BY MOUTH THREE TIMES A Day 270 tablet 1   Dimethyl Fumarate 240 MG CPDR Take 1 capsule (240 mg total) by mouth  in the morning and at bedtime. 60 capsule 11   Dimethyl Fumarate Starter Pack 120 & 240 MG MISC 120 mg twice daily x 7 days then 240 mg twice daily x 23 days 60 each 0   Hydrocortisone (GERHARDT'S BUTT CREAM) CREA Apply 1 application topically 3 (three) times daily. 1 each 0   levothyroxine (SYNTHROID, LEVOTHROID) 125 MCG tablet Take 125 mcg by mouth daily.     lisinopril-hydrochlorothiazide (PRINZIDE,ZESTORETIC) 10-12.5 MG tablet Take 0.5 tablets by mouth daily.     liver oil-zinc oxide (DESITIN) 40 % ointment Apply 1 application topically as needed for irritation.     magnesium citrate SOLN Take 0.5 Bottles by mouth daily as needed for moderate constipation or severe constipation.     mirtazapine (REMERON) 30 MG tablet Take 1 tablet (30 mg total) by mouth at bedtime. 90 tablet 1   MYRBETRIQ 50 MG TB24 tablet      omeprazole (PRILOSEC) 20 MG capsule Take 20 mg by mouth every morning.      polyethylene glycol (MIRALAX / GLYCOLAX) packet Take 17 g by mouth daily. (Patient not taking: Reported on 01/08/2022) 14 each 0   Probiotic Product (PROBIOTIC-10 PO) Take by mouth daily.     tiZANidine  (ZANAFLEX) 4 MG tablet TAKE 2 TABLETS BY MOUTH 3 TIMES A DAY 540 tablet 0   topiramate (TOPAMAX) 25 MG tablet TAKE 2 TABLETS BY MOUTH AT BEDTIME 180 tablet 1   traMADol (ULTRAM) 50 MG tablet TAKE ONE TABLET BY MOUTH EVERY 6 HOURS AS NEEDED 60 tablet 0   Vitamin D, Ergocalciferol, (DRISDOL) 50000 UNITS CAPS Take 50,000 Units by mouth every 7 (seven) days. Friday only     Vitamins A & D (VITAMIN A & D) ointment Apply 1 application topically as needed for dry skin.     Facility-Administered Medications Prior to Visit  Medication Dose Route Frequency Provider Last Rate Last Admin   botulinum toxin Type A (BOTOX) injection 300 Units  300 Units Intramuscular Once Levert Feinstein, MD        PAST MEDICAL HISTORY: Past Medical History:  Diagnosis Date   Anxiety    Bladder calculi    Chronic back pain    Chronic fatigue    Depression    Foley catheter in place    Gait disorder    GERD (gastroesophageal reflux disease)    History of kidney stones    Hyperlipidemia    Hypertension    Hypothyroidism    Incontinence of urine    Multiple sclerosis (HCC) NEUROLOGIST-- [name]   dx 1993, JC virus negative 2/14---  CURRENTLY RECEIVING TYSABRI IV TREATMENT   Neurogenic bladder    Neurogenic bowel    intermittant constipation and diarrhea   Pneumonia    dx  03-03-2014--  admitted for iv and oral antibiotics   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   Urinary retention    Vitamin D deficiency    Yeast infection    genital area    PAST SURGICAL HISTORY: Past Surgical History:  Procedure Laterality Date   APPENDECTOMY  age 69   CYSTOSCOPY N/A 03/21/2014   Procedure: CYSTOSCOPY TREATMENT OF BLADDER CALCULI;  Surgeon: [name], MD;  Location[facility];  Service: Urology;  Laterality: N/A;   CYSTOSCOPY WITH BIOPSY N/A 12/29/2017   Procedure: TRANSURETHRAL RESECTION OF BLADDER TUMOR;  Surgeon: [name], MD;  Location: [facility];  Service: Urology;  Laterality: N/A;    CYSTOSCOPY WITH RETROGRADE PYELOGRAM, URETEROSCOPY AND  STENT PLACEMENT Right 12/11/2017   Procedure: CYSTOSCOPY WITH RETROGRADE PYELOGRAM, URETEROSCOPY AND STENT PLACEMENT RIGHT;  Surgeon: [name], MD;  Location: [facility];  Service: Urology;  Laterality: Right;   CYSTOSCOPY WITH RETROGRADE PYELOGRAM, URETEROSCOPY AND STENT PLACEMENT Right 12/29/2017   Procedure: CYSTOSCOPY WITH RIGHT  RETROGRADE PYELOGRAM, URETEROSCOPY/LASER LITHOTRIPSY AND STENT PLACEMENT;  Surgeon: [name], MD;  Location: [facility];  Service: Urology;  Laterality: Right;   INSERTION OF SUPRAPUBIC CATHETER N/A 03/21/2014   Procedure: INSERTION OF SUPRAPUBIC CATHETER;  Surgeon: [name], MD;  Location: [facility];  Service: Urology;  Laterality: N/A;   NASAL SEPTUM SURGERY  1996   NEGATIVE SLEEP STUDY  1996   TRANSTHORACIC ECHOCARDIOGRAM  02-07-2004   MILD LVH/  EF 55-65%    FAMILY HISTORY: Family History  Problem Relation Age of Onset   Stroke Father    Hypothyroidism Sister    Cirrhosis Brother     SOCIAL HISTORY: Social History   Socioeconomic History   Marital status: Divorced    Spouse name: Not on file   Number of children: 0   Years of education: college   Highest education level: Not on file  Occupational History   Occupation: Curator    Comment: disability  Tobacco Use   Smoking status: Former    Packs/day: 1.50    Years: 8.00    Additional pack years: 0.00    Total pack years: 12.00    Types: Cigarettes    Quit date: 06/17/1985    Years since quitting: 37.7   Smokeless tobacco: Never  Vaping Use   Vaping Use: Some days  Substance and Sexual Activity   Alcohol use: No    Alcohol/week: 0.0 standard drinks of alcohol   Drug use: No   Sexual activity: Not on file  Other Topics Concern   Not on file  Social History Narrative   Patient is right handed.   Patient does not drink caffeine.      [facility]:   01/08/22 Patient currently lives with  sister. He has 2 caregivers who help care for him in his own home.     Social Determinants of Health   Financial Resource Strain: Not on file  Food Insecurity: Not on file  Transportation Needs: Not on file  Physical Activity: Not on file  Stress: Not on file  Social Connections: Not on file  Intimate Partner Violence: Not on file     PHYSICAL EXAM  GENERAL EXAM/CONSTITUTIONAL: Vitals:  There were no vitals filed for this visit.  There is no height or weight on file to calculate BMI. Wt Readings from Last 3 Encounters:  06/23/19 160 lb (72.6 kg)  09/17/18 145 lb 8.1 oz (66 kg)  04/21/18 153 lb 10.6 oz (69.7 kg)   Patient is in no distress; well developed, nourished and groomed; neck is supple  CARDIOVASCULAR: Examination of carotid arteries is normal; no carotid bruits Regular rate and rhythm, no murmurs Examination of peripheral vascular system by observation and palpation is normal  EYES: Ophthalmoscopic exam of optic discs and posterior segments is normal; no papilledema or hemorrhages No results found.  MUSCULOSKELETAL: Gait, strength, tone, movements noted in Neurologic exam below  NEUROLOGIC: MENTAL STATUS:      No data to display         awake, alert, oriented to person, place and time recent and remote memory intact normal attention and concentration language fluent, comprehension intact, naming intact fund of knowledge appropriate  CRANIAL NERVE:  2nd - no papilledema on fundoscopic exam 2nd, 3rd, 4th, 6th - pupils equal and reactive to light, visual fields full to confrontation, extraocular muscles --> BILATERAL INO WITH NYSTAGMUS; DECR EYE BROW RAISE 5th - facial sensation symmetric 7th - facial strength symmetric 8th - hearing intact 9th - palate elevates symmetrically, uvula midline 11th - shoulder shrug symmetric 12th - tongue protrusion midline MILD DYSARTHRIA  MOTOR:  SPASTIC QUADRIPARESIS; INCREASED TONE BUE AND BLE; R > L RUE AND  RLE 0 LUE 2 PROX, 0 DISTAL LLE 0  SENSORY:  normal and symmetric to light touch  COORDINATION:  finger-nose-finger, fine finger movements LIMITED BY WEAKNESS  REFLEXES:  deep tendon reflexes INCREASED IN BUE; TRACE IN BLE  GAIT/STATION:  IN POWER CHAIR     DIAGNOSTIC DATA (LABS, IMAGING, TESTING) - I reviewed patient records, labs, notes, testing and imaging myself where available.  Lab Results  Component Value Date   WBC 9.1 01/08/2022   HGB 16.7 01/08/2022   HCT 50.0 01/08/2022   MCV 88 01/08/2022   PLT 236 01/08/2022      Component Value Date/Time   NA 139 01/08/2022 1641   K 3.5 01/08/2022 1641   CL 103 01/08/2022 1641   CO2 23 01/08/2022 1641   GLUCOSE 94 01/08/2022 1641   GLUCOSE 110 (H) 09/18/2018 0325   BUN 9 01/08/2022 1641   CREATININE 0.67 (L) 01/08/2022 1641   CALCIUM 9.7 01/08/2022 1641   PROT 7.2 01/08/2022 1641   ALBUMIN 4.9 (H) 01/08/2022 1641   AST 26 01/08/2022 1641   ALT 51 (H) 01/08/2022 1641   ALKPHOS 181 (H) 01/08/2022 1641   BILITOT 0.4 01/08/2022 1641   GFRNONAA 102 06/23/2019 1357   GFRAA 118 06/23/2019 1357   No results found for: "CHOL", "HDL", "LDLCALC", "LDLDIRECT", "TRIG", "CHOLHDL" No results found for: "HGBA1C" No results found for: "VITAMINB12" Lab Results  Component Value Date   TSH 7.893 (H) 09/17/2018    12/02/13 MRI brain - Abnormal MR scan of the brain showing scattered  periventricular and subcortical and corpus callosum white matter  hyperintensities consistent with patient's known diagnosis of multiple  sclerosis. No enhancing lesions are noted. No significant change compared  to similar dated 02/08/2013.   ASSESSMENT AND PLAN  62 y.o. year old male here with secondary progressive multiple sclerosis, symptom onset 60.  Has been on Betaseron, Avonex, Tysabri and Ocrevus.   Dx:  1. Multiple sclerosis (HCC)       PLAN:  SECONDARY PROGRESSIVE MULTIPLE SCLEROSIS (symptom onset 1986; has been on  Betaseron, Avonex, Tysabri and Ocrevus) - continue supportive care - continue current meds (per PCP)  Meds that are now being rx'd by PCP: - tramadol - buproprion - tizanidine - diazepam - mirtazapine - aripiprazole - bacolfen - topiramate  Return for return to PCP, pending if symptoms worsen or fail to improve.  Virtual Visit via Video Note  I connected with Jaggar Zaborski Grainger on 03/04/23 at  3:00 PM EDT by a video enabled telemedicine application and verified that I am speaking with the correct person using two identifiers.   I discussed the limitations of evaluation and management by telemedicine and the availability of in person appointments. The patient expressed understanding and agreed to proceed.  Patient is at home and I am at the office.   I spent 15 minutes of face-to-face and non-face-to-face time with patient.  This included previsit chart review, lab review, study review, order entry, electronic health record documentation,  patient education.      Suanne Marker, MD 03/04/2023, 3:15 PM Certified in Neurology, Neurophysiology and Neuroimaging  [facility] [address] [place], [place] [address] [phone]

## 2023-03-24 ENCOUNTER — Other Ambulatory Visit: Payer: Self-pay

## 2023-03-24 ENCOUNTER — Emergency Department (HOSPITAL_COMMUNITY): Payer: Medicare Other

## 2023-03-24 ENCOUNTER — Emergency Department (HOSPITAL_COMMUNITY)
Admission: EM | Admit: 2023-03-24 | Discharge: 2023-03-25 | Disposition: A | Discharge status: Home / Self Care | Payer: Medicare Other | Attending: Emergency Medicine | Admitting: Emergency Medicine

## 2023-03-24 DIAGNOSIS — R0981 Nasal congestion: Secondary | ICD-10-CM | POA: Diagnosis present

## 2023-03-24 DIAGNOSIS — R000 Tachycardia, unspecified: Secondary | ICD-10-CM | POA: Diagnosis not present

## 2023-03-24 DIAGNOSIS — Z79899 Other long term (current) drug therapy: Secondary | ICD-10-CM | POA: Diagnosis not present

## 2023-03-24 DIAGNOSIS — E039 Hypothyroidism, unspecified: Secondary | ICD-10-CM | POA: Diagnosis not present

## 2023-03-24 DIAGNOSIS — U071 COVID-19: Secondary | ICD-10-CM | POA: Diagnosis not present

## 2023-03-24 DIAGNOSIS — J069 Acute upper respiratory infection, unspecified: Secondary | ICD-10-CM

## 2023-03-24 DIAGNOSIS — I10 Essential (primary) hypertension: Secondary | ICD-10-CM | POA: Insufficient documentation

## 2023-03-24 LAB — RESP PANEL BY RT-PCR (RSV, FLU A&B, COVID)  RVPGX2
Influenza A by PCR: NEGATIVE
Influenza B by PCR: NEGATIVE
Resp Syncytial Virus by PCR: NEGATIVE
SARS Coronavirus 2 by RT PCR: POSITIVE — AB

## 2023-03-24 NOTE — ED Triage Notes (Signed)
Pt BIBA from home. EMS called out for high BP. Sys was 200 when EMS arrived. Pt has no complaints  AOX4.  Hx of MS, pt bed bound

## 2023-03-24 NOTE — ED Provider Notes (Addendum)
[place] EMERGENCY DEPARTMENT AT [facility] Provider Note   CSN: [id number] Arrival date & time: 03/24/23  1806     History  Chief Complaint  Patient presents with   Hypertension    Samuel [name] is a 62 y.o. male.   Hypertension  Patient presented with high blood pressure.  Reportedly had Foley catheter changed today and found to have a blood pressure of 200/100.  Sent in for urgent reduction of it.  Patient states he feels fine.  No complaints of chest pain or dizziness.  States he has had cough.  Has had nasal congestion.  Has been taking cough medicine however.    Past Medical History:  Diagnosis Date   Anxiety    Bladder calculi    Chronic back pain    Chronic fatigue    Depression    Foley catheter in place    Gait disorder    GERD (gastroesophageal reflux disease)    History of kidney stones    Hyperlipidemia    Hypertension    Hypothyroidism    Incontinence of urine    Multiple sclerosis (HCC) NEUROLOGIST-- [name]   dx 1993, JC virus negative 2/14---  CURRENTLY RECEIVING TYSABRI IV TREATMENT   Neurogenic bladder    Neurogenic bowel    intermittant constipation and diarrhea   Pneumonia    dx  03-03-2014--  admitted for iv and oral antibiotics   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 Authorizing Provider  acetic acid 0.25 % irrigation Irrigate with as directed daily. Instill 30ml and clamp tube for 30 minutes then drain. 06/03/15   [name], MD  ARIPiprazole (ABILIFY) 5 MG tablet Take 5 mg by mouth daily.    [provider]  AZO-CRANBERRY PO Take 1 tablet by mouth 2 (two) times daily.    [provider]  baclofen (LIORESAL) 20 MG tablet Take 40 mg by mouth 3 (three) times daily.    [provider]  buPROPion (WELLBUTRIN XL) 150 MG 24  hr tablet Take 300 mg by mouth daily.    [provider]  clotrimazole-betamethasone (LOTRISONE) cream Apply 1 application topically 2 (two) times daily. 09/03/18   [provider]  diazepam (VALIUM) 5 MG tablet Take 5 mg by mouth 3 (three) times daily.    [provider]  Hydrocortisone (GERHARDT'S BUTT CREAM) CREA Apply 1 application topically 3 (three) times daily. 04/23/18   [name], MD  levothyroxine (SYNTHROID, LEVOTHROID) 125 MCG tablet Take 125 mcg by mouth daily. 05/22/15   [provider]  lisinopril-hydrochlorothiazide (PRINZIDE,ZESTORETIC) 10-12.5 MG tablet Take 0.5 tablets by mouth daily. 04/23/18   [name], MD  liver oil-zinc oxide (DESITIN) 40 % ointment Apply 1 application topically as needed for irritation.    [provider]  magnesium citrate SOLN Take 0.5 Bottles by mouth daily as needed for moderate constipation or severe constipation.    [provider]  mirtazapine (REMERON) 30 MG tablet Take 1 tablet (30 mg total) by mouth at bedtime. 05/01/21   York Spaniel, MD  mirtazapine (REMERON) 30 MG tablet Take 30 mg by mouth at bedtime.    [provider]  MYRBETRIQ 50 MG TB24 tablet  02/19/19   [provider]  omeprazole (PRILOSEC) 20 MG  capsule Take 20 mg by mouth every morning.     [provider]  polyethylene glycol (MIRALAX / GLYCOLAX) packet Take 17 g by mouth daily. 04/24/18   [name], MD  Probiotic Product (PROBIOTIC-10 PO) Take by mouth daily.    [provider]  tiZANidine (ZANAFLEX) 4 MG capsule Take 8 mg by mouth 3 (three) times daily.    [provider]  topiramate (TOPAMAX) 50 MG tablet Take 50 mg by mouth at bedtime.    [provider]  traMADol (ULTRAM) 50 MG tablet Take 50 mg by mouth every 6 (six) hours as needed for moderate pain.    [provider]  Vitamin D, Ergocalciferol, (DRISDOL) 50000 UNITS CAPS Take 50,000 Units by  mouth every 7 (seven) days. Friday only    [provider]  Vitamins A & D (VITAMIN A & D) ointment Apply 1 application topically as needed for dry skin.    [provider]      Allergies    Penicillins and Primaxin [imipenem]    Review of Systems   Review of Systems  Physical Exam Updated Vital Signs BP (!) 145/76   Pulse 94   Temp 99.3 F (37.4 C) (Oral)   Resp 16   Ht 6' (1.829 m)   Wt 77.1 kg   SpO2 96%   BMI 23.06 kg/m  Physical Exam Vitals and nursing note reviewed.  Cardiovascular:     Rate and Rhythm: Normal rate.  Pulmonary:     Breath sounds: No wheezing or rhonchi.  Abdominal:     Tenderness: There is no abdominal tenderness.  Neurological:     Mental Status: He is alert.     Comments: Chronically weak due to MS.     ED Results / Procedures / Treatments   Labs (all labs ordered are listed, but only abnormal results are displayed) Labs Reviewed  RESP PANEL BY RT-PCR (RSV, FLU A&B, COVID)  [id number] - Abnormal; Notable for the following components:      Result Value   SARS Coronavirus 2 by RT PCR POSITIVE (*)    All other components within normal limits    EKG EKG Interpretation Date/Time:  Monday March 24 2023 18:11:05 EDT Ventricular Rate:  105 PR Interval:  145 QRS Duration:  86 QT Interval:  320 QTC Calculation: 423 R Axis:   58  Text Interpretation: Sinus tachycardia Borderline repolarization abnormality Confirmed by Benjiman Core [phone]) on 03/24/2023 7:29:51 PM  Radiology DG Chest Portable 1 View  Result Date: 03/24/2023 CLINICAL DATA:  Cough EXAM: PORTABLE CHEST 1 VIEW COMPARISON:  09/16/2018 FINDINGS: Heart and mediastinal contours are within normal limits. No focal opacities or effusions. No acute bony abnormality. IMPRESSION: No active disease. Electronically Signed   By: Charlett Nose M.D.   On: 03/24/2023 19:21    Procedures Procedures    Medications Ordered in ED Medications - No data to display  ED Course/  Medical Decision Making/ A&P                             Medical Decision Making Amount and/or Complexity of Data Reviewed Radiology: ordered.   Patient with hypertension.  Asymptomatic.  Sent in.  Had been taking his blood pressure medicines but also had cough medicine that could have raised it.  Do not see any end organ damage at this time.  In fact blood pressure is decreased on his own down to  x 1 teens over 70.  Chest x-ray done does not show pneumonia.  Discussed with family members and they requested RSV and COVID testing.  Has had around 2 weeks of symptoms however.  Will still check but does not need urgent treatment.  Will discharge home to follow-up as an outpatient.  Will follow blood pressure at home but does not need acute treatment at this time.  Of note COVID test did later come back positive.  However has had 2 weeks of symptoms and does not need treatment at this time.  Will discharge home.        Final Clinical Impression(s) / ED Diagnoses Final diagnoses:  Hypertension, unspecified type  Upper respiratory tract infection, unspecified type    Rx / DC Orders ED Discharge Orders     None         Benjiman Core, MD 03/24/23 Andy Gauss, MD 03/24/23 2141

## 2023-06-01 ENCOUNTER — Other Ambulatory Visit: Payer: Self-pay

## 2023-06-01 ENCOUNTER — Inpatient Hospital Stay (HOSPITAL_COMMUNITY): Payer: Medicare Other

## 2023-06-01 ENCOUNTER — Inpatient Hospital Stay (HOSPITAL_COMMUNITY)
Admission: EM | Admit: 2023-06-01 | Discharge: 2023-06-06 | DRG: 640 | Disposition: A | Discharge status: Home / Self Care | Payer: Medicare Other | Attending: Internal Medicine | Admitting: Internal Medicine

## 2023-06-01 ENCOUNTER — Emergency Department (HOSPITAL_COMMUNITY): Payer: Medicare Other

## 2023-06-01 ENCOUNTER — Encounter (HOSPITAL_COMMUNITY): Payer: Self-pay

## 2023-06-01 DIAGNOSIS — F32A Depression, unspecified: Secondary | ICD-10-CM | POA: Diagnosis present

## 2023-06-01 DIAGNOSIS — Z6821 Body mass index (BMI) 21.0-21.9, adult: Secondary | ICD-10-CM

## 2023-06-01 DIAGNOSIS — G8250 Quadriplegia, unspecified: Secondary | ICD-10-CM | POA: Diagnosis present

## 2023-06-01 DIAGNOSIS — Z936 Other artificial openings of urinary tract status: Secondary | ICD-10-CM

## 2023-06-01 DIAGNOSIS — Z1152 Encounter for screening for COVID-19: Secondary | ICD-10-CM

## 2023-06-01 DIAGNOSIS — R627 Adult failure to thrive: Secondary | ICD-10-CM | POA: Diagnosis present

## 2023-06-01 DIAGNOSIS — K5909 Other constipation: Secondary | ICD-10-CM | POA: Diagnosis present

## 2023-06-01 DIAGNOSIS — R3589 Other polyuria: Secondary | ICD-10-CM | POA: Diagnosis not present

## 2023-06-01 DIAGNOSIS — E876 Hypokalemia: Secondary | ICD-10-CM | POA: Diagnosis present

## 2023-06-01 DIAGNOSIS — K592 Neurogenic bowel, not elsewhere classified: Secondary | ICD-10-CM | POA: Diagnosis present

## 2023-06-01 DIAGNOSIS — E861 Hypovolemia: Secondary | ICD-10-CM | POA: Diagnosis present

## 2023-06-01 DIAGNOSIS — E785 Hyperlipidemia, unspecified: Secondary | ICD-10-CM | POA: Diagnosis present

## 2023-06-01 DIAGNOSIS — Z8349 Family history of other endocrine, nutritional and metabolic diseases: Secondary | ICD-10-CM

## 2023-06-01 DIAGNOSIS — G8929 Other chronic pain: Secondary | ICD-10-CM | POA: Diagnosis present

## 2023-06-01 DIAGNOSIS — Z888 Allergy status to other drugs, medicaments and biological substances status: Secondary | ICD-10-CM

## 2023-06-01 DIAGNOSIS — Z79890 Hormone replacement therapy (postmenopausal): Secondary | ICD-10-CM

## 2023-06-01 DIAGNOSIS — F419 Anxiety disorder, unspecified: Secondary | ICD-10-CM | POA: Diagnosis present

## 2023-06-01 DIAGNOSIS — R112 Nausea with vomiting, unspecified: Secondary | ICD-10-CM | POA: Diagnosis present

## 2023-06-01 DIAGNOSIS — G35 Multiple sclerosis: Secondary | ICD-10-CM | POA: Diagnosis present

## 2023-06-01 DIAGNOSIS — Z79899 Other long term (current) drug therapy: Secondary | ICD-10-CM

## 2023-06-01 DIAGNOSIS — R5382 Chronic fatigue, unspecified: Secondary | ICD-10-CM | POA: Diagnosis present

## 2023-06-01 DIAGNOSIS — I10 Essential (primary) hypertension: Secondary | ICD-10-CM | POA: Diagnosis present

## 2023-06-01 DIAGNOSIS — D72829 Elevated white blood cell count, unspecified: Secondary | ICD-10-CM | POA: Diagnosis present

## 2023-06-01 DIAGNOSIS — Z87891 Personal history of nicotine dependence: Secondary | ICD-10-CM

## 2023-06-01 DIAGNOSIS — E039 Hypothyroidism, unspecified: Secondary | ICD-10-CM | POA: Diagnosis present

## 2023-06-01 DIAGNOSIS — N319 Neuromuscular dysfunction of bladder, unspecified: Secondary | ICD-10-CM | POA: Diagnosis present

## 2023-06-01 DIAGNOSIS — G40909 Epilepsy, unspecified, not intractable, without status epilepticus: Secondary | ICD-10-CM | POA: Diagnosis present

## 2023-06-01 DIAGNOSIS — R8281 Pyuria: Secondary | ICD-10-CM | POA: Diagnosis present

## 2023-06-01 DIAGNOSIS — K219 Gastro-esophageal reflux disease without esophagitis: Secondary | ICD-10-CM | POA: Diagnosis present

## 2023-06-01 DIAGNOSIS — E871 Hypo-osmolality and hyponatremia: Secondary | ICD-10-CM | POA: Diagnosis not present

## 2023-06-01 DIAGNOSIS — Z66 Do not resuscitate: Secondary | ICD-10-CM | POA: Diagnosis present

## 2023-06-01 DIAGNOSIS — E559 Vitamin D deficiency, unspecified: Secondary | ICD-10-CM | POA: Diagnosis present

## 2023-06-01 DIAGNOSIS — T502X5A Adverse effect of carbonic-anhydrase inhibitors, benzothiadiazides and other diuretics, initial encounter: Secondary | ICD-10-CM | POA: Diagnosis present

## 2023-06-01 DIAGNOSIS — Z880 Allergy status to penicillin: Secondary | ICD-10-CM

## 2023-06-01 DIAGNOSIS — I959 Hypotension, unspecified: Secondary | ICD-10-CM | POA: Diagnosis not present

## 2023-06-01 DIAGNOSIS — Z823 Family history of stroke: Secondary | ICD-10-CM

## 2023-06-01 DIAGNOSIS — Z7401 Bed confinement status: Secondary | ICD-10-CM

## 2023-06-01 DIAGNOSIS — Z9049 Acquired absence of other specified parts of digestive tract: Secondary | ICD-10-CM

## 2023-06-01 DIAGNOSIS — M549 Dorsalgia, unspecified: Secondary | ICD-10-CM | POA: Diagnosis present

## 2023-06-01 DIAGNOSIS — Z87442 Personal history of urinary calculi: Secondary | ICD-10-CM

## 2023-06-01 LAB — URINALYSIS, ROUTINE W REFLEX MICROSCOPIC
Bilirubin Urine: NEGATIVE
Glucose, UA: >=500 mg/dL — AB
Ketones, ur: 80 mg/dL — AB
Nitrite: NEGATIVE
Protein, ur: 100 mg/dL — AB
Specific Gravity, Urine: 1.013 (ref 1.005–1.030)
WBC, UA: >50 WBC/hpf (ref 0–5)
pH: 5 (ref 5.0–8.0)

## 2023-06-01 LAB — COMPREHENSIVE METABOLIC PANEL
ALT: 31 U/L (ref 0–44)
AST: 33 U/L (ref 15–41)
Albumin: 5.1 g/dL — ABNORMAL HIGH (ref 3.5–5.0)
Alkaline Phosphatase: 157 U/L — ABNORMAL HIGH (ref 38–126)
BUN: 23 mg/dL (ref 8–23)
CO2: 26 mmol/L (ref 22–32)
Calcium: 9.2 mg/dL (ref 8.9–10.3)
Chloride: <65 mmol/L — CL (ref 98–111)
Creatinine, Ser: 0.97 mg/dL (ref 0.61–1.24)
GFR, Estimated: >60 mL/min (ref >60)
Glucose, Bld: 109 mg/dL — ABNORMAL HIGH (ref 70–99)
Potassium: 2.5 mmol/L — CL (ref 3.5–5.1)
Sodium: 108 mmol/L — CL (ref 135–145)
Total Bilirubin: 1.6 mg/dL — ABNORMAL HIGH (ref 0.3–1.2)
Total Protein: 8.8 g/dL — ABNORMAL HIGH (ref 6.5–8.1)

## 2023-06-01 LAB — CBC
HCT: 45.7 % (ref 39.0–52.0)
Hemoglobin: 16.5 g/dL (ref 13.0–17.0)
MCH: 28.7 pg (ref 26.0–34.0)
MCHC: 36.1 g/dL — ABNORMAL HIGH (ref 30.0–36.0)
MCV: 79.5 fL — ABNORMAL LOW (ref 80.0–100.0)
Platelets: 259 10*3/uL (ref 150–400)
RBC: 5.75 MIL/uL (ref 4.22–5.81)
RDW: 12.2 % (ref 11.5–15.5)
WBC: 15 10*3/uL — ABNORMAL HIGH (ref 4.0–10.5)
nRBC: 0 % (ref 0.0–0.2)

## 2023-06-01 LAB — CBC WITH DIFFERENTIAL/PLATELET
Abs Immature Granulocytes: 0.17 10*3/uL — ABNORMAL HIGH (ref 0.00–0.07)
Basophils Absolute: 0.1 10*3/uL (ref 0.0–0.1)
Basophils Relative: 0 %
Eosinophils Absolute: 0.1 10*3/uL (ref 0.0–0.5)
Eosinophils Relative: 0 %
HCT: 46 % (ref 39.0–52.0)
Hemoglobin: 17 g/dL (ref 13.0–17.0)
Immature Granulocytes: 1 %
Lymphocytes Relative: 4 %
Lymphs Abs: 0.6 10*3/uL — ABNORMAL LOW (ref 0.7–4.0)
MCH: 28.7 pg (ref 26.0–34.0)
MCHC: 37 g/dL — ABNORMAL HIGH (ref 30.0–36.0)
MCV: 77.6 fL — ABNORMAL LOW (ref 80.0–100.0)
Monocytes Absolute: 1.1 10*3/uL — ABNORMAL HIGH (ref 0.1–1.0)
Monocytes Relative: 8 %
Neutro Abs: 12.8 10*3/uL — ABNORMAL HIGH (ref 1.7–7.7)
Neutrophils Relative %: 87 %
Platelets: 291 10*3/uL (ref 150–400)
RBC: 5.93 MIL/uL — ABNORMAL HIGH (ref 4.22–5.81)
RDW: 12 % (ref 11.5–15.5)
WBC: 14.8 10*3/uL — ABNORMAL HIGH (ref 4.0–10.5)
nRBC: 0 % (ref 0.0–0.2)

## 2023-06-01 LAB — I-STAT CG4 LACTIC ACID, ED: Lactic Acid, Venous: 1.5 mmol/L (ref 0.5–1.9)

## 2023-06-01 LAB — MAGNESIUM: Magnesium: 2.3 mg/dL (ref 1.7–2.4)

## 2023-06-01 LAB — LIPASE, BLOOD: Lipase: 34 U/L (ref 11–51)

## 2023-06-01 LAB — OSMOLALITY, URINE: Osmolality, Ur: 448 mosm/kg (ref 300–900)

## 2023-06-01 LAB — SODIUM, URINE, RANDOM: Sodium, Ur: 13 mmol/L

## 2023-06-01 LAB — SODIUM
Sodium: 109 mmol/L — CL (ref 135–145)
Sodium: 112 mmol/L — CL (ref 135–145)

## 2023-06-01 LAB — OSMOLALITY: Osmolality: 252 mosm/kg — ABNORMAL LOW (ref 275–295)

## 2023-06-01 MED ORDER — DOCUSATE SODIUM 100 MG PO CAPS
100.0000 mg | ORAL_CAPSULE | Freq: Two times a day (BID) | ORAL | Status: DC | PRN
  Administered 2023-06-05: 100 mg via ORAL
  Filled 2023-06-01: qty 1

## 2023-06-01 MED ORDER — PANTOPRAZOLE SODIUM 40 MG PO TBEC
40.0000 mg | DELAYED_RELEASE_TABLET | Freq: Every day | ORAL | Status: DC
  Administered 2023-06-02 – 2023-06-06 (×5): 40 mg via ORAL
  Filled 2023-06-01 (×5): qty 1

## 2023-06-01 MED ORDER — ONDANSETRON HCL 4 MG/2ML IJ SOLN
INTRAMUSCULAR | Status: AC
  Administered 2023-06-01: 4 mg via INTRAVENOUS
  Filled 2023-06-01: qty 2

## 2023-06-01 MED ORDER — MIDAZOLAM HCL 2 MG/2ML IJ SOLN
INTRAMUSCULAR | Status: AC
  Filled 2023-06-01: qty 4

## 2023-06-01 MED ORDER — POTASSIUM CHLORIDE 10 MEQ/50ML IV SOLN
10.0000 meq | INTRAVENOUS | Status: AC
  Administered 2023-06-01 – 2023-06-02 (×4): 10 meq via INTRAVENOUS
  Filled 2023-06-01 (×4): qty 50

## 2023-06-01 MED ORDER — LEVOTHYROXINE SODIUM 25 MCG PO TABS
125.0000 ug | ORAL_TABLET | Freq: Every day | ORAL | Status: DC
  Administered 2023-06-02 – 2023-06-06 (×5): 125 ug via ORAL
  Filled 2023-06-01 (×5): qty 1

## 2023-06-01 MED ORDER — POLYETHYLENE GLYCOL 3350 17 G PO PACK: 17.0000 g | PACK | Freq: Every day | ORAL | Status: DC | PRN

## 2023-06-01 MED ORDER — ONDANSETRON HCL 4 MG PO TABS
4.0000 mg | ORAL_TABLET | Freq: Once | ORAL | Status: AC
  Administered 2023-06-01: 4 mg via ORAL
  Filled 2023-06-01: qty 1

## 2023-06-01 MED ORDER — ONDANSETRON HCL 4 MG/2ML IJ SOLN
4.0000 mg | Freq: Once | INTRAMUSCULAR | Status: AC
  Administered 2023-06-01: 4 mg via INTRAVENOUS
  Filled 2023-06-01: qty 2

## 2023-06-01 MED ORDER — TOPIRAMATE 25 MG PO TABS
50.0000 mg | ORAL_TABLET | Freq: Every day | ORAL | Status: DC
  Administered 2023-06-01 – 2023-06-05 (×5): 50 mg via ORAL
  Filled 2023-06-01 (×5): qty 2

## 2023-06-01 MED ORDER — MIRABEGRON ER 25 MG PO TB24
50.0000 mg | ORAL_TABLET | Freq: Every day | ORAL | Status: DC
  Administered 2023-06-02 – 2023-06-06 (×5): 50 mg via ORAL
  Filled 2023-06-01 (×6): qty 2

## 2023-06-01 MED ORDER — IOHEXOL 300 MG/ML  SOLN
100.0000 mL | Freq: Once | INTRAMUSCULAR | Status: AC | PRN
  Administered 2023-06-02: 100 mL via INTRAVENOUS

## 2023-06-01 MED ORDER — ONDANSETRON HCL 4 MG/2ML IJ SOLN: 4.0000 mg | Freq: Once | INTRAMUSCULAR | Status: AC

## 2023-06-01 MED ORDER — ARIPIPRAZOLE 5 MG PO TABS
5.0000 mg | ORAL_TABLET | Freq: Every day | ORAL | Status: DC
  Administered 2023-06-02 – 2023-06-06 (×5): 5 mg via ORAL
  Filled 2023-06-01 (×5): qty 1

## 2023-06-01 MED ORDER — ONDANSETRON HCL 4 MG/5ML PO SOLN: 8.0000 mg | Freq: Three times a day (TID) | ORAL | Status: DC | PRN

## 2023-06-01 MED ORDER — POTASSIUM CHLORIDE 10 MEQ/100ML IV SOLN
10.0000 meq | INTRAVENOUS | Status: AC
  Administered 2023-06-01 (×2): 10 meq via INTRAVENOUS
  Filled 2023-06-01 (×4): qty 100

## 2023-06-01 MED ORDER — ENOXAPARIN SODIUM 40 MG/0.4ML IJ SOSY
40.0000 mg | PREFILLED_SYRINGE | INTRAMUSCULAR | Status: DC
  Administered 2023-06-01: 40 mg via SUBCUTANEOUS
  Filled 2023-06-01: qty 0.4

## 2023-06-01 MED ORDER — SODIUM CHLORIDE 0.9 % IV BOLUS
1000.0000 mL | Freq: Once | INTRAVENOUS | Status: AC
  Administered 2023-06-01: 1000 mL via INTRAVENOUS

## 2023-06-01 MED ORDER — TIZANIDINE HCL 4 MG PO TABS
8.0000 mg | ORAL_TABLET | Freq: Three times a day (TID) | ORAL | Status: DC
  Administered 2023-06-01 – 2023-06-06 (×15): 8 mg via ORAL
  Filled 2023-06-01 (×15): qty 2

## 2023-06-01 MED ORDER — MIRTAZAPINE 15 MG PO TABS
30.0000 mg | ORAL_TABLET | Freq: Every day | ORAL | Status: DC
  Administered 2023-06-01 – 2023-06-05 (×5): 30 mg via ORAL
  Filled 2023-06-01 (×5): qty 2

## 2023-06-01 MED ORDER — SODIUM CHLORIDE 3 % IV SOLN
INTRAVENOUS | Status: DC
  Filled 2023-06-01 (×2): qty 500

## 2023-06-01 MED ORDER — MIDAZOLAM HCL 2 MG/2ML IJ SOLN
4.0000 mg | Freq: Once | INTRAMUSCULAR | Status: AC
  Administered 2023-06-01: 4 mg via INTRAMUSCULAR

## 2023-06-01 MED ORDER — ONDANSETRON HCL 4 MG/2ML IJ SOLN: 4.0000 mg | Freq: Four times a day (QID) | INTRAMUSCULAR | Status: DC | PRN

## 2023-06-01 NOTE — Procedures (Signed)
Central Venous Catheter Insertion Procedure Note  Samuel [name]  [id number]  14-Mar-1961  Date:06/01/23  Time:7:23 PM   Provider Performing:Samuel [name] A Samuel [name]   Procedure: Insertion of Non-tunneled Central Venous [phone]) with US guidance ([id number])   Indication(s) Medication administration  Consent Risks of the procedure as well as the alternatives and risks of each were explained to the patient and/or caregiver.  Consent for the procedure was obtained and is signed in the bedside chart  Anesthesia Topical only with 1% lidocaine   Timeout Verified patient identification, verified procedure, site/side was marked, verified correct patient position, special equipment/implants available, medications/allergies/relevant history reviewed, required imaging and test results available.  Sterile Technique Maximal sterile technique including full sterile barrier drape, hand hygiene, sterile gown, sterile gloves, mask, hair covering, sterile ultrasound probe cover (if used).  Procedure Description Area of catheter insertion was cleaned with chlorhexidine and draped in sterile fashion.  With real-time ultrasound guidance attempts made to access left IJ unsuccessfully   Was unable to place line  Will have another provider try  Samuel Diamond, MD Samuel [name] PCCM Pager: See Loretha Stapler

## 2023-06-01 NOTE — Progress Notes (Signed)
Sodium 112 from 108  Will hold on hypertonic saline at present  Can be reinitiated at about 25 cc an hour depending on next lab draw

## 2023-06-01 NOTE — Progress Notes (Signed)
Gave him 4 mg versed IM for comfort

## 2023-06-01 NOTE — H&P (Signed)
NAME:  Samuel [name], MRN:  [id number], DOB:  May 16, 1961, LOS: 0 ADMISSION DATE:  06/01/2023, CONSULTATION DATE: 06/01/2023 REFERRING MD: ED doc, CHIEF COMPLAINT: Hyponatremia  History of Present Illness:  62 year old gentleman with a history of MS Doing poorly over the last 3 to 4 days with nausea, vomiting, abdominal distention, constipation Did move his bowels about 2 days ago just watery, this was after receiving some milk of magnesia  Diagnosed with MS about age 36, progressive limitations Lives with his sister currently, she takes care of them, they have a caregiver coming into the house Home visits regularly Has not been short of breath, a little congested recently with a cough  Chronic history of constipation, neurogenic bladder Hypothyroidism  Was on Ocrevus up until a few years ago Pertinent  Medical History   Past Medical History:  Diagnosis Date   Anxiety    Bladder calculi    Chronic back pain    Chronic fatigue    Depression    Foley catheter in place    Gait disorder    GERD (gastroesophageal reflux disease)    History of kidney stones    Hyperlipidemia    Hypertension    Hypothyroidism    Incontinence of urine    Multiple sclerosis (HCC) NEUROLOGIST-- [name]   dx 1993, JC virus negative 2/14---  CURRENTLY RECEIVING TYSABRI IV TREATMENT   Neurogenic bladder    Neurogenic bowel    intermittant constipation and diarrhea   Pneumonia    dx  03-03-2014--  admitted for iv and oral antibiotics   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 rate 12, SpO2 91%.       No intake  or output data in the 24 hours ending 06/01/23 1441 There were no vitals filed for this visit.  Examination: General: Middle-age, does not appear to be in distress, HENT: Moist oral mucosa Lungs: Clear breath sounds bilaterally Cardiovascular: S1-S2 appreciated Abdomen: Soft, bowel sounds appreciated Extremities: No clubbing, no edema, contractures Neuro: Awake and interactive GU:   Resolved Hospital Problem list     Assessment & Plan:  Hyponatremia -Decreased intake, nausea vomiting, some abdominal discomfort -Does appear to be euvolemic  -Labs sent for osmolality and urine -CT ordered -Every 2 sodium checks  -urine electrolytes, urine osmolality -On hypertonic saline, will decrease rate to 50 cc from 75 cc -Check thyroid function tests -Check cortisol level  Hypokalemia -Being repleted  Multiple sclerosis -Complete continue monitoring  Leukocytosis -No fever, chest x-ray with no infiltrate -Will monitor -No clear indication for antibiotics at present  CT abdomen pelvis is pending at present  Best Practice (right click and "Reselect all SmartList Selections" daily)   Diet/type: Regular consistency (see orders) DVT prophylaxis: SCD GI prophylaxis: N/A Lines: N/A Foley:  N/A Code Status:  full code Last date of multidisciplinary goals of care discussion [discussed with sister at bedside]  Labs   CBC: Recent Labs  Lab 06/01/23 1200  WBC 14.8*  NEUTROABS 12.8*  HGB 17.0  HCT 46.0  MCV 77.6*  PLT 291    Basic Metabolic Panel: Recent Labs  Lab 06/01/23 1200 06/01/23 1320  NA 108* 109*  K 2.5*  --   CL <65*  --   CO2 26  --   GLUCOSE 109*  --   BUN 23  --   CREATININE 0.97  --   CALCIUM 9.2  --   MG  --  2.3   GFR: CrCl cannot be calculated (Unknown ideal weight.). Recent Labs  Lab 06/01/23 1200 06/01/23 1301  WBC 14.8*  --   LATICACIDVEN  --  1.5    Liver Function Tests: Recent Labs  Lab 06/01/23 1200  AST 33  ALT 31  ALKPHOS  157*  BILITOT 1.6*  PROT 8.8*  ALBUMIN 5.1*   Recent Labs  Lab 06/01/23 1200  LIPASE 34   No results for input(s): "AMMONIA" in the last 168 hours.  ABG    Component Value Date/Time   PHART 7.358 12/29/2017 1607   PCO2ART 44.3 12/29/2017 1607   PO2ART 414 (H) 12/29/2017 1607   HCO3 25.0 09/17/2018 0539   TCO2 29 03/21/2014 0945   ACIDBASEDEF 0.7 12/29/2017 1607   O2SAT 92.7 09/17/2018 0539     Coagulation Profile: No results for input(s): "INR", "PROTIME" in the last 168 hours.  Cardiac Enzymes: No results for input(s): "CKTOTAL", "CKMB", "CKMBINDEX", "TROPONINI" in the last 168 hours.  HbA1C: No results found for: "HGBA1C"  CBG: No results for input(s): "GLUCAP" in the last 168 hours.  Review of Systems:   Nausea, vomiting  Past Medical History:  He,  has a past medical history of Anxiety, Bladder calculi, Chronic back pain, Chronic fatigue, Depression, Foley catheter in place, Gait disorder, GERD (gastroesophageal reflux disease), History of kidney stones, Hyperlipidemia, Hypertension, Hypothyroidism, Incontinence of urine, Multiple sclerosis (HCC) (NEUROLOGIST-- [name]), Neurogenic bladder, Neurogenic bowel, Pneumonia, Spastic quadriparesis (HCC) (04/19/2013), Urinary retention, Vitamin D deficiency, and Yeast infection.   Surgical History:   Past Surgical History:  Procedure Laterality Date   APPENDECTOMY  age 30   CYSTOSCOPY N/A 03/21/2014   Procedure: CYSTOSCOPY TREATMENT OF BLADDER CALCULI;  Surgeon: [name], MD;  [facility]: [facility];  Service: Urology;  Laterality: N/A;   CYSTOSCOPY WITH BIOPSY N/A 12/29/2017   Procedure: TRANSURETHRAL RESECTION OF BLADDER TUMOR;  Surgeon: [name], MD;  Location: [facility];  Service: Urology;  Laterality: N/A;   CYSTOSCOPY WITH RETROGRADE PYELOGRAM, URETEROSCOPY AND STENT PLACEMENT Right 12/11/2017   Procedure: CYSTOSCOPY WITH RETROGRADE PYELOGRAM, URETEROSCOPY AND STENT PLACEMENT RIGHT;   Surgeon: [name], MD;  Location: [facility];  Service: Urology;  Laterality: Right;   CYSTOSCOPY WITH RETROGRADE PYELOGRAM, URETEROSCOPY AND STENT PLACEMENT Right 12/29/2017   Procedure: CYSTOSCOPY WITH RIGHT  RETROGRADE PYELOGRAM, URETEROSCOPY/LASER LITHOTRIPSY AND STENT PLACEMENT;  Surgeon: [name], MD;  Location: [facility];  Service: Urology;  Laterality: Right;   INSERTION OF SUPRAPUBIC CATHETER N/A 03/21/2014   Procedure: INSERTION OF SUPRAPUBIC CATHETER;  Surgeon: [name], MD;  Location: [facility];  Service: Urology;  Laterality: N/A;   NASAL SEPTUM SURGERY  1996   NEGATIVE SLEEP STUDY  1996   TRANSTHORACIC ECHOCARDIOGRAM  02-07-2004   MILD LVH/  EF 55-65%     Social History:   reports that he quit smoking about 37 years ago. His smoking use included cigarettes. He started smoking about 45 years ago. He has a 12 pack-year smoking history. He has never used smokeless tobacco. He reports that he does not drink alcohol and does not use drugs.   Family  History:  His family history includes Cirrhosis in his brother; Hypothyroidism in his sister; Stroke in his father.   Allergies Allergies  Allergen Reactions   Penicillins Other (See Comments)    > 20 yr ago, doesn't recall nature of reaction (didn't require hospitalization) Tolerates Ancef, Rocephin, and Cefepime per chart review Does not recall having taken amox/amp since initial rxn; none documented in Epic    Primaxin [Imipenem] Other (See Comments)    Seizure      The patient is critically ill with multiple organ systems failure and requires high complexity decision making for assessment and support, frequent evaluation and titration of therapies, application of advanced monitoring technologies and extensive interpretation of multiple databases. Critical Care Time devoted to patient care services described in this note independent of APP/resident time (if applicable)  is 32 minutes.   Virl Diamond MD [facility] Personal pager: See Amion If unanswered, please page CCM On-call: #[phone]

## 2023-06-01 NOTE — ED Notes (Signed)
ED TO INPATIENT HANDOFF REPORT  ED Nurse Name and Phone #: Robbi Garter Name/Age/Gender Huston Foley [place] 62 y.o. male Room/Bed: WA14/WA14  Code Status   Code Status: Full Code  Home/SNF/Other Home Patient oriented to: self, place, time, and situation Is this baseline? Yes   Triage Complete: Triage complete  Chief Complaint Hyponatremia [E87.1]  Triage Note EMS reports from home N/V/D7 and general malaise x 4 days. Failure to thrive, poor intake, last protein shake 4 days ago. Hx of MS.  BP 142/100 HR 82 RR 26 Sp02 95 @ 4 ltrs. CBG 102     Allergies Allergies  Allergen Reactions   Penicillins Other (See Comments)    > 20 yr ago, doesn't recall nature of reaction (didn't require hospitalization) Tolerates Ancef, Rocephin, and Cefepime per chart review Does not recall having taken amox/amp since initial rxn; none documented in Epic    Primaxin [Imipenem] Other (See Comments)    Seizure     Level of Care/Admitting Diagnosis ED Disposition     ED Disposition  Admit   Condition  --   Comment  Hospital [facility]: [facility] [[id number]]  Level of Care: ICU [6]  May admit patient to [facility] or [facility] if equivalent level of care is available:: Yes  Covid Evaluation: Asymptomatic - no recent exposure (last 10 days) testing not required  Diagnosis: Hyponatremia [198519]  Admitting Physician: Tomma Lightning [[id number]]  Attending Physician: Tomma Lightning [[id number]]  Certification:: I certify this patient will need inpatient services for at least 2 midnights  Expected Medical Readiness: 06/04/2023          B Medical/Surgery History Past Medical History:  Diagnosis Date   Anxiety    Bladder calculi    Chronic back pain    Chronic fatigue    Depression    Foley catheter in place    Gait disorder    GERD (gastroesophageal reflux disease)    History of kidney stones    Hyperlipidemia    Hypertension    Hypothyroidism     Incontinence of urine    Multiple sclerosis (HCC) NEUROLOGIST-- [name]   dx 1993, JC virus negative 2/14---  CURRENTLY RECEIVING TYSABRI IV TREATMENT   Neurogenic bladder    Neurogenic bowel    intermittant constipation and diarrhea   Pneumonia    dx  03-03-2014--  admitted for iv and oral antibiotics   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   Urinary retention    Vitamin D deficiency    Yeast infection    genital area   Past Surgical History:  Procedure Laterality Date   APPENDECTOMY  age 84   CYSTOSCOPY N/A 03/21/2014   Procedure: CYSTOSCOPY TREATMENT OF BLADDER CALCULI;  Surgeon: [name], MD;  Location: [facility];  Service: Urology;  Laterality: N/A;   CYSTOSCOPY WITH BIOPSY N/A 12/29/2017   Procedure: TRANSURETHRAL RESECTION OF BLADDER TUMOR;  Surgeon: [name], MD;  Location: [facility];  Service: Urology;  Laterality: N/A;   CYSTOSCOPY WITH RETROGRADE PYELOGRAM, URETEROSCOPY AND STENT PLACEMENT Right 12/11/2017   Procedure: CYSTOSCOPY WITH RETROGRADE PYELOGRAM, URETEROSCOPY AND STENT PLACEMENT RIGHT;  Surgeon: [name], MD;  Location: [facility];  Service: Urology;  Laterality: Right;   CYSTOSCOPY WITH RETROGRADE PYELOGRAM, URETEROSCOPY AND STENT PLACEMENT Right 12/29/2017   Procedure: CYSTOSCOPY WITH RIGHT  RETROGRADE PYELOGRAM, URETEROSCOPY/LASER LITHOTRIPSY AND STENT PLACEMENT;  Surgeon: [name], MD;  Location: [facility];  Service: Urology;  Laterality:  Right;   INSERTION OF SUPRAPUBIC CATHETER N/A 03/21/2014   Procedure: INSERTION OF SUPRAPUBIC CATHETER;  Surgeon: [name], MD;  [facility];  Service: Urology;  Laterality: N/A;   NASAL SEPTUM SURGERY  1996   NEGATIVE SLEEP STUDY  1996   TRANSTHORACIC ECHOCARDIOGRAM  02-07-2004   MILD LVH/  EF 55-65%     A IV Location/Drains/Wounds Patient Lines/Drains/Airways Status     Active Line/Drains/Airways     Name Placement date Placement time Site Days    Peripheral IV 06/01/23 20 G Anterior;Left;Proximal Forearm 06/01/23  1157  Forearm  less than 1   Suprapubic Catheter Double-lumen;Latex 16 Fr. 12/11/17  1545  Double-lumen;Latex  1998   Ureteral Drain/Stent Right ureter 6 Fr. 12/29/17  1256  Right ureter   1980   Pressure Injury 04/21/18 Stage II -  Partial thickness loss of dermis presenting as a shallow open ulcer with a red, pink wound bed without slough. 3 seperate areas close proximity on the left buttock  04/21/18  0130  -- 1867   Pressure Injury 09/16/18 09/16/18  1737  -- 1719            Intake/Output Last 24 hours No intake or output data in the 24 hours ending 06/01/23 1456  Labs/Imaging Results for orders placed or performed during the hospital encounter of 06/01/23 (from the past 48 hour(s))  Comprehensive metabolic panel     Status: Abnormal   Collection Time: 06/01/23 12:00 PM  Result Value Ref Range   Sodium 108 (LL) 135 - 145 mmol/L    Comment: CRITICAL RESULT CALLED TO, READ BACK BY AND VERIFIED WITH RN D MARLU AT 1306 06/01/23 CRUICKSHANK A    Potassium 2.5 (LL) 3.5 - 5.1 mmol/L    Comment: CRITICAL RESULT CALLED TO, READ BACK BY AND VERIFIED WITH RN D MARLU AT 1306 06/01/23 CRUICKSHANK A    Chloride <65 (LL) 98 - 111 mmol/L    Comment: CRITICAL RESULT CALLED TO, READ BACK BY AND VERIFIED WITH RN D MARLU AT 1306 06/01/23 CRUICKSHANK A    CO2 26 22 - 32 mmol/L   Glucose, Bld 109 (H) 70 - 99 mg/dL    Comment: Glucose reference range applies only to samples taken after fasting for at least 8 hours.   BUN 23 8 - 23 mg/dL   Creatinine, Ser 3.08 0.61 - 1.24 mg/dL   Calcium 9.2 8.9 - 65.7 mg/dL   Total Protein 8.8 (H) 6.5 - 8.1 g/dL   Albumin 5.1 (H) 3.5 - 5.0 g/dL   AST 33 15 - 41 U/L   ALT 31 0 - 44 U/L   Alkaline Phosphatase 157 (H) 38 - 126 U/L   Total Bilirubin 1.6 (H) 0.3 - 1.2 mg/dL   GFR, Estimated >84 >69 mL/min    Comment: (NOTE) Calculated using the CKD-EPI Creatinine Equation (2021)    Anion gap  NOT CALCULATED 5 - 15    Comment: Performed at [facility], [address], [place], [place] [address]  Lipase, blood     Status: None   Collection Time: 06/01/23 12:00 PM  Result Value Ref Range   Lipase 34 11 - 51 U/L    Comment: Performed at [facility], [address], [place], [place] [address]  CBC with Differential     Status: Abnormal   Collection Time: 06/01/23 12:00 PM  Result Value Ref Range   WBC 14.8 (H) 4.0 - 10.5 K/uL  Comment: WHITE COUNT CONFIRMED ON SMEAR   RBC 5.93 (H) 4.22 - 5.81 MIL/uL   Hemoglobin 17.0 13.0 - 17.0 g/dL   HCT 60.4 54.0 - 98.1 %   MCV 77.6 (L) 80.0 - 100.0 fL   MCH 28.7 26.0 - 34.0 pg   MCHC 37.0 (H) 30.0 - 36.0 g/dL   RDW 19.1 47.8 - 29.5 %   Platelets 291 150 - 400 K/uL   nRBC 0.0 0.0 - 0.2 %   Neutrophils Relative % 87 %   Neutro Abs 12.8 (H) 1.7 - 7.7 K/uL   Lymphocytes Relative 4 %   Lymphs Abs 0.6 (L) 0.7 - 4.0 K/uL   Monocytes Relative 8 %   Monocytes Absolute 1.1 (H) 0.1 - 1.0 K/uL   Eosinophils Relative 0 %   Eosinophils Absolute 0.1 0.0 - 0.5 K/uL   Basophils Relative 0 %   Basophils Absolute 0.1 0.0 - 0.1 K/uL   Immature Granulocytes 1 %   Abs Immature Granulocytes 0.17 (H) 0.00 - 0.07 K/uL    Comment: Performed at [facility], [address], [place], [place] [address]  I-Stat Lactic Acid     Status: None   Collection Time: 06/01/23  1:01 PM  Result Value Ref Range   Lactic Acid, Venous 1.5 0.5 - 1.9 mmol/L  Urinalysis, Routine w reflex microscopic -Urine, Clean Catch     Status: Abnormal   Collection Time: 06/01/23  1:08 PM  Result Value Ref Range   Color, Urine YELLOW YELLOW   APPearance HAZY (A) CLEAR   Specific Gravity, Urine 1.013 1.005 - 1.030   pH 5.0 5.0 - 8.0   Glucose, UA >=500 (A) NEGATIVE mg/dL   Hgb urine dipstick MODERATE (A) NEGATIVE   Bilirubin Urine NEGATIVE NEGATIVE   Ketones, ur 80 (A) NEGATIVE mg/dL   Protein, ur 865 (A) NEGATIVE mg/dL    Nitrite NEGATIVE NEGATIVE   Leukocytes,Ua MODERATE (A) NEGATIVE   RBC / HPF 0-5 0 - 5 RBC/hpf   WBC, UA >50 0 - 5 WBC/hpf   Bacteria, UA MANY (A) NONE SEEN   Squamous Epithelial / HPF 0-5 0 - 5 /HPF   Amorphous Crystal PRESENT     Comment: Performed at [facility], [address] [place], [place] [address]  Sodium, urine, random     Status: None   Collection Time: 06/01/23  1:08 PM  Result Value Ref Range   Sodium, Ur 13 mmol/L    Comment: Performed at [facility], [address] [place], [place] [address]  Sodium     Status: Abnormal   Collection Time: 06/01/23  1:20 PM  Result Value Ref Range   Sodium 109 (LL) 135 - 145 mmol/L    Comment: CRITICAL RESULT CALLED TO, READ BACK BY AND VERIFIED WITH DOWD,P. RN AT 1410 06/01/2023 MULLINS,T Performed at [facility], [address], [place], [place] [address]   Magnesium     Status: None   Collection Time: 06/01/23  1:20 PM  Result Value Ref Range   Magnesium 2.3 1.7 - 2.4 mg/dL    Comment: Performed at [facility], [address], [place], [place] [address]   No results found.  Pending Labs Unresulted Labs (From admission, onward)     Start     Ordered   06/08/23 0500  Creatinine, serum  (enoxaparin (LOVENOX)    CrCl >/= 30 ml/min)  Weekly,   R     Comments:  while on enoxaparin therapy    06/01/23 1446   06/02/23 0500  CBC  Tomorrow morning,   R        06/01/23 1446   06/02/23 0500  Magnesium  Tomorrow morning,   R        06/01/23 1446   06/02/23 0500  Phosphorus  Tomorrow morning,   R        06/01/23 1446   06/01/23 1909  Sodium  Every 4 hours,   R      06/01/23 1310   06/01/23 1444  HIV Antibody (routine testing w rflx)  (HIV Antibody (Routine testing w reflex) panel)  Once,   R        06/01/23 1446   06/01/23 1444  CBC  (enoxaparin (LOVENOX)    CrCl >/= 30 ml/min)  Once,   R       Comments: Baseline for enoxaparin therapy IF NOT ALREADY  DRAWN.  Notify MD if PLT < 100 K.    06/01/23 1446   06/01/23 1309  Sodium  Now then every 2 hours,   R      06/01/23 1310   06/01/23 1309  Osmolality  Once,   URGENT        06/01/23 1310   06/01/23 1309  Osmolality, urine  Once,   URGENT        06/01/23 1310   06/01/23 1137  SARS Coronavirus 2 by RT PCR (hospital order, performed in [facility] hospital lab) *cepheid single result test* Anterior Nasal Swab  (SARS Coronavirus 2 by RT PCR (hospital order, performed in [facility] hospital lab) *cepheid single result test*)  Once,   URGENT        06/01/23 1136            Vitals/Pain Today's Vitals   06/01/23 1035 06/01/23 1130 06/01/23 1230 06/01/23 1400  BP: (!) 154/86 129/81 117/65 103/69  Pulse: 80 81 81 83  Resp: 18 16 14 12   Temp: 97.6 F (36.4 C)     TempSrc: Oral     SpO2: 98% 95% 98% 91%  PainSc: 10-Worst pain ever       Isolation Precautions Airborne and Contact precautions  Medications Medications  sodium chloride (hypertonic) 3 % solution ( Intravenous New Bag/Given 06/01/23 1417)  potassium chloride 10 mEq in 100 mL IVPB (10 mEq Intravenous New Bag/Given 06/01/23 1432)  iohexol (OMNIPAQUE) 300 MG/ML solution 100 mL (has no administration in time range)  docusate sodium (COLACE) capsule 100 mg (has no administration in time range)  polyethylene glycol (MIRALAX / GLYCOLAX) packet 17 g (has no administration in time range)  enoxaparin (LOVENOX) injection 40 mg (has no administration in time range)  ondansetron (ZOFRAN) injection 4 mg (4 mg Intravenous Given 06/01/23 1157)  sodium chloride 0.9 % bolus 1,000 mL (0 mLs Intravenous Stopped 06/01/23 1328)    Mobility non-ambulatory     Focused Assessments Hypokalemia     R Recommendations: See Admitting Provider Note  Report given to:   Additional Notes: .

## 2023-06-01 NOTE — Progress Notes (Signed)
Assessed BUE for PICC placement.  RUE completely contracted.  LUE with contracture but able to slightly abduct arm. No veins suitable noted.  Unable to locate basilic and cephalic.  Brachial small and noncompressible.  Left forearm also assessed with ultrasound for USG PIV, no suitable veins present.  Dr Wynona Neat notified.

## 2023-06-01 NOTE — ED Provider Notes (Signed)
Emergency Department Provider Note   I have reviewed the triage vital signs and the nursing notes.   HISTORY  Chief Complaint Nausea, Emesis, Diarrhea, and Failure To Thrive   HPI Samuel [name] is a 62 y.o. male past history reviewed presents emergency department with generalized weakness.  Patient with advanced multiple sclerosis and bedbound at baseline.  Has had nausea/vomiting along with some diarrhea.  Denies severe pain but has developed some profound weakness.  IV fluids given by EMS prior to arrival.  No family at bedside for further history.    Past Medical History:  Diagnosis Date   Anxiety    Bladder calculi    Chronic back pain    Chronic fatigue    Depression    Foley catheter in place    Gait disorder    GERD (gastroesophageal reflux disease)    History of kidney stones    Hyperlipidemia    Hypertension    Hypothyroidism    Incontinence of urine    Multiple sclerosis (HCC) NEUROLOGIST-- [name]   dx 1993, JC virus negative 2/14---  CURRENTLY RECEIVING TYSABRI IV TREATMENT   Neurogenic bladder    Neurogenic bowel    intermittant constipation and diarrhea   Pneumonia    dx  03-03-2014--  admitted for iv and oral antibiotics   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 Positive weakness.  Cardiovascular: Denies chest pain. Respiratory: Denies shortness of breath. Gastrointestinal: No abdominal pain. Positive nausea and vomiting.  Genitourinary: Negative for dysuria. Musculoskeletal: Negative for back pain. Skin: Negative for rash. Neurological: Negative for headaches.  ____________________________________________   PHYSICAL EXAM:  VITAL SIGNS: ED Triage Vitals [06/01/23 1035]  Encounter Vitals Group     BP (!) 154/86     Pulse Rate 80     Resp 18     Temp 97.6 F (36.4 C)     Temp Source Oral     SpO2  98 %   Constitutional: Alert and oriented. Appears chronically ill.  Eyes: Conjunctivae are normal.  Head: Atraumatic. Nose: No congestion/rhinnorhea. Mouth/Throat: Mucous membranes are dry.  Neck: No stridor.   Cardiovascular: Normal rate, regular rhythm. Good peripheral circulation. Grossly normal heart sounds.   Respiratory: Normal respiratory effort.  No retractions. Lungs CTAB. Gastrointestinal: Soft and nontender. No distention.  Musculoskeletal: No gross deformities of extremities. Neurologic:  Normal speech and language.  Skin:  Skin is warm, dry and intact. No rash noted.  ____________________________________________   LABS (all labs ordered are listed, but only abnormal results are displayed)  Labs Reviewed  COMPREHENSIVE METABOLIC PANEL - Abnormal; Notable for the following components:      Result Value   Sodium 108 (*)    Potassium 2.5 (*)    Chloride <65 (*)    Glucose, Bld 109 (*)    Total Protein 8.8 (*)    Albumin 5.1 (*)    Alkaline Phosphatase 157 (*)    Total Bilirubin 1.6 (*)    All other components within normal limits  CBC WITH DIFFERENTIAL/PLATELET - Abnormal; Notable for the following components:   WBC 14.8 (*)    RBC 5.93 (*)    MCV 77.6 (*)    MCHC 37.0 (*)    Neutro Abs 12.8 (*)    Lymphs Abs 0.6 (*)    Monocytes Absolute 1.1 (*)  Abs Immature Granulocytes 0.17 (*)    All other components within normal limits  SARS CORONAVIRUS 2 BY RT PCR  LIPASE, BLOOD  URINALYSIS, ROUTINE W REFLEX MICROSCOPIC  I-STAT CG4 LACTIC ACID, ED  I-STAT CG4 LACTIC ACID, ED   ____________________________________________  EKG   EKG Interpretation Date/Time:  Sunday June 01 2023 12:00:41 EDT Ventricular Rate:  85 PR Interval:  160 QRS Duration:  106 QT Interval:  463 QTC Calculation: 551 R Axis:   40  Text Interpretation: Sinus rhythm LAE, consider biatrial enlargement Probable lateral infarct, old Prolonged QT interval since last tracing no  significant change Confirmed by Rolan Bucco [phone]) on 06/02/2023 11:31:19 AM        ____________________________________________   PROCEDURES  Procedure(s) performed:   Procedures  CRITICAL CARE Performed by: Maia Plan Total critical care time: 35 minutes Critical care time was exclusive of separately billable procedures and treating other patients. Critical care was necessary to treat or prevent imminent or life-threatening deterioration. Critical care was time spent personally by me on the following activities: development of treatment plan with patient and/or surrogate as well as nursing, discussions with consultants, evaluation of patient's response to treatment, examination of patient, obtaining history from patient or surrogate, ordering and performing treatments and interventions, ordering and review of laboratory studies, ordering and review of radiographic studies, pulse oximetry and re-evaluation of patient's condition.  Alona Bene, MD Emergency Medicine  ____________________________________________   INITIAL IMPRESSION / ASSESSMENT AND PLAN / ED COURSE  Pertinent labs & imaging results that were available during my care of the patient were reviewed by me and considered in my medical decision making (see chart for details).   This patient is Presenting for Evaluation of AMS, which does require a range of treatment options, and is a complaint that involves a high risk of morbidity and mortality.  The Differential Diagnoses includes but is not exclusive to alcohol, illicit or prescription medications, intracranial pathology such as stroke, intracerebral hemorrhage, fever or infectious causes including sepsis, hypoxemia, uremia, trauma, endocrine related disorders such as diabetes, hypoglycemia, thyroid-related diseases, etc.   Critical Interventions-    Medications  potassium chloride 10 mEq in 100 mL IVPB (10 mEq Intravenous Not Given 06/01/23 2129)  0.9 %   sodium chloride infusion (0 mLs Intravenous Stopped 06/03/23 1651)  0.9 %  sodium chloride infusion (0 mLs Intravenous Stopped 06/06/23 0747)  ondansetron (ZOFRAN) injection 4 mg (4 mg Intravenous Given 06/01/23 1157)  sodium chloride 0.9 % bolus 1,000 mL (0 mLs Intravenous Stopped 06/01/23 1328)  iohexol (OMNIPAQUE) 300 MG/ML solution 100 mL (100 mLs Intravenous Contrast Given 06/02/23 0127)  ondansetron (ZOFRAN) injection 4 mg (4 mg Intravenous Given 06/01/23 1648)  ondansetron (ZOFRAN) tablet 4 mg (4 mg Oral Given 06/01/23 1741)  midazolam (VERSED) injection 4 mg (4 mg Intramuscular Given 06/01/23 1834)  midazolam (VERSED) 2 MG/2ML injection (  Duplicate 06/01/23 1834)  potassium chloride 10 mEq in 50 mL *CENTRAL LINE* IVPB (0 mEq Intravenous Stopped 06/02/23 0414)  oxidized cellulose (Surgicel) pad 1 each (1 each Topical Given 06/02/23 0554)  potassium PHOSPHATE 15 mmol in dextrose 5 % 250 mL infusion (0 mmol Intravenous Stopped 06/02/23 2008)  oxidized cellulose (Surgicel) pad 1 each (1 each Topical Given 06/02/23 1434)  potassium chloride (KLOR-CON) packet 40 mEq (40 mEq Oral Given 06/03/23 0924)  potassium PHOSPHATE 30 mmol in dextrose 5 % 500 mL infusion (0 mmol Intravenous Stopped 06/03/23 1524)  sodium chloride 0.9 % bolus 1,000 mL (0 mLs  Intravenous Stopped 06/03/23 1702)  potassium chloride 10 mEq in 100 mL IVPB (0 mEq Intravenous Stopped 06/03/23 2235)  potassium PHOSPHATE 30 mmol in dextrose 5 % 500 mL infusion (0 mmol Intravenous Stopped 06/04/23 0346)  potassium chloride SA (KLOR-CON M) CR tablet 20 mEq (20 mEq Oral Given 06/03/23 2127)  magnesium sulfate IVPB 4 g 100 mL (0 g Intravenous Stopped 06/03/23 2221)  potassium chloride SA (KLOR-CON M) CR tablet 60 mEq (60 mEq Oral Given 06/05/23 0756)  sodium chloride 0.9 % bolus 1,000 mL ( Intravenous Stopped 06/05/23 1235)  diazepam (VALIUM) tablet 5 mg (5 mg Oral Given 06/06/23 1001)    Reassessment after intervention: symptoms unchanged. Na slightly  up-trending.    I did obtain Additional Historical Information from EMS.  Clinical Laboratory Tests Ordered, included severe hyponatremia to 108 with associated hypokalemia.  Radiologic Tests Ordered, included CT abdomen/pelvis. I independently interpreted the images and agree with radiology interpretation.   Cardiac Monitor Tracing which shows NSR.    Social Determinants of Health Risk patient is not an active smoker.    Consult complete with ICU. Plan for admit.   Medical Decision Making: Summary:  Patient presents emergency department for evaluation of generalized weakness.  Found to be profoundly hyponatremic.  He is slightly drowsy but able to provide a history and follow commands.  No seizure activity.  Plan for hypertonic saline and ICU admit.  Reevaluation with update and discussion with patient. No worsening in mental status. Plan for ICU admit.   Patient's presentation is most consistent with acute presentation with potential threat to life or bodily function.   Disposition: admit  ____________________________________________  FINAL CLINICAL IMPRESSION(S) / ED DIAGNOSES  Final diagnoses:  Hyponatremia    Note:  This document was prepared using Dragon voice recognition software and may include unintentional dictation errors.  Alona Bene, MD, [facility] Emergency Medicine    Leo Weyandt, Arlyss Repress, MD 06/11/23 [phone]

## 2023-06-01 NOTE — ED Triage Notes (Signed)
EMS reports from home N/V/D7 and general malaise x 4 days. Failure to thrive, poor intake, last protein shake 4 days ago. Hx of MS.  BP 142/100 HR 82 RR 26 Sp02 95 @ 4 ltrs. CBG 102

## 2023-06-01 NOTE — Procedures (Signed)
Central Venous Catheter Insertion Procedure Note  Samuel [name]  [id number]  07-Sep-1960  Date:06/01/23  Time:9:35 PM   Provider Performing:Earnie Bechard R Trinda Harlacher   Procedure: Insertion of Non-tunneled Central Venous [phone]) with US guidance ([id number])   Indication(s) Medication administration and Difficult access  Consent Risks of the procedure as well as the alternatives and risks of each were explained to the patient and/or caregiver.  Consent for the procedure was obtained and is signed in the bedside chart  Anesthesia Topical only with 1% lidocaine   Timeout Verified patient identification, verified procedure, site/side was marked, verified correct patient position, special equipment/implants available, medications/allergies/relevant history reviewed, required imaging and test results available.  Sterile Technique Maximal sterile technique including full sterile barrier drape, hand hygiene, sterile gown, sterile gloves, mask, hair covering, sterile ultrasound probe cover (if used).  Procedure Description Area of catheter insertion was cleaned with chlorhexidine and draped in sterile fashion.  With real-time ultrasound guidance a central venous catheter was placed into the right femoral vein. Nonpulsatile blood flow and easy flushing noted in all ports.  The catheter was sutured in place and sterile dressing applied.  Complications/Tolerance None; patient tolerated the procedure well. Chest X-ray is ordered to verify placement for internal jugular or subclavian cannulation.   Chest x-ray is not ordered for femoral cannulation.  EBL Minimal  Specimen(s) None

## 2023-06-02 ENCOUNTER — Other Ambulatory Visit: Payer: Self-pay

## 2023-06-02 ENCOUNTER — Inpatient Hospital Stay (HOSPITAL_COMMUNITY): Payer: Medicare Other

## 2023-06-02 DIAGNOSIS — E871 Hypo-osmolality and hyponatremia: Secondary | ICD-10-CM | POA: Diagnosis not present

## 2023-06-02 LAB — SODIUM
Sodium: 114 mmol/L — CL (ref 135–145)
Sodium: 114 mmol/L — CL (ref 135–145)
Sodium: 114 mmol/L — CL (ref 135–145)
Sodium: 115 mmol/L — CL (ref 135–145)
Sodium: 115 mmol/L — CL (ref 135–145)
Sodium: 116 mmol/L — CL (ref 135–145)

## 2023-06-02 LAB — PHOSPHORUS: Phosphorus: <1 mg/dL — CL (ref 2.5–4.6)

## 2023-06-02 LAB — CBC
HCT: 41.8 % (ref 39.0–52.0)
Hemoglobin: 15.3 g/dL (ref 13.0–17.0)
MCH: 29.4 pg (ref 26.0–34.0)
MCHC: 36.6 g/dL — ABNORMAL HIGH (ref 30.0–36.0)
MCV: 80.4 fL (ref 80.0–100.0)
Platelets: 264 10*3/uL (ref 150–400)
RBC: 5.2 MIL/uL (ref 4.22–5.81)
RDW: 12.2 % (ref 11.5–15.5)
WBC: 15.8 10*3/uL — ABNORMAL HIGH (ref 4.0–10.5)
nRBC: 0 % (ref 0.0–0.2)

## 2023-06-02 LAB — BASIC METABOLIC PANEL
Anion gap: 15 (ref 5–15)
BUN: 19 mg/dL (ref 8–23)
CO2: 24 mmol/L (ref 22–32)
Calcium: 8.3 mg/dL — ABNORMAL LOW (ref 8.9–10.3)
Chloride: 74 mmol/L — ABNORMAL LOW (ref 98–111)
Creatinine, Ser: 0.84 mg/dL (ref 0.61–1.24)
GFR, Estimated: >60 mL/min (ref >60)
Glucose, Bld: 115 mg/dL — ABNORMAL HIGH (ref 70–99)
Potassium: 2.6 mmol/L — CL (ref 3.5–5.1)
Sodium: 114 mmol/L — CL (ref 135–145)

## 2023-06-02 LAB — PROTIME-INR
INR: 1 (ref 0.8–1.2)
Prothrombin Time: 13.3 s (ref 11.4–15.2)

## 2023-06-02 LAB — APTT: aPTT: 37 s — ABNORMAL HIGH (ref 24–36)

## 2023-06-02 LAB — TSH: TSH: 1.589 u[IU]/mL (ref 0.350–4.500)

## 2023-06-02 LAB — MRSA NEXT GEN BY PCR, NASAL: MRSA by PCR Next Gen: NOT DETECTED

## 2023-06-02 LAB — SARS CORONAVIRUS 2 BY RT PCR: SARS Coronavirus 2 by RT PCR: NEGATIVE

## 2023-06-02 LAB — CORTISOL: Cortisol, Plasma: 44.5 ug/dL

## 2023-06-02 LAB — HIV ANTIBODY (ROUTINE TESTING W REFLEX): HIV Screen 4th Generation wRfx: NONREACTIVE

## 2023-06-02 LAB — T4, FREE: Free T4: 1.12 ng/dL (ref 0.61–1.12)

## 2023-06-02 LAB — MAGNESIUM: Magnesium: 2.3 mg/dL (ref 1.7–2.4)

## 2023-06-02 MED ORDER — OXIDIZED CELLULOSE EX PADS
1.0000 | MEDICATED_PAD | Freq: Once | CUTANEOUS | Status: AC
  Administered 2023-06-02: 1 via TOPICAL
  Filled 2023-06-02: qty 1

## 2023-06-02 MED ORDER — POTASSIUM PHOSPHATES 15 MMOLE/5ML IV SOLN
30.0000 mmol | Freq: Four times a day (QID) | INTRAVENOUS | Status: DC
  Administered 2023-06-02: 30 mmol via INTRAVENOUS
  Filled 2023-06-02 (×2): qty 10

## 2023-06-02 MED ORDER — DIAZEPAM 5 MG PO TABS
5.0000 mg | ORAL_TABLET | Freq: Every day | ORAL | Status: DC | PRN
  Administered 2023-06-05: 5 mg via ORAL
  Filled 2023-06-02 (×2): qty 1

## 2023-06-02 MED ORDER — "THROMBI-PAD 3""X3"" EX PADS": 1.0000 | MEDICATED_PAD | Freq: Once | CUTANEOUS | Status: DC

## 2023-06-02 MED ORDER — ONDANSETRON HCL 4 MG/2ML IJ SOLN
4.0000 mg | Freq: Four times a day (QID) | INTRAMUSCULAR | Status: DC | PRN
  Administered 2023-06-02 (×2): 4 mg via INTRAVENOUS
  Filled 2023-06-02 (×2): qty 2

## 2023-06-02 MED ORDER — POTASSIUM PHOSPHATES 15 MMOLE/5ML IV SOLN: 30.0000 mmol | INTRAVENOUS | Status: DC

## 2023-06-02 MED ORDER — DEXTROSE 5 % IV SOLN: INTRAVENOUS | Status: DC

## 2023-06-02 MED ORDER — POTASSIUM PHOSPHATES 15 MMOLE/5ML IV SOLN
15.0000 mmol | Freq: Once | INTRAVENOUS | Status: AC
  Administered 2023-06-02: 15 mmol via INTRAVENOUS
  Filled 2023-06-02: qty 5

## 2023-06-02 MED ORDER — BACLOFEN 10 MG PO TABS
20.0000 mg | ORAL_TABLET | Freq: Three times a day (TID) | ORAL | Status: DC
  Administered 2023-06-02: 20 mg via ORAL
  Filled 2023-06-02: qty 2

## 2023-06-02 MED ORDER — BACLOFEN 10 MG PO TABS
40.0000 mg | ORAL_TABLET | Freq: Three times a day (TID) | ORAL | Status: DC
  Administered 2023-06-02 – 2023-06-06 (×13): 40 mg via ORAL
  Filled 2023-06-02 (×13): qty 4

## 2023-06-02 MED ORDER — CHLORHEXIDINE GLUCONATE CLOTH 2 % EX PADS
6.0000 | MEDICATED_PAD | Freq: Every day | CUTANEOUS | Status: DC
  Administered 2023-06-02 – 2023-06-06 (×5): 6 via TOPICAL

## 2023-06-02 MED ORDER — ACETAMINOPHEN 325 MG PO TABS
650.0000 mg | ORAL_TABLET | Freq: Four times a day (QID) | ORAL | Status: DC | PRN
  Administered 2023-06-02: 650 mg via ORAL
  Filled 2023-06-02: qty 2

## 2023-06-02 MED ORDER — SODIUM CHLORIDE 0.9 % IV SOLN: INTRAVENOUS | Status: AC

## 2023-06-02 NOTE — Progress Notes (Addendum)
eLink Physician-Brief Progress Note Patient Name: Samuel [name] DOB: 1961/06/22 MRN: [id number]   Date of Service  06/02/2023  HPI/Events of Note  Na up to 115 from 108, 12 hours prior No new neurologic symptoms.  3% saline had not been started, no maintenance fluids at this time.   eICU Interventions  Will start on D5W to slow down rate of correction. Will continue to monitor closely.         [name] 06/02/2023, 12:55 AM  2:19 AM PT oozing from femoral line.  Will apply sandbags to the site, hold lovenox for now.  Follow AM labs, added on coags as well.   5:14 AM Pt continues to ooze from femoral line site.  Sandbags with minimal effect.  Hgb stable.  Plts WNL, no coagulopathy to reverse based on labs.  Will put on thrombi-pad, and see if this help pt form a clot.   Na 116 - increased D5W to 143ml/hr  6:16 AM Pt with Hypophosphatemia, hypokalemia Placed order for Kphos x 2 doses

## 2023-06-02 NOTE — Progress Notes (Signed)
This nurse notified CCM doctor Paliwal about small amount of clear fluid coming from the left ear at 2315. No new orders at this time.

## 2023-06-02 NOTE — Progress Notes (Signed)
NAME:  Samuel [name], MRN:  [id number], DOB:  [birth year], LOS: 1 ADMISSION DATE:  06/01/2023, CONSULTATION DATE: 06/01/2023 REFERRING MD: ED doc, CHIEF COMPLAINT: Hyponatremia  History of Present Illness:  61 year old gentleman with a history of MS Doing poorly over the last 3 to 4 days with nausea, vomiting, abdominal distention, constipation Did move his bowels about 2 days ago just watery, this was after receiving some milk of magnesia  Diagnosed with MS about age 57, progressive limitations Lives with his sister currently, she takes care of them, they have a caregiver coming into the house Home visits regularly Has not been short of breath, a little congested recently with a cough  Chronic history of constipation, neurogenic bladder Hypothyroidism  Was on Ocrevus up until a few years ago Pertinent  Medical History   Past Medical History:  Diagnosis Date   Anxiety    Bladder calculi    Chronic back pain    Chronic fatigue    Depression    Foley catheter in place    Gait disorder    GERD (gastroesophageal reflux disease)    History of kidney stones    Hyperlipidemia    Hypertension    Hypothyroidism    Incontinence of urine    Multiple sclerosis (HCC) NEUROLOGIST-- [name]   dx 1993, JC virus negative 2/14---  CURRENTLY RECEIVING TYSABRI IV TREATMENT   Neurogenic bladder    Neurogenic bowel    intermittant constipation and diarrhea   Pneumonia    dx  03-03-2014--  admitted for iv and oral antibiotics   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  N/V/ constipation at home x 3 days  Interim History / Subjective:  R femoral CVL placed overnight for poor IV access, no sights available for PICC.  Site oozing after trip to CT CT a/p  neg for acute findings Started on D5W overnight to slow correction of Na  Objective   Blood pressure (!) 108/56, pulse 88, temperature 97.8 F (36.6 C), temperature source Oral, resp. rate 20, height 6\' 1"  (1.854 m), weight 75.4 kg, SpO2 96%.        Intake/Output Summary (Last 24 hours) at 06/02/2023 0855 Last data filed at 06/02/2023 0620 Gross per 24 hour  Intake 902.45 ml  Output 2350 ml  Net -1447.55 ml   Filed Weights   06/01/23 1605 06/02/23 0702  Weight: 75.4 kg 75.4 kg    Examination: General:  chronically ill appearing older male lying upright in bed in NAD HEENT: MM pink/moist, pupils 3/r, anicteric Neuro: Awake, oriented to person, place, year, not month, contracted in all extremities CV: rr, NSR, no murmur, R femoral CVL site- oozy PULM:  non labored, clear anteriorly, diminished in bases, RA GI: soft, bs+, NT, suprapubic cath- cyu Extremities: warm/dry, no LE edema   CT abd/ pelvis> no acute findings, hepatic steatosis, decompressed bladder with suprapubic cath  Afebrile UOP 1.1L/ 12hrs Net -1.4  Labs reviewed> Na 116> 114, K 2.6, Cl 74, sCr 0.84, AG 15, phos <1, mag 2.3, WBC 15.8, normal coags  Resolved Hospital Problem list     Assessment & Plan:  Hyponatremia - recent poor PO intake, N/V w/ abd discomfort, on HTCZ - low serum osm on admit, appeared euvolemic,  on hydrochlorothiazide PTA - s/p 1L NS, HTS never started P:  - cont D5W, decrease to 50 - cont Na checks q 4 - remains euvolemic on exam, good UOP - urine Osm 448, uNa 13 - no acute findings on CT a/p - TSH and cortisol levels wnl - cont to hold home HCTZ - strict I/Os - remains hemodynamically stable   Hypokalemia Hypophos - getting Kphos replete.  Mag ok 2.2 - repeat renal panel at 1600, replete prn   Multiple sclerosis - cont home zanaflex, add back home baclofen, prn valium - cont supportive care  N/V Chronic varying constipation/ diarrhea Leukocytosis - CXR neg.  No acute  findings on CT a/P - trend CBC - monitor clinically off abx - N/V resolved currently - prn miralax   Poor IV access - R femoral line placed given no sites for PICC, poor PIV access - continue to watch site for worsening bleeding - coags wnl, H/H stable - try and place PIV as not on pressors, no HTS but needing for blood draws and IV meds currently   Best Practice (right click and "Reselect all SmartList Selections" daily)   Diet/type: Regular consistency (see orders) DVT prophylaxis: SCD GI prophylaxis: N/A Lines: N/A Foley:  N/A Code Status:  full code Last date of multidisciplinary goals of care discussion [discussed with sister at bedside]  - Ronald Pippins (sister) updated by phone 9/30  Labs   CBC: Recent Labs  Lab 06/01/23 1200 06/01/23 1713 06/02/23 0309  WBC 14.8* 15.0* 15.8*  NEUTROABS 12.8*  --   --   HGB 17.0 16.5 15.3  HCT 46.0 45.7 41.8  MCV 77.6* 79.5* 80.4  PLT 291 259 264    Basic Metabolic Panel: Recent Labs  Lab 06/01/23 1200 06/01/23 1320 06/01/23 1713 06/01/23 2332 06/02/23 0309 06/02/23 0740  NA 108* 109* 112* 115* 116* 114*  114*  K 2.5*  --   --   --   --  2.6*  CL <65*  --   --   --   --  74*  CO2 26  --   --   --   --  24  GLUCOSE 109*  --   --   --   --  115*  BUN 23  --   --   --   --  19  CREATININE 0.97  --   --   --   --  0.84  CALCIUM 9.2  --   --   --   --  8.3*  MG  --  2.3  --   --  2.3  --   PHOS  --   --   --   --  <1.0*  --    GFR: Estimated Creatinine Clearance: 97.2 mL/min (by C-G formula based on SCr of 0.84 mg/dL). Recent Labs  Lab 06/01/23 1200 06/01/23 1301 06/01/23 1713 06/02/23 0309  WBC 14.8*  --  15.0* 15.8*  LATICACIDVEN  --  1.5  --   --     Liver Function Tests: Recent Labs  Lab 06/01/23 1200  AST 33  ALT 31  ALKPHOS 157*  BILITOT 1.6*  PROT 8.8*  ALBUMIN 5.1*   Recent Labs  Lab 06/01/23 1200  LIPASE 34   No results for input(s): "AMMONIA" in the last 168 hours.  ABG     Component Value Date/Time   PHART 7.358 12/29/2017 1607   PCO2ART 44.3 12/29/2017 1607   PO2ART 414 (H) 12/29/2017 1607  HCO3 25.0 09/17/2018 0539   TCO2 29 03/21/2014 0945   ACIDBASEDEF 0.7 12/29/2017 1607   O2SAT 92.7 09/17/2018 0539     Coagulation Profile: Recent Labs  Lab 06/02/23 0309  INR 1.0    Cardiac Enzymes: No results for input(s): "CKTOTAL", "CKMB", "CKMBINDEX", "TROPONINI" in the last 168 hours.  HbA1C: No results found for: "HGBA1C"  CBG: No results for input(s): "GLUCAP" in the last 168 hours.    CCT: 35     Posey Boyer, MSN, AG-ACNP-BC [facility] 06/02/2023, 8:55 AM  See Amion for pager If no response to pager , please call [phone] until 7pm After 7:00 pm call Elink  [phone]

## 2023-06-02 NOTE — Plan of Care (Signed)
  Problem: Health Behavior/Discharge Planning: Goal: Ability to manage health-related needs will improve Outcome: Progressing   Problem: Clinical Measurements: Goal: Respiratory complications will improve Outcome: Progressing Goal: Cardiovascular complication will be avoided Outcome: Progressing   Problem: Nutrition: Goal: Adequate nutrition will be maintained Outcome: Progressing   Problem: Elimination: Goal: Will not experience complications related to bowel motility Outcome: Progressing

## 2023-06-02 NOTE — Progress Notes (Addendum)
eLink Physician-Brief Progress Note Patient Name: GUILHERME SCHWENKE DOB: 1961-07-29 MRN: [id number]   Date of Service  06/02/2023  HPI/Events of Note  62 year old gentleman with a history of MS who presents with severe hyponatremia in the setting of poor oral intake and hydrochlorothiazide use.    Goal sodium 114 x 9/30 at noon. Goal sodium 120 x 10/1 at noon.  eICU Interventions  Previously on D5W, recently discontinued. Given the slow change, will initiate NS.   2315 -patient has been having clear fluid drainage from the left ear soaking his pinna and somewhat soaking his pillow.  This is present during the day shift and has not increased in volume.  No associated fevers or chills.  No recent head trauma.  No recent ENT or head surgery.  Appears to be clear in nature and not associated with any other ENT symptoms.  Not sure what to make of this otorrhea in the absence of other symptoms.  Will request ground team/day team evaluation for otoscopic exam.  Intervention Category Intermediate Interventions: Electrolyte abnormality - evaluation and management  Rukaya Kleinschmidt 06/02/2023, 9:26 PM

## 2023-06-02 NOTE — Progress Notes (Signed)
This nurse noticed bleeding at the femoral line insertion site. This nurse notified CCM nurse Morrie Sheldon and was advised to monitor. Bleeding got worse after transferring to/from CT abdomen. Again, CCM nurse Morrie Sheldon and CCM doctor [employer] notified. An order for a sandbag was placed. Despite placing a sandbag, CCM doctor Vladimir Faster was notified for continual bleeding. CCM doctor Vladimir Faster then placed an order for surgicel. This nurse placed 1 topical pad and redressed central line.

## 2023-06-02 NOTE — Plan of Care (Signed)
  Problem: Education: Goal: Knowledge of General Education information will improve Description: Including pain rating scale, medication(s)/side effects and non-pharmacologic comfort measures Outcome: Progressing   Problem: Health Behavior/Discharge Planning: Goal: Ability to manage health-related needs will improve Outcome: Progressing   Problem: Clinical Measurements: Goal: Respiratory complications will improve Outcome: Progressing Goal: Cardiovascular complication will be avoided Outcome: Progressing   Problem: Activity: Goal: Risk for activity intolerance will decrease Outcome: Progressing   Problem: Coping: Goal: Level of anxiety will decrease Outcome: Progressing   Problem: Elimination: Goal: Will not experience complications related to urinary retention Outcome: Progressing   Problem: Pain Managment: Goal: General experience of comfort will improve Outcome: Progressing   Problem: Safety: Goal: Ability to remain free from injury will improve Outcome: Progressing   Problem: Clinical Measurements: Goal: Ability to maintain clinical measurements within normal limits will improve Outcome: Not Progressing Goal: Will remain free from infection Outcome: Not Progressing Goal: Diagnostic test results will improve Outcome: Not Progressing   Problem: Nutrition: Goal: Adequate nutrition will be maintained Outcome: Not Progressing   Problem: Elimination: Goal: Will not experience complications related to bowel motility Outcome: Not Progressing   Problem: Skin Integrity: Goal: Risk for impaired skin integrity will decrease Outcome: Not Progressing

## 2023-06-03 DIAGNOSIS — E871 Hypo-osmolality and hyponatremia: Secondary | ICD-10-CM | POA: Diagnosis not present

## 2023-06-03 LAB — URINALYSIS, W/ REFLEX TO CULTURE (INFECTION SUSPECTED)
Bilirubin Urine: NEGATIVE
Glucose, UA: >=500 mg/dL — AB
Ketones, ur: NEGATIVE mg/dL
Nitrite: NEGATIVE
Protein, ur: NEGATIVE mg/dL
Specific Gravity, Urine: 1.003 — ABNORMAL LOW (ref 1.005–1.030)
WBC, UA: >50 WBC/hpf (ref 0–5)
pH: 7 (ref 5.0–8.0)

## 2023-06-03 LAB — RENAL FUNCTION PANEL
Albumin: 3.9 g/dL (ref 3.5–5.0)
Albumin: 4.1 g/dL (ref 3.5–5.0)
Anion gap: 11 (ref 5–15)
Anion gap: 9 (ref 5–15)
BUN: 11 mg/dL (ref 8–23)
BUN: 11 mg/dL (ref 8–23)
CO2: 27 mmol/L (ref 22–32)
CO2: 28 mmol/L (ref 22–32)
Calcium: 8.4 mg/dL — ABNORMAL LOW (ref 8.9–10.3)
Calcium: 8.7 mg/dL — ABNORMAL LOW (ref 8.9–10.3)
Chloride: 75 mmol/L — ABNORMAL LOW (ref 98–111)
Chloride: 80 mmol/L — ABNORMAL LOW (ref 98–111)
Creatinine, Ser: 0.83 mg/dL (ref 0.61–1.24)
Creatinine, Ser: 0.84 mg/dL (ref 0.61–1.24)
GFR, Estimated: >60 mL/min (ref >60)
GFR, Estimated: >60 mL/min (ref >60)
Glucose, Bld: 151 mg/dL — ABNORMAL HIGH (ref 70–99)
Glucose, Bld: 108 mg/dL — ABNORMAL HIGH (ref 70–99)
Phosphorus: 1.4 mg/dL — ABNORMAL LOW (ref 2.5–4.6)
Phosphorus: 1.2 mg/dL — ABNORMAL LOW (ref 2.5–4.6)
Potassium: 2.7 mmol/L — CL (ref 3.5–5.1)
Potassium: 2.8 mmol/L — ABNORMAL LOW (ref 3.5–5.1)
Sodium: 113 mmol/L — CL (ref 135–145)
Sodium: 117 mmol/L — CL (ref 135–145)

## 2023-06-03 LAB — OSMOLALITY, URINE: Osmolality, Ur: 122 mosm/kg — ABNORMAL LOW (ref 300–900)

## 2023-06-03 LAB — CBC
HCT: 39.7 % (ref 39.0–52.0)
Hemoglobin: 14.4 g/dL (ref 13.0–17.0)
MCH: 29.1 pg (ref 26.0–34.0)
MCHC: 36.3 g/dL — ABNORMAL HIGH (ref 30.0–36.0)
MCV: 80.4 fL (ref 80.0–100.0)
Platelets: 220 10*3/uL (ref 150–400)
RBC: 4.94 MIL/uL (ref 4.22–5.81)
RDW: 12.2 % (ref 11.5–15.5)
WBC: 15.4 10*3/uL — ABNORMAL HIGH (ref 4.0–10.5)
nRBC: 0 % (ref 0.0–0.2)

## 2023-06-03 LAB — PHOSPHORUS: Phosphorus: 2.5 mg/dL (ref 2.5–4.6)

## 2023-06-03 LAB — BASIC METABOLIC PANEL
Anion gap: 12 (ref 5–15)
BUN: 10 mg/dL (ref 8–23)
CO2: 23 mmol/L (ref 22–32)
Calcium: 8.2 mg/dL — ABNORMAL LOW (ref 8.9–10.3)
Chloride: 90 mmol/L — ABNORMAL LOW (ref 98–111)
Creatinine, Ser: 0.61 mg/dL (ref 0.61–1.24)
GFR, Estimated: >60 mL/min (ref >60)
Glucose, Bld: 116 mg/dL — ABNORMAL HIGH (ref 70–99)
Potassium: 3.8 mmol/L (ref 3.5–5.1)
Sodium: 125 mmol/L — ABNORMAL LOW (ref 135–145)

## 2023-06-03 LAB — SODIUM
Sodium: 114 mmol/L — CL (ref 135–145)
Sodium: 116 mmol/L — CL (ref 135–145)
Sodium: 120 mmol/L — ABNORMAL LOW (ref 135–145)
Sodium: 121 mmol/L — ABNORMAL LOW (ref 135–145)
Sodium: 124 mmol/L — ABNORMAL LOW (ref 135–145)
Sodium: 126 mmol/L — ABNORMAL LOW (ref 135–145)

## 2023-06-03 LAB — SODIUM, URINE, RANDOM: Sodium, Ur: <10 mmol/L

## 2023-06-03 LAB — MAGNESIUM: Magnesium: 1.6 mg/dL — ABNORMAL LOW (ref 1.7–2.4)

## 2023-06-03 MED ORDER — SODIUM CHLORIDE 0.9 % IV SOLN: INTRAVENOUS | Status: DC | PRN

## 2023-06-03 MED ORDER — SODIUM CHLORIDE 0.9 % IV BOLUS
1000.0000 mL | Freq: Once | INTRAVENOUS | Status: AC
  Administered 2023-06-03: 1000 mL via INTRAVENOUS

## 2023-06-03 MED ORDER — POTASSIUM CHLORIDE 20 MEQ PO PACK
40.0000 meq | PACK | ORAL | Status: AC
  Administered 2023-06-03 (×3): 40 meq via ORAL
  Filled 2023-06-03 (×3): qty 2

## 2023-06-03 MED ORDER — SODIUM CHLORIDE 0.9% FLUSH
10.0000 mL | Freq: Two times a day (BID) | INTRAVENOUS | Status: DC
  Administered 2023-06-03: 30 mL
  Administered 2023-06-03 – 2023-06-06 (×6): 10 mL

## 2023-06-03 MED ORDER — SODIUM CHLORIDE 0.9 % IV SOLN
2.0000 g | INTRAVENOUS | Status: DC
  Administered 2023-06-03 – 2023-06-04 (×2): 2 g via INTRAVENOUS
  Filled 2023-06-03 (×2): qty 20

## 2023-06-03 MED ORDER — SODIUM CHLORIDE 0.9 % IV SOLN: INTRAVENOUS | Status: DC

## 2023-06-03 MED ORDER — POTASSIUM PHOSPHATES 15 MMOLE/5ML IV SOLN
30.0000 mmol | Freq: Once | INTRAVENOUS | Status: AC
  Administered 2023-06-03: 30 mmol via INTRAVENOUS
  Filled 2023-06-03: qty 10

## 2023-06-03 MED ORDER — EYE WASH OP SOLN
2.0000 [drp] | OPHTHALMIC | Status: DC | PRN
  Administered 2023-06-03: 2 [drp] via OPHTHALMIC
  Filled 2023-06-03: qty 118

## 2023-06-03 MED ORDER — POTASSIUM CHLORIDE CRYS ER 20 MEQ PO TBCR
20.0000 meq | EXTENDED_RELEASE_TABLET | Freq: Once | ORAL | Status: AC
  Administered 2023-06-03: 20 meq via ORAL
  Filled 2023-06-03: qty 1

## 2023-06-03 MED ORDER — POTASSIUM CHLORIDE 10 MEQ/100ML IV SOLN
10.0000 meq | INTRAVENOUS | Status: AC
  Administered 2023-06-03 (×3): 10 meq via INTRAVENOUS
  Filled 2023-06-03 (×3): qty 100

## 2023-06-03 MED ORDER — SODIUM CHLORIDE 0.9% FLUSH: 10.0000 mL | INTRAVENOUS | Status: DC | PRN

## 2023-06-03 MED ORDER — POLYVINYL ALCOHOL 1.4 % OP SOLN: 1.0000 [drp] | OPHTHALMIC | Status: DC | PRN

## 2023-06-03 MED ORDER — HEPARIN SODIUM (PORCINE) 5000 UNIT/ML IJ SOLN
5000.0000 [IU] | Freq: Three times a day (TID) | INTRAMUSCULAR | Status: DC
  Administered 2023-06-03 – 2023-06-06 (×9): 5000 [IU] via SUBCUTANEOUS
  Filled 2023-06-03 (×9): qty 1

## 2023-06-03 MED ORDER — MAGNESIUM SULFATE 4 GM/100ML IV SOLN
4.0000 g | Freq: Once | INTRAVENOUS | Status: AC
  Administered 2023-06-03: 4 g via INTRAVENOUS
  Filled 2023-06-03: qty 100

## 2023-06-03 NOTE — Progress Notes (Addendum)
PROGRESS NOTE  Samuel [name]:[id number] DOB: 09-Dec-1960 DOA: 06/01/2023 PCP: Annita Brod, MD  HPI/Recap of past 21 hours: 62 year old gentleman with a history of MS, hypothyroidism, chronic constipation, neurogenic bladder, who presented due to doing poorly over the last 3 to 4 days.  Associated with nausea, vomiting, abdominal distention, and constipation.  Lives with his sister currently.  They have a caregiver coming into the house  Workup revealed severe hyponatremia, was admitted by the critical care medicine team to the ICU.  After improvement of his serum sodium was transferred to hospitalist service on 06/03/2023.  06/03/2023: The patient was seen and examined at his bedside.  Serum sodium improving to 120 this morning.  Seen by nephrology.  Hyponatremia is felt to be due to nausea, vomiting, poor p.o. intake at home, and HCTZ effects.   Assessment/Plan: Principal Problem:   Hyponatremia  Severe hyponatremia with initial serum sodium of 108, multifactorial Secondary to nausea, vomiting, poor p.o. intake at home, and HCTZ effects Continue to hold off hydrochlorothiazide Received IV fluid hydration IV fluid NS at 100 cc/h held to avoid quick correction of serum sodium, currently at 124. Nephrology consulted, appreciate assistance. Serum sodium every 4 hours.  Refractory hypokalemia Refractory hypophosphatemia Replete intravenously. Repeat levels in the morning.    Multiple sclerosis - cont home zanaflex, add back home baclofen, prn valium - cont supportive care   N/V, resolved Chronic varying constipation/ diarrhea Leukocytosis UA positive for pyuria Obtain urine culture and peripheral blood cultures x 2 Treat empirically with Rocephin  Hypothyroidism Continue home levothyroxine  Seizure disorder Continue home regimen  GERD Continue home regimen   Critical care time: 55 minutes   Code Status: DNR  Family Communication: None at  bedside.  Disposition Plan: Likely discharge to home once nephrology signs off   Consultants: Nephrology  Procedures: None  Antimicrobials: Rocephin  DVT prophylaxis: Subcu heparin 3 times daily  Status is: Inpatient The patient requires at least 2 midnights for further evaluation and treatment of present condition.    Objective: Vitals:   06/03/23 1203 06/03/23 1300 06/03/23 1400 06/03/23 1500  BP:  (!) 108/90 (!) 95/51 (!) 98/57  Pulse:  98 83 86  Resp:  12 10 12   Temp: 98.8 F (37.1 C)     TempSrc: Oral     SpO2:  99% 99% 95%  Weight:      Height:        Intake/Output Summary (Last 24 hours) at 06/03/2023 1630 Last data filed at 06/03/2023 1537 Gross per 24 hour  Intake 2978.06 ml  Output 3900 ml  Net -921.94 ml   Filed Weights   06/01/23 1605 06/02/23 0702 06/03/23 0500  Weight: 75.4 kg 75.4 kg 77.7 kg    Exam:  General: 62 y.o. year-old male well developed well nourished in no acute distress.  Alert and oriented x3. Cardiovascular: Regular rate and rhythm with no rubs or gallops.  No thyromegaly or JVD noted.   Respiratory: Clear to auscultation with no wheezes or rales. Good inspiratory effort. Abdomen: Soft nontender nondistended with normal bowel sounds x4 quadrants. Musculoskeletal: Trace edema in lower extremities bilaterally. Psychiatry: Mood is appropriate for condition and setting   Data Reviewed: CBC: Recent Labs  Lab 06/01/23 1200 06/01/23 1713 06/02/23 0309 06/03/23 0517  WBC 14.8* 15.0* 15.8* 15.4*  NEUTROABS 12.8*  --   --   --   HGB 17.0 16.5 15.3 14.4  HCT 46.0 45.7 41.8 39.7  MCV 77.6* 79.5* 80.4 80.4  PLT 291 259 264 220   Basic Metabolic Panel: Recent Labs  Lab 06/01/23 1200 06/01/23 1320 06/01/23 1713 06/02/23 0309 06/02/23 0740 06/02/23 0936 06/02/23 2322 06/03/23 0311 06/03/23 0517 06/03/23 0750 06/03/23 1107 06/03/23 1504  NA 108* 109*   < > 116* 114*  114*   < > 114*  113* 116* 117* 120* 121* 124*  K  2.5*  --   --   --  2.6*  --  2.7*  --  2.8*  --   --   --   CL <65*  --   --   --  74*  --  75*  --  80*  --   --   --   CO2 26  --   --   --  24  --  27  --  28  --   --   --   GLUCOSE 109*  --   --   --  115*  --  151*  --  108*  --   --   --   BUN 23  --   --   --  19  --  11  --  11  --   --   --   CREATININE 0.97  --   --   --  0.84  --  0.83  --  0.84  --   --   --   CALCIUM 9.2  --   --   --  8.3*  --  8.4*  --  8.7*  --   --   --   MG  --  2.3  --  2.3  --   --   --   --   --   --   --   --   PHOS  --   --   --  <1.0*  --   --  1.4*  --  1.2*  --   --   --    < > = values in this interval not displayed.   GFR: Estimated Creatinine Clearance: 100.2 mL/min (by C-G formula based on SCr of 0.84 mg/dL). Liver Function Tests: Recent Labs  Lab 06/01/23 1200 06/02/23 2322 06/03/23 0517  AST 33  --   --   ALT 31  --   --   ALKPHOS 157*  --   --   BILITOT 1.6*  --   --   PROT 8.8*  --   --   ALBUMIN 5.1* 3.9 4.1   Recent Labs  Lab 06/01/23 1200  LIPASE 34   No results for input(s): "AMMONIA" in the last 168 hours. Coagulation Profile: Recent Labs  Lab 06/02/23 0309  INR 1.0   Cardiac Enzymes: No results for input(s): "CKTOTAL", "CKMB", "CKMBINDEX", "TROPONINI" in the last 168 hours. BNP (last 3 results) No results for input(s): "PROBNP" in the last 8760 hours. HbA1C: No results for input(s): "HGBA1C" in the last 72 hours. CBG: No results for input(s): "GLUCAP" in the last 168 hours. Lipid Profile: No results for input(s): "CHOL", "HDL", "LDLCALC", "TRIG", "CHOLHDL", "LDLDIRECT" in the last 72 hours. Thyroid Function Tests: Recent Labs    06/01/23 2332  TSH 1.589  FREET4 1.12   Anemia Panel: No results for input(s): "VITAMINB12", "FOLATE", "FERRITIN", "TIBC", "IRON", "RETICCTPCT" in the last 72 hours. Urine analysis:    Component Value Date/Time   COLORURINE YELLOW 06/01/2023 1308   APPEARANCEUR HAZY (A) 06/01/2023 1308   LABSPEC 1.013 06/01/2023 1308    PHURINE  5.0 06/01/2023 1308   GLUCOSEU >=500 (A) 06/01/2023 1308   HGBUR MODERATE (A) 06/01/2023 1308   BILIRUBINUR NEGATIVE 06/01/2023 1308   KETONESUR 80 (A) 06/01/2023 1308   PROTEINUR 100 (A) 06/01/2023 1308   UROBILINOGEN 0.2 06/02/2015 0459   NITRITE NEGATIVE 06/01/2023 1308   LEUKOCYTESUR MODERATE (A) 06/01/2023 1308   Sepsis Labs: @LABRCNTIP (procalcitonin:4,lacticidven:4)  ) Recent Results (from the past 240 hour(s))  SARS Coronavirus 2 by RT PCR (hospital order, performed in [facility] hospital lab) *cepheid single result test* Anterior Nasal Swab     Status: None   Collection Time: 06/01/23  7:36 AM   Specimen: Anterior Nasal Swab  Result Value Ref Range Status   SARS Coronavirus 2 by RT PCR NEGATIVE NEGATIVE Final    Comment: (NOTE) SARS-CoV-2 target nucleic acids are NOT DETECTED.  The SARS-CoV-2 RNA is generally detectable in upper and lower respiratory specimens during the acute phase of infection. The lowest concentration of SARS-CoV-2 viral copies this assay can detect is 250 copies / mL. A negative result does not preclude SARS-CoV-2 infection and should not be used as the sole basis for treatment or other patient management decisions.  A negative result may occur with improper specimen collection / handling, submission of specimen other than nasopharyngeal swab, presence of viral mutation(s) within the areas targeted by this assay, and inadequate number of viral copies (<250 copies / mL). A negative result must be combined with clinical observations, patient history, and epidemiological information.  Fact Sheet for Patients:   RoadLapTop.co.za  Fact Sheet for Healthcare Providers: http://kim-miller.com/  This test is not yet approved or  cleared by the [place] FDA and has been authorized for detection and/or diagnosis of SARS-CoV-2 by FDA under an Emergency Use Authorization (EUA).  This EUA will  remain in effect (meaning this test can be used) for the duration of the COVID-19 declaration under Section 564(b)(1) of the Act, 21 U.S.C. section 360bbb-3(b)(1), unless the authorization is terminated or revoked sooner.  Performed at [facility], [address] [place], [place] [address]   MRSA Next Gen by PCR, Nasal     Status: None   Collection Time: 06/01/23  7:36 AM   Specimen: Nasal Mucosa; Nasal Swab  Result Value Ref Range Status   MRSA by PCR Next Gen NOT DETECTED NOT DETECTED Final    Comment: (NOTE) The GeneXpert MRSA Assay (FDA approved for NASAL specimens only), is one component of a comprehensive MRSA colonization surveillance program. It is not intended to diagnose MRSA infection nor to guide or monitor treatment for MRSA infections. Test performance is not FDA approved in patients less than 61 years old. Performed at [facility], [address] [place], [place] [address]       Studies: No results found.  Scheduled Meds:  ARIPiprazole  5 mg Oral Daily   baclofen  40 mg Oral TID   Chlorhexidine Gluconate Cloth  6 each Topical Daily   levothyroxine  125 mcg Oral Daily   mirabegron ER  50 mg Oral Daily   mirtazapine  30 mg Oral QHS   pantoprazole  40 mg Oral Daily   sodium chloride flush  10-40 mL Intracatheter Q12H   tiZANidine  8 mg Oral TID   topiramate  50 mg Oral QHS    Continuous Infusions:  sodium chloride 100 mL/hr at 06/03/23 1537     LOS: 2 days     Darlin Drop, MD [facility] Pager [phone]  If 7PM-7AM, please contact night-coverage [email or web address] Password [email or web address] 06/03/2023, 4:30 PM

## 2023-06-03 NOTE — Consult Note (Signed)
Renal Service Consult Note [name]  Samuel [name] 06/03/2023 [name], MD Requesting Physician: [name]  Reason for Consult: Hyponatremia  HPI: The patient is a 62 y.o. year-old w/ PMH as below who presented to ED from home for N/V/D x 4 days. Worsening malaise, FTT, poor po intake. Hx of MS. Na+ was 109. Pt was rx'd with 3% saline for < 24 hrs then was switched to NS 0.9%. Na+ improved up to 115 yesterday and 120-121 today. Low phos and K+ were also addressed. Asked to see for hyponatremia.   Pt seen in SDU bed. No c/o's. Looks bedbound. No cough, N/V or CP, no sob, no leg swelling.   ROS - denies CP, no joint pain, no HA, no blurry vision, no rash, no diarrhea, no nausea/ vomiting, no dysuria, no difficulty voiding   Past Medical History  Past Medical History:  Diagnosis Date   Anxiety    Bladder calculi    Chronic back pain    Chronic fatigue    Depression    Foley catheter in place    Gait disorder    GERD (gastroesophageal reflux disease)    History of kidney stones    Hyperlipidemia    Hypertension    Hypothyroidism    Incontinence of urine    Multiple sclerosis (HCC) NEUROLOGIST-- [name]   dx 1993, JC virus negative 2/14---  CURRENTLY RECEIVING TYSABRI IV TREATMENT   Neurogenic bladder    Neurogenic bowel    intermittant constipation and diarrhea   Pneumonia    dx  03-03-2014--  admitted for iv and oral antibiotics   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   Urinary retention    Vitamin D deficiency    Yeast infection    genital area   Past Surgical History  Past Surgical History:  Procedure Laterality Date   APPENDECTOMY  age 36   CYSTOSCOPY N/A 03/21/2014   Procedure: CYSTOSCOPY TREATMENT OF BLADDER CALCULI;  Surgeon: [name], MD;  Location: [facility];  Service: Urology;  Laterality: N/A;   CYSTOSCOPY WITH BIOPSY N/A 12/29/2017   Procedure: TRANSURETHRAL RESECTION OF BLADDER TUMOR;  Surgeon: [name], MD;  Location: [facility];  Service: Urology;  Laterality: N/A;   CYSTOSCOPY WITH RETROGRADE PYELOGRAM, URETEROSCOPY AND STENT PLACEMENT Right 12/11/2017   Procedure: CYSTOSCOPY WITH RETROGRADE PYELOGRAM, URETEROSCOPY AND STENT PLACEMENT RIGHT;  Surgeon: [name], MD;  Location: [facility];  Service: Urology;  Laterality: Right;   CYSTOSCOPY WITH RETROGRADE PYELOGRAM, URETEROSCOPY AND STENT PLACEMENT Right 12/29/2017   Procedure: CYSTOSCOPY WITH RIGHT  RETROGRADE PYELOGRAM, URETEROSCOPY/LASER LITHOTRIPSY AND STENT PLACEMENT;  Surgeon: [name], MD;  Location: [facility];  Service: Urology;  Laterality: Right;   INSERTION OF SUPRAPUBIC CATHETER N/A 03/21/2014   Procedure: INSERTION OF SUPRAPUBIC CATHETER;  Surgeon: [name], MD;  Location: [facility];  Service: Urology;  Laterality: N/A;   NASAL SEPTUM SURGERY  1996   NEGATIVE SLEEP STUDY  1996   TRANSTHORACIC ECHOCARDIOGRAM  02-07-2004   MILD LVH/  EF 55-65%   Family History  Family History  Problem Relation Age of Onset   Stroke Father    Hypothyroidism Sister    Cirrhosis Brother    Social History  reports that he quit smoking about 37 years ago. His smoking use included cigarettes. He started smoking about 45 years ago. He has a 12 pack-year smoking history. He has never used smokeless tobacco. He reports that  he does not drink alcohol and does not use drugs. Allergies  Allergies  Allergen Reactions   Penicillins Other (See Comments)    > 20 yr ago, doesn't recall nature of reaction (didn't require hospitalization) Tolerates Ancef, Rocephin, and Cefepime per chart review Does not recall having taken amox/amp since initial rxn; none documented in Epic    Primaxin [Imipenem] Other (See Comments)    Seizure    Home medications Prior to Admission medications   Medication Sig Start Date End Date Taking? Authorizing Provider  acetic acid 0.25 % irrigation Irrigate with as directed daily. Instill 30ml and  clamp tube for 30 minutes then drain. 06/03/15  Yes [name], MD  ARIPiprazole (ABILIFY) 5 MG tablet Take 5 mg by mouth daily.   Yes [provider]  baclofen (LIORESAL) 20 MG tablet Take 40 mg by mouth 3 (three) times daily.   Yes [provider]  buPROPion (WELLBUTRIN XL) 300 MG 24 hr tablet Take 300 mg by mouth daily.   Yes [provider]  diazepam (VALIUM) 5 MG tablet Take 5 mg by mouth daily.   Yes [provider]  levothyroxine (SYNTHROID, LEVOTHROID) 125 MCG tablet Take 125 mcg by mouth daily. 05/22/15  Yes [provider]  lisinopril-hydrochlorothiazide (ZESTORETIC) 20-25 MG tablet Take 1 tablet by mouth daily.   Yes [provider]  loperamide (IMODIUM) 2 MG capsule Take by mouth as needed for diarrhea or loose stools.   Yes [provider]  magnesium citrate SOLN Take 0.5 Bottles by mouth daily as needed for moderate constipation or severe constipation.   Yes [provider]  Magnesium Hydroxide (MILK OF MAGNESIA PO) Take 13 mLs by mouth as needed.   Yes [provider]  mirtazapine (REMERON) 30 MG tablet Take 30 mg by mouth at bedtime.   Yes [provider]  MYRBETRIQ 50 MG TB24 tablet  02/19/19  Yes [provider]  omeprazole (PRILOSEC) 20 MG capsule Take 20 mg by mouth every morning.    Yes [provider]  ondansetron (ZOFRAN-ODT) 8 MG disintegrating tablet Take 8 mg by mouth 2 (two) times daily. 05/30/23  Yes [provider]  polyethylene glycol (MIRALAX / GLYCOLAX) packet Take 17 g by mouth daily. 04/24/18  Yes [name], MD  tiZANidine (ZANAFLEX) 4 MG tablet Take 8 mg by mouth 3 (three) times daily. 05/13/23  Yes [provider]  topiramate (TOPAMAX) 50 MG tablet Take 50 mg by mouth at bedtime.   Yes [provider]  traMADol (ULTRAM) 50 MG tablet Take 50 mg by mouth every 6 (six) hours as needed for moderate pain.   Yes [provider]   Vitamins A & D (VITAMIN A & D) ointment Apply 1 application topically as needed for dry skin.   Yes [provider]  cloNIDine (CATAPRES) 0.1 MG tablet Take 0.1 mg by mouth daily as needed (Gout). Patient not taking: Reported on 06/01/2023    [provider]  liver oil-zinc oxide (DESITIN) 40 % ointment Apply 1 application topically as needed for irritation. Patient not taking: Reported on 06/01/2023    [provider]  Probiotic Product (PROBIOTIC-10 PO) Take by mouth daily. Patient not taking: Reported on 06/01/2023    [provider]  Vitamin D, Ergocalciferol, (DRISDOL) 50000 UNITS CAPS Take 50,000 Units by mouth every 7 (seven) days. Friday only Patient not taking: Reported on 06/01/2023    [provider]     Vitals:   06/03/23  1000 06/03/23 1100 06/03/23 1200 06/03/23 1203  BP: 139/75 101/64 106/63   Pulse: 89 98 92   Resp: 17 11 (!) 7   Temp:    98.8 F (37.1 C)  TempSrc:    Oral  SpO2: 98% 100% 98%   Weight:      Height:       Exam Gen alert, no distress, chronically ill appearing No rash, cyanosis or gangrene Sclera anicteric, throat clear  No jvd or bruits Chest clear bilat to bases, no rales/ wheezing RRR no MRG Abd soft ntnd no mass or ascites +bs GU suprapubic cath in place MS no joint effusions or deformity Ext no LE or UE edema, no wounds or ulcers Neuro is alert, Ox 3, has contractures of all extremities    Home meds - baclofen, bupropion, valium, lisinopril/hydrochlorothiazide, mom, imodium, zofran, miralax, tramadol, clonidine 0.1 mg every day prn, abilify, synthroid, mirtazapine, myrbetriq, prilosec, topamax, prns     UA 9/29 - glu > 500, ket 80, prot 100, many bact, 0-5 rbc/epi, >50 wbc   UNa 9/29 --> 13, UOsm 448   UNa 10/1 --> <10   9/29 got 3% saline at 50 cc/hr, not sure how long, dc'd same day   9/29 1 L NS bolus   9/30 IV KCl and KPhos   9/30 NS at 100 cc/hr from 9pm to 10 am this morning   9/30  got  D5W at 50 cc/hr from 1 am to 5 pm   Na + 109 on admit 9/29, improved to 115 on 9/30 and this am is up to 120-121    Assessment/ Plan: Hyponatremia - hypovolemic due to N/V and poor po intake at home+ hydrochlorothiazide effects. Pt was rx'd w/ short course of 3% saline, then with 0.9% saline and has responded well to both. Na+ improved from 109 on admit 9/29 to 115 on 9/30 and is up to 120 this am.  Urine lytes are c/w vol depletion. TSH and cortisol levels were wnl. Home hydrochlorothiazide was held. On exam pt is not vol overloaded, but dry to euvolemic. Will bolus 1 L NS and continue NS 0.9% at 100 cc/hr. Follow Na+ levels every 6 hrs. Will follow.  Hypokalemia - improving Hypophosphatemia - improving Multiple sclerosis      Vinson Moselle  MD [facility] 06/03/2023, 1:33 PM  Recent Labs  Lab 06/02/23 0309 06/02/23 0740 06/02/23 2322 06/03/23 0517  HGB 15.3  --   --  14.4  ALBUMIN  --   --  3.9 4.1  CALCIUM  --    < > 8.4* 8.7*  PHOS <1.0*  --  1.4* 1.2*  CREATININE  --    < > 0.83 0.84  K  --    < > 2.7* 2.8*   < > = values in this interval not displayed.   Inpatient medications:  ARIPiprazole  5 mg Oral Daily   baclofen  40 mg Oral TID   Chlorhexidine Gluconate Cloth  6 each Topical Daily   levothyroxine  125 mcg Oral Daily   mirabegron ER  50 mg Oral Daily   mirtazapine  30 mg Oral QHS   pantoprazole  40 mg Oral Daily   sodium chloride flush  10-40 mL Intracatheter Q12H   tiZANidine  8 mg Oral TID   topiramate  50 mg Oral QHS    sodium chloride 100 mL/hr at 06/03/23 1233   potassium PHOSPHATE IVPB (in mmol) 85 mL/hr at 06/03/23 1233   acetaminophen, diazepam,  docusate sodium, eye wash, ondansetron (ZOFRAN) IV, polyethylene glycol, polyvinyl alcohol, sodium chloride flush

## 2023-06-03 NOTE — TOC CM/SW Note (Signed)
TOC Brief Assessment   Insurance and Status Insurance coverage has been reviewed  Patient has primary care physician Yes  Home environment has been reviewed Yes  Prior level of function: Patient has MS, lives with his sister who helps take care of him and he has a caregiver coming in on a regular basis.  Prior/Current Home Services Current home servicesPrior/Current Home Services. Current home services. 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 Taken on 06/03/23 1835  Social Determinants of Health Reivew SDOH reviewed no interventions necessary  Readmission risk has been reviewed Yes  Transition of care needs no transition of care needs at this time

## 2023-06-03 NOTE — Plan of Care (Signed)

## 2023-06-04 DIAGNOSIS — E871 Hypo-osmolality and hyponatremia: Secondary | ICD-10-CM | POA: Diagnosis not present

## 2023-06-04 LAB — MAGNESIUM: Magnesium: 2.3 mg/dL (ref 1.7–2.4)

## 2023-06-04 LAB — BASIC METABOLIC PANEL
Anion gap: 10 (ref 5–15)
BUN: 10 mg/dL (ref 8–23)
CO2: 25 mmol/L (ref 22–32)
Calcium: 8.6 mg/dL — ABNORMAL LOW (ref 8.9–10.3)
Chloride: 94 mmol/L — ABNORMAL LOW (ref 98–111)
Creatinine, Ser: 0.6 mg/dL — ABNORMAL LOW (ref 0.61–1.24)
GFR, Estimated: >60 mL/min (ref >60)
Glucose, Bld: 93 mg/dL (ref 70–99)
Potassium: 4.1 mmol/L (ref 3.5–5.1)
Sodium: 129 mmol/L — ABNORMAL LOW (ref 135–145)

## 2023-06-04 LAB — PHOSPHORUS: Phosphorus: 2.5 mg/dL (ref 2.5–4.6)

## 2023-06-04 MED ORDER — EYE WASH OP SOLN: 2.0000 [drp] | OPHTHALMIC | Status: DC | PRN

## 2023-06-04 NOTE — Progress Notes (Signed)
[facility] Progress Note  Subjective: Na+ 129 today  Vitals:   06/04/23 1000 06/04/23 1100 06/04/23 1130 06/04/23 1200  BP: 117/62 117/63  130/73  Pulse: 100 93  (!) 103  Resp: (!) 9 (!) 9  14  Temp:   97.9 F (36.6 C)   TempSrc:   Oral   SpO2: 96% 95%  95%  Weight:      Height:        Exam: Gen alert, no distress, chronically ill appearing No rash, cyanosis or gangrene Sclera anicteric, throat clear  No jvd or bruits Chest clear bilat to bases, no rales/ wheezing RRR no MRG Abd soft ntnd no mass or ascites +bs GU suprapubic cath in place MS no joint effusions or deformity Ext no LE or UE edema, no wounds or ulcers Neuro is alert, Ox 3, has contractures of all extremities     Home meds - baclofen, bupropion, valium, lisinopril/hydrochlorothiazide, mom, imodium, zofran, miralax, tramadol, clonidine 0.1 mg every day prn, abilify, synthroid, mirtazapine, myrbetriq, prilosec, topamax, prns       UA 9/29 - glu > 500, ket 80, prot 100, many bact, 0-5 rbc/epi, >50 wbc   UNa 9/29 --> 13, UOsm 448   UNa 10/1 --> <10   9/29 got 3% saline at 50 cc/hr, not sure how long, dc'd same day   9/29 1 L NS bolus   9/30 IV KCl and KPhos   9/30 NS at 100 cc/hr from 9pm to 10 am this morning   9/30  got D5W at 50 cc/hr from 1 am to 5 pm   Na + 109 on admit 9/29, improved to 115 on 9/30 and this am is up to 120-121       Assessment/ Plan: Hyponatremia - hypovolemic due to N/V and poor po intake at home+ hydrochlorothiazide effects. Pt was rx'd w/ short course of 3% saline, then with 0.9% saline and has responded well to both. Na+ improved from 109 on admit 9/29 to 115 on 9/30 and is up to 120 this am.  Urine lytes are c/w vol depletion. TSH and cortisol levels were wnl. Home hydrochlorothiazide was held. On exam pt is not vol overloaded, but dry to euvolemic. We gave additional bolus 1 L NS yesterday and continued NS 0.9% at 100 cc/hr. Na+ continues to improve up to 129 today.  Would leave Na+ 0.9% running another 1-2 days if he still be here. No other suggestions. Will sign off.  Hypokalemia - improving Hypophosphatemia - improving Multiple sclerosis         Rob Arlean Hopping MD  [facility] 06/04/2023, 2:00 PM  Recent Labs  Lab 06/02/23 0309 06/02/23 0740 06/02/23 2322 06/03/23 0517 06/03/23 1732 06/04/23 0604  HGB 15.3  --   --  14.4  --   --   ALBUMIN  --   --  3.9 4.1  --   --   CALCIUM  --    < > 8.4* 8.7* 8.2* 8.6*  PHOS <1.0*  --  1.4* 1.2* 2.5 2.5  CREATININE  --    < > 0.83 0.84 0.61 0.60*  K  --    < > 2.7* 2.8* 3.8 4.1   < > = values in this interval not displayed.   No results for input(s): "IRON", "TIBC", "FERRITIN" in the last 168 hours. Inpatient medications:  ARIPiprazole  5 mg Oral Daily   baclofen  40 mg Oral TID   Chlorhexidine Gluconate Cloth  6 each Topical Daily  heparin injection (subcutaneous)  5,000 Units Subcutaneous Q8H   levothyroxine  125 mcg Oral Daily   mirabegron ER  50 mg Oral Daily   mirtazapine  30 mg Oral QHS   pantoprazole  40 mg Oral Daily   sodium chloride flush  10-40 mL Intracatheter Q12H   tiZANidine  8 mg Oral TID   topiramate  50 mg Oral QHS    sodium chloride 0 mL/hr at 06/03/23 1837   cefTRIAXone (ROCEPHIN)  IV Stopped (06/03/23 1754)   sodium chloride, acetaminophen, diazepam, docusate sodium, eye wash, ondansetron (ZOFRAN) IV, polyethylene glycol, polyvinyl alcohol, sodium chloride flush

## 2023-06-04 NOTE — Progress Notes (Signed)
PT Cancellation Note  Patient Details Name: Samuel [name] MRN: [id number] DOB: [birth year]   Cancelled Treatment:    Reason Eval/Treat Not Completed: PT screened, no needs identified, will sign off. Unfortunately, patient is total care and bedbound. No skilled PT needs at this time, longstanding LE contractures present. Blanchard Kelch PT Acute Rehabilitation Services Office [phone] Weekend [phone]   Rada Hay 06/04/2023, 9:38 AM

## 2023-06-04 NOTE — Progress Notes (Signed)
  Progress Note   Patient: Samuel [name] ZDG:[id number] DOB: 03/17/1961 DOA: 06/01/2023     3 DOS: the patient was seen and examined on 06/04/2023   Brief hospital course: 62 year old gentleman with a history of MS, hypothyroidism, chronic constipation, neurogenic bladder, who presented due to doing poorly over the last 3 to 4 days.  Associated with nausea, vomiting, abdominal distention, and constipation.  Lives with his sister currently.  They have a caregiver coming into the house   Workup revealed severe hyponatremia, was admitted by the critical care medicine team to the ICU.  After improvement of his serum sodium was transferred to hospitalist service on 06/03/2023.  Assessment and Plan: Severe hyponatremia with initial serum sodium of 108, multifactorial Secondary to nausea, vomiting, poor p.o. intake at home, and HCTZ effects Continue to hold off hydrochlorothiazide Cont IV fluid hydration per Nephrology Na improved to 129 today Recheck bmet in AM   Refractory hypokalemia Refractory hypophosphatemia Replete intravenously. Repeat levels in the morning.    Multiple sclerosis - cont home zanaflex, add back home baclofen, prn valium - cont supportive care   N/V, resolved Chronic varying constipation/ diarrhea Leukocytosis UA positive for pyuria Urine cx pending Given empiric Rocephin   Hypothyroidism Continue home levothyroxine Recent TSH and free T4 within normal limits   Seizure disorder Continue home regimen   GERD Continue home regimen      Subjective: Feeling better today.   Physical Exam: Vitals:   06/04/23 1000 06/04/23 1100 06/04/23 1130 06/04/23 1200  BP: 117/62 117/63  130/73  Pulse: 100 93  (!) 103  Resp: (!) 9 (!) 9  14  Temp:   97.9 F (36.6 C)   TempSrc:   Oral   SpO2: 96% 95%  95%  Weight:      Height:       General exam: Awake, laying in bed, in nad Respiratory system: Normal respiratory effort, no wheezing Cardiovascular system: regular  rate, s1, s2 Gastrointestinal system: Soft, nondistended, positive BS Central nervous system: CN2-12 grossly intact, strength intact Extremities: Perfused, no clubbing Skin: Normal skin turgor, no notable skin lesions seen Psychiatry: Mood normal // no visual hallucinations   Data Reviewed:  Labs reviewed: Na 129, K 4.1, Cr 0.6, WBC 15.4, Hgb 14.4   Family Communication: Pt in room, family not at bedside  Disposition: Status is: Inpatient Remains inpatient appropriate because: severity of illness  Planned Discharge Destination: Home     Author: Rickey Barbara, MD 06/04/2023 2:25 PM  For on call review [email or web address].

## 2023-06-04 NOTE — Plan of Care (Signed)

## 2023-06-04 NOTE — Hospital Course (Signed)
62 year old gentleman with a history of MS, hypothyroidism, chronic constipation, neurogenic bladder, who presented due to doing poorly over the last 3 to 4 days.  Associated with nausea, vomiting, abdominal distention, and constipation.  Lives with his sister currently.  They have a caregiver coming into the house   Workup revealed severe hyponatremia, was admitted by the critical care medicine team to the ICU.  After improvement of his serum sodium was transferred to hospitalist service on 06/03/2023.

## 2023-06-04 NOTE — Evaluation (Signed)
Occupational Therapy Evaluation Patient Details Name: Samuel [name] MRN: [id number] DOB: 1961/09/02 Today's Date: 06/04/2023   History of Present Illness Patient is a 62 year old male who presented with nausea, abdominal distension, and constipation. Patient was admitted with severe hyponatremia, refractory hypokalemia and hypophosphatemia. PMH: MS, seizure disorder, contractures.   Clinical Impression   Patient evaluated by Occupational Therapy with no further acute OT needs identified. All education has been completed and the patient has no further questions. Patient was able to tolerate bilateral palm guards for 5 hours on this date. Recommend patient wear them daily to preserve skin integrity.  See below for any follow-up Occupational Therapy or equipment needs. OT is signing off. Thank you for this referral.        If plan is discharge home, recommend the following: Two people to help with walking and/or transfers;Two people to help with bathing/dressing/bathroom;Assistance with feeding;Direct supervision/assist for financial management;Help with stairs or ramp for entrance;Assistance with cooking/housework;Assist for transportation;Direct supervision/assist for medications management    Functional Status Assessment  Patient has not had a recent decline in their functional status  Equipment Recommendations  None recommended by OT       Precautions / Restrictions Precautions Precaution Comments: contractures BLE and RUE and L hand Restrictions Weight Bearing Restrictions: No      Mobility Bed Mobility               General bed mobility comments: TD           ADL either performed or assessed with clinical judgement   ADL Overall ADL's : At baseline         General ADL Comments: patient reported being TD for ADLs at baseline. patient reported living with sister and near brother who help him with all ADLs. patient reported that he has help for All tasks including  feeding. patient reported he liked to watch tv and the birds on bird feeder outside of his window. patient is able to use chin to hit touch pad call light.bilateral palm guards were introduced on this date. patient noted to have growth of nails that patient did tell therapist about prior to movement. patient was able to tolerate donning of bilateral palm guard and educated on usage of them in place of wash cloths to protect skin. patient verbalzied understanding. nurse educated on the same.      Pertinent Vitals/Pain Pain Assessment Pain Assessment: No/denies pain     Extremity/Trunk Assessment Upper Extremity Assessment Upper Extremity Assessment: RUE deficits/detail;LUE deficits/detail RUE Deficits / Details: flexion contracture of wrist and elbow. elbow has about 5 degrees of movement with redness in crevace. nurse made aware. patient has no ROM of UE at this time. LUE Deficits / Details: noted to have anterior positioning of L humerus in socket at this time. patient has no ROM of UE with contractures noted in all digits and shoulder. patient able to tolerate elbow ROM with elbow noted to rest in extension.           Communication     Cognition Arousal: Alert Behavior During Therapy: WFL for tasks assessed/performed Overall Cognitive Status: Within Functional Limits for tasks assessed         General Comments: plesant                Home Living Family/patient expects to be discharged to:: Private residence Living Arrangements: Other relatives Available Help at Discharge: Family;Personal care attendant Type of Home: House  Additional Comments: 6 x week caregivers for 3 hours a day  Lives With: Family    Prior Functioning/Environment Prior Level of Function : Needs assist       Physical Assist : ADLs (physical);Mobility (physical) Mobility (physical): Bed mobility;Transfers ADLs (physical): Grooming;Dressing;Toileting;IADLs;Bathing;Feeding Mobility  Comments: bed bound ADLs Comments: total care, needs to be fed        OT Problem List: Impaired UE functional use      OT Treatment/Interventions:      OT Goals(Current goals can be found in the care plan section) Acute Rehab OT Goals OT Goal Formulation: All assessment and education complete, DC therapy  OT Frequency:         AM-PAC OT "6 Clicks" Daily Activity     Outcome Measure Help from another person eating meals?: Total Help from another person taking care of personal grooming?: Total Help from another person toileting, which includes using toliet, bedpan, or urinal?: Total Help from another person bathing (including washing, rinsing, drying)?: Total Help from another person to put on and taking off regular upper body clothing?: Total Help from another person to put on and taking off regular lower body clothing?: Total 6 Click Score: 6   End of Session Equipment Utilized During Treatment: Other (comment) (palm guards) Nurse Communication: Other (comment) (palm guard usage)  Activity Tolerance: Patient tolerated treatment well Patient left: in bed;with call bell/phone within reach  OT Visit Diagnosis: Other (comment) (contractures)                Time: 1610-9604 OT Time Calculation (min): 18 min Charges:  OT General Charges $OT Visit: 1 Visit OT Evaluation $OT Eval Low Complexity: 1 Low  [name] OTR/L, MS Acute Rehabilitation Department Office# [phone]   [name] 06/04/2023, 3:59 PM

## 2023-06-04 NOTE — Evaluation (Signed)
Speech Language Pathology Evaluation Patient Details Name: Samuel [name] MRN: [id number] DOB: [birth year] Today's Date: 06/04/2023 Time: 8295-6213 SLP Time Calculation (min) (ACUTE ONLY): 24 min  Problem List:  Patient Active Problem List   Diagnosis Date Noted   Hyponatremia 06/01/2023   Acute metabolic encephalopathy 09/16/2018   Hypokalemia 09/16/2018   Pressure injury of skin 04/21/2018   Acute lower UTI 04/21/2018   Ureteral calculus 12/29/2017   Ureteral stone 12/29/2017   Sepsis (HCC) 12/09/2017   Seizure (HCC)    Essential hypertension 10/10/2015   Nausea & vomiting 10/10/2015   Malfunction of Foley catheter (HCC)    Malnutrition of moderate degree (HCC) 07/14/2014   History of ESBL Klebsiella pneumoniae infection 07/14/2014   UTI (urinary tract infection) 07/10/2014   Neurogenic bladder 03/21/2014   Acute retention of urine 03/03/2014   PNA (pneumonia) 03/02/2014   Pneumonia 03/02/2014   Multiple sclerosis (HCC) 04/19/2013   Spastic hemiplegia affecting left nondominant side (HCC) 04/19/2013   Past Medical History:  Past Medical History:  Diagnosis Date   Anxiety    Bladder calculi    Chronic back pain    Chronic fatigue    Depression    Foley catheter in place    Gait disorder    GERD (gastroesophageal reflux disease)    History of kidney stones    Hyperlipidemia    Hypertension    Hypothyroidism    Incontinence of urine    Multiple sclerosis (HCC) NEUROLOGIST-- [name]   dx 1993, JC virus negative 2/14---  CURRENTLY RECEIVING TYSABRI IV TREATMENT   Neurogenic bladder    Neurogenic bowel    intermittant constipation and diarrhea   Pneumonia    dx  03-03-2014--  admitted for iv and oral antibiotics   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   Urinary retention    Vitamin D deficiency    Yeast infection    genital area   Past Surgical History:  Past Surgical History:  Procedure Laterality Date   APPENDECTOMY  age 83   CYSTOSCOPY N/A 03/21/2014    Procedure: CYSTOSCOPY TREATMENT OF BLADDER CALCULI;  Surgeon: [name], MD;  Location: [facility];  Service: Urology;  Laterality: N/A;   CYSTOSCOPY WITH BIOPSY N/A 12/29/2017   Procedure: TRANSURETHRAL RESECTION OF BLADDER TUMOR;  Surgeon: [name], MD;  Location: [facility];  Service: Urology;  Laterality: N/A;   CYSTOSCOPY WITH RETROGRADE PYELOGRAM, URETEROSCOPY AND STENT PLACEMENT Right 12/11/2017   Procedure: CYSTOSCOPY WITH RETROGRADE PYELOGRAM, URETEROSCOPY AND STENT PLACEMENT RIGHT;  Surgeon: [name], MD;  Location: [facility];  Service: Urology;  Laterality: Right;   CYSTOSCOPY WITH RETROGRADE PYELOGRAM, URETEROSCOPY AND STENT PLACEMENT Right 12/29/2017   Procedure: CYSTOSCOPY WITH RIGHT  RETROGRADE PYELOGRAM, URETEROSCOPY/LASER LITHOTRIPSY AND STENT PLACEMENT;  Surgeon: [name], MD;  Location: [facility];  Service: Urology;  Laterality: Right;   INSERTION OF SUPRAPUBIC CATHETER N/A 03/21/2014   Procedure: INSERTION OF SUPRAPUBIC CATHETER;  Surgeon: [name], MD;  Location: [facility];  Service: Urology;  Laterality: N/A;   NASAL SEPTUM SURGERY  1996   NEGATIVE SLEEP STUDY  1996   TRANSTHORACIC ECHOCARDIOGRAM  02-07-2004   MILD LVH/  EF 55-65%   HPI:  Patient is a 62 y.o male who presented on 06/01/23 with nausea, vomiting, and diarrhea for 4 days. He c/o malaise and poor PO intake. PMH is significant for MS, hypothyroidism, chronic constipation, GERD, and neurogenic bladder.   Assessment / Plan / Recommendation  Clinical Impression  Patient seen by SLP for cognitive-linguistic evaluation per patient report of recent confusion. Patient's eyes were closed when SLP entered the room, but opened to the sound of SLP voice. He remained awake, alert, and cooperative. No family was at bedside at the time of this evaluation. The patient lives at home with his sister, her husband, and her sister's stepson. His sister is his primary care  giver and is home with him for most of the day. His primary means of emergency contact, his cell phone, has not been working for the past 3-4 weeks. He does not have movement in his hands and was relying on voice activated devices before his cell phone and internet connection broke. He reported recent confusion and difficulty with word finding. He was orientated X 3 and partially orientated to situation, stating he was in the hospital due to his confusion. Impairments in word finding, thought organization, problem solving, and judgement were present at the time of this evaluation. His affect was flat and voice was monotone. Occasional articulatory imprecision, but this did not impact his intelligibility. SLP will plan to f/u at least once to provide patient/family education. Patient would benefit from home health ST after this hospitalization to provide resources and strategies for cognitive deficits.    SLP Assessment  SLP Recommendation/Assessment: Patient needs continued Speech Lanaguage Pathology Services SLP Visit Diagnosis: Cognitive communication deficit (R41.841)    Recommendations for follow up therapy are one component of a multi-disciplinary discharge planning process, led by the attending physician.  Recommendations may be updated based on patient status, additional functional criteria and insurance authorization.    Follow Up Recommendations  Home health SLP    Assistance Recommended at Discharge  Frequent or constant Supervision/Assistance  Functional Status Assessment Patient has had a recent decline in their functional status and demonstrates the ability to make significant improvements in function in a reasonable and predictable amount of time.  Frequency and Duration min 1 x/week  2 weeks      SLP Evaluation Cognition  Overall Cognitive Status: Impaired/Different from baseline Arousal/Alertness: Awake/alert Orientation Level: Oriented X4 Memory: Appears intact Awareness:  Appears intact Problem Solving: Impaired Problem Solving Impairment: Verbal basic;Functional basic Executive Function: Reasoning Reasoning: Impaired Reasoning Impairment: Verbal basic;Functional basic Safety/Judgment: Impaired       Comprehension  Auditory Comprehension Overall Auditory Comprehension: Appears within functional limits for tasks assessed Yes/No Questions: Within Functional Limits Commands: Not tested Conversation: Complex Visual Recognition/Discrimination Discrimination: Not tested Reading Comprehension Reading Status: Not tested    Expression Expression Primary Mode of Expression: Verbal Verbal Expression Overall Verbal Expression: Impaired Initiation: No impairment Level of Generative/Spontaneous Verbalization: Conversation Pragmatics: Impairment Impairments: Abnormal affect;Monotone   Oral / Motor  Oral Motor/Sensory Function Overall Oral Motor/Sensory Function: Within functional limits Motor Speech Overall Motor Speech: Appears within functional limits for tasks assessed Respiration: Within functional limits Phonation: Normal Resonance: Within functional limits Articulation: Within functional limitis Intelligibility: Intelligible Motor Planning: Witnin functional limits Motor Speech Errors: Not applicable            Marline Backbone, Senaida Lange., Speech Therapy Student   06/04/2023, 12:54 PM

## 2023-06-04 NOTE — Plan of Care (Signed)
  Problem: Education: Goal: Knowledge of General Education information will improve Description: Including pain rating scale, medication(s)/side effects and non-pharmacologic comfort measures 06/04/2023 0707 by Hortencia Pilar, RN Outcome: Progressing 06/04/2023 0706 by Hortencia Pilar, RN Outcome: Progressing   Problem: Health Behavior/Discharge Planning: Goal: Ability to manage health-related needs will improve 06/04/2023 0707 by Hortencia Pilar, RN Outcome: Progressing 06/04/2023 0706 by Hortencia Pilar, RN Outcome: Progressing   Problem: Clinical Measurements: Goal: Ability to maintain clinical measurements within normal limits will improve 06/04/2023 0707 by Hortencia Pilar, RN Outcome: Progressing 06/04/2023 0706 by Hortencia Pilar, RN Outcome: Progressing Goal: Will remain free from infection 06/04/2023 0707 by Hortencia Pilar, RN Outcome: Progressing 06/04/2023 0706 by Hortencia Pilar, RN Outcome: Progressing Goal: Diagnostic test results will improve 06/04/2023 0707 by Hortencia Pilar, RN Outcome: Progressing 06/04/2023 0706 by Hortencia Pilar, RN Outcome: Progressing Goal: Respiratory complications will improve 06/04/2023 0707 by Hortencia Pilar, RN Outcome: Progressing 06/04/2023 0706 by Hortencia Pilar, RN Outcome: Progressing Goal: Cardiovascular complication will be avoided 06/04/2023 0707 by Hortencia Pilar, RN Outcome: Progressing 06/04/2023 0706 by Hortencia Pilar, RN Outcome: Progressing   Problem: Activity: Goal: Risk for activity intolerance will decrease 06/04/2023 0707 by Hortencia Pilar, RN Outcome: Progressing 06/04/2023 0706 by Hortencia Pilar, RN Outcome: Progressing   Problem: Nutrition: Goal: Adequate nutrition will be maintained 06/04/2023 0707 by Hortencia Pilar, RN Outcome: Progressing 06/04/2023 0706 by Hortencia Pilar, RN Outcome: Progressing   Problem: Coping: Goal: Level of anxiety will decrease 06/04/2023 0707 by Hortencia Pilar, RN Outcome:  Progressing 06/04/2023 0706 by Hortencia Pilar, RN Outcome: Progressing   Problem: Elimination: Goal: Will not experience complications related to bowel motility 06/04/2023 0707 by Hortencia Pilar, RN Outcome: Progressing 06/04/2023 0706 by Hortencia Pilar, RN Outcome: Progressing Goal: Will not experience complications related to urinary retention 06/04/2023 0707 by Hortencia Pilar, RN Outcome: Progressing 06/04/2023 0706 by Hortencia Pilar, RN Outcome: Progressing   Problem: Pain Managment: Goal: General experience of comfort will improve 06/04/2023 0707 by Hortencia Pilar, RN Outcome: Progressing 06/04/2023 0706 by Hortencia Pilar, RN Outcome: Progressing   Problem: Safety: Goal: Ability to remain free from injury will improve 06/04/2023 0707 by Hortencia Pilar, RN Outcome: Progressing 06/04/2023 0706 by Hortencia Pilar, RN Outcome: Progressing

## 2023-06-05 DIAGNOSIS — E871 Hypo-osmolality and hyponatremia: Secondary | ICD-10-CM | POA: Diagnosis not present

## 2023-06-05 LAB — COMPREHENSIVE METABOLIC PANEL
ALT: 33 U/L (ref 0–44)
AST: 23 U/L (ref 15–41)
Albumin: 3.8 g/dL (ref 3.5–5.0)
Alkaline Phosphatase: 93 U/L (ref 38–126)
Anion gap: 10 (ref 5–15)
BUN: 15 mg/dL (ref 8–23)
CO2: 24 mmol/L (ref 22–32)
Calcium: 8.4 mg/dL — ABNORMAL LOW (ref 8.9–10.3)
Chloride: 95 mmol/L — ABNORMAL LOW (ref 98–111)
Creatinine, Ser: 0.69 mg/dL (ref 0.61–1.24)
GFR, Estimated: >60 mL/min (ref >60)
Glucose, Bld: 78 mg/dL (ref 70–99)
Potassium: 3.4 mmol/L — ABNORMAL LOW (ref 3.5–5.1)
Sodium: 129 mmol/L — ABNORMAL LOW (ref 135–145)
Total Bilirubin: 0.3 mg/dL (ref 0.3–1.2)
Total Protein: 6.6 g/dL (ref 6.5–8.1)

## 2023-06-05 LAB — CBC
HCT: 35.9 % — ABNORMAL LOW (ref 39.0–52.0)
Hemoglobin: 12.4 g/dL — ABNORMAL LOW (ref 13.0–17.0)
MCH: 29.5 pg (ref 26.0–34.0)
MCHC: 34.5 g/dL (ref 30.0–36.0)
MCV: 85.5 fL (ref 80.0–100.0)
Platelets: 211 10*3/uL (ref 150–400)
RBC: 4.2 MIL/uL — ABNORMAL LOW (ref 4.22–5.81)
RDW: 13.2 % (ref 11.5–15.5)
WBC: 17.7 10*3/uL — ABNORMAL HIGH (ref 4.0–10.5)
nRBC: 0 % (ref 0.0–0.2)

## 2023-06-05 LAB — URINE CULTURE

## 2023-06-05 MED ORDER — SODIUM CHLORIDE 0.9 % IV SOLN: 2.0000 g | INTRAVENOUS | Status: DC

## 2023-06-05 MED ORDER — POTASSIUM CHLORIDE CRYS ER 20 MEQ PO TBCR
60.0000 meq | EXTENDED_RELEASE_TABLET | Freq: Once | ORAL | Status: AC
  Administered 2023-06-05: 60 meq via ORAL
  Filled 2023-06-05: qty 3

## 2023-06-05 MED ORDER — SODIUM CHLORIDE 0.9 % IV SOLN: INTRAVENOUS | Status: AC

## 2023-06-05 MED ORDER — LISINOPRIL 40 MG PO TABS: 40.0000 mg | ORAL_TABLET | Freq: Every day | ORAL | 0 refills | Status: DC

## 2023-06-05 MED ORDER — METOPROLOL TARTRATE 25 MG PO TABS: 12.5000 mg | ORAL_TABLET | Freq: Two times a day (BID) | ORAL | 0 refills | Status: DC

## 2023-06-05 MED ORDER — METOPROLOL TARTRATE 12.5 MG HALF TABLET
12.5000 mg | ORAL_TABLET | Freq: Two times a day (BID) | ORAL | Status: DC
  Administered 2023-06-05: 12.5 mg via ORAL
  Filled 2023-06-05 (×2): qty 1

## 2023-06-05 MED ORDER — SODIUM CHLORIDE 0.9 % IV SOLN
2.0000 g | INTRAVENOUS | Status: DC
  Administered 2023-06-05 – 2023-06-06 (×2): 2 g via INTRAVENOUS
  Filled 2023-06-05 (×2): qty 20

## 2023-06-05 MED ORDER — SODIUM CHLORIDE 0.9 % IV BOLUS
1000.0000 mL | Freq: Once | INTRAVENOUS | Status: AC
  Administered 2023-06-05: 1000 mL via INTRAVENOUS

## 2023-06-05 NOTE — TOC Transition Note (Addendum)
Transition of Care [facility]) - CM/SW Discharge Note   Patient Details  Name: Samuel [name] MRN: [id number] Date of Birth: 11-16-1960  [facility] CM/SW Contact:  Darleene Cleaver, LCSW Phone Number: 06/05/2023, 10:38 AM   Clinical Narrative:     Patient will be discharging back home with his caregivers.  CSW was informed that patient will need EMS transport for around 2:30pm.  CSW contacted [facility] at 11:10am and set up transport for 2:30pm.  CSW updated bedside nurse.  TOC signing off, please reconsult if any TOC needs arise.  11:30am CSW informed by attending physician that patient is not medically ready for discharge today.  CSW contacted [facility] and cancelled EMS transportation.  TOC to continue to follow patient's progress throughout discharge planning.   Final next level of care: Home/Self Care Barriers to Discharge: Barriers Resolved   Patient Goals and CMS Choice      Discharge Placement  Patient plant so to return back home.  Per bedside nurse patient will need EMS transport set up.  CSW called [facility] at 11:11am to set up EMS transport through [facility] for around 2:30pm pickup.                         Discharge Plan and Services Additional resources added to the After Visit Summary for                                       Social Determinants of Health (SDOH) Interventions SDOH Screenings   Food Insecurity: No Food Insecurity (06/02/2023)  Housing: Low Risk  (06/02/2023)  Transportation Needs: No Transportation Needs (06/02/2023)  Utilities: Not At Risk (06/02/2023)  Tobacco Use: Medium Risk (06/01/2023)     Readmission Risk Interventions     No data to display

## 2023-06-05 NOTE — Progress Notes (Signed)
Since breakfast this AM, patient has been trending more tachycardic up to 130 bpm at rest. Dr Rhona Leavens notified and low dose oral metoprol was ordered. When RN rechecked BP to administer BB, SBP had dropped to low 90's. RN rechecked BP and it was 80/56. Oral metoprolol held by RN and Dr Rhona Leavens notified. Awaiting further orders at this time.

## 2023-06-05 NOTE — Discharge Summary (Addendum)
Physician Discharge Summary   Patient: Samuel [name] MRN: [id number] DOB: 07/16/1961  Admit date:     06/01/2023  Discharge date: 06/06/23  Discharge Physician: Rickey Barbara   PCP: Annita Brod, MD   Recommendations at discharge:    Follow up with PCP in 1-2 weeks Recommend repeat BMET in 1 week, focus on renal function and sodium level Please note, hydrochlorothiazide has been discontinued given concerns of dehydration and marked hyponatremia BP meds were held at time of discharge given concerns of labile BP. Pressures overall stable off BP meds. Recommend close outpt BP monitoring. If pt requires BP meds, then recommend avoiding HCTZ  Discharge Diagnoses: Principal Problem:   Hyponatremia  Resolved Problems:   * No resolved hospital problems. *  Hospital Course: 62 year old gentleman with a history of MS, hypothyroidism, chronic constipation, neurogenic bladder, who presented due to doing poorly over the last 3 to 4 days.  Associated with nausea, vomiting, abdominal distention, and constipation.  Lives with his sister currently.  They have a caregiver coming into the house   Workup revealed severe hyponatremia, was admitted by the critical care medicine team to the ICU.  After improvement of his serum sodium was transferred to hospitalist service on 06/03/2023.  Assessment and Plan: Severe hyponatremia with initial serum sodium of 108, multifactorial Secondary to nausea, vomiting, poor p.o. intake at home, and HCTZ effects Continue to hold off hydrochlorothiazide Cont IV fluid hydration per Nephrology Na improved to 131 by day of discharge   Refractory hypokalemia Refractory hypophosphatemia Replete intravenously.    Multiple sclerosis - cont home zanaflex, add back home baclofen, prn valium - cont supportive care   N/V, resolved Chronic varying constipation/ diarrhea Leukocytosis UA positive for pyuria Urine cx was inconclusive Completed empiric Rocephin    Hypothyroidism Continue home levothyroxine Recent TSH and free T4 within normal limits   Seizure disorder Continue home regimen  Hypertension/Hypotension -BP remained mostly stable during this hospitalization, did become hypotensive needing fluid bolus towards end of stay -Recommend holding further BP meds for now, recommend close outpt f/u for blood pressure monitoring. If bp meds are warranted, recommend avoiding hydrochlorothiazide in the future   GERD Continue home regimen       Consultants: Nephrology, PCCM Procedures performed:   Disposition: Home Diet recommendation:  Regular diet DISCHARGE MEDICATION: Allergies as of 06/06/2023       Reactions   Penicillins Other (See Comments)   > 20 yr ago, doesn't recall nature of reaction (didn't require hospitalization) Tolerates Ancef, Rocephin, and Cefepime per chart review Does not recall having taken amox/amp since initial rxn; none documented in Epic   Primaxin [imipenem] Other (See Comments)   Seizure         Medication List     STOP taking these medications    cloNIDine 0.1 MG tablet Commonly known as: CATAPRES   lisinopril-hydrochlorothiazide 20-25 MG tablet Commonly known as: ZESTORETIC   PROBIOTIC-10 PO       TAKE these medications    acetic acid 0.25 % irrigation Irrigate with as directed daily. Instill 30ml and clamp tube for 30 minutes then drain.   ARIPiprazole 5 MG tablet Commonly known as: ABILIFY Take 5 mg by mouth daily.   baclofen 20 MG tablet Commonly known as: LIORESAL Take 40 mg by mouth 3 (three) times daily.   buPROPion 300 MG 24 hr tablet Commonly known as: WELLBUTRIN XL Take 300 mg by mouth daily.   diazepam 5 MG tablet Commonly known as: VALIUM Take  5 mg by mouth daily.   levothyroxine 125 MCG tablet Commonly known as: SYNTHROID Take 125 mcg by mouth daily.   liver oil-zinc oxide 40 % ointment Commonly known as: DESITIN Apply 1 application topically as needed for  irritation.   loperamide 2 MG capsule Commonly known as: IMODIUM Take by mouth as needed for diarrhea or loose stools.   magnesium citrate Soln Take 0.5 Bottles by mouth daily as needed for moderate constipation or severe constipation.   MILK OF MAGNESIA PO Take 13 mLs by mouth as needed.   mirtazapine 30 MG tablet Commonly known as: REMERON Take 30 mg by mouth at bedtime.   Myrbetriq 50 MG Tb24 tablet Generic drug: mirabegron ER   omeprazole 20 MG capsule Commonly known as: PRILOSEC Take 20 mg by mouth every morning.   ondansetron 8 MG disintegrating tablet Commonly known as: ZOFRAN-ODT Take 8 mg by mouth 2 (two) times daily.   polyethylene glycol 17 g packet Commonly known as: MIRALAX / GLYCOLAX Take 17 g by mouth daily.   tiZANidine 4 MG tablet Commonly known as: ZANAFLEX Take 8 mg by mouth 3 (three) times daily.   topiramate 50 MG tablet Commonly known as: TOPAMAX Take 50 mg by mouth at bedtime.   traMADol 50 MG tablet Commonly known as: ULTRAM Take 50 mg by mouth every 6 (six) hours as needed for moderate pain.   vitamin A & D ointment Apply 1 application topically as needed for dry skin.   Vitamin D (Ergocalciferol) 1.25 MG (50000 UNIT) Caps capsule Commonly known as: DRISDOL Take 50,000 Units by mouth every 7 (seven) days. Friday only        Follow-up Information     Annita Brod, MD Follow up in 2 week(s).   Specialty: Internal Medicine Why: Hospital follow up Contact information: [address] [place] [address] [phone]                Discharge Exam: Ceasar Mons Weights   06/03/23 0500 06/04/23 0500 06/05/23 0446  Weight: 77.7 kg 77.7 kg 81.7 kg   General exam: Awake, laying in bed, in nad Respiratory system: Normal respiratory effort, no wheezing Cardiovascular system: regular rate, s1, s2 Gastrointestinal system: Soft, nondistended, positive BS Central nervous system: CN2-12 grossly intact, strength  intact Extremities: Perfused, no clubbing Skin: Normal skin turgor, no notable skin lesions seen Psychiatry: Mood normal // no visual hallucinations   Condition at discharge: fair  The results of significant diagnostics from this hospitalization (including imaging, microbiology, ancillary and laboratory) are listed below for reference.   Imaging Studies: CT ABDOMEN PELVIS W CONTRAST  Result Date: 06/02/2023 CLINICAL DATA:  Abdominal pain, acute, nonlocalized EXAM: CT ABDOMEN AND PELVIS WITH CONTRAST TECHNIQUE: Multidetector CT imaging of the abdomen and pelvis was performed using the standard protocol following bolus administration of intravenous contrast. RADIATION DOSE REDUCTION: This exam was performed according to the departmental dose-optimization program which includes automated exposure control, adjustment of the mA and/or kV according to patient size and/or use of iterative reconstruction technique. CONTRAST:  OMNIPAQUE IOHEXOL 300 MG/ML  SOLN COMPARISON:  04/20/2018 FINDINGS: Lower chest: Dependent atelectasis.  No effusions. Hepatobiliary: Diffuse low-density throughout the liver compatible with fatty infiltration. No focal abnormality. Gallbladder unremarkable. Pancreas: No focal abnormality or ductal dilatation. Spleen: No focal abnormality.  Normal size. Adrenals/Urinary Tract: Suprapubic catheter within the bladder which is decompressed. No renal or adrenal lesion. No hydronephrosis. Stomach/Bowel: Stomach, large and small bowel grossly unremarkable. Vascular/Lymphatic: No evidence of aneurysm  or adenopathy. Reproductive: No visible focal abnormality. Other: No free fluid or free air. Musculoskeletal: No acute bony abnormality. IMPRESSION: No acute findings in the abdomen or pelvis. Hepatic steatosis Suprapubic catheter within the bladder which is decompressed. Electronically Signed   By: Charlett Nose M.D.   On: 06/02/2023 01:47   DG CHEST PORT 1 VIEW  Result Date:  06/01/2023 CLINICAL DATA:  Shortness of breath.  CVC line placement. EXAM: PORTABLE CHEST 1 VIEW COMPARISON:  03/24/2023 FINDINGS: Heart size and pulmonary vascularity are normal. Lungs are clear. No pleural effusions. No pneumothorax. Mediastinal contours appear intact. No central venous catheter is demonstrated within the field of view. IMPRESSION: No active disease. Electronically Signed   By: Burman Nieves M.D.   On: 06/01/2023 20:03   [place] EKG SITE RITE  Result Date: 06/01/2023 If Site Rite image not attached, placement could not be confirmed due to current cardiac rhythm.   Microbiology: Results for orders placed or performed during the hospital encounter of 06/01/23  SARS Coronavirus 2 by RT PCR (hospital order, performed in [facility] hospital lab) *cepheid single result test* Anterior Nasal Swab     Status: None   Collection Time: 06/01/23  7:36 AM   Specimen: Anterior Nasal Swab  Result Value Ref Range Status   SARS Coronavirus 2 by RT PCR NEGATIVE NEGATIVE Final    Comment: (NOTE) SARS-CoV-2 target nucleic acids are NOT DETECTED.  The SARS-CoV-2 RNA is generally detectable in upper and lower respiratory specimens during the acute phase of infection. The lowest concentration of SARS-CoV-2 viral copies this assay can detect is 250 copies / mL. A negative result does not preclude SARS-CoV-2 infection and should not be used as the sole basis for treatment or other patient management decisions.  A negative result may occur with improper specimen collection / handling, submission of specimen other than nasopharyngeal swab, presence of viral mutation(s) within the areas targeted by this assay, and inadequate number of viral copies (<250 copies / mL). A negative result must be combined with clinical observations, patient history, and epidemiological information.  Fact Sheet for Patients:   RoadLapTop.co.za  Fact Sheet for Healthcare  Providers: http://kim-miller.com/  This test is not yet approved or  cleared by the [place] FDA and has been authorized for detection and/or diagnosis of SARS-CoV-2 by FDA under an Emergency Use Authorization (EUA).  This EUA will remain in effect (meaning this test can be used) for the duration of the COVID-19 declaration under Section 564(b)(1) of the Act, 21 U.S.C. section 360bbb-3(b)(1), unless the authorization is terminated or revoked sooner.  Performed at [facility], [address] [place], [place] [address]   MRSA Next Gen by PCR, Nasal     Status: None   Collection Time: 06/01/23  7:36 AM   Specimen: Nasal Mucosa; Nasal Swab  Result Value Ref Range Status   MRSA by PCR Next Gen NOT DETECTED NOT DETECTED Final    Comment: (NOTE) The GeneXpert MRSA Assay (FDA approved for NASAL specimens only), is one component of a comprehensive MRSA colonization surveillance program. It is not intended to diagnose MRSA infection nor to guide or monitor treatment for MRSA infections. Test performance is not FDA approved in patients less than 42 years old. Performed at [facility], [address] [place], [place] [address]   Urine Culture     Status: Abnormal   Collection Time: 06/03/23  5:16 PM   Specimen: Urine, Catheterized  Result Value Ref Range  Status   Specimen Description   Final    URINE, CATHETERIZED Performed at [facility], [address] [place], [place] [address]    Special Requests   Final    NONE Reflexed from [id number] Performed at [facility], [address] [place], [place] [address]    Culture MULTIPLE SPECIES PRESENT, SUGGEST RECOLLECTION (A)  Final   Report Status 06/05/2023 FINAL  Final    Labs: CBC: Recent Labs  Lab 06/01/23 1200 06/01/23 1713 06/02/23 0309 06/03/23 0517 06/05/23 0439 06/06/23 0254  WBC 14.8* 15.0* 15.8* 15.4* 17.7* 17.5*  NEUTROABS 12.8*   --   --   --   --   --   HGB 17.0 16.5 15.3 14.4 12.4* 12.1*  HCT 46.0 45.7 41.8 39.7 35.9* 37.5*  MCV 77.6* 79.5* 80.4 80.4 85.5 89.5  PLT 291 259 264 220 211 207   Basic Metabolic Panel: Recent Labs  Lab 06/01/23 1320 06/01/23 1713 06/02/23 0309 06/02/23 0740 06/02/23 2322 06/03/23 0311 06/03/23 0517 06/03/23 0750 06/03/23 1732 06/03/23 2007 06/04/23 0604 06/05/23 0439 06/06/23 0254  NA 109*   < > 116*   < > 114*  113*   < > 117*   < > 125* 126* 129* 129* 131*  K  --   --   --    < > 2.7*  --  2.8*  --  3.8  --  4.1 3.4* 3.8  CL  --   --   --    < > 75*  --  80*  --  90*  --  94* 95* 101  CO2  --   --   --    < > 27  --  28  --  23  --  25 24 20*  GLUCOSE  --   --   --    < > 151*  --  108*  --  116*  --  93 78 123*  BUN  --   --   --    < > 11  --  11  --  10  --  10 15 12   CREATININE  --   --   --    < > 0.83  --  0.84  --  0.61  --  0.60* 0.69 0.52*  CALCIUM  --   --   --    < > 8.4*  --  8.7*  --  8.2*  --  8.6* 8.4* 8.6*  MG 2.3  --  2.3  --   --   --   --   --  1.6*  --  2.3  --   --   PHOS  --   --  <1.0*  --  1.4*  --  1.2*  --  2.5  --  2.5  --   --    < > = values in this interval not displayed.   Liver Function Tests: Recent Labs  Lab 06/01/23 1200 06/02/23 2322 06/03/23 0517 06/05/23 0439 06/06/23 0254  AST 33  --   --  23 24  ALT 31  --   --  33 36  ALKPHOS 157*  --   --  93 89  BILITOT 1.6*  --   --  0.3 0.2*  PROT 8.8*  --   --  6.6 6.5  ALBUMIN 5.1* 3.9 4.1 3.8 3.6   CBG: No results for input(s): "GLUCAP" in the last 168 hours.  Discharge time  spent: less than 30 minutes.  Signed: Rickey Barbara, MD [facility] 06/06/2023

## 2023-06-05 NOTE — Plan of Care (Signed)

## 2023-06-05 NOTE — Plan of Care (Signed)

## 2023-06-05 NOTE — Progress Notes (Signed)
  Progress Note   Patient: Samuel [name] ZOX:[id number] DOB: May 30, 1961 DOA: 06/01/2023     4 DOS: the patient was seen and examined on 06/05/2023   Brief hospital course: 62 year old gentleman with a history of MS, hypothyroidism, chronic constipation, neurogenic bladder, who presented due to doing poorly over the last 3 to 4 days.  Associated with nausea, vomiting, abdominal distention, and constipation.  Lives with his sister currently.  They have a caregiver coming into the house   Workup revealed severe hyponatremia, was admitted by the critical care medicine team to the ICU.  After improvement of his serum sodium was transferred to hospitalist service on 06/03/2023.  Assessment and Plan: Severe hyponatremia with initial serum sodium of 108, multifactorial Secondary to nausea, vomiting, poor p.o. intake at home, and HCTZ effects Continue to hold off hydrochlorothiazide Cont IV fluid hydration per Nephrology. Saline continued at 75cc/hr Na stable at 129 today Recheck bmet in AM   Refractory hypokalemia Refractory hypophosphatemia Replete intravenously. Repeat levels in the morning.    Multiple sclerosis - cont home zanaflex, add back home baclofen, prn valium - cont supportive care   N/V, resolved Chronic varying constipation/ diarrhea Leukocytosis UA positive for pyuria Urine cx pending Given empiric Rocephin   Hypothyroidism Continue home levothyroxine Recent TSH and free T4 within normal limits   Seizure disorder Continue home regimen   GERD Continue home regimen  Hypotension -acute events noted this AM. Pt became tachycardic and hypotensive -responded well to 1L bolus of NS -Will cont on basal IVF per above      Subjective: Initially felt better today and was eager to go home. Later found to be hypotensive requiring IVF bolus  Physical Exam: Vitals:   06/05/23 1330 06/05/23 1400 06/05/23 1500 06/05/23 1542  BP: 127/62 (!) 150/79 (!) 163/77   Pulse: (!)  102 (!) 101 (!) 110 98  Resp: 17 15 16 18   Temp:    98.6 F (37 C)  TempSrc:    Oral  SpO2: 97% 100% 100% 99%  Weight:      Height:       General exam: Conversant, in no acute distress Respiratory system: normal chest rise, clear, no audible wheezing Cardiovascular system: regular rhythm, s1-s2 Gastrointestinal system: Nondistended, nontender, pos BS Central nervous system: No seizures, no tremors Extremities: No cyanosis, no joint deformities Skin: No rashes, no pallor Psychiatry: Affect normal // no auditory hallucinations   Data Reviewed:  Labs reviewed: Na 129, K 3.4, Cr 0.69, WBC 17.7   Family Communication: Pt in room, family not at bedside  Disposition: Status is: Inpatient Remains inpatient appropriate because: severity of illness  Planned Discharge Destination: Home     Author: Rickey Barbara, MD 06/05/2023 4:17 PM  For on call review [email or web address].

## 2023-06-06 LAB — COMPREHENSIVE METABOLIC PANEL
ALT: 36 U/L (ref 0–44)
AST: 24 U/L (ref 15–41)
Albumin: 3.6 g/dL (ref 3.5–5.0)
Alkaline Phosphatase: 89 U/L (ref 38–126)
Anion gap: 10 (ref 5–15)
BUN: 12 mg/dL (ref 8–23)
CO2: 20 mmol/L — ABNORMAL LOW (ref 22–32)
Calcium: 8.6 mg/dL — ABNORMAL LOW (ref 8.9–10.3)
Chloride: 101 mmol/L (ref 98–111)
Creatinine, Ser: 0.52 mg/dL — ABNORMAL LOW (ref 0.61–1.24)
GFR, Estimated: >60 mL/min (ref >60)
Glucose, Bld: 123 mg/dL — ABNORMAL HIGH (ref 70–99)
Potassium: 3.8 mmol/L (ref 3.5–5.1)
Sodium: 131 mmol/L — ABNORMAL LOW (ref 135–145)
Total Bilirubin: 0.2 mg/dL — ABNORMAL LOW (ref 0.3–1.2)
Total Protein: 6.5 g/dL (ref 6.5–8.1)

## 2023-06-06 LAB — CBC
HCT: 37.5 % — ABNORMAL LOW (ref 39.0–52.0)
Hemoglobin: 12.1 g/dL — ABNORMAL LOW (ref 13.0–17.0)
MCH: 28.9 pg (ref 26.0–34.0)
MCHC: 32.3 g/dL (ref 30.0–36.0)
MCV: 89.5 fL (ref 80.0–100.0)
Platelets: 207 10*3/uL (ref 150–400)
RBC: 4.19 MIL/uL — ABNORMAL LOW (ref 4.22–5.81)
RDW: 13.7 % (ref 11.5–15.5)
WBC: 17.5 10*3/uL — ABNORMAL HIGH (ref 4.0–10.5)
nRBC: 0 % (ref 0.0–0.2)

## 2023-06-06 MED ORDER — DIAZEPAM 5 MG PO TABS
5.0000 mg | ORAL_TABLET | Freq: Once | ORAL | Status: AC
  Administered 2023-06-06: 5 mg via ORAL

## 2023-06-06 NOTE — TOC Transition Note (Signed)
Transition of Care [facility]) - CM/SW Discharge Note   Patient Details  Name: Samuel [name] MRN: [id number] Date of Birth: [birth year]  [facility] CM/SW Contact:  Lavenia Atlas, RN Phone Number: 06/06/2023, 5:34 PM   Clinical Narrative:   Patient in for discharge from [facility] SDU. This RNCM spoke with patient's sister [name] to confirm someone will be home for patient's discharge today. [name] confirmed she's at home awaiting patient. PTAR called, PTAR folder with patient's chart. MD, RN notified.  No additional TOC needs at this time.      Final next level of care: Home/Self Care Barriers to Discharge: No Barriers Identified   Patient Goals and CMS Choice CMS Medicare.gov Compare Post Acute Care list provided to:: Patient Choice offered to / list presented to : Patient  Discharge Placement                    Name of family member notified: [name] Patient and family notified of of transfer: 06/06/23  Discharge Plan and Services Additional resources added to the After Visit Summary for                  DME Arranged: N/A DME Agency: NA       HH Arranged: NA HH Agency: NA        Social Determinants of Health (SDOH) Interventions SDOH Screenings   Food Insecurity: No Food Insecurity (06/02/2023)  Housing: Low Risk  (06/02/2023)  Transportation Needs: No Transportation Needs (06/02/2023)  Utilities: Not At Risk (06/02/2023)  Tobacco Use: Medium Risk (06/01/2023)     Readmission Risk Interventions    06/06/2023    5:09 PM  Readmission Risk Prevention Plan  Transportation Screening Complete  PCP or Specialist Appt within 5-7 Days Complete  Home Care Screening Complete  Medication Review (RN CM) Complete

## 2023-06-06 NOTE — Plan of Care (Signed)

## 2023-06-10 ENCOUNTER — Other Ambulatory Visit: Payer: Self-pay | Admitting: Diagnostic Neuroimaging

## 2023-06-13 ENCOUNTER — Other Ambulatory Visit: Payer: Self-pay | Admitting: Diagnostic Neuroimaging

## 2023-06-16 NOTE — Telephone Encounter (Signed)
Pharmacy called and LVM wanting to know if a refill can be authorized for the pt. Please advise.  [phone]  [phone]

## 2023-06-19 ENCOUNTER — Other Ambulatory Visit: Payer: Self-pay | Admitting: Neurology

## 2023-06-19 ENCOUNTER — Encounter: Payer: Self-pay | Admitting: Diagnostic Neuroimaging

## 2023-11-05 ENCOUNTER — Observation Stay (HOSPITAL_COMMUNITY)
Admission: EM | Admit: 2023-11-05 | Discharge: 2023-11-05 | Disposition: A | Discharge status: Home / Self Care | Attending: Internal Medicine | Admitting: Internal Medicine

## 2023-11-05 ENCOUNTER — Other Ambulatory Visit: Payer: Self-pay

## 2023-11-05 ENCOUNTER — Emergency Department (HOSPITAL_COMMUNITY)

## 2023-11-05 DIAGNOSIS — R0902 Hypoxemia: Secondary | ICD-10-CM | POA: Diagnosis not present

## 2023-11-05 DIAGNOSIS — Z1152 Encounter for screening for COVID-19: Secondary | ICD-10-CM | POA: Diagnosis not present

## 2023-11-05 DIAGNOSIS — Z87891 Personal history of nicotine dependence: Secondary | ICD-10-CM | POA: Diagnosis not present

## 2023-11-05 DIAGNOSIS — E039 Hypothyroidism, unspecified: Secondary | ICD-10-CM | POA: Diagnosis not present

## 2023-11-05 DIAGNOSIS — I10 Essential (primary) hypertension: Secondary | ICD-10-CM | POA: Insufficient documentation

## 2023-11-05 DIAGNOSIS — R0602 Shortness of breath: Secondary | ICD-10-CM | POA: Diagnosis present

## 2023-11-05 DIAGNOSIS — Z79899 Other long term (current) drug therapy: Secondary | ICD-10-CM | POA: Insufficient documentation

## 2023-11-05 DIAGNOSIS — J101 Influenza due to other identified influenza virus with other respiratory manifestations: Secondary | ICD-10-CM | POA: Diagnosis not present

## 2023-11-05 DIAGNOSIS — R410 Disorientation, unspecified: Secondary | ICD-10-CM | POA: Diagnosis present

## 2023-11-05 LAB — RESP PANEL BY RT-PCR (RSV, FLU A&B, COVID)  RVPGX2
Influenza A by PCR: POSITIVE — AB
Influenza B by PCR: NEGATIVE
Resp Syncytial Virus by PCR: NEGATIVE
SARS Coronavirus 2 by RT PCR: NEGATIVE

## 2023-11-05 LAB — CBC WITH DIFFERENTIAL/PLATELET
Abs Immature Granulocytes: 0.03 10*3/uL (ref 0.00–0.07)
Basophils Absolute: 0 10*3/uL (ref 0.0–0.1)
Basophils Relative: 0 %
Eosinophils Absolute: 0 10*3/uL (ref 0.0–0.5)
Eosinophils Relative: 0 %
HCT: 53.9 % — ABNORMAL HIGH (ref 39.0–52.0)
Hemoglobin: 17.3 g/dL — ABNORMAL HIGH (ref 13.0–17.0)
Immature Granulocytes: 1 %
Lymphocytes Relative: 15 %
Lymphs Abs: 0.9 10*3/uL (ref 0.7–4.0)
MCH: 28.3 pg (ref 26.0–34.0)
MCHC: 32.1 g/dL (ref 30.0–36.0)
MCV: 88.2 fL (ref 80.0–100.0)
Monocytes Absolute: 0.7 10*3/uL (ref 0.1–1.0)
Monocytes Relative: 12 %
Neutro Abs: 4.4 10*3/uL (ref 1.7–7.7)
Neutrophils Relative %: 72 %
Platelets: 243 10*3/uL (ref 150–400)
RBC: 6.11 MIL/uL — ABNORMAL HIGH (ref 4.22–5.81)
RDW: 13.5 % (ref 11.5–15.5)
WBC: 6.1 10*3/uL (ref 4.0–10.5)
nRBC: 0 % (ref 0.0–0.2)

## 2023-11-05 LAB — URINALYSIS, W/ REFLEX TO CULTURE (INFECTION SUSPECTED)
Bilirubin Urine: NEGATIVE
Glucose, UA: 50 mg/dL — AB
Ketones, ur: 20 mg/dL — AB
Nitrite: POSITIVE — AB
Protein, ur: 100 mg/dL — AB
Specific Gravity, Urine: >1.046 — ABNORMAL HIGH (ref 1.005–1.030)
pH: 5 (ref 5.0–8.0)

## 2023-11-05 LAB — COMPREHENSIVE METABOLIC PANEL
ALT: 16 U/L (ref 0–44)
AST: 20 U/L (ref 15–41)
Albumin: 4.5 g/dL (ref 3.5–5.0)
Alkaline Phosphatase: 150 U/L — ABNORMAL HIGH (ref 38–126)
Anion gap: 14 (ref 5–15)
BUN: 16 mg/dL (ref 8–23)
CO2: 21 mmol/L — ABNORMAL LOW (ref 22–32)
Calcium: 8.9 mg/dL (ref 8.9–10.3)
Chloride: 98 mmol/L (ref 98–111)
Creatinine, Ser: 1.53 mg/dL — ABNORMAL HIGH (ref 0.61–1.24)
GFR, Estimated: 51 mL/min — ABNORMAL LOW (ref >60)
Glucose, Bld: 182 mg/dL — ABNORMAL HIGH (ref 70–99)
Potassium: 3.2 mmol/L — ABNORMAL LOW (ref 3.5–5.1)
Sodium: 133 mmol/L — ABNORMAL LOW (ref 135–145)
Total Bilirubin: 0.7 mg/dL (ref 0.0–1.2)
Total Protein: 7.6 g/dL (ref 6.5–8.1)

## 2023-11-05 LAB — LIPASE, BLOOD: Lipase: 29 U/L (ref 11–51)

## 2023-11-05 LAB — LACTIC ACID, PLASMA: Lactic Acid, Venous: 1.7 mmol/L (ref 0.5–1.9)

## 2023-11-05 LAB — TSH: TSH: 0.73 u[IU]/mL (ref 0.350–4.500)

## 2023-11-05 LAB — MAGNESIUM: Magnesium: 2 mg/dL (ref 1.7–2.4)

## 2023-11-05 LAB — PROTIME-INR
INR: 1 (ref 0.8–1.2)
Prothrombin Time: 13.7 s (ref 11.4–15.2)

## 2023-11-05 LAB — APTT: aPTT: 33 s (ref 24–36)

## 2023-11-05 MED ORDER — SODIUM CHLORIDE 0.9 % IV BOLUS (SEPSIS)
500.0000 mL | Freq: Once | INTRAVENOUS | Status: AC
  Administered 2023-11-05: 500 mL via INTRAVENOUS

## 2023-11-05 MED ORDER — LACTATED RINGERS IV SOLN: INTRAVENOUS | Status: DC

## 2023-11-05 MED ORDER — VANCOMYCIN HCL IN DEXTROSE 1-5 GM/200ML-% IV SOLN
1000.0000 mg | Freq: Once | INTRAVENOUS | Status: DC
  Administered 2023-11-05: 1000 mg via INTRAVENOUS
  Filled 2023-11-05: qty 200

## 2023-11-05 MED ORDER — SODIUM CHLORIDE (PF) 0.9 % IJ SOLN
INTRAMUSCULAR | Status: AC
  Filled 2023-11-05: qty 50

## 2023-11-05 MED ORDER — ENOXAPARIN SODIUM 40 MG/0.4ML IJ SOSY: 40.0000 mg | PREFILLED_SYRINGE | INTRAMUSCULAR | Status: DC

## 2023-11-05 MED ORDER — SODIUM CHLORIDE 0.9 % IV SOLN
2.0000 g | Freq: Once | INTRAVENOUS | Status: AC
  Administered 2023-11-05: 2 g via INTRAVENOUS
  Filled 2023-11-05: qty 12.5

## 2023-11-05 MED ORDER — IOHEXOL 350 MG/ML SOLN
75.0000 mL | Freq: Once | INTRAVENOUS | Status: AC | PRN
  Administered 2023-11-05: 75 mL via INTRAVENOUS

## 2023-11-05 MED ORDER — LACTATED RINGERS IV BOLUS
1000.0000 mL | Freq: Once | INTRAVENOUS | Status: AC
  Administered 2023-11-05: 1000 mL via INTRAVENOUS

## 2023-11-05 NOTE — Discharge Summary (Signed)
 Physician Discharge Summary  [name]:[id number] DOB: 1961/04/19 DOA: 11/05/2023  PCP: Annita Brod, MD  Admit date: 11/05/2023 Discharge date: 11/05/2023  Admitted From: Home Disposition: Home  Recommendations for Outpatient Follow-up:  Follow up with PCP in 1-2 weeks Please obtain BMP/CBC in one week  Discharge Condition: Stable CODE STATUS: DNR Diet recommendation: As tolerated  Brief/Interim Summary: Samuel [name] is a 63 y.o. male with medical history significant of MS, bedbound at baseline, hypertension, hyperlipidemia, hypothyroidism, neurogenic bowel and bladder, GERD, depression/anxiety -patient presents from home with multiple sick contacts flu positive to our facility for worsening confusion and shortness of breath.  At intake patient was reported to be hypoxic per EMS, upon evaluation patient's mental status appears to be approaching baseline per sister and he is no longer hypoxic, satting well on room air.  He is unable to tolerate an ambulatory oxygen screen given his nonambulatory status but otherwise is without symptoms.  Patient did have CTA performed in the ED for concern over PE given his bedbound status and tachycardia which was noted to be unremarkable for any filling defects.   Lengthy discussion with patient and family, given his improvement and known wishes per his prior advance care documentation he would not want to be hospitalized unless absolutely necessary.  Given this information we will discharge patient back home to family for ongoing treatment and care.  No indication for Tamiflu at this time given he is well outside the window for treatment.  We did discuss flu vaccines as well as prophylactic Tamiflu in the future should family have recurrent episode of influenza but otherwise he is stable and agreeable for discharge home at this time,    Discharge Diagnoses:  Principal Problem:   Influenza A with respiratory manifestations    Discharge  Instructions   Allergies as of 11/05/2023       Reactions   Penicillins Other (See Comments)   > 20 yr ago, doesn't recall nature of reaction (didn't require hospitalization) Tolerates Ancef, Rocephin, and Cefepime per chart review Does not recall having taken amox/amp since initial rxn; none documented in Epic   Primaxin [imipenem] Other (See Comments)   Seizure         Medication List     STOP taking these medications    liver oil-zinc oxide 40 % ointment Commonly known as: DESITIN   Vitamin D (Ergocalciferol) 1.25 MG (50000 UNIT) Caps capsule Commonly known as: DRISDOL       TAKE these medications    acetic acid 0.25 % irrigation Irrigate with as directed daily. Instill 30ml and clamp tube for 30 minutes then drain.   ARIPiprazole 5 MG tablet Commonly known as: ABILIFY Take 5 mg by mouth daily.   baclofen 20 MG tablet Commonly known as: LIORESAL Take 40 mg by mouth 3 (three) times daily.   buPROPion 300 MG 24 hr tablet Commonly known as: WELLBUTRIN XL Take 300 mg by mouth daily.   diazepam 5 MG tablet Commonly known as: VALIUM Take 5 mg by mouth daily.   levothyroxine 125 MCG tablet Commonly known as: SYNTHROID Take 125 mcg by mouth daily.   loperamide 2 MG capsule Commonly known as: IMODIUM Take by mouth as needed for diarrhea or loose stools.   magnesium citrate Soln Take 0.5 Bottles by mouth daily as needed for moderate constipation or severe constipation.   MILK OF MAGNESIA PO Take 13 mLs by mouth as needed.   mirtazapine 30 MG tablet Commonly known as: REMERON  Take 30 mg by mouth at bedtime.   Myrbetriq 50 MG Tb24 tablet Generic drug: mirabegron ER   omeprazole 20 MG capsule Commonly known as: PRILOSEC Take 20 mg by mouth every morning.   ondansetron 8 MG disintegrating tablet Commonly known as: ZOFRAN-ODT Take 8 mg by mouth 2 (two) times daily.   polyethylene glycol 17 g packet Commonly known as: MIRALAX / GLYCOLAX Take 17 g by  mouth daily.   tiZANidine 4 MG tablet Commonly known as: ZANAFLEX Take 8 mg by mouth 3 (three) times daily.   topiramate 50 MG tablet Commonly known as: TOPAMAX Take 50 mg by mouth at bedtime.   traMADol 50 MG tablet Commonly known as: ULTRAM Take 50 mg by mouth every 6 (six) hours as needed for moderate pain.   vitamin A & D ointment Apply 1 application topically as needed for dry skin.        Allergies  Allergen Reactions   Penicillins Other (See Comments)    > 20 yr ago, doesn't recall nature of reaction (didn't require hospitalization) Tolerates Ancef, Rocephin, and Cefepime per chart review Does not recall having taken amox/amp since initial rxn; none documented in Epic    Primaxin [Imipenem] Other (See Comments)    Seizure     Consultations: None  Procedures/Studies: CT Angio Chest PE W and/or Wo Contrast Result Date: 11/05/2023 CLINICAL DATA:  Hypoxia.  Cough. EXAM: CT ANGIOGRAPHY CHEST WITH CONTRAST TECHNIQUE: Multidetector CT imaging of the chest was performed using the standard protocol during bolus administration of intravenous contrast. Multiplanar CT image reconstructions and MIPs were obtained to evaluate the vascular anatomy. RADIATION DOSE REDUCTION: This exam was performed according to the departmental dose-optimization program which includes automated exposure control, adjustment of the mA and/or kV according to patient size and/or use of iterative reconstruction technique. CONTRAST:  75mL OMNIPAQUE IOHEXOL 350 MG/ML SOLN COMPARISON:  None Available. FINDINGS: Cardiovascular: Satisfactory opacification of the pulmonary arteries to the segmental level. No evidence of pulmonary embolism. Normal heart size. No pericardial effusion. Mediastinum/Nodes: Thyroid gland is unremarkable. No adenopathy is noted. Moderate wall thickening of proximal and midportion of esophagus is noted suggesting esophagitis. Lungs/Pleura: No pneumothorax or pleural effusion is noted. Mild  ill-defined opacity is noted posteriorly in right upper lobe concerning for atelectasis or possibly infiltrate. Left lung is clear. Upper Abdomen: No acute abnormality. Musculoskeletal: No chest wall abnormality. No acute or significant osseous findings. Review of the MIP images confirms the above findings. IMPRESSION: No definite evidence of pulmonary embolus. Moderate wall thickening of proximal midportion of esophagus is noted suggesting esophagitis. Mild ill-defined opacity is noted posteriorly in right upper lobe concerning for atelectasis or possibly pneumonia. Electronically Signed   By: Lupita Raider M.D.   On: 11/05/2023 08:51   CT Head Wo Contrast Result Date: 11/05/2023 CLINICAL DATA:  63 year old male with altered mental status. Confusion. Flu like symptoms. EXAM: CT HEAD WITHOUT CONTRAST TECHNIQUE: Contiguous axial images were obtained from the base of the skull through the vertex without intravenous contrast. RADIATION DOSE REDUCTION: This exam was performed according to the departmental dose-optimization program which includes automated exposure control, adjustment of the mA and/or kV according to patient size and/or use of iterative reconstruction technique. COMPARISON:  Brain MRI 12/02/2013.  Head CT 10/12/2015. FINDINGS: Brain: Stable cerebral volume. No midline shift, ventriculomegaly, mass effect, evidence of mass lesion, intracranial hemorrhage or evidence of cortically based acute infarction. Gray-white matter differentiation preserved and within normal limits for age. Vascular: No suspicious intracranial  vascular hyperdensity. Calcified atherosclerosis at the skull base. Skull: No acute osseous abnormality identified. Sinuses/Orbits: Moderate paranasal sinus mucosal thickening and opacification mostly on the right side. Fluid level in the right frontal sinus. Mild right mastoid fluid, fusion also. Tympanic cavities, left mastoids remain well aerated. Other: No acute orbit or scalp soft  tissue finding. Chronic dystrophic calcification of the right lateral scalp series 4, image 34 is stable since 2017. IMPRESSION: 1. Normal for age noncontrast CT appearance of the brain. 2. Moderate paranasal sinus inflammation, mostly on the right side and with associated mild right mastoid effusion. Electronically Signed   By: Odessa Fleming M.D.   On: 11/05/2023 07:41   DG Chest Portable 1 View Result Date: 11/05/2023 CLINICAL DATA:  63 year old male with cough, altered mental status, flu like symptoms. EXAM: PORTABLE CHEST 1 VIEW COMPARISON:  Portable chest 06/01/2023 and earlier. FINDINGS: Portable AP semi upright view at 0616 hours. Substantially low lung volumes now. Mediastinal contours are stable and within normal limits. Visualized tracheal air column is within normal limits. No pneumothorax, pleural effusion, consolidation. Crowding of lung markings. No pulmonary edema. No confluent opacity. Negative visible bowel gas. No acute osseous abnormality identified. IMPRESSION: Very low lung volumes. Difficult to exclude viral/atypical respiratory infection associated interstitial changes. But no other acute cardiopulmonary abnormality. Electronically Signed   By: Odessa Fleming M.D.   On: 11/05/2023 06:59     Subjective: No acute issues or events since intake, requesting discharge home   Discharge Exam: Vitals:   11/05/23 0941 11/05/23 1100  BP:  (!) 176/99  Pulse:  (!) 102  Resp:  10  Temp: 98.9 F (37.2 C)   SpO2:  93%   Vitals:   11/05/23 0700 11/05/23 0800 11/05/23 0941 11/05/23 1100  BP: (!) 191/108 (!) 155/78  (!) 176/99  Pulse: (!) 107 (!) 122  (!) 102  Resp: 20 10  10   Temp:   98.9 F (37.2 C)   TempSrc:      SpO2: 90% 93%  93%    General: Pt is alert, awake, not in acute distress Cardiovascular: RRR, S1/S2 +, no rubs, no gallops Respiratory: CTA bilaterally, no wheezing, no rhonchi Abdominal: Soft, NT, ND, bowel sounds + Extremities: no edema, no cyanosis    The results of  significant diagnostics from this hospitalization (including imaging, microbiology, ancillary and laboratory) are listed below for reference.     Microbiology: Recent Results (from the past 240 hours)  Resp panel by RT-PCR (RSV, Flu A&B, Covid) Anterior Nasal Swab     Status: Abnormal   Collection Time: 11/05/23  6:03 AM   Specimen: Anterior Nasal Swab  Result Value Ref Range Status   SARS Coronavirus 2 by RT PCR NEGATIVE NEGATIVE Final    Comment: (NOTE) SARS-CoV-2 target nucleic acids are NOT DETECTED.  The SARS-CoV-2 RNA is generally detectable in upper respiratory specimens during the acute phase of infection. The lowest concentration of SARS-CoV-2 viral copies this assay can detect is 138 copies/mL. A negative result does not preclude SARS-Cov-2 infection and should not be used as the sole basis for treatment or other patient management decisions. A negative result may occur with  improper specimen collection/handling, submission of specimen other than nasopharyngeal swab, presence of viral mutation(s) within the areas targeted by this assay, and inadequate number of viral copies(<138 copies/mL). A negative result must be combined with clinical observations, patient history, and epidemiological information. The expected result is Negative.  Fact Sheet for Patients:  BloggerCourse.com  Fact  Sheet for Healthcare Providers:  SeriousBroker.it  This test is no t yet approved or cleared by the [place] FDA and  has been authorized for detection and/or diagnosis of SARS-CoV-2 by FDA under an Emergency Use Authorization (EUA). This EUA will remain  in effect (meaning this test can be used) for the duration of the COVID-19 declaration under Section 564(b)(1) of the Act, 21 U.S.C.section 360bbb-3(b)(1), unless the authorization is terminated  or revoked sooner.       Influenza A by PCR POSITIVE (A) NEGATIVE Final   Influenza  B by PCR NEGATIVE NEGATIVE Final    Comment: (NOTE) The Xpert Xpress SARS-CoV-2/FLU/RSV plus assay is intended as an aid in the diagnosis of influenza from Nasopharyngeal swab specimens and should not be used as a sole basis for treatment. Nasal washings and aspirates are unacceptable for Xpert Xpress SARS-CoV-2/FLU/RSV testing.  Fact Sheet for Patients: BloggerCourse.com  Fact Sheet for Healthcare Providers: SeriousBroker.it  This test is not yet approved or cleared by the [place] FDA and has been authorized for detection and/or diagnosis of SARS-CoV-2 by FDA under an Emergency Use Authorization (EUA). This EUA will remain in effect (meaning this test can be used) for the duration of the COVID-19 declaration under Section 564(b)(1) of the Act, 21 U.S.C. section 360bbb-3(b)(1), unless the authorization is terminated or revoked.     Resp Syncytial Virus by PCR NEGATIVE NEGATIVE Final    Comment: (NOTE) Fact Sheet for Patients: BloggerCourse.com  Fact Sheet for Healthcare Providers: SeriousBroker.it  This test is not yet approved or cleared by the [place] FDA and has been authorized for detection and/or diagnosis of SARS-CoV-2 by FDA under an Emergency Use Authorization (EUA). This EUA will remain in effect (meaning this test can be used) for the duration of the COVID-19 declaration under Section 564(b)(1) of the Act, 21 U.S.C. section 360bbb-3(b)(1), unless the authorization is terminated or revoked.  Performed at [facility], [address], [place], [place] [address]   Blood Culture (routine x 2)     Status: None (Preliminary result)   Collection Time: 11/05/23  6:53 AM   Specimen: BLOOD RIGHT HAND  Result Value Ref Range Status   Specimen Description   Final    BLOOD RIGHT HAND Performed at [facility], [address],  [place], [place] [address]    Special Requests   Final    BOTTLES DRAWN AEROBIC AND ANAEROBIC Blood Culture adequate volume Performed at [facility], [address], [place], [place] [address]    Culture PENDING  Incomplete   Report Status PENDING  Incomplete  Blood Culture (routine x 2)     Status: None (Preliminary result)   Collection Time: 11/05/23  7:01 AM   Specimen: BLOOD LEFT WRIST  Result Value Ref Range Status   Specimen Description   Final    BLOOD LEFT WRIST Performed at [facility], [address], [place], [place] [address]    Special Requests   Final    BOTTLES DRAWN AEROBIC AND ANAEROBIC Blood Culture adequate volume Performed at [facility], [address], [place], [place] [address]    Culture PENDING  Incomplete   Report Status PENDING  Incomplete     Labs: BNP (last 3 results) No results for input(s): "BNP" in the last 8760 hours. Basic Metabolic Panel: Recent Labs  Lab 11/05/23 0555  NA 133*  K 3.2*  CL 98  CO2 21*  GLUCOSE 182*  BUN 16  CREATININE 1.53*  CALCIUM 8.9  MG 2.0   Liver Function Tests: Recent Labs  Lab 11/05/23 0555  AST 20  ALT 16  ALKPHOS 150*  BILITOT 0.7  PROT 7.6  ALBUMIN 4.5   Recent Labs  Lab 11/05/23 0555  LIPASE 29   No results for input(s): "AMMONIA" in the last 168 hours. CBC: Recent Labs  Lab 11/05/23 0555  WBC 6.1  NEUTROABS 4.4  HGB 17.3*  HCT 53.9*  MCV 88.2  PLT 243   Cardiac Enzymes: No results for input(s): "CKTOTAL", "CKMB", "CKMBINDEX", "TROPONINI" in the last 168 hours. BNP: Invalid input(s): "POCBNP" CBG: No results for input(s): "GLUCAP" in the last 168 hours. D-Dimer No results for input(s): "DDIMER" in the last 72 hours. Hgb A1c No results for input(s): "HGBA1C" in the last 72 hours. Lipid Profile No results for input(s): "CHOL", "HDL", "LDLCALC", "TRIG", "CHOLHDL", "LDLDIRECT" in the last 72 hours. Thyroid function studies Recent Labs     11/05/23 0555  TSH 0.730   Anemia work up No results for input(s): "VITAMINB12", "FOLATE", "FERRITIN", "TIBC", "IRON", "RETICCTPCT" in the last 72 hours. Urinalysis    Component Value Date/Time   COLORURINE YELLOW 11/05/2023 0611   APPEARANCEUR CLOUDY (A) 11/05/2023 0611   LABSPEC >1.046 (H) 11/05/2023 0611   PHURINE 5.0 11/05/2023 0611   GLUCOSEU 50 (A) 11/05/2023 0611   HGBUR MODERATE (A) 11/05/2023 0611   BILIRUBINUR NEGATIVE 11/05/2023 0611   KETONESUR 20 (A) 11/05/2023 0611   PROTEINUR 100 (A) 11/05/2023 0611   UROBILINOGEN 0.2 06/02/2015 0459   NITRITE POSITIVE (A) 11/05/2023 0611   LEUKOCYTESUR LARGE (A) 11/05/2023 0611   Sepsis Labs Recent Labs  Lab 11/05/23 0555  WBC 6.1   Microbiology Recent Results (from the past 240 hours)  Resp panel by RT-PCR (RSV, Flu A&B, Covid) Anterior Nasal Swab     Status: Abnormal   Collection Time: 11/05/23  6:03 AM   Specimen: Anterior Nasal Swab  Result Value Ref Range Status   SARS Coronavirus 2 by RT PCR NEGATIVE NEGATIVE Final    Comment: (NOTE) SARS-CoV-2 target nucleic acids are NOT DETECTED.  The SARS-CoV-2 RNA is generally detectable in upper respiratory specimens during the acute phase of infection. The lowest concentration of SARS-CoV-2 viral copies this assay can detect is 138 copies/mL. A negative result does not preclude SARS-Cov-2 infection and should not be used as the sole basis for treatment or other patient management decisions. A negative result may occur with  improper specimen collection/handling, submission of specimen other than nasopharyngeal swab, presence of viral mutation(s) within the areas targeted by this assay, and inadequate number of viral copies(<138 copies/mL). A negative result must be combined with clinical observations, patient history, and epidemiological information. The expected result is Negative.  Fact Sheet for Patients:  BloggerCourse.com  Fact Sheet  for Healthcare Providers:  SeriousBroker.it  This test is no t yet approved or cleared by the [place] FDA and  has been authorized for detection and/or diagnosis of SARS-CoV-2 by FDA under an Emergency Use Authorization (EUA). This EUA will remain  in effect (meaning this test can be used) for the duration of the COVID-19 declaration under Section 564(b)(1) of the Act, 21 U.S.C.section 360bbb-3(b)(1), unless the authorization is terminated  or revoked sooner.       Influenza A by PCR POSITIVE (A) NEGATIVE Final   Influenza B by PCR NEGATIVE NEGATIVE Final    Comment: (NOTE) The Xpert Xpress SARS-CoV-2/FLU/RSV plus assay is intended as an  aid in the diagnosis of influenza from Nasopharyngeal swab specimens and should not be used as a sole basis for treatment. Nasal washings and aspirates are unacceptable for Xpert Xpress SARS-CoV-2/FLU/RSV testing.  Fact Sheet for Patients: BloggerCourse.com  Fact Sheet for Healthcare Providers: SeriousBroker.it  This test is not yet approved or cleared by the [place] FDA and has been authorized for detection and/or diagnosis of SARS-CoV-2 by FDA under an Emergency Use Authorization (EUA). This EUA will remain in effect (meaning this test can be used) for the duration of the COVID-19 declaration under Section 564(b)(1) of the Act, 21 U.S.C. section 360bbb-3(b)(1), unless the authorization is terminated or revoked.     Resp Syncytial Virus by PCR NEGATIVE NEGATIVE Final    Comment: (NOTE) Fact Sheet for Patients: BloggerCourse.com  Fact Sheet for Healthcare Providers: SeriousBroker.it  This test is not yet approved or cleared by the [place] FDA and has been authorized for detection and/or diagnosis of SARS-CoV-2 by FDA under an Emergency Use Authorization (EUA). This EUA will remain in effect  (meaning this test can be used) for the duration of the COVID-19 declaration under Section 564(b)(1) of the Act, 21 U.S.C. section 360bbb-3(b)(1), unless the authorization is terminated or revoked.  Performed at [facility], [address], [place], [place] [address]   Blood Culture (routine x 2)     Status: None (Preliminary result)   Collection Time: 11/05/23  6:53 AM   Specimen: BLOOD RIGHT HAND  Result Value Ref Range Status   Specimen Description   Final    BLOOD RIGHT HAND Performed at [facility], [address], [place], [place] [address]    Special Requests   Final    BOTTLES DRAWN AEROBIC AND ANAEROBIC Blood Culture adequate volume Performed at [facility], [address] [place], [place] [address]    Culture PENDING  Incomplete   Report Status PENDING  Incomplete  Blood Culture (routine x 2)     Status: None (Preliminary result)   Collection Time: 11/05/23  7:01 AM   Specimen: BLOOD LEFT WRIST  Result Value Ref Range Status   Specimen Description   Final    BLOOD LEFT WRIST Performed at [facility], [address], [place], [place] [address]    Special Requests   Final    BOTTLES DRAWN AEROBIC AND ANAEROBIC Blood Culture adequate volume Performed at [facility], [address] [place], [place] [address]    Culture PENDING  Incomplete   Report Status PENDING  Incomplete     Time coordinating discharge: Over 30 minutes  SIGNED:   Azucena Fallen, DO [facility] 11/05/2023, 12:20 PM Pager   If 7PM-7AM, please contact night-coverage [email or web address]

## 2023-11-05 NOTE — ED Notes (Signed)
 [facility] called for transport.

## 2023-11-05 NOTE — H&P (Signed)
 History and Physical    Samuel [name] Samuel [name] DOB: 25-Mar-1961 DOA: 11/05/2023  PCP: Annita Brod, MD   Chief Complaint: Confusion  HPI: Samuel [name] is a 63 y.o. male with medical history significant of MS, bedbound at baseline, hypertension, hyperlipidemia, hypothyroidism, neurogenic bowel and bladder, GERD, depression/anxiety -patient presents from home with multiple sick contacts flu positive to our facility for worsening confusion and shortness of breath.  At intake patient was reported to be hypoxic per EMS, upon evaluation patient's mental status appears to be approaching baseline per sister and he is no longer hypoxic, satting well on room air.  He is unable to tolerate an ambulatory oxygen screen given his nonambulatory status but otherwise is without symptoms.  Patient did have CTA performed in the ED for concern over PE given his bedbound status and tachycardia which was noted to be unremarkable for any filling defects.  Lengthy discussion with patient and family, given his improvement and known wishes per his prior advance care documentation he would not want to be hospitalized unless absolutely necessary.  Given this information we will discharge patient back home to family for ongoing treatment and care.  No indication for Tamiflu at this time given he is well outside the window for treatment.  We did discuss flu vaccines as well as prophylactic Tamiflu in the future should family have recurrent episode of influenza but otherwise he is stable and agreeable for discharge home at this time,  Review of Systems: As per HPI confusion and shortness of breath as above otherwise denies nausea vomiting headache fever chills chest pain.  Patient does have chronic diarrhea and constipation concomitantly.   Assessment/Plan Principal Problem:   Influenza A with respiratory manifestations    Influenza A with respiratory manifestations, transient hypoxia at intake, resolved  Acute  mental encephalopathy secondary to above, resolved  Patient's other chronic comorbid conditions appear to be stable  Code Status: DNR Family Communication: Sister updated over the phone Status is: Observation  Dispo: The patient is from: Home              Anticipated d/c is to: Home              Anticipated d/c date is: Today  Consultants:  None  Procedures:  None   Past Medical History:  Diagnosis Date   Anxiety    Bladder calculi    Chronic back pain    Chronic fatigue    Depression    Foley catheter in place    Gait disorder    GERD (gastroesophageal reflux disease)    History of kidney stones    Hyperlipidemia    Hypertension    Hypothyroidism    Incontinence of urine    Multiple sclerosis (HCC) NEUROLOGIST-- [name]   dx 1993, JC virus negative 2/14---  CURRENTLY RECEIVING TYSABRI IV TREATMENT   Neurogenic bladder    Neurogenic bowel    intermittant constipation and diarrhea   Pneumonia    dx  03-03-2014--  admitted for iv and oral antibiotics   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   Urinary retention    Vitamin D deficiency    Yeast infection    genital area    Past Surgical History:  Procedure Laterality Date   APPENDECTOMY  age 66   CYSTOSCOPY N/A 03/21/2014   Procedure: CYSTOSCOPY TREATMENT OF BLADDER CALCULI;  Surgeon: [name], MD;  Location: [facility];  Service: Urology;  Laterality: N/A;   CYSTOSCOPY WITH BIOPSY  N/A 12/29/2017   Procedure: TRANSURETHRAL RESECTION OF BLADDER TUMOR;  Surgeon: [name], MD;  Location: [facility];  Service: Urology;  Laterality: N/A;   CYSTOSCOPY WITH RETROGRADE PYELOGRAM, URETEROSCOPY AND STENT PLACEMENT Right 12/11/2017   Procedure: CYSTOSCOPY WITH RETROGRADE PYELOGRAM, URETEROSCOPY AND STENT PLACEMENT RIGHT;  Surgeon: [name], MD;  Location: [facility];  Service: Urology;  Laterality: Right;   CYSTOSCOPY WITH RETROGRADE PYELOGRAM, URETEROSCOPY AND STENT PLACEMENT Right 12/29/2017    Procedure: CYSTOSCOPY WITH RIGHT  RETROGRADE PYELOGRAM, URETEROSCOPY/LASER LITHOTRIPSY AND STENT PLACEMENT;  Surgeon: [name], MD;  Location: [facility];  Service: Urology;  Laterality: Right;   INSERTION OF SUPRAPUBIC CATHETER N/A 03/21/2014   Procedure: INSERTION OF SUPRAPUBIC CATHETER;  Surgeon: [name], MD;  Location: [facility];  Service: Urology;  Laterality: N/A;   NASAL SEPTUM SURGERY  1996   NEGATIVE SLEEP STUDY  1996   TRANSTHORACIC ECHOCARDIOGRAM  02-07-2004   MILD LVH/  EF 55-65%     reports that he quit smoking about 38 years ago. His smoking use included cigarettes. He started smoking about 46 years ago. He has a 12 pack-year smoking history. He has never used smokeless tobacco. He reports that he does not drink alcohol and does not use drugs.  Allergies  Allergen Reactions   Penicillins Other (See Comments)    > 20 yr ago, doesn't recall nature of reaction (didn't require hospitalization) Tolerates Ancef, Rocephin, and Cefepime per chart review Does not recall having taken amox/amp since initial rxn; none documented in Epic    Primaxin [Imipenem] Other (See Comments)    Seizure     Family History  Problem Relation Age of Onset   Stroke Father    Hypothyroidism Sister    Cirrhosis Brother     Prior to Admission medications   Medication Sig Start Date End Date Taking? Authorizing Provider  acetic acid 0.25 % irrigation Irrigate with as directed daily. Instill 30ml and clamp tube for 30 minutes then drain. 06/03/15   [name], MD  ARIPiprazole (ABILIFY) 5 MG tablet Take 5 mg by mouth daily.    [provider]  baclofen (LIORESAL) 20 MG tablet Take 40 mg by mouth 3 (three) times daily.    [provider]  buPROPion (WELLBUTRIN XL) 300 MG 24 hr tablet Take 300 mg by mouth daily.    [provider]  diazepam (VALIUM) 5 MG tablet Take 5 mg by mouth daily.    [provider]  levothyroxine (SYNTHROID,  LEVOTHROID) 125 MCG tablet Take 125 mcg by mouth daily. 05/22/15   [provider]  liver oil-zinc oxide (DESITIN) 40 % ointment Apply 1 application topically as needed for irritation. Patient not taking: Reported on 06/01/2023    [provider]  loperamide (IMODIUM) 2 MG capsule Take by mouth as needed for diarrhea or loose stools.    [provider]  magnesium citrate SOLN Take 0.5 Bottles by mouth daily as needed for moderate constipation or severe constipation.    [provider]  Magnesium Hydroxide (MILK OF MAGNESIA PO) Take 13 mLs by mouth as needed.    [provider]  mirtazapine (REMERON) 30 MG tablet Take 30 mg by mouth at bedtime.    [provider]  MYRBETRIQ 50 MG TB24 tablet  02/19/19   [provider]  omeprazole (PRILOSEC) 20 MG capsule Take 20 mg by mouth every morning.     [provider]  ondansetron (ZOFRAN-ODT) 8  MG disintegrating tablet Take 8 mg by mouth 2 (two) times daily. 05/30/23   [provider]  polyethylene glycol (MIRALAX / GLYCOLAX) packet Take 17 g by mouth daily. 04/24/18   [name], MD  tiZANidine (ZANAFLEX) 4 MG tablet Take 8 mg by mouth 3 (three) times daily. 05/13/23   [provider]  topiramate (TOPAMAX) 50 MG tablet Take 50 mg by mouth at bedtime.    [provider]  traMADol (ULTRAM) 50 MG tablet Take 50 mg by mouth every 6 (six) hours as needed for moderate pain.    [provider]  Vitamin D, Ergocalciferol, (DRISDOL) 50000 UNITS CAPS Take 50,000 Units by mouth every 7 (seven) days. Friday only Patient not taking: Reported on 06/01/2023    [provider]  Vitamins A & D (VITAMIN A & D) ointment Apply 1 application topically as needed for dry skin.    [provider]    Physical Exam: Vitals:   11/05/23 0700 11/05/23 0800 11/05/23 0941 11/05/23 1100  BP: (!) 191/108 (!) 155/78  (!) 176/99  Pulse: (!) 107 (!) 122  (!) 102   Resp: 20 10  10   Temp:   98.9 F (37.2 C)   TempSrc:      SpO2: 90% 93%  93%    Constitutional: NAD, calm, comfortable Vitals:   11/05/23 0700 11/05/23 0800 11/05/23 0941 11/05/23 1100  BP: (!) 191/108 (!) 155/78  (!) 176/99  Pulse: (!) 107 (!) 122  (!) 102  Resp: 20 10  10   Temp:   98.9 F (37.2 C)   TempSrc:      SpO2: 90% 93%  93%   General:  Pleasantly resting in bed, No acute distress. HEENT:  Normocephalic atraumatic.  Sclerae nonicteric, noninjected.  Extraocular movements intact bilaterally. Neck:  Without mass or deformity.  Trachea is midline. Lungs:  Clear to auscultate bilaterally without rhonchi, wheeze, or rales. Heart:  Regular rate and rhythm.  Without murmurs, rubs, or gallops. Abdomen:  Soft, nontender, nondistended.  Without guarding or rebound. Extremities: Without cyanosis, clubbing, edema, or obvious deformity. Vascular:  Dorsalis pedis and posterior tibial pulses palpable bilaterally. Skin:  Warm and dry, no erythema, no ulcerations.  Labs on Admission: I have personally reviewed following labs and imaging studies  CBC: Recent Labs  Lab 11/05/23 0555  WBC 6.1  NEUTROABS 4.4  HGB 17.3*  HCT 53.9*  MCV 88.2  PLT 243   Basic Metabolic Panel: Recent Labs  Lab 11/05/23 0555  NA 133*  K 3.2*  CL 98  CO2 21*  GLUCOSE 182*  BUN 16  CREATININE 1.53*  CALCIUM 8.9  MG 2.0   GFR: CrCl cannot be calculated (Unknown ideal weight.). Liver Function Tests: Recent Labs  Lab 11/05/23 0555  AST 20  ALT 16  ALKPHOS 150*  BILITOT 0.7  PROT 7.6  ALBUMIN 4.5   Recent Labs  Lab 11/05/23 0555  LIPASE 29   No results for input(s): "AMMONIA" in the last 168 hours. Coagulation Profile: Recent Labs  Lab 11/05/23 0555  INR 1.0   Cardiac Enzymes: No results for input(s): "CKTOTAL", "CKMB", "CKMBINDEX", "TROPONINI" in the last 168 hours. BNP (last 3 results) No results for input(s): "PROBNP" in the last 8760 hours. HbA1C: No results for  input(s): "HGBA1C" in the last 72 hours. CBG: No results for input(s): "GLUCAP" in the last 168 hours. Lipid Profile: No results for input(s): "CHOL", "HDL", "LDLCALC", "TRIG", "CHOLHDL", "LDLDIRECT" in the last 72 hours. Thyroid Function  Tests: Recent Labs    11/05/23 0555  TSH 0.730   Anemia Panel: No results for input(s): "VITAMINB12", "FOLATE", "FERRITIN", "TIBC", "IRON", "RETICCTPCT" in the last 72 hours. Urine analysis:    Component Value Date/Time   COLORURINE YELLOW 11/05/2023 0611   APPEARANCEUR CLOUDY (A) 11/05/2023 0611   LABSPEC >1.046 (H) 11/05/2023 0611   PHURINE 5.0 11/05/2023 0611   GLUCOSEU 50 (A) 11/05/2023 0611   HGBUR MODERATE (A) 11/05/2023 0611   BILIRUBINUR NEGATIVE 11/05/2023 0611   KETONESUR 20 (A) 11/05/2023 0611   PROTEINUR 100 (A) 11/05/2023 0611   UROBILINOGEN 0.2 06/02/2015 0459   NITRITE POSITIVE (A) 11/05/2023 0611   LEUKOCYTESUR LARGE (A) 11/05/2023 0611    Radiological Exams on Admission: CT Angio Chest PE W and/or Wo Contrast Result Date: 11/05/2023 CLINICAL DATA:  Hypoxia.  Cough. EXAM: CT ANGIOGRAPHY CHEST WITH CONTRAST TECHNIQUE: Multidetector CT imaging of the chest was performed using the standard protocol during bolus administration of intravenous contrast. Multiplanar CT image reconstructions and MIPs were obtained to evaluate the vascular anatomy. RADIATION DOSE REDUCTION: This exam was performed according to the departmental dose-optimization program which includes automated exposure control, adjustment of the mA and/or kV according to patient size and/or use of iterative reconstruction technique. CONTRAST:  75mL OMNIPAQUE IOHEXOL 350 MG/ML SOLN COMPARISON:  None Available. FINDINGS: Cardiovascular: Satisfactory opacification of the pulmonary arteries to the segmental level. No evidence of pulmonary embolism. Normal heart size. No pericardial effusion. Mediastinum/Nodes: Thyroid gland is unremarkable. No adenopathy is noted. Moderate wall  thickening of proximal and midportion of esophagus is noted suggesting esophagitis. Lungs/Pleura: No pneumothorax or pleural effusion is noted. Mild ill-defined opacity is noted posteriorly in right upper lobe concerning for atelectasis or possibly infiltrate. Left lung is clear. Upper Abdomen: No acute abnormality. Musculoskeletal: No chest wall abnormality. No acute or significant osseous findings. Review of the MIP images confirms the above findings. IMPRESSION: No definite evidence of pulmonary embolus. Moderate wall thickening of proximal midportion of esophagus is noted suggesting esophagitis. Mild ill-defined opacity is noted posteriorly in right upper lobe concerning for atelectasis or possibly pneumonia. Electronically Signed   By: Lupita Raider M.D.   On: 11/05/2023 08:51   CT Head Wo Contrast Result Date: 11/05/2023 CLINICAL DATA:  63 year old male with altered mental status. Confusion. Flu like symptoms. EXAM: CT HEAD WITHOUT CONTRAST TECHNIQUE: Contiguous axial images were obtained from the base of the skull through the vertex without intravenous contrast. RADIATION DOSE REDUCTION: This exam was performed according to the departmental dose-optimization program which includes automated exposure control, adjustment of the mA and/or kV according to patient size and/or use of iterative reconstruction technique. COMPARISON:  Brain MRI 12/02/2013.  Head CT 10/12/2015. FINDINGS: Brain: Stable cerebral volume. No midline shift, ventriculomegaly, mass effect, evidence of mass lesion, intracranial hemorrhage or evidence of cortically based acute infarction. Gray-white matter differentiation preserved and within normal limits for age. Vascular: No suspicious intracranial vascular hyperdensity. Calcified atherosclerosis at the skull base. Skull: No acute osseous abnormality identified. Sinuses/Orbits: Moderate paranasal sinus mucosal thickening and opacification mostly on the right side. Fluid level in the  right frontal sinus. Mild right mastoid fluid, fusion also. Tympanic cavities, left mastoids remain well aerated. Other: No acute orbit or scalp soft tissue finding. Chronic dystrophic calcification of the right lateral scalp series 4, image 34 is stable since 2017. IMPRESSION: 1. Normal for age noncontrast CT appearance of the brain. 2. Moderate paranasal sinus inflammation, mostly on the right side and with associated mild right  mastoid effusion. Electronically Signed   By: Odessa Fleming M.D.   On: 11/05/2023 07:41   DG Chest Portable 1 View Result Date: 11/05/2023 CLINICAL DATA:  63 year old male with cough, altered mental status, flu like symptoms. EXAM: PORTABLE CHEST 1 VIEW COMPARISON:  Portable chest 06/01/2023 and earlier. FINDINGS: Portable AP semi upright view at 0616 hours. Substantially low lung volumes now. Mediastinal contours are stable and within normal limits. Visualized tracheal air column is within normal limits. No pneumothorax, pleural effusion, consolidation. Crowding of lung markings. No pulmonary edema. No confluent opacity. Negative visible bowel gas. No acute osseous abnormality identified. IMPRESSION: Very low lung volumes. Difficult to exclude viral/atypical respiratory infection associated interstitial changes. But no other acute cardiopulmonary abnormality. Electronically Signed   By: Odessa Fleming M.D.   On: 11/05/2023 06:59    EKG: Independently reviewed.    Azucena Fallen DO [facility] For contact please use secure messenger on Epic  If 7PM-7AM, please contact night-coverage located on [email or web address]   11/05/2023, 12:15 PM

## 2023-11-05 NOTE — Progress Notes (Signed)
 Elink monitoring for the code sepsis protocol.

## 2023-11-05 NOTE — ED Provider Notes (Signed)
 Hx of MS and quadriparesis Sick contacts at home Coughing, with suprapubic catheter - getting infectious workup in ED  IV vanco + cefepime +Influenza positive  Per Dr Manus Gunning, pt pending CT PE study, UA - anticipating likely admission for influenze and/or bacterial infection with hypoxia    Physical Exam  BP (!) 155/78   Pulse (!) 122   Temp 98.8 F (37.1 C) (Oral)   Resp 10   SpO2 93%   Physical Exam  Procedures  Procedures  ED Course / MDM   Clinical Course as of 11/05/23 0836  Wed Nov 05, 2023  0734 Influenza A By PCR(!): POSITIVE [MT]  [phone] Admitted to hospitalist [MT]    Clinical Course User Index [MT] Nastacia Raybuck, Kermit Balo, MD   Medical Decision Making Amount and/or Complexity of Data Reviewed Labs: ordered. Decision-making details documented in ED Course. Radiology: ordered. ECG/medicine tests: ordered.  Risk Prescription drug management. Decision regarding hospitalization.   Patient reportedly 86% on room air per Dr Manus Gunning, now on 2-3L [place].  Admission for influenza hypoxia.  Hospitalist to follow up on CT PE study.       Terald Sleeper, MD 11/05/23 [phone]

## 2023-11-05 NOTE — ED Provider Notes (Signed)
 [place] EMERGENCY DEPARTMENT AT [facility] Provider Note   CSN: [id number] Arrival date & time: 11/05/23  0530     History  No chief complaint on file.   Samuel [name] is a 63 y.o. male.  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  Has spastic quadriparesis.  Has neurogenic bladder with suprapubic catheter in place.  Has hypertension and anxiety and asthma.  He is here from home with altered mental status confusion intermittent over the past several days.  States multiple sick contacts at home.  He has had poor appetite and intermittent confusion mostly in the mornings and improves throughout the day.  Does not think he had a fever.  Has had some congestion and mild nonproductive cough.  Has a history of hyponatremia in the past does not know if this is similar.  Denies abdominal pain but has no appetite.  Nausea but no vomiting.  No change in bowel habits.  Suprapubic catheter draining appropriately.  Denies any chest pain.  Has shortness of breath at baseline from his MS which is unchanged.  The history is provided by the patient and the EMS personnel.       Home Medications Prior to Admission medications   Medication Sig Start Date End Date Taking? Authorizing Provider  acetic acid 0.25 % irrigation Irrigate with as directed daily. Instill 30ml and clamp tube for 30 minutes then drain. 06/03/15   [name], MD  ARIPiprazole (ABILIFY) 5 MG tablet Take 5 mg by mouth daily.    [provider]  baclofen (LIORESAL) 20 MG tablet Take 40 mg by mouth 3 (three) times daily.    [provider]  buPROPion (WELLBUTRIN XL) 300 MG 24 hr tablet Take 300 mg by mouth daily.    [provider]  diazepam (VALIUM) 5 MG tablet Take 5 mg by mouth daily.    [provider]  levothyroxine (SYNTHROID, LEVOTHROID) 125 MCG tablet Take 125 mcg by mouth daily. 05/22/15   [provider]  liver oil-zinc oxide (DESITIN) 40 % ointment Apply 1 application topically as  needed for irritation. Patient not taking: Reported on 06/01/2023    [provider]  loperamide (IMODIUM) 2 MG capsule Take by mouth as needed for diarrhea or loose stools.    [provider]  magnesium citrate SOLN Take 0.5 Bottles by mouth daily as needed for moderate constipation or severe constipation.    [provider]  Magnesium Hydroxide (MILK OF MAGNESIA PO) Take 13 mLs by mouth as needed.    [provider]  mirtazapine (REMERON) 30 MG tablet Take 30 mg by mouth at bedtime.    [provider]  MYRBETRIQ 50 MG TB24 tablet  02/19/19   [provider]  omeprazole (PRILOSEC) 20 MG capsule Take 20 mg by mouth every morning.     [provider]  ondansetron (ZOFRAN-ODT) 8 MG disintegrating tablet Take 8 mg by mouth 2 (two) times daily. 05/30/23   [provider]  polyethylene glycol (MIRALAX / GLYCOLAX) packet Take 17 g by mouth daily. 04/24/18   [name], MD  tiZANidine (ZANAFLEX) 4 MG tablet Take 8 mg by mouth 3 (three) times daily. 05/13/23   [provider]  topiramate (TOPAMAX) 50 MG tablet Take 50 mg by mouth at bedtime.    [provider]  traMADol (ULTRAM) 50 MG tablet Take 50 mg by mouth every 6 (six) hours as needed for moderate pain.    [provider]  Vitamin  D, Ergocalciferol, (DRISDOL) 50000 UNITS CAPS Take 50,000 Units by mouth every 7 (seven) days. Friday only Patient not taking: Reported on 06/01/2023    [provider]  Vitamins A & D (VITAMIN A & D) ointment Apply 1 application topically as needed for dry skin.    [provider]      Allergies    Penicillins and Primaxin [imipenem]    Review of Systems   Review of Systems  Constitutional:  Positive for activity change, appetite change and fatigue.  HENT:  Negative for sore throat.   Respiratory:  Positive for cough and shortness of breath.   Gastrointestinal:  Negative for abdominal pain, nausea,  rectal pain and vomiting.  Genitourinary:  Negative for dysuria and hematuria.  Musculoskeletal:  Positive for arthralgias and myalgias.  Skin:  Positive for rash.  Neurological:  Positive for weakness.   all other systems are negative except as noted in the HPI and PMH.    Physical Exam Updated Vital Signs BP (!) 193/102 (BP Location: Left Arm)   Pulse (!) 123   Temp 98.8 F (37.1 C) (Oral)   Resp 20   SpO2 92%  Physical Exam Vitals and nursing note reviewed.  Constitutional:      General: He is not in acute distress.    Appearance: He is well-developed. He is ill-appearing.     Comments: Chronic ill-appearing  HENT:     Head: Normocephalic and atraumatic.     Mouth/Throat:     Pharynx: No oropharyngeal exudate.  Eyes:     Conjunctiva/sclera: Conjunctivae normal.     Pupils: Pupils are equal, round, and reactive to light.  Neck:     Comments: No meningismus. Cardiovascular:     Rate and Rhythm: Regular rhythm. Tachycardia present.     Heart sounds: Normal heart sounds. No murmur heard. Pulmonary:     Effort: Pulmonary effort is normal. No respiratory distress.     Breath sounds: Normal breath sounds.  Chest:     Chest wall: No tenderness.  Abdominal:     Palpations: Abdomen is soft.     Tenderness: There is no abdominal tenderness. There is no guarding or rebound.     Comments: Suprapubic catheter in place.  Musculoskeletal:        General: No tenderness. Normal range of motion.     Cervical back: Normal range of motion and neck supple.  Skin:    General: Skin is warm.     Findings: Rash present.     Comments: Diffuse erythema and petechiae involving bilateral arms, head, chest, abdomen.  Neurological:     Mental Status: He is alert and oriented to person, place, and time.     Motor: No abnormal muscle tone.     Comments: Bedbound at baseline.  Minimal movement of left arm at baseline.  Cannot move legs or right arm.  Psychiatric:        Behavior: Behavior  normal.     ED Results / Procedures / Treatments   Labs (all labs ordered are listed, but only abnormal results are displayed) Labs Reviewed  RESP PANEL BY RT-PCR (RSV, FLU A&B, COVID)  RVPGX2 - Abnormal; Notable for the following components:      Result Value   Influenza A by PCR POSITIVE (*)    All other components within normal limits  CBC WITH DIFFERENTIAL/PLATELET - Abnormal; Notable for the following components:   RBC 6.11 (*)    Hemoglobin 17.3 (*)  HCT 53.9 (*)    All other components within normal limits  COMPREHENSIVE METABOLIC PANEL - Abnormal; Notable for the following components:   Sodium 133 (*)    Potassium 3.2 (*)    CO2 21 (*)    Glucose, Bld 182 (*)    Creatinine, Ser 1.53 (*)    Alkaline Phosphatase 150 (*)    GFR, Estimated 51 (*)    All other components within normal limits  CULTURE, BLOOD (ROUTINE X 2)  CULTURE, BLOOD (ROUTINE X 2)  LIPASE, BLOOD  TSH  MAGNESIUM  PROTIME-INR  APTT  LACTIC ACID, PLASMA  URINALYSIS, W/ REFLEX TO CULTURE (INFECTION SUSPECTED)    EKG EKG Interpretation Date/Time:  Wednesday November 05 2023 05:46:37 EST Ventricular Rate:  114 PR Interval:  135 QRS Duration:  95 QT Interval:  375 QTC Calculation: 517 R Axis:   27  Text Interpretation: Sinus tachycardia LAE, consider biatrial enlargement Borderline T abnormalities, diffuse leads Prolonged QT interval No significant change was found Confirmed by Glynn Octave [phone]) on 11/05/2023 6:17:12 AM  Radiology CT Angio Chest PE W and/or Wo Contrast Result Date: 11/05/2023 CLINICAL DATA:  Hypoxia.  Cough. EXAM: CT ANGIOGRAPHY CHEST WITH CONTRAST TECHNIQUE: Multidetector CT imaging of the chest was performed using the standard protocol during bolus administration of intravenous contrast. Multiplanar CT image reconstructions and MIPs were obtained to evaluate the vascular anatomy. RADIATION DOSE REDUCTION: This exam was performed according to the departmental dose-optimization  program which includes automated exposure control, adjustment of the mA and/or kV according to patient size and/or use of iterative reconstruction technique. CONTRAST:  75mL OMNIPAQUE IOHEXOL 350 MG/ML SOLN COMPARISON:  None Available. FINDINGS: Cardiovascular: Satisfactory opacification of the pulmonary arteries to the segmental level. No evidence of pulmonary embolism. Normal heart size. No pericardial effusion. Mediastinum/Nodes: Thyroid gland is unremarkable. No adenopathy is noted. Moderate wall thickening of proximal and midportion of esophagus is noted suggesting esophagitis. Lungs/Pleura: No pneumothorax or pleural effusion is noted. Mild ill-defined opacity is noted posteriorly in right upper lobe concerning for atelectasis or possibly infiltrate. Left lung is clear. Upper Abdomen: No acute abnormality. Musculoskeletal: No chest wall abnormality. No acute or significant osseous findings. Review of the MIP images confirms the above findings. IMPRESSION: No definite evidence of pulmonary embolus. Moderate wall thickening of proximal midportion of esophagus is noted suggesting esophagitis. Mild ill-defined opacity is noted posteriorly in right upper lobe concerning for atelectasis or possibly pneumonia. Electronically Signed   By: Lupita Raider M.D.   On: 11/05/2023 08:51   CT Head Wo Contrast Result Date: 11/05/2023 CLINICAL DATA:  63 year old male with altered mental status. Confusion. Flu like symptoms. EXAM: CT HEAD WITHOUT CONTRAST TECHNIQUE: Contiguous axial images were obtained from the base of the skull through the vertex without intravenous contrast. RADIATION DOSE REDUCTION: This exam was performed according to the departmental dose-optimization program which includes automated exposure control, adjustment of the mA and/or kV according to patient size and/or use of iterative reconstruction technique. COMPARISON:  Brain MRI 12/02/2013.  Head CT 10/12/2015. FINDINGS: Brain: Stable cerebral volume.  No midline shift, ventriculomegaly, mass effect, evidence of mass lesion, intracranial hemorrhage or evidence of cortically based acute infarction. Gray-white matter differentiation preserved and within normal limits for age. Vascular: No suspicious intracranial vascular hyperdensity. Calcified atherosclerosis at the skull base. Skull: No acute osseous abnormality identified. Sinuses/Orbits: Moderate paranasal sinus mucosal thickening and opacification mostly on the right side. Fluid level in the right frontal sinus. Mild right mastoid fluid, fusion also.  Tympanic cavities, left mastoids remain well aerated. Other: No acute orbit or scalp soft tissue finding. Chronic dystrophic calcification of the right lateral scalp series 4, image 34 is stable since 2017. IMPRESSION: 1. Normal for age noncontrast CT appearance of the brain. 2. Moderate paranasal sinus inflammation, mostly on the right side and with associated mild right mastoid effusion. Electronically Signed   By: Odessa Fleming M.D.   On: 11/05/2023 07:41   DG Chest Portable 1 View Result Date: 11/05/2023 CLINICAL DATA:  63 year old male with cough, altered mental status, flu like symptoms. EXAM: PORTABLE CHEST 1 VIEW COMPARISON:  Portable chest 06/01/2023 and earlier. FINDINGS: Portable AP semi upright view at 0616 hours. Substantially low lung volumes now. Mediastinal contours are stable and within normal limits. Visualized tracheal air column is within normal limits. No pneumothorax, pleural effusion, consolidation. Crowding of lung markings. No pulmonary edema. No confluent opacity. Negative visible bowel gas. No acute osseous abnormality identified. IMPRESSION: Very low lung volumes. Difficult to exclude viral/atypical respiratory infection associated interstitial changes. But no other acute cardiopulmonary abnormality. Electronically Signed   By: Odessa Fleming M.D.   On: 11/05/2023 06:59    Procedures Procedures    Medications Ordered in ED Medications   lactated ringers infusion (has no administration in time range)  sodium chloride 0.9 % bolus 500 mL (has no administration in time range)  vancomycin (VANCOCIN) IVPB 1000 mg/200 mL premix (has no administration in time range)  ceFEPIme (MAXIPIME) 2 g in sodium chloride 0.9 % 100 mL IVPB (has no administration in time range)    ED Course/ Medical Decision Making/ A&P Clinical Course as of 11/05/23 0929  Wed Nov 05, 2023  0734 Influenza A By PCR(!): POSITIVE [MT]  [phone] Admitted to hospitalist [MT]    Clinical Course User Index [MT] Trifan, Kermit Balo, MD                                 Medical Decision Making Amount and/or Complexity of Data Reviewed Independent Historian: EMS Labs: ordered. Decision-making details documented in ED Course. Radiology: ordered and independent interpretation performed. Decision-making details documented in ED Course. ECG/medicine tests: ordered and independent interpretation performed. Decision-making details documented in ED Course.  Risk Prescription drug management. Decision regarding hospitalization.   Bedbound patient from home with intermittent confusion and altered mental status.  Tachycardic and hypertensive.  Borderline hypoxia in the mid 80s.  Placed on nasal cannula oxygen on arrival.  Septic workup pursued.  Will be judicious with fluids given his history of hyponatremia.  Will obtain blood cultures, chest x-ray and initiate broad-spectrum antibiotics.  CXR is poorly quality with possible basilar infiltrates.  Sodium is 133. Additional IVF given. AKI noted.   Continue IVF and antibiotics.  CT head and CTPE pending. Will need further evaluation of lung findings on CT.  Will likely need admission. Remains tachycardic.   Dr. Renaye Rakers to assume care.         Final Clinical Impression(s) / ED Diagnoses Final diagnoses:  Influenza A  Hypoxia    Rx / DC Orders ED Discharge Orders     None         Saheed Carrington, Jeannett Senior,  MD 11/05/23 [phone]

## 2023-11-05 NOTE — ED Triage Notes (Signed)
 Pt BIBA from home for intermittent confusion and flulike sx x3 days. Currently A&Ox4. Pt reports no appetite and everyone at home is sick. Has previously had similar with low sodium. Mild cough. 20ga lf. Hypertensive, pt states this happens sometimes.  [address]

## 2023-11-06 LAB — BLOOD CULTURE ID PANEL (REFLEXED) - BCID2

## 2023-11-06 NOTE — ED Notes (Signed)
 11/06/23 0745 Micro called with positive blood cultures 2 out of 4 bottles. Dr

## 2023-11-06 NOTE — Progress Notes (Signed)
 Pharmacy: Antimicrobial Stewardship Note  BCID results discussed with Dr. Natale Milch showing MRSE in 2 of 4 bottles. The patient presented with respiratory symptoms, +influenza. Dr. Natale Milch has low suspicion for bact infxn/disseminated infxn - will disregard for now as a contaminate.   Thank you for allowing pharmacy to be a part of this patient's care.  Georgina Pillion, PharmD, BCPS, BCIDP Infectious Diseases Clinical Pharmacist 11/06/2023 9:29 AM   **Pharmacist phone directory can now be found on [email or web address] ([email or web address]).  Listed under [facility].

## 2023-11-07 LAB — URINE CULTURE: Culture: >=100000 — AB

## 2023-11-08 LAB — CULTURE, BLOOD (ROUTINE X 2): Special Requests: ADEQUATE

## 2023-11-09 ENCOUNTER — Telehealth (HOSPITAL_BASED_OUTPATIENT_CLINIC_OR_DEPARTMENT_OTHER): Payer: Self-pay | Admitting: *Deleted

## 2023-11-09 NOTE — Telephone Encounter (Signed)
 Post ED Visit - Positive Culture Follow-up  Culture report reviewed by antimicrobial stewardship pharmacist: [facility] []  Enzo Bi, Pharm.D. []  Celedonio Miyamoto, Pharm.D., BCPS AQ-ID []  Garvin Fila, Pharm.D., BCPS []  Georgina Pillion, Pharm.D., BCPS []  [place], [address].D., BCPS, AAHIVP []  Estella Husk, Pharm.D., BCPS, AAHIVP []  Lysle Pearl, PharmD, BCPS []  Phillips Climes, PharmD, BCPS []  Agapito Games, PharmD, BCPS []  Verlan Friends, PharmD []  Mervyn Gay, PharmD, BCPS []  Vinnie Level, PharmD  [facility] []  Len Childs, PharmD []  Greer Pickerel, PharmD []  Adalberto Cole, PharmD []  Perlie Gold, Rph []  Lonell Face) Jean Rosenthal, PharmD []  Earl Many, PharmD []  Junita Push, PharmD []  Dorna Leitz, PharmD []  Terrilee Files, PharmD []  Lynann Beaver, PharmD []  Keturah Barre, PharmD []  Loralee Pacas, PharmD [x]  Cherylin Mylar, PharmD   Positive blood culture ID pharmacist discussed with Dr. Natale Milch and decided likely contaminant  and no further patient follow-up is required at this time.  Patsey Berthold 11/09/2023, 9:59 AM

## 2023-11-10 LAB — CULTURE, BLOOD (ROUTINE X 2)
Culture: NO GROWTH
Special Requests: ADEQUATE

## 2024-03-07 NOTE — Discharge Summary (Signed)
 [facility]  Discharge Summary  PCP: No primary care provider on file. Discharge Details   Admit date:         03/06/2024 Discharge date and time:       03/07/2024 Hospital LOS:    2  days  Active Hospital Problems   Diagnosis Date Noted POA  . *Nausea and vomiting, unspecified vomiting type 03/06/2024 Yes  . Multiple sclerosis (*) 03/06/2024 Unknown  . Hypertension 03/06/2024 Unknown  . Elevated troponin 03/06/2024 Unknown  . Hyponatremia 03/06/2024 Unknown  . Hypothyroid 03/06/2024 Unknown    Resolved Hospital Problems  No resolved problems to display.      Current Discharge Medication List     START taking these medications      Details  lisinopril  10 mg tablet Commonly known as: PRINIVIL ,ZESTRIL   Take one tablet (10 mg dose) by mouth 2 (two) times daily for 30 days. Quantity: 60 tablet       CONTINUE these medications which have NOT CHANGED      Details  acetaminophen  500 mg tablet Commonly known as: TYLENOL   Take one tablet (500 mg dose) by mouth every 6 (six) hours.   baclofen  20 mg tablet Commonly known as: LIORESAL   Take two tablets (40 mg dose) by mouth 3 (three) times a day.   cloNIDine 0.1 mg tablet Commonly known as: CATAPRES  Take one tablet (0.1 mg dose) by mouth 2 (two) times daily.   docusate sodium  50 mg capsule Commonly known as: COLACE  Take one capsule (50 mg dose) by mouth 2 (two) times daily.   levothyroxine  sodium 150 mcg tablet Commonly known as: SYNTHROID ,LEVOTHROID,LEVOXYL   one tablet (150 mcg dose).   lidocaine  HCl 2% gel Commonly known as: XYLOCAINE   Apply one Application topically 3 (three) times a day. Apply to legs and feet   mirtazapine  30 mg tablet Commonly known as: REMERON   Take one tablet (30 mg dose) by mouth daily.   MYRBETRIQ  50 MG 24 hr tablet Generic drug: mirabegron   Take one tablet (50 mg dose) by mouth daily.   omeprazole 20 mg  capsule Commonly known as: PRILOSEC  Take one capsule (20 mg dose) by mouth every morning.   ondansetron  8 mg disintegrating tablet Commonly known as: ZOFRAN -ODT  Take one tablet (8 mg dose) by mouth 2 (two) times daily. Quantity: 20 tablet   rOPINIRole 0.25 mg tablet Commonly known as: REQUIP  1 tablet 1 to 3 hours before bedtime Orally twice a day for 30 day(s)   tiZANidine  4 mg tablet Commonly known as: ZANAFLEX   Take two tablets (8 mg dose) by mouth 3 (three) times a day.   topiramate  50 MG tablet Commonly known as: TOPAMAX   Take one tablet (50 mg dose) by mouth at bedtime.   traMADol  50 mg tablet Commonly known as: ULTRAM   Take one tablet (50 mg dose) by mouth every 8 (eight) hours as needed.      * You might also be taking other medications not listed above. If you have questions about any of your other medications, talk to the person who prescribed them or your Primary Care Provider.          STOP taking these medications    sulfamethoxazole -trimethoprim  800-160 mg per tablet Commonly known as: BACTRIM  DS         Hospital Course   Indication for Admission/chief complaint: Nausea and vomiting  Hospital Course:        63 year old  male, history of MS who is bedbound at baseline, HTN, HLD, hypothyroidism, neurogenic bowel and bladder, GERD anxiety who presented to the ED via EMS for evaluation of nausea and vomiting.add Some swelling of the left arm.  Patient states that he was here several days ago, was diagnosed with UTI and discharged on oral antibiotics (appears to be Bactrim ).  However he developed nausea and vomiting.  There was also concern for the left arm swelling.  Patient has caregivers at home.  Workup in the ER showed that his blood pressure was somewhat elevated, increased to 186/103.  Troponins were elevated 50-56-85.  UA positive for ketones, nitrates, LE, bacteria and squamous epithelial cells.  WBC count 11.2. CT scan of the chest, abdomen  pelvis without any acute findings. Patient had a couple of episodes of vomiting in the ED.  It was decided to observe him overnight.  #1.  Nausea and vomiting.  As mentioned above patient CT scan of the abdomen did not show any acute findings.  Labs are relatively unremarkable as well.  Etiology not fully clear, however could be related to oral Bactrim  that he was prescribed recently for possible UTI. Findings of nitrites, leukocyte esterase and bacteria in the urine are likely colonization.  Recent urine culture grew multiple organisms of unknown significance. At this time we will discontinue antibiotics, clinically patient is afebrile and does not seem to have any obvious signs of systemic infection. His diet was advanced and he has tolerated diet well this morning.  Does not report any recurrence of nausea. Will discharge patient on Zofran  with outpatient follow-up with PCP.  #2.  Elevated troponins.  Likely related to elevated blood pressure, intractable nausea and vomiting, dehydration.  He did not endorse any symptoms of chest pains at all.  #3.  Neurogenic bladder/multiple sclerosis.  Continue home medications including muscle relaxants.  Patient already has caregivers at home.  #4.  Left arm swelling.  Ultrasound negative for DVT.  Recommendations to physicians/followup needed: Pcp,     Physical Exam: Vitals:   03/07/24 1200  BP: 116/62  Pulse: 70  Resp: 16  Temp: 98.2 F (36.8 C)  SpO2: 97%     Labs on Discharge:  Recent Labs    Units 03/07/24 0639 03/06/24 1356 03/03/24 1858  WBC thou/mcL 7.7 11.2* 6.8  HGB gm/dL 87.0* 86.4* 85.3  HCT % 38.8* 41.7 48.7  PLT thou/mcL 257 292  --    Recent Labs    Units 03/07/24 0639 03/06/24 1356 03/03/24 1858  NA mmol/L 136 132* 135*  K mmol/L 3.5* 3.8 4.4  CL mmol/L 99 95* 102  CO2 mmol/L 23 23 17*  BUN mg/dL 8 8 10   CREATININE mg/dL 9.38* 9.33* 9.26*  CALCIUM mg/dL 9.5 9.5 9.4   Recent Labs    Units 03/07/24 0639  03/06/24 1356 03/03/24 1858  BILITOT mg/dL 0.5 0.6 0.3  AST U/L 16 15 23   ALT U/L 11 11 13   ALKPHOS U/L 125 136 146  ALBUMIN gm/dL 4.1 4.4 3.9   No results for input(s): TSH, HGBA1C in the last 168 hours. Recent Labs    Units 03/06/24 1356  INR  1.0   No results for input(s): CHOL, LDL, HDL, TRIG in the last 168 hours. No results for input(s): TROPONIN, CK in the last 168 hours.  Invalid input(s): CK-MB    Post Hospital Care   Discharge Procedure Orders  Follow-up with Primary Care Physician  Referral Priority: Routine Referral Type: Consultation  Referral Reason:  Evaluate and Return  Number of Visits Requested: 1 Expiration Date: 09/02/24   Regular Diet   Notify Physician and CALL 911 if you experience Chest Pain or discomfort, especially if associated with tiredness, sick on stomach or sweaty feeling.   Notify Physician and Call 911 if you experience any of the following: Sudden numbness or weakness, sudden trouble speaking, vision problems, trouble walking, sudden severe headache with no known cause.   Activity as tolerated    Diet: Regular Diet Regular Diet  Potential for Rehab:        Fair Code Status:   No CPR Disposition: Home Consults:   Followup appointments: No future appointments.   Time spent in discharge process:  total time spent 35 minutes   Electronically signed: Mohsin DELENA Haley, MD 03/07/2024 / 4:04 PM   *Some images could not be shown.

## 2024-03-09 NOTE — Progress Notes (Signed)
 CARE COORDINATOR CONTACT WITH PATIENT/CAREGIVER AFTER HOSPITAL DISCHARGE   Patient contacted.  Encouraged to schedule Hospital follow up appointment with their Non NH PCP.
